# Patient Record
Sex: Female | Born: 1955 | Race: Black or African American | Hispanic: No | Smoking: Never smoker
Health system: Southern US, Community
[De-identification: ages and names within clinical notes are randomized; demographics above are authoritative.]

## PROBLEM LIST (undated history)

## (undated) DIAGNOSIS — M1990 Unspecified osteoarthritis, unspecified site: Secondary | ICD-10-CM

## (undated) DIAGNOSIS — D352 Benign neoplasm of pituitary gland: Secondary | ICD-10-CM

## (undated) DIAGNOSIS — G8929 Other chronic pain: Secondary | ICD-10-CM

## (undated) DIAGNOSIS — D351 Benign neoplasm of parathyroid gland: Secondary | ICD-10-CM

## (undated) DIAGNOSIS — Z803 Family history of malignant neoplasm of breast: Secondary | ICD-10-CM

## (undated) DIAGNOSIS — C73 Malignant neoplasm of thyroid gland: Secondary | ICD-10-CM

## (undated) DIAGNOSIS — E039 Hypothyroidism, unspecified: Secondary | ICD-10-CM

## (undated) DIAGNOSIS — B20 Human immunodeficiency virus [HIV] disease: Secondary | ICD-10-CM

## (undated) DIAGNOSIS — Z860100 Personal history of colon polyps, unspecified: Secondary | ICD-10-CM

## (undated) DIAGNOSIS — K648 Other hemorrhoids: Secondary | ICD-10-CM

## (undated) DIAGNOSIS — E0590 Thyrotoxicosis, unspecified without thyrotoxic crisis or storm: Secondary | ICD-10-CM

## (undated) DIAGNOSIS — Z87442 Personal history of urinary calculi: Secondary | ICD-10-CM

## (undated) DIAGNOSIS — M2550 Pain in unspecified joint: Secondary | ICD-10-CM

## (undated) DIAGNOSIS — F419 Anxiety disorder, unspecified: Secondary | ICD-10-CM

## (undated) DIAGNOSIS — M542 Cervicalgia: Secondary | ICD-10-CM

## (undated) DIAGNOSIS — Z8709 Personal history of other diseases of the respiratory system: Secondary | ICD-10-CM

## (undated) DIAGNOSIS — M549 Dorsalgia, unspecified: Secondary | ICD-10-CM

## (undated) DIAGNOSIS — G43909 Migraine, unspecified, not intractable, without status migrainosus: Secondary | ICD-10-CM

## (undated) DIAGNOSIS — Z21 Asymptomatic human immunodeficiency virus [HIV] infection status: Secondary | ICD-10-CM

## (undated) DIAGNOSIS — D649 Anemia, unspecified: Secondary | ICD-10-CM

## (undated) DIAGNOSIS — IMO0001 Reserved for inherently not codable concepts without codable children: Secondary | ICD-10-CM

## (undated) DIAGNOSIS — I10 Essential (primary) hypertension: Secondary | ICD-10-CM

## (undated) DIAGNOSIS — E785 Hyperlipidemia, unspecified: Secondary | ICD-10-CM

## (undated) DIAGNOSIS — Z531 Procedure and treatment not carried out because of patient's decision for reasons of belief and group pressure: Secondary | ICD-10-CM

## (undated) HISTORY — PX: COLONOSCOPY: SHX174

## (undated) HISTORY — DX: Family history of malignant neoplasm of breast: Z80.3

## (undated) HISTORY — DX: Benign neoplasm of parathyroid gland: D35.1

## (undated) HISTORY — PX: SALPINGOOPHORECTOMY: SHX82

## (undated) HISTORY — PX: BUNIONECTOMY: SHX129

## (undated) HISTORY — PX: TOE SURGERY: SHX1073

## (undated) HISTORY — DX: Human immunodeficiency virus (HIV) disease: B20

## (undated) HISTORY — DX: Malignant neoplasm of thyroid gland: C73

## (undated) HISTORY — DX: Essential (primary) hypertension: I10

## (undated) HISTORY — PX: CYSTOSCOPY: SUR368

## (undated) HISTORY — DX: Hyperlipidemia, unspecified: E78.5

## (undated) HISTORY — DX: Benign neoplasm of pituitary gland: D35.2

## (undated) HISTORY — DX: Asymptomatic human immunodeficiency virus (hiv) infection status: Z21

## (undated) HISTORY — PX: ABDOMINAL HYSTERECTOMY: SHX81

## (undated) HISTORY — PX: TONSILLECTOMY: SUR1361

## (undated) HISTORY — PX: HAMMER TOE SURGERY: SHX385

## (undated) HISTORY — PX: BREAST EXCISIONAL BIOPSY: SUR124

## (undated) HISTORY — PX: TUBAL LIGATION: SHX77

---

## 1997-10-20 ENCOUNTER — Emergency Department (HOSPITAL_COMMUNITY): Admission: EM | Admit: 1997-10-20 | Discharge: 1997-10-20 | Payer: Self-pay | Admitting: Emergency Medicine

## 1997-11-12 ENCOUNTER — Emergency Department (HOSPITAL_COMMUNITY): Admission: EM | Admit: 1997-11-12 | Discharge: 1997-11-12 | Payer: Self-pay | Admitting: Emergency Medicine

## 1998-08-08 ENCOUNTER — Other Ambulatory Visit: Admission: RE | Admit: 1998-08-08 | Discharge: 1998-08-08 | Payer: Self-pay | Admitting: Family Medicine

## 1998-08-29 ENCOUNTER — Ambulatory Visit (HOSPITAL_COMMUNITY): Admission: RE | Admit: 1998-08-29 | Discharge: 1998-08-29 | Payer: Self-pay | Admitting: Family Medicine

## 1999-04-02 ENCOUNTER — Other Ambulatory Visit: Admission: RE | Admit: 1999-04-02 | Discharge: 1999-04-02 | Payer: Self-pay | Admitting: Family Medicine

## 1999-10-07 ENCOUNTER — Encounter: Payer: Self-pay | Admitting: Family Medicine

## 1999-10-07 ENCOUNTER — Ambulatory Visit (HOSPITAL_COMMUNITY): Admission: RE | Admit: 1999-10-07 | Discharge: 1999-10-07 | Payer: Self-pay | Admitting: Family Medicine

## 1999-10-08 ENCOUNTER — Ambulatory Visit (HOSPITAL_COMMUNITY): Admission: RE | Admit: 1999-10-08 | Discharge: 1999-10-08 | Payer: Self-pay | Admitting: Family Medicine

## 1999-10-08 ENCOUNTER — Encounter: Payer: Self-pay | Admitting: Family Medicine

## 1999-11-14 ENCOUNTER — Ambulatory Visit: Admission: RE | Admit: 1999-11-14 | Discharge: 1999-11-14 | Payer: Self-pay | Admitting: Family Medicine

## 1999-11-14 ENCOUNTER — Encounter: Payer: Self-pay | Admitting: Family Medicine

## 1999-11-29 ENCOUNTER — Emergency Department (HOSPITAL_COMMUNITY): Admission: EM | Admit: 1999-11-29 | Discharge: 1999-11-29 | Payer: Self-pay | Admitting: Emergency Medicine

## 1999-12-04 ENCOUNTER — Encounter: Payer: Self-pay | Admitting: Obstetrics and Gynecology

## 1999-12-11 ENCOUNTER — Inpatient Hospital Stay (HOSPITAL_COMMUNITY): Admission: RE | Admit: 1999-12-11 | Discharge: 1999-12-13 | Payer: Self-pay | Admitting: Obstetrics and Gynecology

## 1999-12-11 ENCOUNTER — Encounter (INDEPENDENT_AMBULATORY_CARE_PROVIDER_SITE_OTHER): Payer: Self-pay | Admitting: Specialist

## 2000-04-27 ENCOUNTER — Encounter: Payer: Self-pay | Admitting: Family Medicine

## 2000-04-27 ENCOUNTER — Ambulatory Visit (HOSPITAL_COMMUNITY): Admission: RE | Admit: 2000-04-27 | Discharge: 2000-04-27 | Payer: Self-pay | Admitting: Family Medicine

## 2001-01-11 ENCOUNTER — Ambulatory Visit (HOSPITAL_BASED_OUTPATIENT_CLINIC_OR_DEPARTMENT_OTHER): Admission: RE | Admit: 2001-01-11 | Discharge: 2001-01-11 | Payer: Self-pay

## 2001-08-03 ENCOUNTER — Ambulatory Visit (HOSPITAL_COMMUNITY): Admission: RE | Admit: 2001-08-03 | Discharge: 2001-08-03 | Payer: Self-pay | Admitting: Family Medicine

## 2003-01-31 ENCOUNTER — Ambulatory Visit (HOSPITAL_COMMUNITY): Admission: RE | Admit: 2003-01-31 | Discharge: 2003-01-31 | Payer: Self-pay | Admitting: Internal Medicine

## 2003-09-01 ENCOUNTER — Ambulatory Visit (HOSPITAL_COMMUNITY): Admission: RE | Admit: 2003-09-01 | Discharge: 2003-09-01 | Payer: Self-pay | Admitting: Internal Medicine

## 2003-10-10 ENCOUNTER — Ambulatory Visit: Payer: Self-pay | Admitting: *Deleted

## 2003-10-26 ENCOUNTER — Ambulatory Visit: Payer: Self-pay | Admitting: Family Medicine

## 2003-10-30 ENCOUNTER — Ambulatory Visit (HOSPITAL_COMMUNITY): Admission: RE | Admit: 2003-10-30 | Discharge: 2003-10-30 | Payer: Self-pay | Admitting: Internal Medicine

## 2003-11-21 ENCOUNTER — Ambulatory Visit (HOSPITAL_COMMUNITY): Admission: RE | Admit: 2003-11-21 | Discharge: 2003-11-21 | Payer: Self-pay | Admitting: Internal Medicine

## 2003-12-05 ENCOUNTER — Ambulatory Visit: Payer: Self-pay | Admitting: Family Medicine

## 2003-12-08 ENCOUNTER — Ambulatory Visit: Payer: Self-pay | Admitting: Family Medicine

## 2003-12-19 ENCOUNTER — Ambulatory Visit: Payer: Self-pay | Admitting: Family Medicine

## 2004-02-08 ENCOUNTER — Ambulatory Visit: Payer: Self-pay | Admitting: Family Medicine

## 2004-03-28 ENCOUNTER — Ambulatory Visit: Payer: Self-pay | Admitting: Family Medicine

## 2004-07-02 ENCOUNTER — Ambulatory Visit: Payer: Self-pay | Admitting: Family Medicine

## 2004-08-06 ENCOUNTER — Ambulatory Visit: Payer: Self-pay | Admitting: Family Medicine

## 2004-08-13 ENCOUNTER — Ambulatory Visit: Payer: Self-pay | Admitting: Internal Medicine

## 2004-08-20 ENCOUNTER — Ambulatory Visit: Payer: Self-pay | Admitting: Family Medicine

## 2004-10-28 ENCOUNTER — Ambulatory Visit: Payer: Self-pay | Admitting: Family Medicine

## 2005-02-13 ENCOUNTER — Ambulatory Visit: Payer: Self-pay | Admitting: Family Medicine

## 2005-03-06 ENCOUNTER — Ambulatory Visit: Payer: Self-pay | Admitting: Internal Medicine

## 2005-03-14 ENCOUNTER — Emergency Department (HOSPITAL_COMMUNITY): Admission: EM | Admit: 2005-03-14 | Discharge: 2005-03-14 | Payer: Self-pay | Admitting: Emergency Medicine

## 2005-04-10 ENCOUNTER — Ambulatory Visit: Payer: Self-pay | Admitting: Internal Medicine

## 2005-05-14 ENCOUNTER — Ambulatory Visit: Payer: Self-pay | Admitting: Internal Medicine

## 2005-06-19 ENCOUNTER — Ambulatory Visit: Payer: Self-pay | Admitting: Internal Medicine

## 2005-07-31 ENCOUNTER — Ambulatory Visit: Payer: Self-pay | Admitting: Internal Medicine

## 2005-09-25 ENCOUNTER — Ambulatory Visit: Payer: Self-pay | Admitting: Internal Medicine

## 2005-09-29 ENCOUNTER — Ambulatory Visit (HOSPITAL_COMMUNITY): Admission: RE | Admit: 2005-09-29 | Discharge: 2005-09-29 | Payer: Self-pay | Admitting: Family Medicine

## 2005-10-07 ENCOUNTER — Encounter: Admission: RE | Admit: 2005-10-07 | Discharge: 2005-10-07 | Payer: Self-pay | Admitting: Family Medicine

## 2005-11-24 ENCOUNTER — Ambulatory Visit: Payer: Self-pay | Admitting: Internal Medicine

## 2006-03-09 ENCOUNTER — Ambulatory Visit: Payer: Self-pay | Admitting: Internal Medicine

## 2006-10-19 ENCOUNTER — Ambulatory Visit: Payer: Self-pay | Admitting: Internal Medicine

## 2006-10-19 LAB — CONVERTED CEMR LAB
AST: 15 units/L (ref 0–37)
Absolute CD4: 190 #/uL — ABNORMAL LOW (ref 381–1469)
Albumin: 4.2 g/dL (ref 3.5–5.2)
BUN: 7 mg/dL (ref 6–23)
CO2: 24 meq/L (ref 19–32)
Calcium: 11.4 mg/dL — ABNORMAL HIGH (ref 8.4–10.5)
Chloride: 106 meq/L (ref 96–112)
Cholesterol: 272 mg/dL — ABNORMAL HIGH (ref 0–200)
Creatinine, Ser: 0.72 mg/dL (ref 0.40–1.20)
HDL: 54 mg/dL (ref >39)
HIV 1 RNA Quant: 2880 copies/mL — ABNORMAL HIGH (ref <50)
Hemoglobin: 12.1 g/dL (ref 12.0–15.0)
Lymphocytes Relative: 32 % (ref 12–46)
Lymphs Abs: 0.9 10*3/uL (ref 0.7–3.3)
MCHC: 31.3 g/dL (ref 30.0–36.0)
Monocytes Absolute: 0.3 10*3/uL (ref 0.2–0.7)
Monocytes Relative: 10 % (ref 3–11)
Neutro Abs: 1.5 10*3/uL — ABNORMAL LOW (ref 1.7–7.7)
Neutrophils Relative %: 56 % (ref 43–77)
Potassium: 4.3 meq/L (ref 3.5–5.3)
RBC: 4.23 M/uL (ref 3.87–5.11)
TSH: 0.841 microintl units/mL (ref 0.350–5.50)
Total CHOL/HDL Ratio: 5
Total Lymphocytes %: 32 % (ref 12–46)
Total lymphocyte count: 864 cells/mcL (ref 700–3300)
WBC, lymph enumeration: 2.7 10*3/uL — ABNORMAL LOW (ref 4.0–10.5)
WBC: 2.7 10*3/uL — ABNORMAL LOW (ref 4.0–10.5)

## 2006-11-05 ENCOUNTER — Ambulatory Visit: Payer: Self-pay | Admitting: Internal Medicine

## 2006-11-30 ENCOUNTER — Ambulatory Visit: Payer: Self-pay | Admitting: Internal Medicine

## 2007-01-14 ENCOUNTER — Ambulatory Visit: Payer: Self-pay | Admitting: Family Medicine

## 2007-02-02 ENCOUNTER — Ambulatory Visit: Payer: Self-pay | Admitting: Internal Medicine

## 2007-02-02 LAB — CONVERTED CEMR LAB
ALT: 13 units/L (ref 0–35)
AST: 15 units/L (ref 0–37)
Absolute CD4: 331 #/uL — ABNORMAL LOW (ref 381–1469)
Albumin: 4.4 g/dL (ref 3.5–5.2)
Alkaline Phosphatase: 69 units/L (ref 39–117)
Basophils Absolute: 0 10*3/uL (ref 0.0–0.1)
Eosinophils Absolute: 0.1 10*3/uL (ref 0.0–0.7)
Eosinophils Relative: 2 % (ref 0–5)
HCT: 36.2 % (ref 36.0–46.0)
HIV 1 RNA Quant: 2000 copies/mL — ABNORMAL HIGH (ref <50)
HIV-1 RNA Quant, Log: 3.3 — ABNORMAL HIGH (ref <1.70)
LDL Cholesterol: 170 mg/dL — ABNORMAL HIGH (ref 0–99)
MCV: 94.5 fL (ref 78.0–100.0)
Neutrophils Relative %: 44 % (ref 43–77)
Platelets: 226 10*3/uL (ref 150–400)
Potassium: 5.2 meq/L (ref 3.5–5.3)
RDW: 14.3 % (ref 11.5–15.5)
Sodium: 143 meq/L (ref 135–145)
Total Bilirubin: 0.7 mg/dL (ref 0.3–1.2)
Total Protein: 8.2 g/dL (ref 6.0–8.3)
VLDL: 19 mg/dL (ref 0–40)
WBC, lymph enumeration: 3.5 10*3/uL — ABNORMAL LOW (ref 4.0–10.5)
WBC: 3.5 10*3/uL — ABNORMAL LOW (ref 4.0–10.5)

## 2007-02-11 ENCOUNTER — Ambulatory Visit: Payer: Self-pay | Admitting: Internal Medicine

## 2007-03-16 ENCOUNTER — Emergency Department (HOSPITAL_COMMUNITY): Admission: EM | Admit: 2007-03-16 | Discharge: 2007-03-16 | Payer: Self-pay | Admitting: Family Medicine

## 2007-03-29 ENCOUNTER — Ambulatory Visit: Payer: Self-pay | Admitting: Internal Medicine

## 2007-04-01 ENCOUNTER — Ambulatory Visit (HOSPITAL_COMMUNITY): Admission: RE | Admit: 2007-04-01 | Discharge: 2007-04-01 | Payer: Self-pay | Admitting: Internal Medicine

## 2007-08-12 ENCOUNTER — Ambulatory Visit: Payer: Self-pay | Admitting: Internal Medicine

## 2007-08-12 ENCOUNTER — Encounter: Payer: Self-pay | Admitting: Internal Medicine

## 2007-08-12 LAB — CONVERTED CEMR LAB
Albumin: 4.2 g/dL (ref 3.5–5.2)
Alkaline Phosphatase: 85 units/L (ref 39–117)
BUN: 11 mg/dL (ref 6–23)
Calcium: 10.9 mg/dL — ABNORMAL HIGH (ref 8.4–10.5)
Chloride: 104 meq/L (ref 96–112)
Eosinophils Absolute: 0.1 10*3/uL (ref 0.0–0.7)
Glucose, Bld: 88 mg/dL (ref 70–99)
HCT: 37.9 % (ref 36.0–46.0)
HIV-1 RNA Quant, Log: 3.72 — ABNORMAL HIGH (ref <1.70)
Hemoglobin: 11.9 g/dL — ABNORMAL LOW (ref 12.0–15.0)
Lymphs Abs: 1.2 10*3/uL (ref 0.7–4.0)
MCV: 89.4 fL (ref 78.0–100.0)
Monocytes Absolute: 0.2 10*3/uL (ref 0.1–1.0)
Monocytes Relative: 7 % (ref 3–12)
Neutrophils Relative %: 53 % (ref 43–77)
Potassium: 4.3 meq/L (ref 3.5–5.3)
RBC: 4.24 M/uL (ref 3.87–5.11)
WBC, lymph enumeration: 3.2 10*3/uL — ABNORMAL LOW (ref 4.0–10.5)
WBC: 3.2 10*3/uL — ABNORMAL LOW (ref 4.0–10.5)

## 2007-09-23 ENCOUNTER — Ambulatory Visit: Payer: Self-pay | Admitting: Gynecology

## 2007-09-23 ENCOUNTER — Encounter: Payer: Self-pay | Admitting: Gynecology

## 2007-09-23 ENCOUNTER — Other Ambulatory Visit: Admission: RE | Admit: 2007-09-23 | Discharge: 2007-09-23 | Payer: Self-pay | Admitting: Gynecology

## 2007-09-27 ENCOUNTER — Ambulatory Visit: Payer: Self-pay | Admitting: Gynecology

## 2007-10-01 ENCOUNTER — Ambulatory Visit: Payer: Self-pay | Admitting: Gynecology

## 2007-10-12 ENCOUNTER — Ambulatory Visit: Payer: Self-pay | Admitting: Gynecology

## 2007-10-15 ENCOUNTER — Ambulatory Visit (HOSPITAL_BASED_OUTPATIENT_CLINIC_OR_DEPARTMENT_OTHER): Admission: RE | Admit: 2007-10-15 | Discharge: 2007-10-15 | Payer: Self-pay | Admitting: Gynecology

## 2007-10-15 ENCOUNTER — Encounter: Payer: Self-pay | Admitting: Gynecology

## 2007-10-15 ENCOUNTER — Ambulatory Visit: Payer: Self-pay | Admitting: Gynecology

## 2007-11-01 ENCOUNTER — Ambulatory Visit: Payer: Self-pay | Admitting: Gynecology

## 2007-11-25 ENCOUNTER — Ambulatory Visit: Payer: Self-pay | Admitting: Internal Medicine

## 2008-01-03 ENCOUNTER — Ambulatory Visit: Payer: Self-pay | Admitting: Gynecology

## 2008-01-06 ENCOUNTER — Ambulatory Visit: Payer: Self-pay | Admitting: Internal Medicine

## 2008-01-06 LAB — CONVERTED CEMR LAB
ALT: 18 units/L (ref 0–35)
AST: 20 units/L (ref 0–37)
Absolute CD4: 237 #/uL — ABNORMAL LOW (ref 381–1469)
Basophils Absolute: 0 10*3/uL (ref 0.0–0.1)
Basophils Relative: 1 % (ref 0–1)
Creatinine, Ser: 0.7 mg/dL (ref 0.40–1.20)
Eosinophils Relative: 3 % (ref 0–5)
HCT: 38.4 % (ref 36.0–46.0)
HIV 1 RNA Quant: 5290 copies/mL — ABNORMAL HIGH (ref <48)
Hemoglobin: 12.1 g/dL (ref 12.0–15.0)
MCHC: 31.5 g/dL (ref 30.0–36.0)
Monocytes Absolute: 0.3 10*3/uL (ref 0.1–1.0)
RDW: 13.4 % (ref 11.5–15.5)
Total Bilirubin: 0.4 mg/dL (ref 0.3–1.2)
Total Lymphocytes %: 43 % (ref 12–46)
Total lymphocyte count: 1032 cells/mcL (ref 700–3300)

## 2008-02-10 ENCOUNTER — Telehealth: Payer: Self-pay | Admitting: Internal Medicine

## 2008-04-03 ENCOUNTER — Ambulatory Visit: Payer: Self-pay | Admitting: Internal Medicine

## 2008-04-03 ENCOUNTER — Encounter: Payer: Self-pay | Admitting: Internal Medicine

## 2008-04-03 DIAGNOSIS — D353 Benign neoplasm of craniopharyngeal duct: Secondary | ICD-10-CM

## 2008-04-03 DIAGNOSIS — F329 Major depressive disorder, single episode, unspecified: Secondary | ICD-10-CM

## 2008-04-03 DIAGNOSIS — E785 Hyperlipidemia, unspecified: Secondary | ICD-10-CM | POA: Insufficient documentation

## 2008-04-03 DIAGNOSIS — F3289 Other specified depressive episodes: Secondary | ICD-10-CM | POA: Insufficient documentation

## 2008-04-03 DIAGNOSIS — B20 Human immunodeficiency virus [HIV] disease: Secondary | ICD-10-CM | POA: Insufficient documentation

## 2008-04-03 DIAGNOSIS — B009 Herpesviral infection, unspecified: Secondary | ICD-10-CM | POA: Insufficient documentation

## 2008-04-03 DIAGNOSIS — D352 Benign neoplasm of pituitary gland: Secondary | ICD-10-CM | POA: Insufficient documentation

## 2008-04-03 DIAGNOSIS — R030 Elevated blood-pressure reading, without diagnosis of hypertension: Secondary | ICD-10-CM | POA: Insufficient documentation

## 2008-04-06 LAB — CONVERTED CEMR LAB
ALT: 18 units/L (ref 0–35)
AST: 18 units/L (ref 0–37)
Absolute CD4: 306 #/uL — ABNORMAL LOW (ref 381–1469)
Alkaline Phosphatase: 70 units/L (ref 39–117)
Basophils Absolute: 0 10*3/uL (ref 0.0–0.1)
Basophils Relative: 0 % (ref 0–1)
Chloride: 106 meq/L (ref 96–112)
Creatinine, Ser: 0.91 mg/dL (ref 0.40–1.20)
Eosinophils Absolute: 0 10*3/uL (ref 0.0–0.7)
HIV 1 RNA Quant: 3380 copies/mL — ABNORMAL HIGH (ref <48)
LDL Cholesterol: 181 mg/dL — ABNORMAL HIGH (ref 0–99)
Lymphs Abs: 1.3 10*3/uL (ref 0.7–4.0)
MCV: 88 fL (ref 78.0–100.0)
Neutrophils Relative %: 55 % (ref 43–77)
Platelets: 211 10*3/uL (ref 150–400)
RDW: 13.7 % (ref 11.5–15.5)
Total Bilirubin: 0.3 mg/dL (ref 0.3–1.2)
Total CHOL/HDL Ratio: 4.8
VLDL: 23 mg/dL (ref 0–40)
WBC: 3.6 10*3/uL — ABNORMAL LOW (ref 4.0–10.5)

## 2008-04-19 ENCOUNTER — Telehealth: Payer: Self-pay | Admitting: Internal Medicine

## 2008-07-03 ENCOUNTER — Encounter: Payer: Self-pay | Admitting: Internal Medicine

## 2008-07-03 ENCOUNTER — Ambulatory Visit: Payer: Self-pay | Admitting: Internal Medicine

## 2008-07-03 DIAGNOSIS — N63 Unspecified lump in unspecified breast: Secondary | ICD-10-CM | POA: Insufficient documentation

## 2008-07-05 ENCOUNTER — Encounter: Admission: RE | Admit: 2008-07-05 | Discharge: 2008-07-05 | Payer: Self-pay | Admitting: Internal Medicine

## 2008-07-05 ENCOUNTER — Telehealth: Payer: Self-pay | Admitting: Internal Medicine

## 2008-07-05 LAB — CONVERTED CEMR LAB
ALT: 16 units/L (ref 0–35)
AST: 18 units/L (ref 0–37)
Albumin: 4 g/dL (ref 3.5–5.2)
BUN: 13 mg/dL (ref 6–23)
CD4 T Helper %: 24 % — ABNORMAL LOW (ref 32–62)
CO2: 25 meq/L (ref 19–32)
Calcium: 11.4 mg/dL — ABNORMAL HIGH (ref 8.4–10.5)
Chloride: 107 meq/L (ref 96–112)
Creatinine, Ser: 0.83 mg/dL (ref 0.40–1.20)
Eosinophils Absolute: 0.1 10*3/uL (ref 0.0–0.7)
Eosinophils Relative: 2 % (ref 0–5)
HCT: 36.2 % (ref 36.0–46.0)
HIV 1 RNA Quant: 4300 copies/mL — ABNORMAL HIGH (ref <48)
Lymphocytes Relative: 36 % (ref 12–46)
Lymphs Abs: 1.5 10*3/uL (ref 0.7–4.0)
MCV: 88.7 fL (ref 78.0–100.0)
Monocytes Relative: 9 % (ref 3–12)
Potassium: 4.1 meq/L (ref 3.5–5.3)
RBC: 4.08 M/uL (ref 3.87–5.11)
WBC: 4.1 10*3/uL (ref 4.0–10.5)

## 2008-07-06 ENCOUNTER — Ambulatory Visit (HOSPITAL_COMMUNITY): Admission: RE | Admit: 2008-07-06 | Discharge: 2008-07-06 | Payer: Self-pay | Admitting: Internal Medicine

## 2008-07-19 ENCOUNTER — Encounter: Payer: Self-pay | Admitting: Internal Medicine

## 2008-07-27 ENCOUNTER — Encounter: Payer: Self-pay | Admitting: Internal Medicine

## 2008-07-27 ENCOUNTER — Ambulatory Visit: Payer: Self-pay | Admitting: Internal Medicine

## 2008-07-27 DIAGNOSIS — I10 Essential (primary) hypertension: Secondary | ICD-10-CM | POA: Insufficient documentation

## 2008-07-28 ENCOUNTER — Encounter: Payer: Self-pay | Admitting: Internal Medicine

## 2008-08-07 ENCOUNTER — Telehealth (INDEPENDENT_AMBULATORY_CARE_PROVIDER_SITE_OTHER): Payer: Self-pay | Admitting: *Deleted

## 2008-08-07 ENCOUNTER — Ambulatory Visit: Payer: Self-pay | Admitting: Internal Medicine

## 2008-09-25 ENCOUNTER — Encounter: Payer: Self-pay | Admitting: Internal Medicine

## 2008-09-25 ENCOUNTER — Ambulatory Visit: Payer: Self-pay | Admitting: Internal Medicine

## 2008-09-25 LAB — CONVERTED CEMR LAB
ALT: 15 units/L (ref 0–35)
Absolute CD4: 273 #/uL — ABNORMAL LOW (ref 381–1469)
BUN: 11 mg/dL (ref 6–23)
Basophils Relative: 1 % (ref 0–1)
CO2: 26 meq/L (ref 19–32)
Calcium: 10.7 mg/dL — ABNORMAL HIGH (ref 8.4–10.5)
Chloride: 108 meq/L (ref 96–112)
Cholesterol: 233 mg/dL — ABNORMAL HIGH (ref 0–200)
Creatinine, Ser: 0.83 mg/dL (ref 0.40–1.20)
Eosinophils Absolute: 0.1 10*3/uL (ref 0.0–0.7)
Eosinophils Relative: 2 % (ref 0–5)
Glucose, Bld: 84 mg/dL (ref 70–99)
HCT: 36.7 % (ref 36.0–46.0)
HIV-1 RNA Quant, Log: 3.67 — ABNORMAL HIGH (ref <1.68)
Lymphs Abs: 1.2 10*3/uL (ref 0.7–4.0)
MCHC: 31.1 g/dL (ref 30.0–36.0)
MCV: 90 fL (ref 78.0–100.0)
Monocytes Absolute: 0.3 10*3/uL (ref 0.1–1.0)
Monocytes Relative: 10 % (ref 3–12)
Neutrophils Relative %: 48 % (ref 43–77)
RBC: 4.08 M/uL (ref 3.87–5.11)
Total Bilirubin: 0.3 mg/dL (ref 0.3–1.2)
Total CHOL/HDL Ratio: 5.4
Triglycerides: 85 mg/dL (ref <150)
VLDL: 17 mg/dL (ref 0–40)
WBC: 3.1 10*3/uL — ABNORMAL LOW (ref 4.0–10.5)

## 2008-09-27 ENCOUNTER — Encounter: Payer: Self-pay | Admitting: Internal Medicine

## 2008-09-28 ENCOUNTER — Telehealth: Payer: Self-pay | Admitting: Internal Medicine

## 2008-12-25 ENCOUNTER — Ambulatory Visit: Payer: Self-pay | Admitting: Internal Medicine

## 2008-12-25 ENCOUNTER — Encounter: Payer: Self-pay | Admitting: Internal Medicine

## 2008-12-27 LAB — CONVERTED CEMR LAB
ALT: 14 units/L (ref 0–35)
AST: 16 units/L (ref 0–37)
Absolute CD4: 276 #/uL — ABNORMAL LOW (ref 381–1469)
Albumin: 3.9 g/dL (ref 3.5–5.2)
CD4 T Helper %: 20 % — ABNORMAL LOW (ref 32–62)
CO2: 27 meq/L (ref 19–32)
Calcium: 11.5 mg/dL — ABNORMAL HIGH (ref 8.4–10.5)
Chloride: 107 meq/L (ref 96–112)
Eosinophils Relative: 2 % (ref 0–5)
HCT: 38.1 % (ref 36.0–46.0)
HIV 1 RNA Quant: 6050 copies/mL — ABNORMAL HIGH (ref <48)
HIV-1 RNA Quant, Log: 3.78 — ABNORMAL HIGH (ref <1.68)
Lymphocytes Relative: 46 % (ref 12–46)
Lymphs Abs: 1.4 10*3/uL (ref 0.7–4.0)
Monocytes Relative: 11 % (ref 3–12)
Neutrophils Relative %: 40 % — ABNORMAL LOW (ref 43–77)
Platelets: 205 10*3/uL (ref 150–400)
Potassium: 5.4 meq/L — ABNORMAL HIGH (ref 3.5–5.3)
RBC: 4.22 M/uL (ref 3.87–5.11)
Total lymphocyte count: 1380 cells/mcL (ref 700–3300)
WBC: 3 10*3/uL — ABNORMAL LOW (ref 4.0–10.5)

## 2008-12-28 ENCOUNTER — Telehealth: Payer: Self-pay | Admitting: Internal Medicine

## 2009-01-08 ENCOUNTER — Encounter: Payer: Self-pay | Admitting: Internal Medicine

## 2009-01-08 ENCOUNTER — Ambulatory Visit: Payer: Self-pay | Admitting: Internal Medicine

## 2009-01-08 DIAGNOSIS — J069 Acute upper respiratory infection, unspecified: Secondary | ICD-10-CM | POA: Insufficient documentation

## 2009-01-11 ENCOUNTER — Encounter: Admission: RE | Admit: 2009-01-11 | Discharge: 2009-01-11 | Payer: Self-pay | Admitting: Internal Medicine

## 2009-05-18 ENCOUNTER — Ambulatory Visit: Payer: Self-pay | Admitting: Internal Medicine

## 2009-05-18 LAB — CONVERTED CEMR LAB
AST: 23 units/L (ref 0–37)
Albumin: 4.1 g/dL (ref 3.5–5.2)
Alkaline Phosphatase: 101 units/L (ref 39–117)
BUN: 11 mg/dL (ref 6–23)
Basophils Absolute: 0 10*3/uL (ref 0.0–0.1)
Eosinophils Relative: 2 % (ref 0–5)
HDL: 45 mg/dL (ref >39)
LDL Cholesterol: 173 mg/dL — ABNORMAL HIGH (ref 0–99)
Lymphocytes Relative: 36 % (ref 12–46)
Neutro Abs: 2.5 10*3/uL (ref 1.7–7.7)
Neutrophils Relative %: 55 % (ref 43–77)
Platelets: 249 10*3/uL (ref 150–400)
Potassium: 4.3 meq/L (ref 3.5–5.3)
RDW: 13.4 % (ref 11.5–15.5)
Total CHOL/HDL Ratio: 5.3

## 2009-05-30 ENCOUNTER — Encounter (INDEPENDENT_AMBULATORY_CARE_PROVIDER_SITE_OTHER): Payer: Self-pay | Admitting: *Deleted

## 2009-05-30 ENCOUNTER — Ambulatory Visit: Payer: Self-pay | Admitting: Internal Medicine

## 2009-07-30 ENCOUNTER — Encounter: Admission: RE | Admit: 2009-07-30 | Discharge: 2009-07-30 | Payer: Self-pay | Admitting: Internal Medicine

## 2009-08-01 ENCOUNTER — Encounter: Payer: Self-pay | Admitting: Internal Medicine

## 2009-08-07 ENCOUNTER — Encounter: Payer: Self-pay | Admitting: Internal Medicine

## 2009-08-22 ENCOUNTER — Ambulatory Visit: Payer: Self-pay | Admitting: Internal Medicine

## 2009-08-22 LAB — CONVERTED CEMR LAB
AST: 22 units/L (ref 0–37)
Albumin: 4.2 g/dL (ref 3.5–5.2)
Alkaline Phosphatase: 69 units/L (ref 39–117)
Basophils Absolute: 0 10*3/uL (ref 0.0–0.1)
Basophils Relative: 1 % (ref 0–1)
Eosinophils Absolute: 0.2 10*3/uL (ref 0.0–0.7)
MCHC: 32 g/dL (ref 30.0–36.0)
MCV: 89 fL (ref 78.0–100.0)
Neutrophils Relative %: 40 % — ABNORMAL LOW (ref 43–77)
Platelets: 199 10*3/uL (ref 150–400)
Potassium: 4.4 meq/L (ref 3.5–5.3)
Sodium: 138 meq/L (ref 135–145)
Total Protein: 7.7 g/dL (ref 6.0–8.3)
WBC: 2.8 10*3/uL — ABNORMAL LOW (ref 4.0–10.5)

## 2009-09-05 ENCOUNTER — Telehealth: Payer: Self-pay | Admitting: Internal Medicine

## 2009-09-07 ENCOUNTER — Telehealth (INDEPENDENT_AMBULATORY_CARE_PROVIDER_SITE_OTHER): Payer: Self-pay | Admitting: *Deleted

## 2009-09-09 ENCOUNTER — Emergency Department (HOSPITAL_COMMUNITY)
Admission: EM | Admit: 2009-09-09 | Discharge: 2009-09-09 | Payer: Self-pay | Source: Home / Self Care | Admitting: Emergency Medicine

## 2009-09-24 ENCOUNTER — Ambulatory Visit: Payer: Self-pay | Admitting: Internal Medicine

## 2009-09-24 ENCOUNTER — Ambulatory Visit (HOSPITAL_COMMUNITY): Admission: RE | Admit: 2009-09-24 | Discharge: 2009-09-24 | Payer: Self-pay | Admitting: Internal Medicine

## 2009-09-24 DIAGNOSIS — M79609 Pain in unspecified limb: Secondary | ICD-10-CM | POA: Insufficient documentation

## 2009-09-27 ENCOUNTER — Encounter: Payer: Self-pay | Admitting: Internal Medicine

## 2009-09-27 DIAGNOSIS — M899 Disorder of bone, unspecified: Secondary | ICD-10-CM | POA: Insufficient documentation

## 2009-09-27 DIAGNOSIS — M949 Disorder of cartilage, unspecified: Secondary | ICD-10-CM

## 2009-10-03 ENCOUNTER — Encounter: Admission: RE | Admit: 2009-10-03 | Discharge: 2009-10-03 | Payer: Self-pay | Admitting: Internal Medicine

## 2009-10-03 ENCOUNTER — Telehealth: Payer: Self-pay | Admitting: Internal Medicine

## 2009-10-03 ENCOUNTER — Encounter: Payer: Self-pay | Admitting: Internal Medicine

## 2009-11-08 ENCOUNTER — Ambulatory Visit: Payer: Self-pay | Admitting: Internal Medicine

## 2009-11-08 ENCOUNTER — Telehealth (INDEPENDENT_AMBULATORY_CARE_PROVIDER_SITE_OTHER): Payer: Self-pay | Admitting: *Deleted

## 2009-11-08 LAB — CONVERTED CEMR LAB
Alkaline Phosphatase: 87 units/L (ref 39–117)
Creatinine, Ser: 0.9 mg/dL (ref 0.40–1.20)
Eosinophils Absolute: 0.1 10*3/uL (ref 0.0–0.7)
Eosinophils Relative: 3 % (ref 0–5)
Glucose, Bld: 99 mg/dL (ref 70–99)
HCT: 37.4 % (ref 36.0–46.0)
HDL: 47 mg/dL (ref >39)
LDL Cholesterol: 182 mg/dL — ABNORMAL HIGH (ref 0–99)
Lymphs Abs: 1.6 10*3/uL (ref 0.7–4.0)
MCV: 88.8 fL (ref 78.0–100.0)
Monocytes Absolute: 0.3 10*3/uL (ref 0.1–1.0)
Platelets: 272 10*3/uL (ref 150–400)
RDW: 14 % (ref 11.5–15.5)
Sodium: 142 meq/L (ref 135–145)
Total Bilirubin: 0.3 mg/dL (ref 0.3–1.2)
Total CHOL/HDL Ratio: 5.3
Total Protein: 7.9 g/dL (ref 6.0–8.3)
Triglycerides: 103 mg/dL (ref <150)
VLDL: 21 mg/dL (ref 0–40)

## 2009-11-09 ENCOUNTER — Telehealth (INDEPENDENT_AMBULATORY_CARE_PROVIDER_SITE_OTHER): Payer: Self-pay | Admitting: *Deleted

## 2009-11-09 ENCOUNTER — Encounter: Payer: Self-pay | Admitting: Internal Medicine

## 2009-11-26 ENCOUNTER — Ambulatory Visit: Payer: Self-pay | Admitting: Internal Medicine

## 2010-01-10 ENCOUNTER — Encounter: Payer: Self-pay | Admitting: Internal Medicine

## 2010-01-10 ENCOUNTER — Ambulatory Visit
Admission: RE | Admit: 2010-01-10 | Discharge: 2010-01-10 | Payer: Self-pay | Source: Home / Self Care | Attending: Internal Medicine | Admitting: Internal Medicine

## 2010-01-10 LAB — CONVERTED CEMR LAB
Alkaline Phosphatase: 114 units/L (ref 39–117)
BUN: 13 mg/dL (ref 6–23)
Basophils Relative: 1 % (ref 0–1)
CO2: 28 meq/L (ref 19–32)
Creatinine, Ser: 0.84 mg/dL (ref 0.40–1.20)
Eosinophils Absolute: 0.1 10*3/uL (ref 0.0–0.7)
Glucose, Bld: 118 mg/dL — ABNORMAL HIGH (ref 70–99)
HCT: 38.5 % (ref 36.0–46.0)
HIV 1 RNA Quant: <20 copies/mL (ref <20)
Hemoglobin: 12.6 g/dL (ref 12.0–15.0)
LDL Cholesterol: 172 mg/dL — ABNORMAL HIGH (ref 0–99)
Lymphs Abs: 1.3 10*3/uL (ref 0.7–4.0)
MCHC: 32.7 g/dL (ref 30.0–36.0)
MCV: 90.6 fL (ref 78.0–100.0)
Monocytes Absolute: 0.2 10*3/uL (ref 0.1–1.0)
Monocytes Relative: 7 % (ref 3–12)
RBC: 4.25 M/uL (ref 3.87–5.11)
Sodium: 139 meq/L (ref 135–145)
Total Bilirubin: 0.3 mg/dL (ref 0.3–1.2)
Triglycerides: 107 mg/dL (ref <150)
VLDL: 21 mg/dL (ref 0–40)
WBC: 3.2 10*3/uL — ABNORMAL LOW (ref 4.0–10.5)

## 2010-01-11 LAB — T-HELPER CELL (CD4) - (RCID CLINIC ONLY)
CD4 % Helper T Cell: 25 % — ABNORMAL LOW (ref 33–55)
CD4 T Cell Abs: 390 uL — ABNORMAL LOW (ref 400–2700)

## 2010-01-24 ENCOUNTER — Ambulatory Visit
Admission: RE | Admit: 2010-01-24 | Discharge: 2010-01-24 | Payer: Self-pay | Source: Home / Self Care | Attending: Internal Medicine | Admitting: Internal Medicine

## 2010-01-26 ENCOUNTER — Encounter: Payer: Self-pay | Admitting: Internal Medicine

## 2010-01-26 ENCOUNTER — Encounter: Payer: Self-pay | Admitting: Family Medicine

## 2010-02-05 NOTE — Miscellaneous (Signed)
Summary: Office Visit ([facility])    Vital Signs:  Patient profile:   55 year old female Weight:      144.9 pounds Temp:     98.0 degrees F oral Pulse rate:   65 / minute Pulse rhythm:   regular Resp:     20 per minute BP sitting:   163 / 88  (left arm)  Vitals Entered By: Sharen Heck RN (January 08, 2009 3:54 PM)  History of Present Illness: Pt c/o head congestion and ear congestion and feeling dizzy.  She has tried OTC meds without much help. No fever or chills. No cough or sore throat.  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 1 Tablet Every Day 2)  Naproxen 500 Mg Tabs (Naproxen) .... Take 1 Tablet By Mouth Two Times A Day As Needed 3)  Tetracycline Hcl 500 Mg Caps (Tetracycline Hcl) .... Take 1 Capsule By Mouth Two Times A Day 4)  Augmentin 875-125 Mg Tabs (Amoxicillin-Pot Clavulanate) .... Take 1 Tablet By Mouth Two Times A Day  Allergies: 1)  ! Sulfa   Review of Systems  The patient denies anorexia, fever, weight loss, dyspnea on exertion, prolonged cough, and hemoptysis.     Physical Exam  General:  alert, well-developed, well-nourished, and well-hydrated.   Head:  normocephalic and atraumatic.   Ears:  bilateral TMs dull Mouth:  pharynx pink and moist.     Impression & Recommendations:  Problem # 1:  ACUTE SINUSITIS, UNSPECIFIED (ICD-461.9)  Will treat with augmentin. Her updated medication list for this problem includes:        Augmentin 875-125 Mg Tabs (Amoxicillin-pot clavulanate) .Marland Kitchen... Take 1 tablet by mouth two times a day  Orders: Est. Patient Level III  ([address])  Problem # 2:  HIV DISEASE (ICD-042) Pt currently does not want to be on antiretroviral medication. Diagnostics Reviewed:  WBC: 3.0 (12/25/2008)   Hgb: 12.5 (12/25/2008)   HCT: 38.1 (12/25/2008)   Platelets: 205 (12/25/2008) HIV-1 RNA: 6050 (12/25/2008)     Problem # 3:  HYPERLIPIDEMIA (ICD-272.4) pt refuses treatment.  Medications Added to Medication List This Visit: 1)  Augmentin 875-125 Mg Tabs (Amoxicillin-pot clavulanate) .... Take 1 tablet by mouth two times a day  Prescriptions: AUGMENTIN 875-125 MG TABS (AMOXICILLIN-POT CLAVULANATE) Take 1 tablet by mouth two times a day  #20 x 0   Entered and Authorized by:   Yisroel Ramming MD   Signed by:   Yisroel Ramming MD on 01/08/2009   Method used:   Print then Give to Patient   RxID:   [id number]

## 2010-02-05 NOTE — Assessment & Plan Note (Signed)
Summary: sinus infections & lab [mkj]   CC:  c/o sinus and head congestion, fever three days ago, and B/P has been elevated.  History of Present Illness: Pt c/o sinus congestion and ear pain for several days. She felt feverish and is having some nausea due to the drainage. She is taking sudafed without relief. She stopped taking her BP medication because it was making her feel tired.  Her Mom takes HCTZ so she would like to try that. I warned her that because she is sulfa allergic that she may react to the HCTZ.  Preventive Screening-Counseling & Management  Alcohol-Tobacco     Alcohol drinks/day: 0     Smoking Status: never  Caffeine-Diet-Exercise     Caffeine use/day: 1     Does Patient Exercise: yes     Type of exercise: cardio     Exercise (avg: min/session): <30     Times/week: 4  Safety-Violence-Falls     Seat Belt Use: yes      Sexual History:  n/a.    Comments: pt declined condoms   Updated Prior Medication List: NAPROXEN 500 MG TABS (NAPROXEN) Take 1 tablet by mouth two times a day as needed AMOXICILLIN 500 MG CAPS (AMOXICILLIN) Take 1 capsule by mouth three times a day HYDROCHLOROTHIAZIDE 25 MG TABS (HYDROCHLOROTHIAZIDE) Take 1 tablet by mouth once a day PROMETHAZINE HCL 25 MG TABS (PROMETHAZINE HCL) Take 1 tablet by mouth every 8 hours as needed  Current Allergies (reviewed today): ! SULFA Past History:  Past Medical History: Last updated: 04/03/2008 Depression HIV disease Hyperlipidemia  Social History: Sexual History:  n/a  Review of Systems  The patient denies anorexia, weight loss, dyspnea on exertion, and hemoptysis.    Vital Signs:  Patient profile:   55 year old female Height:      65 inches (165.10 cm) Weight:      142.12 pounds (64.60 kg) BMI:     23.74 Temp:     98.1 degrees F (36.72 degrees C) oral Pulse rate:   72 / minute BP sitting:   165 / 96  (right arm)  Vitals Entered By: Wendall Mola CMA Duncan Dull) (May 18, 2009 10:27  AM) CC: c/o sinus and head congestion, fever three days ago, B/P has been elevated Is Patient Diabetic? No Pain Assessment Patient in pain? yes     Location: head Intensity: 6 Type: pressure Onset of pain  Constant Nutritional Status BMI of 19 -24 = normal Nutritional Status Detail appetite "good"  Have you ever been in a relationship where you felt threatened, hurt or afraid?No   Does patient need assistance? Functional Status Self care Ambulation Normal   Physical Exam  General:  alert, well-developed, well-nourished, and well-hydrated.   Head:  normocephalic and atraumatic.   Ears:  R ear normal and L ear normal.   Mouth:  pharynx pink and moist, no erythema, and no exudates.   Lungs:  normal breath sounds.      Impression & Recommendations:  Problem # 1:  HIV DISEASE (ICD-042) Pt currently not on therapy per her wish. We will repeat labs today and call her with results. She will return in 3 months. Diagnostics Reviewed:  WBC: 3.0 (12/25/2008)   Hgb: 12.5 (12/25/2008)   HCT: 38.1 (12/25/2008)   Platelets: 205 (12/25/2008) HIV-1 RNA: 6050 (12/25/2008)     Problem # 2:  ACUTE SINUSITIS, UNSPECIFIED (ICD-461.9) Will treat with amoxacillin. Pt to avoid decongestants due to her HTN.  Problem # 3:  ESSENTIAL HYPERTENSION, BENIGN (ICD-401.1) Will try HCTZ but pt warned about the potential for allergic reaction. The following medications were removed from the medication list:    Atenolol 100 Mg Tabs (Atenolol) .Marland Kitchen... 1 tablet every day Her updated medication list for this problem includes:    Hydrochlorothiazide 25 Mg Tabs (Hydrochlorothiazide) .Marland Kitchen... Take 1 tablet by mouth once a day  Medications Added to Medication List This Visit: 1)  Amoxicillin 500 Mg Caps (Amoxicillin) .... Take 1 capsule by mouth three times a day 2)  Hydrochlorothiazide 25 Mg Tabs (Hydrochlorothiazide) .... Take 1 tablet by mouth once a day 3)  Promethazine Hcl 25 Mg Tabs (Promethazine hcl) ....  Take 1 tablet by mouth every 8 hours as needed  Other Orders: T-CD4SP ([facility]) (CD4SP) T-HIV Viral Load [phone]) T-Comprehensive Metabolic Panel [id number]) T-CBC w/Diff ([phone]) T-Lipid Profile [phone]) T-RPR (Syphilis) (02585-27782) Est. Patient Level III (423[address])  Patient Instructions: 1)  Please schedule a follow-up appointment in 3 months.  Prescriptions: PROMETHAZINE HCL 25 MG TABS (PROMETHAZINE HCL) Take 1 tablet by mouth every 8 hours as needed  #30 x 0   Entered and Authorized by:   Yisroel Ramming MD   Signed by:   Yisroel Ramming MD on 05/18/2009   Method used:   Print then Give to Patient   RxID:   [id number] AMOXICILLIN 500 MG CAPS (AMOXICILLIN) Take 1 capsule by mouth three times a day  #30 x 0   Entered and Authorized by:   Yisroel Ramming MD   Signed by:   Yisroel Ramming MD on 05/18/2009   Method used:   Print then Give to Patient   RxID:   [id number] HYDROCHLOROTHIAZIDE 25 MG TABS (HYDROCHLOROTHIAZIDE) Take 1 tablet by mouth once a day  #30 x 5   Entered and Authorized by:   Yisroel Ramming MD   Signed by:   Yisroel Ramming MD on 05/18/2009   Method used:   Print then Give to Patient   RxID:   [id number]

## 2010-02-05 NOTE — Miscellaneous (Signed)
Summary: RW Update  Clinical Lists Changes  Observations: Added new observation of DATE1STVISIT: 05/30/2009 (08/01/2009 11:56)

## 2010-02-05 NOTE — Miscellaneous (Signed)
Summary: HIPAA Restrictions  HIPAA Restrictions   Imported By: Florinda Marker 05/18/2009 15:21:53  _____________________________________________________________________  External Attachment:    Type:   Image     Comment:   External Document

## 2010-02-05 NOTE — Assessment & Plan Note (Signed)
Summary: Laurie Gray FROM 11/17 F/U [MKJ]   CC:  follow-up visit, lab results, c/o productive cough not improved in 3 months, insomnia, HA, and muscle pain.  History of Present Illness: patient here for follow-up on her lab results.  Patient has been taking her eye center is entry [facility] but complains of headaches and bodyaches.  She also complains of insomnia.  She continues to have some chest congestion and cough mostly during the morning and a cough that is sometimes productive.  She would like to try a different HIV regimen because of the headaches that she has been experiencing with this current regimen.  She would like try that atripla since it's just one pill once a day.  Preventive Screening-Counseling & Management  Alcohol-Tobacco     Alcohol drinks/day: 0     Smoking Status: never  Caffeine-Diet-Exercise     Caffeine use/day: 1     Does Patient Exercise: yes     Type of exercise: cardio     Exercise (avg: min/session): <30     Times/week: 4  Safety-Violence-Falls     Seat Belt Use: yes      Sexual History:  n/a.     Updated Prior Medication List: NAPROXEN 500 MG TABS (NAPROXEN) Take 1 tablet by mouth two times a day as needed HYDROCHLOROTHIAZIDE 25 MG TABS (HYDROCHLOROTHIAZIDE) Take 1 tablet by mouth once a day PROMETHAZINE HCL 25 MG TABS (PROMETHAZINE HCL) Take 1 tablet by mouth every 8 hours as needed PRAVASTATIN SODIUM 20 MG TABS (PRAVASTATIN SODIUM) Take 1 tablet by mouth once a day VICODIN 5-500 MG TABS (HYDROCODONE-ACETAMINOPHEN) Take 1 tablet by mouth every 8 hours as needed ATRIPLA 600-200-300 MG TABS (EFAVIRENZ-EMTRICITAB-TENOFOVIR) Take 1 tablet by mouth at bedtime  Current Allergies (reviewed today): ! SULFA Past History:  Past Medical History: Last updated: 04/03/2008 Depression HIV disease Hyperlipidemia  Review of Systems       The patient complains of headaches.  The patient denies anorexia, fever, and weight loss.    Vital Signs:  Patient profile:   55  year old female Height:      65 inches (165.10 cm) Weight:      144.0 pounds (65.45 kg) BMI:     24.05 Temp:     98.1 degrees F (36.72 degrees C) oral Pulse rate:   58 / minute BP sitting:   160 / 88  (right arm)  Vitals Entered By: Wendall Mola CMA Duncan Dull) (November 26, 2009 3:37 PM) CC: follow-up visit, lab results, c/o productive cough not improved in 3 months, insomnia, HA, muscle pain Is Patient Diabetic? No Pain Assessment Patient in pain? yes     Location: head Intensity: 5 Type: aching Onset of pain  Constant Nutritional Status BMI of 19 -24 = normal Nutritional Status Detail appetite "good"  Have you ever been in a relationship where you felt threatened, hurt or afraid?No   Does patient need assistance? Functional Status Self care Ambulation Normal Comments one missed dose of meds per pt.   Physical Exam  General:  alert, well-developed, well-nourished, and well-hydrated.   Head:  normocephalic and atraumatic.   Mouth:  pharynx pink and moist.   Lungs:  normal breath sounds.      Impression & Recommendations:  Problem # 1:  HIV DISEASE (ICD-042) Goodresponse to treatment but pt having side effects.unfortunately I think patient will have side effects to almost any regimen that I put her on.  I will try her on atripla while.  Potential side  effects were discussed.  She will follow-up in 6 weeks for repeat labs.  She will call me if she has any significant side effects. Diagnostics Reviewed:  CD4: 300 (11/09/2009)   WBC: 4.1 (11/08/2009)   Hgb: 12.3 (11/08/2009)   HCT: 37.4 (11/08/2009)   Platelets: 272 (11/08/2009) HIV-1 RNA: <20 copies/mL (11/08/2009)     Problem # 2:  HYPERLIPIDEMIA (ICD-272.4) patient states that she will get back on her pravastatin for her hyperlipidemia. Her updated medication list for this problem includes:    Pravastatin Sodium 20 Mg Tabs (Pravastatin sodium) .Marland Kitchen... Take 1 tablet by mouth once a day  Medications Added to  Medication List This Visit: 1)  Atripla 600-200-300 Mg Tabs (Efavirenz-emtricitab-tenofovir) .... Take 1 tablet by mouth at bedtime  Other Orders: Est. Patient Level III ([address]) Future Orders: T-CD4SP ([facility]) (CD4SP) ... 01/07/2010 T-HIV Viral Load [phone]) ... 01/07/2010 T-Comprehensive Metabolic Panel [phone]) ... 01/07/2010 T-CBC w/Diff ([id number]) ... 01/07/2010  Patient Instructions: 1)  Please schedule a follow-up appointment in 8 weeks, 2 weeks after labs.  Prescriptions: ATRIPLA 600-200-300 MG TABS (EFAVIRENZ-EMTRICITAB-TENOFOVIR) Take 1 tablet by mouth at bedtime  #30 x 5   Entered and Authorized by:   Yisroel Ramming MD   Signed by:   Yisroel Ramming MD on 11/26/2009   Method used:   Print then Give to Patient   RxID:   [id number]

## 2010-02-05 NOTE — Letter (Signed)
[facility]   Imported By: Florinda Marker 09/24/2009 16:29:15  _____________________________________________________________________  External Attachment:    Type:   Image     Comment:   External Document

## 2010-02-05 NOTE — Progress Notes (Signed)
Summary: letter to [employer]   Call placed by: Annice Pih Summary of Call: Letter faxed to [employer], [place], stating  that exercise is beneficial to pts. health Initial call taken by: Wendall Mola CMA Duncan Dull),  November 09, 2009 4:45 PM

## 2010-02-05 NOTE — Miscellaneous (Signed)
Summary: Orders Update  Clinical Lists Changes  Problems: Added new problem of OSTEOPENIA (ICD-733.90) Orders: Added new Test order of Dexa scan (Dexa scan) - Signed

## 2010-02-05 NOTE — Assessment & Plan Note (Signed)
Summary: [facility] FROM 8/31 F/U [MKJ]   CC:  follow-up visit, lab results, would like new RXs to change pharmacies, seen in urgent care 09/09/09, and still having cough and congestion.  History of Present Illness: Pt went to ED with cough and was diagnosed with bronchitis. She was given some doxycycline. She still has some congestion but feels a little better.She also c/o some right hand pain which makes it diffictult to type. She thinks that it is time to get on HIV meds because she continues to get recurrent infections but is wary of the side effects.  She took meds a long time ago and had horrible side efects. She is also here to get lab results.  Preventive Screening-Counseling & Management  Alcohol-Tobacco     Alcohol drinks/day: 0     Smoking Status: never  Caffeine-Diet-Exercise     Caffeine use/day: 1     Does Patient Exercise: yes     Type of exercise: cardio     Exercise (avg: min/session): <30     Times/week: 4  Safety-Violence-Falls     Seat Belt Use: yes      Sexual History:  n/a.    Comments: pt. declined condoms   Updated Prior Medication List: NAPROXEN 500 MG TABS (NAPROXEN) Take 1 tablet by mouth two times a day as needed HYDROCHLOROTHIAZIDE 25 MG TABS (HYDROCHLOROTHIAZIDE) Take 1 tablet by mouth once a day PROMETHAZINE HCL 25 MG TABS (PROMETHAZINE HCL) Take 1 tablet by mouth every 8 hours as needed PRAVASTATIN SODIUM 20 MG TABS (PRAVASTATIN SODIUM) Take 1 tablet by mouth once a day TRUVADA 200-300 MG TABS (EMTRICITABINE-TENOFOVIR) Take 1 tablet by mouth once a day ISENTRESS 400 MG TABS (RALTEGRAVIR POTASSIUM) Take 1 tablet by mouth two times a day  Current Allergies (reviewed today): ! SULFA Past History:  Past Medical History: Last updated: 04/03/2008 Depression HIV disease Hyperlipidemia  Review of Systems  The patient denies anorexia, fever, weight loss, dyspnea on exertion, and hemoptysis.    Vital Signs:  Patient profile:   55 year old  female Height:      65 inches (165.10 cm) Weight:      139.4 pounds (63.36 kg) BMI:     23.28 Temp:     98.4 degrees F (36.89 degrees C) oral Pulse rate:   63 / minute BP sitting:   149 / 86  (right arm)  Vitals Entered By: Wendall Mola CMA Duncan Dull) (September 24, 2009 9:24 AM) CC: follow-up visit, lab results, would like new RXs to change pharmacies, seen in urgent care 09/09/09, still having cough and congestion Is Patient Diabetic? No Pain Assessment Patient in pain? no      Nutritional Status BMI of 19 -24 = normal Nutritional Status Detail appetite "OK"  Have you ever been in a relationship where you felt threatened, hurt or afraid?No   Does patient need assistance? Functional Status Self care Ambulation Normal Comments pt. has missed a few doses of B/P meds over the past month, has not started provastatin   Physical Exam  General:  alert, well-developed, well-nourished, and well-hydrated.   Head:  normocephalic and atraumatic.   Mouth:  pharynx pink and moist.   Lungs:  normal breath sounds.   Msk:  some joint swelling in right 5 th finger  no erythema or warmth    Impression & Recommendations:  Problem # 1:  HIV DISEASE (ICD-042) Discussed treatment options. Pt agreed to start Truvada and Isentress. Potential side effects discussed. Pt will return  in 6 weeks for labs. Influenza vaccine given. Orders: Est. Patient Level IV ([address])  Diagnostics Reviewed:  CD4: 220 (08/23/2009)   WBC: 2.8 (08/22/2009)   Hgb: 11.1 (08/22/2009)   HCT: 34.7 (08/22/2009)   Platelets: 199 (08/22/2009) HIV-1 RNA: 11800 (08/22/2009)     Problem # 2:  HAND PAIN, RIGHT (ICD-729.5) check x-ray most likely due to arthrtitis Orders: Diagnostic X-Ray/Fluoroscopy (Diagnostic X-Ray/Flu)  Problem # 3:  HYPERLIPIDEMIA (ICD-272.4) will check next visit pt has been reluctant to take her pravastatin because she does not like taking meds. Her updated medication list for this problem  includes:    Pravastatin Sodium 20 Mg Tabs (Pravastatin sodium) .Marland Kitchen... Take 1 tablet by mouth once a day  Problem # 4:  ESSENTIAL HYPERTENSION, BENIGN (ICD-401.1) stable Her updated medication list for this problem includes:    Hydrochlorothiazide 25 Mg Tabs (Hydrochlorothiazide) .Marland Kitchen... Take 1 tablet by mouth once a day  Problem # 5:  Hx of PITUITARY ADENOMA, BENIGN (ICD-227.3) stable  Medications Added to Medication List This Visit: 1)  Truvada 200-300 Mg Tabs (Emtricitabine-tenofovir) .... Take 1 tablet by mouth once a day 2)  Isentress 400 Mg Tabs (Raltegravir potassium) .... Take 1 tablet by mouth two times a day  Other Orders: Influenza Vaccine MCR (60454) Future Orders: T-CD4SP ([facility]) (CD4SP) ... 11/05/2009 T-HIV Viral Load [id number]) ... 11/05/2009 T-Comprehensive Metabolic Panel [id number]) ... 11/05/2009 T-CBC w/Diff ([id number]) ... 11/05/2009 T-Lipid Profile [id number]) ... 11/05/2009  Patient Instructions: 1)  Please schedule a follow-up appointment in 8 weeks.  Prescriptions: PRAVASTATIN SODIUM 20 MG TABS (PRAVASTATIN SODIUM) Take 1 tablet by mouth once a day  #30 x 5   Entered by:   Wendall Mola CMA ( AAMA)   Authorized by:   Yisroel Ramming MD   Signed by:   Wendall Mola CMA ( AAMA) on 09/24/2009   Method used:   Print then Give to Patient   RxID:   [id number] ISENTRESS 400 MG TABS (RALTEGRAVIR POTASSIUM) Take 1 tablet by mouth two times a day  #60 x 5   Entered by:   Wendall Mola CMA ( AAMA)   Authorized by:   Yisroel Ramming MD   Signed by:   Wendall Mola CMA ( AAMA) on 09/24/2009   Method used:   Print then Give to Patient   RxID:   [id number] TRUVADA 200-300 MG TABS (EMTRICITABINE-TENOFOVIR) Take 1 tablet by mouth once a day  #30 x 5   Entered by:   Wendall Mola CMA ( AAMA)   Authorized by:   Yisroel Ramming MD   Signed by:   Wendall Mola CMA ( AAMA) on 09/24/2009   Method used:   Print then  Give to Patient   RxID:   [id number] PRAVASTATIN SODIUM 20 MG TABS (PRAVASTATIN SODIUM) Take 1 tablet by mouth once a day  #30 x 5   Entered and Authorized by:   Yisroel Ramming MD   Signed by:   Yisroel Ramming MD on 09/24/2009   Method used:   Print then Give to Patient   RxID:   [id number] HYDROCHLOROTHIAZIDE 25 MG TABS (HYDROCHLOROTHIAZIDE) Take 1 tablet by mouth once a day  #30 x 5   Entered and Authorized by:   Yisroel Ramming MD   Signed by:   Yisroel Ramming MD on 09/24/2009   Method used:   Print then Give to Patient   RxID:   [id number] NAPROXEN 500 MG TABS (NAPROXEN) Take 1 tablet by mouth two times a day as needed  #  60 x 5   Entered and Authorized by:   Yisroel Ramming MD   Signed by:   Yisroel Ramming MD on 09/24/2009   Method used:   Print then Give to Patient   RxID:   [id number]  Patient has a [employer] Medicare-D card whom I called in her presence.  She has a set copay of $6.30 for her specialty drugs.  CMA printed her prescriptions to give to pt. Paulo Fruit  BS,CPht II,MPH  September 24, 2009 10:20 AM   Immunizations Administered:  Influenza Vaccine # 1:    Vaccine Type: Fluvax MCR    Site: right deltoid    Mfr: Novartis    Dose: 0.5 ml    Route: IM    Given by: Wendall Mola CMA ( AAMA)    Exp. Date: 04/07/2010    Lot #: [id number]  Flu Vaccine Consent Questions:    Do you have a history of severe allergic reactions to this vaccine? no    Any prior history of allergic reactions to egg and/or gelatin? no    Do you have a sensitivity to the preservative Thimersol? no    Do you have a past history of Guillan-Barre Syndrome? no    Do you currently have an acute febrile illness? no    Have you ever had a severe reaction to latex? no    Vaccine information given and explained to patient? yes    Are you currently pregnant? no

## 2010-02-05 NOTE — Progress Notes (Signed)
Summary: gym memberhip letter  Phone Note Call from Patient   Caller: Patient Summary of Call: Pt. called about letter saying she needed a gym membership faxed to [employer].   Tried to return pts. call number not in service [phone] Initial call taken by: Wendall Mola CMA Duncan Dull),  November 08, 2009 4:39 PM

## 2010-02-05 NOTE — Progress Notes (Signed)
Summary: lab results  Phone Note Call from Patient   Caller: Patient Reason for Call: Lab or Test Results Summary of Call: Pt. rescheduled her appt. for today until 09/24/09 would like her lab results Initial call taken by: Wendall Mola CMA Duncan Dull),  September 05, 2009 2:53 PM  Follow-up for Phone Call        ok Follow-up by: Yisroel Ramming MD,  September 05, 2009 4:05 PM  Additional Follow-up for Phone Call Additional follow up Details #1::        Pt. notified Additional Follow-up by: Wendall Mola CMA Duncan Dull),  September 05, 2009 4:24 PM

## 2010-02-05 NOTE — Letter (Signed)
Summary: [facility]  [address]   [place], [place] [address]   Phone: [phone]  Fax: [phone]          11/09/2009   RE: Laurie Gray     To Whom It May Concern:  Laurie Gray is a patient of mine. I believe exercise would greatly benefit her health.  Sincerely,   Yisroel Ramming MD

## 2010-02-05 NOTE — Miscellaneous (Signed)
Summary: Orders Update  Clinical Lists Changes  Orders: Added new Test order of T-CBC w/Diff [phone]) - Signed Added new Test order of T-Comprehensive Metabolic Panel [phone]) - Signed Added new Test order of T-HIV Viral Load [phone]) - Signed Added new Test order of T-CD4SP [facility]) (CD4SP) - Signed     Process Orders Check Orders Results:     [facility]: Check successful Tests Sent for requisitioning (August 22, 2009 11:28 AM):     08/22/2009: [facility] -- T-CBC w/Diff [[id number]] (signed)     08/22/2009: [facility] -- T-Comprehensive Metabolic Panel [[id number]] (signed)     08/22/2009: [facility] -- T-HIV Viral Load [phone] (signed)

## 2010-02-05 NOTE — Progress Notes (Signed)
Summary: Cough, ? pneumonia, advised eval. by [facility], message left  Phone Note Call from Patient Call back at [facility] Phone [phone]   Caller: Patient Reason for Call: Acute Illness Summary of Call: Message left.  Pt. with cough and thinks she may have something like pneumonia.  Pt. did not state whether she was having fever or chills.  RN returned call.  Message left for pt. to go to the [facility] for evaluation and treatment before Tuesday when the Center reopens after the Labor Day weekend.  RN requested that the pt. call back after the weekend to let the Center know how she is feeling and what she did to have her symptoms evaluated. Jennet Maduro RN  September 07, 2009 4:21 PM

## 2010-02-05 NOTE — Miscellaneous (Signed)
Summary: clinical update/ryan white  Clinical Lists Changes  Observations: Added new observation of PAYOR: Medicare (05/30/2009 10:59) Added new observation of AIDSDAP: No (05/30/2009 10:59) Added new observation of PCTFPL: 116.34  (05/30/2009 10:59) Added new observation of INCOMESOURCE: SSI  (05/30/2009 10:59) Added new observation of HOUSEINCOME: 27035  (05/30/2009 10:59) Added new observation of #CHILD<18 IN: No  (05/30/2009 10:59) Added new observation of FAMILYSIZE: 1  (05/30/2009 10:59) Added new observation of HOUSING: Stable/permanent  (05/30/2009 10:59) Added new observation of FINASSESSDT: 05/30/2009  (05/30/2009 10:59) Added new observation of GENDER: Female  (05/30/2009 10:59) Added new observation of MARITAL STAT: Single  (05/30/2009 10:59) Added new observation of LATINO/HISP: No  (05/30/2009 10:59) Added new observation of RACE: African American  (05/30/2009 10:59) Added new observation of REC_MESSAGE: Yes  (05/30/2009 10:59) Added new observation of RECPHONECALL: Yes  (05/30/2009 10:59) Added new observation of REC_MAIL: Yes  (05/30/2009 10:59) Added new observation of RW VITAL STA: Active  (05/30/2009 10:59) Added new observation of PATNTCOUNTY: [place]  (05/30/2009 10:59) Added new observation of RWPARTICIP: Yes  (05/30/2009 10:59)

## 2010-02-05 NOTE — Progress Notes (Signed)
Summary: hip pain  Phone Note Call from Patient   Caller: Patient Reason for Call: Acute Illness Summary of Call: pt. called c/o left hip pain, thinks she pulled a muscle and she is requesting something for pain.  Called to [facility] on [address] Initial call taken by: Wendall Mola CMA Duncan Dull),  October 03, 2009 1:58 PM  Follow-up for Phone Call        what has she tried does she want an anti-inflammatory or narcotic? Follow-up by: Yisroel Ramming MD,  October 03, 2009 2:09 PM  Additional Follow-up for Phone Call Additional follow up Details #1::        pt. has tried Hyrdrocodone and had some relief with that Additional Follow-up by: Wendall Mola CMA Duncan Dull),  October 03, 2009 2:34 PM    Additional Follow-up for Phone Call Additional follow up Details #2::    vicodin 5/500mg  q8 as needed #30 Follow-up by: Yisroel Ramming MD,  October 03, 2009 3:03 PM  New/Updated Medications: VICODIN 5-500 MG TABS (HYDROCODONE-ACETAMINOPHEN) Take 1 tablet by mouth every 8 hours as needed Prescriptions: VICODIN 5-500 MG TABS (HYDROCODONE-ACETAMINOPHEN) Take 1 tablet by mouth every 8 hours as needed  #30 x 0   Entered by:   Wendall Mola CMA ( AAMA)   Authorized by:   Yisroel Ramming MD   Signed by:   Wendall Mola CMA ( AAMA) on 10/03/2009   Method used:   Telephoned to ...       [facility]  [address] [id number] ([facility]       [address]       [place], [place]  [address]       Ph: [phone] or [phone]       Fax: [phone]   RxID:   [id number]

## 2010-02-05 NOTE — Assessment & Plan Note (Signed)
Summary: f/u labs   CC:  follow-up visit, lab results, and c/o headache.  History of Present Illness: Pt here to ger BP checked and lab results.  She states that her sinus congestion has improved. No side effects to the HCTZ.  Pt would like some information about the new HIV meds. She is considering taking meds.  She had horrible side efects to the zerit she took years ago.  Preventive Screening-Counseling & Management  Alcohol-Tobacco     Alcohol drinks/day: 0     Smoking Status: never  Caffeine-Diet-Exercise     Caffeine use/day: 1     Does Patient Exercise: yes     Type of exercise: cardio     Exercise (avg: min/session): <30     Times/week: 4  Safety-Violence-Falls     Seat Belt Use: yes      Sexual History:  n/a.    Comments: pt. declined condoms   Updated Prior Medication List: NAPROXEN 500 MG TABS (NAPROXEN) Take 1 tablet by mouth two times a day as needed HYDROCHLOROTHIAZIDE 25 MG TABS (HYDROCHLOROTHIAZIDE) Take 1 tablet by mouth once a day PROMETHAZINE HCL 25 MG TABS (PROMETHAZINE HCL) Take 1 tablet by mouth every 8 hours as needed PRAVASTATIN SODIUM 20 MG TABS (PRAVASTATIN SODIUM) Take 1 tablet by mouth once a day  Current Allergies (reviewed today): ! SULFA Past History:  Past Medical History: Last updated: 04/03/2008 Depression HIV disease Hyperlipidemia  Review of Systems  The patient denies anorexia, fever, and weight loss.    Vital Signs:  Patient profile:   55 year old female Height:      65 inches (165.10 cm) Weight:      139.2 pounds (63.27 kg) BMI:     23.25 Temp:     99.2 degrees F (37.33 degrees C) oral Pulse rate:   78 / minute BP sitting:   133 / 81  (left arm)  Vitals Entered By: Wendall Mola CMA Duncan Dull) (May 30, 2009 10:14 AM) CC: follow-up visit, lab results, c/o headache Is Patient Diabetic? No Pain Assessment Patient in pain? yes     Location: head Intensity: 8 Type: aching Onset of pain  Constant Nutritional  Status BMI of 19 -24 = normal Nutritional Status Detail appetite "fine"  Does patient need assistance? Functional Status Self care Ambulation Normal Comments no missed doses of meds per patient   Physical Exam  General:  alert, well-developed, well-nourished, and well-hydrated.   Head:  normocephalic and atraumatic.   Mouth:  pharynx pink and moist.     Impression & Recommendations:  Problem # 1:  ESSENTIAL HYPERTENSION, BENIGN (ICD-401.1) Assessment Improved  Her updated medication list for this problem includes:    Hydrochlorothiazide 25 Mg Tabs (Hydrochlorothiazide) .Marland Kitchen... Take 1 tablet by mouth once a day  Problem # 2:  HYPERLIPIDEMIA (ICD-272.4) will try pravastatin Her updated medication list for this problem includes:    Pravastatin Sodium 20 Mg Tabs (Pravastatin sodium) .Marland Kitchen... Take 1 tablet by mouth once a day  Orders: Est. Patient Level III ([address])  Labs Reviewed: SGOT: 23 (05/18/2009)   SGPT: 20 (05/18/2009)   HDL:45 (05/18/2009), 51 (12/25/2008)  LDL:173 (05/18/2009), 152 (12/25/2008)  Chol:240 (05/18/2009), 229 (12/25/2008)  Trig:109 (05/18/2009), 129 (12/25/2008)  Problem # 3:  HIV DISEASE (ICD-042) currently not on medication  pt given some web sites that she can go to for info Orders: Est. Patient Level III (60454)  Diagnostics Reviewed:  CD4: 270 (05/18/2009)   WBC: 4.5 (05/18/2009)   Hgb: 12.3 (  05/18/2009)   HCT: 39.6 (05/18/2009)   Platelets: 249 (05/18/2009) HIV-1 RNA: 17900 (05/18/2009)     Medications Added to Medication List This Visit: 1)  Pravastatin Sodium 20 Mg Tabs (Pravastatin sodium) .... Take 1 tablet by mouth once a day  Patient Instructions: 1)  Please schedule a follow-up appointment in 3 months. Prescriptions: PRAVASTATIN SODIUM 20 MG TABS (PRAVASTATIN SODIUM) Take 1 tablet by mouth once a day  #30 x 5   Entered and Authorized by:   Yisroel Ramming MD   Signed by:   Yisroel Ramming MD on 05/30/2009   Method used:   Print then Give to  Patient   RxID:   [id number]

## 2010-02-07 NOTE — Miscellaneous (Signed)
Summary: lab  Clinical Lists Changes  Orders: Added new Test order of T-Lipid Profile [phone]) - Signed     Process Orders Check Orders Results:     [facility]: Check successful Order queued for requisitioning for [facility]: January 10, 2010 4:10 PM  Tests Sent for requisitioning (January 10, 2010 4:10 PM):     01/10/2010: [facility] -- T-Lipid Profile [phone] (signed)

## 2010-02-13 NOTE — Assessment & Plan Note (Signed)
Summary: 31month f/u/vs   CC:  Follow-up visit, lab results, pt. has missed three doses of Atripla, has concerns with meds, and dizziness and hand numbness.  History of Present Illness: patient complains of dizziness from her atrial flutter.  She also complains of some numbness and tingling in her right arm which gets better as the day goes on.  She has been taking her HIV medication every day.  Preventive Screening-Counseling & Management  Alcohol-Tobacco     Alcohol drinks/day: 0     Smoking Status: never  Caffeine-Diet-Exercise     Caffeine use/day: 1     Does Patient Exercise: yes     Type of exercise: cardio     Exercise (avg: min/session): <30     Times/week: 4  Safety-Violence-Falls     Seat Belt Use: yes      Sexual History:  n/a.    Comments: pt. declined condoms   Updated Prior Medication List: NAPROXEN 500 MG TABS (NAPROXEN) Take 1 tablet by mouth two times a day as needed HYDROCHLOROTHIAZIDE 25 MG TABS (HYDROCHLOROTHIAZIDE) Take 1 tablet by mouth once a day PROMETHAZINE HCL 25 MG TABS (PROMETHAZINE HCL) Take 1 tablet by mouth every 8 hours as needed PRAVASTATIN SODIUM 20 MG TABS (PRAVASTATIN SODIUM) Take 1 tablet by mouth once a day VICODIN 5-500 MG TABS (HYDROCODONE-ACETAMINOPHEN) Take 1 tablet by mouth every 8 hours as needed ATRIPLA 600-200-300 MG TABS (EFAVIRENZ-EMTRICITAB-TENOFOVIR) Take 1 tablet by mouth at bedtime  Current Allergies (reviewed today): ! SULFA Past History:  Past Medical History: Last updated: 04/03/2008 Depression HIV disease Hyperlipidemia  Review of Systems  The patient denies anorexia, fever, and weight loss.    Vital Signs:  Patient profile:   55 year old female Height:      65 inches (165.10 cm) Weight:      140.0 pounds (63.64 kg) BMI:     23.38 Temp:     98.5 degrees F (36.94 degrees C) oral Pulse rate:   88 / minute BP sitting:   137 / 86  (right arm)  Vitals Entered By: Wendall Mola CMA Duncan Dull) (January 24, 2010 3:32 PM) CC: Follow-up visit, lab results, pt. has missed three doses of Atripla, has concerns with meds, dizziness and hand numbness Is Patient Diabetic? No Pain Assessment Patient in pain? no      Nutritional Status BMI of 19 -24 = normal Nutritional Status Detail appetite "good"  Have you ever been in a relationship where you felt threatened, hurt or afraid?No   Does patient need assistance? Functional Status Self care Ambulation Normal   Physical Exam  General:  alert, well-developed, well-nourished, and well-hydrated.   Head:  normocephalic and atraumatic.   Mouth:  pharynx pink and moist.   Lungs:  normal breath sounds.     Impression & Recommendations:  Problem # 1:  HIV DISEASE (ICD-042) Pt.s most recent CD4ct was 390 and VL <20 .  Pt instructed to continue the current antiretroviral regimen.  Pt encouraged to take medication regularly and not miss doses.  Pt will f/u in 3 months for repeat blood work and will see me 2 weeks later. Pt encouraged to keep taking her Atripla. The dizziness should resolve.  Diagnostics Reviewed:  CD4: 390 (01/11/2010)   WBC: 3.2 (01/10/2010)   Hgb: 12.6 (01/10/2010)   HCT: 38.5 (01/10/2010)   Platelets: 239 (01/10/2010) HIV-1 RNA: <20 copies/mL (01/10/2010)     Problem # 2:  HYPERLIPIDEMIA (ICD-272.4) discussed treatment. Her updated medication list for  this problem includes:    Pravastatin Sodium 20 Mg Tabs (Pravastatin sodium) .Marland Kitchen... Take 1 tablet by mouth once a day  Other Orders: Est. Patient Level III (62130) Pneumococcal Vaccine (86578) Admin 1st Vaccine (46962) TB Skin Test (95284) Admin of Any Addtl Vaccine (13244) Future Orders: T-CD4SP ([facility]) (CD4SP) ... 04/24/2010 T-HIV Viral Load [phone]) ... 04/24/2010 T-Comprehensive Metabolic Panel [phone]) ... 04/24/2010 T-CBC w/Diff ([id number]) ... 04/24/2010 T-Lipid Profile [id number]) ... 04/24/2010  Patient Instructions: 1)  Please schedule a  follow-up appointment in 3 months, 2 weeks after labs.       Immunizations Administered:  Pneumonia Vaccine:    Vaccine Type: Pneumovax    Site: right deltoid    Mfr: Merck    Dose: 0.5 ml    Route: IM    Given by: Wendall Mola CMA ( AAMA)    Exp. Date: 05/16/2011    Lot #: [id number]    VIS given: 12/11/08 version given January 24, 2010.  PPD Skin Test:    Vaccine Type: PPD    Site: left forearm    Mfr: Sanofi Pasteur    Dose: 0.1 ml    Route: ID    Given by: Wendall Mola CMA ( AAMA)    Exp. Date: 07/13/2011    Lot #: [id number]

## 2010-03-19 LAB — T-HELPER CELL (CD4) - (RCID CLINIC ONLY)
CD4 % Helper T Cell: 23 % — ABNORMAL LOW (ref 33–55)
CD4 T Cell Abs: 300 uL — ABNORMAL LOW (ref 400–2700)

## 2010-03-26 LAB — T-HELPER CELL (CD4) - (RCID CLINIC ONLY)
CD4 % Helper T Cell: 18 % — ABNORMAL LOW (ref 33–55)
CD4 T Cell Abs: 270 uL — ABNORMAL LOW (ref 400–2700)

## 2010-04-15 ENCOUNTER — Other Ambulatory Visit: Payer: Self-pay | Admitting: *Deleted

## 2010-04-16 ENCOUNTER — Other Ambulatory Visit: Payer: Self-pay | Admitting: Licensed Clinical Social Worker

## 2010-04-16 DIAGNOSIS — M79641 Pain in right hand: Secondary | ICD-10-CM

## 2010-04-16 MED ORDER — HYDROCODONE-ACETAMINOPHEN 5-500 MG PO TABS: 1.0000 | ORAL_TABLET | Freq: Three times a day (TID) | ORAL | Status: DC | PRN

## 2010-05-01 ENCOUNTER — Other Ambulatory Visit: Payer: Self-pay

## 2010-05-03 ENCOUNTER — Other Ambulatory Visit: Payer: Self-pay

## 2010-05-15 ENCOUNTER — Ambulatory Visit: Payer: Self-pay | Admitting: Infectious Diseases

## 2010-05-21 NOTE — Op Note (Signed)
NAME:  Laurie Gray, Laurie Gray               ACCOUNT NO.:  [id number]   MEDICAL RECORD NO.:  [id number]          PATIENT TYPE:  AMB   LOCATION:  [facility]                         FACILITY:  [facility]   PHYSICIAN:  Juan H. Lily Peer, M.D.DATE OF BIRTH:  Sep 28, 1955   DATE OF PROCEDURE:  10/15/2007  DATE OF DISCHARGE:                               OPERATIVE REPORT   SURGEON:  Juan H. Lily Peer, M.D.   FIRST ASSISTANT:  Colin Broach, M.D.   INDICATIONS FOR OPERATION:  The patient is a 55 year old gravida 2, para  2 with history of HIV complaining of chronic pelvic pain, right greater  than left.  The patient with prior hysterectomy several years ago as a  result of leiomyomatous uteri, dysmenorrhea, and menorrhagia.   PREOPERATIVE DIAGNOSES:  Chronic pelvic pain, right greater than left,  rule out pelvic adhesions, and rule out endometriosis.   POSTOPERATIVE DIAGNOSIS:  Chronic pelvic pain, right greater than left,  rule out pelvic adhesions, and rule out endometriosis.   ANESTHESIA:  General endotracheal anesthesia.   PROCEDURE PERFORMED:  Pelvic adhesiolysis and laparoscopic bilateral  salpingo-oophorectomy.   FINDINGS:  The patient with evidence of obliterated cul-de-sac with  filmy adhesions, both ovaries adhered to the pelvic sidewalls on the  left greater than on the right.   DESCRIPTION:  Normal appearing liver surface and gallbladder.  Appendix  was not able to visualized.  It was apparently retrocecal and there was  absence of the uterus.   DESCRIPTION OF OPERATION:  After the patient adequately counseled, she  was taken to the operating room where she underwent successful general  endotracheal anesthesia.  She had received 2 gram cefoxitin for  prophylaxis.  PSA stockings had been placed for DVT prophylaxis.  After  the abdomen was prepped and draped in usual sterile fashion, Foley  catheter been in place, a small subumbilical incision was made with the  use of the 10/11 mm  trocar/OptiVu, entrance into the peritoneal cavity  was accomplished.  A pneumoperitoneum was established after 3 liters of  carbon dioxide were insufflated into the peritoneal cavity.  The patient  was placed in Trendelenburg position.  The laparoscopic sleeve was left  in place and a laparoscope was inserted.  Two additional ports were made  whereby two 5 mm ports with the utilization of trocar, we utilized  approximately the 5 fingerbreadths in the midline and both lower  quadrants under laparoscopic guidance.  After systematic inspection of  the pelvis with a description provided above, the harmonic scalpel was  brought into the pelvis and with counter traction, the cul-de-sac  adhesions that were obliterating the cul-de-sac were lysed.  Attention  was then placed to the right ovary which appeared somewhat atrophic.  The right ureter was identified.  The ovary was placed under tension and  with the use of harmonic scalpel of the right infundibulopelvic ligament  was coapted and transected with the removal of the entire right tube and  ovary.  Similar procedure was carried on the contralateral side,  although more meticulous dissection was required.  The left ovary was  adhered to the  omentum overlying the sigmoid colon and perhaps a small  fragment very small ovarian remnant may have been left behind, but it  was safer than to cause any bowel injury.  After both ovaries were  removed from the pelvic cavity with the Endopouch, whereby 5-mm  hysteroscope was used in one of the 5 mm ports and to the 10 mm trocar  site, the Endopouch was inserted and both specimens were removed and  passed off the operative field for histological evaluation.  The 10/11  mm laparoscope was inserted back into the 10 mm port and pelvic washings  were performed and after ascertaining adequate hemostasis,  pneumoperitoneum was removed and the instruments were removed.  The  subumbilical fascial incision was  reapproximated with a running stitch  of 3-0 Vicryl suture and all 3 port sites were reapproximated with  Dermabond glue.  For postoperative analgesia 0.25% Marcaine was  infiltrated all 3 port site with total of 10 mL.  The Foley catheter was  removed and the patient was extubated, transferred to recovery with  stable vital signs.  Blood loss from procedure was minimal.  Fluid  resuscitation consisted of 900 mL of lactated Ringer's.  Urine output  100 cc.  She received 30 mg of Toradol and brought to the recovery room  and 10 mL of 0.25% Marcaine was infiltrated to all 3 incision port for  postoperative analgesia.      Juan H. Lily Peer, M.D.  Electronically Signed     JHF/MEDQ  D:  10/15/2007  T:  10/15/2007  Job:  [id number]

## 2010-05-21 NOTE — H&P (Signed)
NAME:  Laurie Gray, Laurie Gray               ACCOUNT NO.:  [id number]   MEDICAL RECORD NO.:  [id number]         PATIENT TYPE:  HAMB   LOCATION:                               FACILITY:  [facility]   PHYSICIAN:  Juan H. Lily Peer, M.D.DATE OF BIRTH:  12/24/1955   DATE OF ADMISSION:  10/15/2007  DATE OF DISCHARGE:                              HISTORY & PHYSICAL   CHIEF COMPLAINT:  Chronic right lower quadrant pain.   HISTORY OF PRESENT ILLNESS:  The patient is a 55 year old gravida 2,  para 2 with history of positive HIV over the past 5 years.  He has been  followed by her internist at [facility] for periodic CD4 counts  currently on no medication.  She was seen as a new patient in the office  on September 17, complaining of right-sided pain.  The patient with  history also of prior abdominal hysterectomy for fibroids.  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  She also has vitamin D deficiency that was diagnosis  recently and was started on vitamin D 50,000 units weekly for 12 weeks  and then will be followed with 1 tablet monthly, and also recently  diagnosed of hypercholesterolemia, was started on Crestor 10 mg daily.  The patient had an ultrasound in the office as a result of her chronic  pelvic pain, and the ultrasound demonstrated absence of the uterus of  the left ovary with thin-walled follicles measuring 25 mm x 16 mm x 21  mm, 21 mm x 18 mm x 17 mm with a thin septum measuring 5 mm, but  negative color flow Doppler, and a paraovarian cyst of 7 mm x 7 mm.  The  chronicity and the frequency of the patient's right lower quadrant pain  is affecting her quality of life.  The patient would like to have her  remaining ovaries removed as she is 55 years of age, although we had  done.  Blood work here in the office recently was evident that her [facility]  was in the normal range.  She also had a recent TSH and prolactin, which  were normal as was her  Pap smear, so she is scheduled to undergo  laparoscopic bilateral salpingo-oophorectomy.   PAST MEDICAL HISTORY:  The patient was diagnosed with HIV 5 years ago.  She has hypercholesterolemia, on Crestor 10 mg daily started on  October 01, 2007.  She has vitamin D deficiency, recently diagnosed,  on 50,000 units monthly.  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  Prior surgery is consisted of total abdominal hysterectomy for  fibroids.   FAMILY HISTORY:  Mother with breast cancer as well as ovarian cancer.   PHYSICAL EXAMINATION:  GENERAL:  The patient weighs 136 pounds.  She is  5 feet 4-1/2 inch tall.  VITAL SIGNS:  Blood pressure 134/88.  HEENT:  Unremarkable.  NECK:  Supple.  Trachea midline.  No carotid bruits.  No thyromegaly.  LUNGS:  Clear to auscultation.  No rhonchi or wheezes.  HEART:  Regular rate and rhythm.  No  murmurs or gallop.  BREAST:  Unremarkable.  ABDOMEN: Midline scar.  PELVIC:  Bartholin, urethra, and Skene's are in normal limits.  VAGINA:  Vaginal cuff intact.  Tenderness on the right adnexa.  No  discernible large mass is evident.   ASSESSMENT:  A 55 year old gravida 2, para 2 with history of HIV for  past 5 years with prior history of abdominal hysterectomy, complaining  of chronic right lower quadrant pain,  perimenopausal, scheduled to  undergo laparoscopic bilateral salpingo-oophorectomy.  The patient is  fully aware that once both ovaries are removed, she will need to be  placed on hormone replacement therapy in an effort to combat any  potential vasomotor symptoms that she may experience.  The risks,  benefits, and pros and cons of the operation were discussed in detail to  include infection, although she will receive prophylaxis antibiotic and  PSA stockings for DVT prophylaxis.  In the event of any technical  difficulty or injury or trauma to internal organs, the laparoscope will  need to be removed.  An open laparotomy technique  may need to be  performed in an effort to remove the remaining tubes and ovary, which  would then require to stay in the hospital for additional few days.  In  the event of any hemorrhages, she would need blood or blood products.  She is fully aware of potential risk of anaphylactic reactions,  hepatitis, and AIDS.  All these issues were discussed with the patient  in detail.  All questions were answered, and we will follow accordingly.   PLAN:  The patient is scheduled to undergo laparoscopic bilateral  salpingo-oophorectomy on Friday, October 9th at 07:30 a.m. at [facility].  Please have history and physical available.      Juan H. Lily Peer, M.D.  Electronically Signed     JHF/MEDQ  D:  10/14/2007  T:  10/15/2007  Job:  [id number]

## 2010-05-23 ENCOUNTER — Other Ambulatory Visit: Payer: Self-pay | Admitting: Licensed Clinical Social Worker

## 2010-05-23 DIAGNOSIS — I10 Essential (primary) hypertension: Secondary | ICD-10-CM

## 2010-05-23 MED ORDER — HYDROCHLOROTHIAZIDE 25 MG PO TABS: 25.0000 mg | ORAL_TABLET | Freq: Every day | ORAL | Status: DC

## 2010-05-24 NOTE — Op Note (Signed)
[facility]  Patient:    Laurie Gray, Laurie Gray             MRN: [id number] Proc. Date: 12/11/99 Adm. Date:  [id number] Disc. Date: [id number] Attending:  Benny Lennert CC:         [facility]   Operative Report  PREOPERATIVE DIAGNOSES:  Large uterine fibroid, status post conservative therapy.  POSTOPERATIVE DIAGNOSES:  Large uterine fibroid, status post conservative therapy.  OPERATION PERFORMED:  Total abdominal hysterectomy, exploratory laparotomy.  DESCRIPTION OF PROCEDURE:  The patient was placed in lithotomy position, prepped and draped in the usual fashion. A midline incision was made in the abdomen and extended in layers to the peritoneal cavity. Exploration of the peritoneal cavity revealed no abnormalities. Both ovaries were normal. The uterus was about 14 week size and irregular with several submucosal myomas. There was one large central fundal myoma as well. The utero-ovarian anastomoses were transected and cut along with the round ligament and ligated and cut. The peritoneum was dissected off the lower segment, vessels were ligated with #0 chromic suture.  The cardinals, uterosacral and paracervical tissue were ligated and sutured with #0 chromic. The angles of the vagina were entered. The specimen was removed from the operative field, the vagina was closed in a horizontal plane with figure-of-eight #0 Vicryl. The uterosacral ligaments were then carefully plicated in the midline with interrupted sutures of #0 Vicryl. This afforded excellent vault support. There was an ooze in the base of the ovary on the left which was suture ligated with one 3-0 Vicryl. At the termination of the procedure, the hemostasis was secure. The pelvis was washed with copious amounts of saline, the abdominal viscera inspected. The packs were removed. Sponge and needle count was correct. The parietal peritoneum was closed with 2-0 PDS. The fascia was closed  with figure-of-eight sutures of #0 Novofil and the subcutaneum was closed with 2-0 Vicryl.  The skin was closed with clips and three nylon sutures. The incision was infiltrated with 0.5% Marcaine with epinephrine. The patient was awakened and carried to the recovery room in good condition. DD:  12/11/99 TD:  12/11/99 Job: [id number]

## 2010-05-24 NOTE — Op Note (Signed)
[place]  Patient:    Laurie Gray, Laurie Gray Visit Number: [id number] MRN: [id number]          Service Type: DSU Location: [facility] Attending Physician:  Alvan Dame Dictated by:   Alvan Dame, D.P.M. Proc. Date: 01/11/01 Admit Date:  01/11/2001 Discharge Date: 01/11/2001                             Operative Report  SURGEON:  Alvan Dame, D.P.M.  PREOPERATIVE DIAGNOSIS:  Hammertoe deformity, fifth digits, bilateral.  POSTOPERATIVE DIAGNOSIS:  Hammertoe deformity, fifth digits, bilateral.  OPERATIVE PROCEDURE:  Derotational hammertoe arthroplasty with phalangeal head resection, fifth digits, bilateral.  INDICATIONS FOR SURGERY:  The patient has a several-year history of increasing pain, tenderness, and difficulty with keratoses, associated with adducto varus rotation and deformity of the fifth digits, bilateral.  The patient has been self trimming them, debriding, padding, changing of shoe gear, without any prolonged success and has difficulty at this.  She requests surgical intervention in hopes in relieving the problem on a permanent basis.  PAST HISTORY:  Remarkable for good health.  There are no contraindications to the surgery at this time.  The patient has been cleared by her primary and at the [facility] through Mogadore Drinkard, and at this time presents for surgical intervention.  ANESTHESIA:  Monitored anesthesia care (MAC), local anesthesia administered, total of approximately 9 cc of a 50:50 mixture of 2% Xylocaine plain and 0.5% Marcaine plain in a digital block fashion.  FINDINGS AND PROCEDURES:  The patient is brought into the OR, placed in the supine position.  IV sedation was established.  The foot was prepped and draped in the usual aseptic manner.  Ankle tourniquet placed above the malleoli and padded well to prevent contusion on both feet.  At this time, attention was directed to the left foot which was  exsanguinated with an Esmarch wrap, and ankle tourniquet inflated to 250 mmHg.  The following procedure was then carried out.  DEROTATIONAL HAMMERTOE ARTHROPLASTY, FIFTH DIGIT, LEFT FOOT:  Attention was directed to the fifth digit, left foot, where two semi-elliptical converging incisions were made in oblique fashion from distal-medial to proximal-laterally.  Encompassed ellipse of skin and keratotic lesion were excised in toto.  Dissection was deepened to the level of the proximal IP joint where a transverse capsular incision was made.  Medial and collateral ligaments were freed, and the head of the proximal phalanx delivered into the operative site.  At this time, utilizing bone cutting forceps, the proximal phalangeal head was resected perpendicular to its shaft.  All rough edges were smoothed.  Attention was then directed to the base of the middle phalanx where there was hypertrophy laterally.  The lateral one third of the middle phalanx was also resected in a longitudinal fashion.  At this time, the site was lavaged with copious amounts of sterile antibiotic solution and cleared of all osseous and soft tissue debris.  Closure was then accomplished utilizing 5-0 nylon in a continuous interlocking fashion.  At this time, a dry sterile compressive dressing was then applied, and the ankle tourniquet was deflated on the left foot.  Attention was then directed to the right foot which was exsanguinated with an Esmarch wrap, and ankle tourniquet inflated, and the following procedure was then carried out.  DEROTATIONAL ARTHROPLASTY, FIFTH DIGIT, RIGHT FOOT.  The exact same procedure which was performed on the left foot was repeated in identical  fashion on the right foot with identical bone resection and closure.  Upon completion of both procedures, ankle tourniquets having been deflated, immediate return of profusion was noted.  The patient was returned from the OR to recovery in  satisfactory condition with vital signs stable, and was discharged with oral and written postoperative instructions, prescriptions for pain medication, and an appointment for a follow-up office visit having been made. Dictated by:   Alvan Dame, D.P.M. Attending Physician:  Alvan Dame DD:  01/20/01 TD:  01/20/01 Job: [id number]

## 2010-05-24 NOTE — H&P (Signed)
[facility]  Patient:    Laurie Gray, Laurie Gray             MRN: [id number] Adm. Date:  [id number] Disc. Date: [id number] Attending:  Benny Lennert                         History and Physical  CHIEF COMPLAINT:  Large uterine fibroid.  HISTORY OF PRESENT ILLNESS:  Ms. Toelle is a 55 year old, gravida 2, para 2, female who presents for a hysterectomy for a large uterine fibroid.  She has been attempted to manipulate this fibroid with Lupron and other treatments, but the uterus has grown back to about 14 to 16 weeks size.  Because of its size, a hysterectomy has been recommended.  PAST MEDICAL HISTORY: 1. She just recently discovered she was HIV positive, and is currently on    triple therapy.  Her T-cell count is up to 450 at this time, and viral load    is less than 50.  She has been followed at [facility]. 2. History of tubal ligation in 1990. 3. Wisdom teeth removed in 1999. 4. Kidney stone in 1996.  ALLERGIES:  SULFA.  REVIEW OF SYSTEMS:  HEENT:  She has no decrease in visual or auditory acuity.  She has a speech impediment which she says is related to her being tense at times.  No headaches, no dizziness.  HEART:  No chest pain, no shortness of breath, no history of rheumatic fever, no heart murmur.  LUNGS:  No chronic cough, no weight loss.  GASTROURINARY:  No stress incontinence or frequent urinary tract infections.  GASTROINTESTINAL:  Negative.  No bowel habit change, no weight loss or gain, good appetite.  MUSCLES, BONES, AND JOINTS:  No arthritis or fractures.  SOCIAL HISTORY:  She does not drink or smoke.  FAMILY HISTORY:  Her mother is living and has hypertension.  Had cancer of the breasts.  She is 74.  Fathers status is unknown.  She has one sister who is living and well.  Her mother has hypertension as well.  No diabetes.  PHYSICAL EXAMINATION:  VITAL SIGNS:  Weight of 147, blood pressure of 140/80.  HEENT:   Unremarkable.  Pupils are equal, round and reactive to light and accommodation.  Oropharynx is not injected.  NECK:  Supple.  Carotid pulses are equal.  No bruits heard.  Trachea is in the midline.  No adenopathy appreciated.  LUNGS:  Clear.  HEART:  Normal sinus rhythm.  No murmurs.  BREASTS:  No masses or tenderness.  ABDOMEN:  Soft, liver, spleen, and kidneys are not palpated.  Bowel sounds are normal.  Femoral pulses are equal, no tenderness.  No bruits heard.  PELVIC:  A well supported cervix which is without lesions.  Pap smear was normal.  Uterus is anterior about 4 times normal size, irregular, and in the midline.  Ultrasound has confirmed this large uterine fibroid.  EXTREMITIES:  Show good range of motion and equal pulses and reflexes.  IMPRESSION:  Large uterine fibroid, status post Lupron therapy with increasing size.  PLAN:  Total abdominal hysterectomy with ovarian preservation.  She has right lower quadrant pain and painful ovulation from the right ovary, and it is considered a possibility to remove the right ovary.  Detailed informed consent has been given.  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 DD:  12/11/99 TD:  12/11/99 Job: [id number]

## 2010-05-24 NOTE — Discharge Summary (Signed)
[facility]  Patient:    Laurie Gray, Laurie Gray             MRN: [id number] Adm. Date:  [id number] Disc. Date: [id number] Attending:  Lendon Colonel CC:         [facility]   Discharge Summary  ADMISSION DIAGNOSIS:  Large uterine fibroid.  DISCHARGE DIAGNOSES: 1. Large uterine fibroid. 2. Asymptomatic human immunodeficiency virus-positive.  HISTORY OF PRESENT ILLNESS:  [name] is a 55 year old female, recently diagnosed as HIV-positive, on triple therapy, with very good prognostic changes.  Her T-cell count was 450, and her viral load is less than 50.  She was found to have a large 18-week-size uterine fibroid, and hysterectomy was recommended.  LABORATORY DATA:  Hemoglobin on admission was 12, white count was 3700. Coagulation profile and metabolic profile were normal other than an elevated calcium of 11.2.  HOSPITAL COURSE:  The patient was admitted to the hospital and underwent an uneventful hysterectomy with ovarian preservation.  Her postoperative course was uncomplicated.  She remained afebrile and without complaints.  Was discharged on December 13, 1999, to home and office care.  She was asked to continue her Percocet for pain and Restoril for sleep.  She will return to the office in approximately four days.  She is to call for fever, bleeding, or any other difficulty she may have.  CONDITION ON DISCHARGE:  Improved.  PATHOLOGY:  Pathologic confirmation of a large fibroid was noted.  This was with benign features.DD:  12/24/99 TD:  12/24/99 Job: [id number]

## 2010-05-24 NOTE — H&P (Signed)
. [facility]  Patient:    Laurie Gray, Laurie Gray Visit Number: [id number] MRN: [id number]          Service Type: DSU Location: [facility] Attending Physician:  Alvan Dame Dictated by:   Alvan Dame, D.P.M. Admit Date:  01/11/2001 Discharge Date: 01/11/2001                           History and Physical  HISTORY OF PRESENT ILLNESS:  Ms. Difatta is a 55 year old African-American female who presents to the outpatient surgical center for surgical intervention for hammer toe repair, fifth digits bilateral.  The patient has had a long-standing history of hammer toe deformity with associated keratoses, has in the past self-debrided, padded, changing of shoes and pads to prevent pain, has difficulty with enclosed shoes and activities and at this time requests surgical intervention in hopes of repairing the hammer toe deformity of both fifth digits.  PAST MEDICAL HISTORY:  Remarkable for relatively good health.  The patient does have a history of kidney stones in the past, a history of elevated cholesterol, and also is noted to be HIV positive, although under good care and under good control at this time.  Has received medical clearance from Fannie Knee Drinkard, a P.A. at the [facility] clinic.  MEDICATIONS:  Presently taking Xanax, Zerit, Viramune, Epivir, and an unknown cholesterol medication.  ALLERGIES:  SULFA.  PAST SURGICAL HISTORY:  Hysterectomy, tubal ligation.  SOCIAL HISTORY:  Denies tobacco or alcohol use.  Review of systems and past history deferred to Fannie Knee Drinkard on her primary H&P.  PHYSICAL EXAMINATION:  Lower extremity objective findings as follows: Vascular status is intact with pedal pulses palpable, DP and PT at +2/4, capillary refill x3-4 seconds.  Skin temperature is warm, turgor normal, no edema, rubor, pallor, or varicosity noted.  Neurologically, epicritic and proprioceptive sensation is intact and symmetric.  There is normal  plantar response.  DTRs are normal.  Dermatologically, skin color, pigment, and hair growth are normal.  There is a keratotic lesion in the dorsal lateral aspect of the fifth digits bilateral secondary to adductor varus rotation and hammer toe deformity.  The patient has in the past self-debrided these.  There is no sign of infection, discharge, drainage, and no cellulitis.  Lymphangitis or lymphadenopathy are not noted.  Neurologically, epicritic and proprioceptive sensations are intact and symmetric bilateral with normal plantar response and DTRs are noted.  Dermatologically, skin color and pigment are normal.  As noted, there is keratotic lesion of dorsal lateral aspect of fifth digits bilaterally, and hair growth is intact.  Orthopedic biomechanical exam reveals adductor varus rotative fifth digit/hammer toe deformity with prominence of the fifth digit proximal phalangeal head clinically and confirmed radiographically.  IMAGING STUDIES:  Radiographically there is an adductor varus rotation with prominence of the fifth digit proximal phalanx head, also some prominence of the middle phalanx on oblique views.  There are no signs of fracture, interosseous abnormality, and the foot is otherwise rectus with minimal or no deviation of the sesamoids bilaterally.  The hallus is rectus.  There are some adductor varus rotations of the fourth digits, although these are asymptomatic.  ASSESSMENT:  Findings consistent with hammer toe deformity, fifth digits bilaterally, and adductor varus rotation.  PLAN:  Plan is at this time per patient and request and my recommendation for surgical intervention in the form of deroatational hammer toe arthroplasty with phalangeal head resection, possible hemiphalangectomy of the middle phalanx bilateral.  Risks, complications, and alternatives have been reviewed with the patient, all questions asked by the patient are answered.  She has received clearance from  Fannie Knee Drinkard, her primary P.A., and the patient will be followed appropriately postoperatively once surgery has been completed. Surgical procedure scheduled at this time. Dictated by:   Alvan Dame, D.P.M. Attending Physician:  Alvan Dame DD:  01/20/01 TD:  01/20/01 Job: [id number]

## 2010-06-19 ENCOUNTER — Other Ambulatory Visit (INDEPENDENT_AMBULATORY_CARE_PROVIDER_SITE_OTHER): Payer: Medicare Other

## 2010-06-19 DIAGNOSIS — Z79899 Other long term (current) drug therapy: Secondary | ICD-10-CM

## 2010-06-19 DIAGNOSIS — B20 Human immunodeficiency virus [HIV] disease: Secondary | ICD-10-CM

## 2010-06-19 LAB — LIPID PANEL
LDL Cholesterol: 169 mg/dL — ABNORMAL HIGH (ref 0–99)
Triglycerides: 70 mg/dL (ref <150)
VLDL: 14 mg/dL (ref 0–40)

## 2010-06-19 LAB — CBC WITH DIFFERENTIAL/PLATELET
Basophils Absolute: 0 10*3/uL (ref 0.0–0.1)
Basophils Relative: 1 % (ref 0–1)
Eosinophils Absolute: 0.1 10*3/uL (ref 0.0–0.7)
Eosinophils Relative: 3 % (ref 0–5)
HCT: 39.1 % (ref 36.0–46.0)
Hemoglobin: 12.8 g/dL (ref 12.0–15.0)
MCH: 30.2 pg (ref 26.0–34.0)
MCHC: 32.7 g/dL (ref 30.0–36.0)
MCV: 92.2 fL (ref 78.0–100.0)
Monocytes Absolute: 0.3 10*3/uL (ref 0.1–1.0)
Monocytes Relative: 8 % (ref 3–12)
RDW: 13.4 % (ref 11.5–15.5)

## 2010-06-20 LAB — COMPLETE METABOLIC PANEL WITH GFR
ALT: 41 U/L — ABNORMAL HIGH (ref 0–35)
Albumin: 4.3 g/dL (ref 3.5–5.2)
Alkaline Phosphatase: 111 U/L (ref 39–117)
CO2: 30 mEq/L (ref 19–32)
Glucose, Bld: 85 mg/dL (ref 70–99)
Potassium: 4 mEq/L (ref 3.5–5.3)
Sodium: 143 mEq/L (ref 135–145)
Total Bilirubin: 0.3 mg/dL (ref 0.3–1.2)
Total Protein: 7.3 g/dL (ref 6.0–8.3)

## 2010-06-20 LAB — HIV-1 RNA QUANT-NO REFLEX-BLD: HIV-1 RNA Quant, Log: 1.3 {Log} (ref <1.30)

## 2010-07-03 ENCOUNTER — Ambulatory Visit (INDEPENDENT_AMBULATORY_CARE_PROVIDER_SITE_OTHER): Payer: Medicare Other | Admitting: Infectious Diseases

## 2010-07-03 ENCOUNTER — Encounter: Payer: Self-pay | Admitting: Infectious Diseases

## 2010-07-03 DIAGNOSIS — B20 Human immunodeficiency virus [HIV] disease: Secondary | ICD-10-CM

## 2010-07-03 DIAGNOSIS — M24549 Contracture, unspecified hand: Secondary | ICD-10-CM

## 2010-07-03 DIAGNOSIS — M79641 Pain in right hand: Secondary | ICD-10-CM

## 2010-07-03 DIAGNOSIS — L0291 Cutaneous abscess, unspecified: Secondary | ICD-10-CM

## 2010-07-03 DIAGNOSIS — M79609 Pain in unspecified limb: Secondary | ICD-10-CM

## 2010-07-03 DIAGNOSIS — L0390 Cellulitis, unspecified: Secondary | ICD-10-CM

## 2010-07-03 DIAGNOSIS — Z113 Encounter for screening for infections with a predominantly sexual mode of transmission: Secondary | ICD-10-CM

## 2010-07-03 MED ORDER — HYDROCODONE-ACETAMINOPHEN 5-500 MG PO TABS: 1.0000 | ORAL_TABLET | Freq: Three times a day (TID) | ORAL | Status: DC | PRN

## 2010-07-03 MED ORDER — EFAVIRENZ-EMTRICITAB-TENOFOVIR 600-200-300 MG PO TABS: 1.0000 | ORAL_TABLET | Freq: Every day | ORAL | Status: DC

## 2010-07-03 MED ORDER — DOXYCYCLINE HYCLATE 100 MG PO TABS: 100.0000 mg | ORAL_TABLET | Freq: Two times a day (BID) | ORAL | Status: AC

## 2010-07-03 NOTE — Assessment & Plan Note (Signed)
There is mil-mod erythema over her L shin and mild-mod swelling of her ankle. willl start her on doxy

## 2010-07-03 NOTE — Assessment & Plan Note (Addendum)
She appears to be doing well. Adherence is re-iterated to her, that she should not miss rx and she should call if she is close to running out. She is given samples for 30 days incase she needs gap coverage. She needs to have her RPR checked at next lab. rtc in 4 months.

## 2010-07-03 NOTE — Assessment & Plan Note (Signed)
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

## 2010-07-03 NOTE — Progress Notes (Signed)
  Subjective:    Patient ID: Laurie Gray, female    DOB: 04/12/1955, 55 y.o.   MRN: [id number]  HPI 55 yo F with hx of dx 2000 HIV+, depression, hyperlipidemia, prev lap hysterecomy-BSO (2009). Has been taking atripla, has been not been sexually active since dx. Family (kids, grandchildren don't know her status). Has been exercising, taking vitamins, supplements.  Just got a jack ruseele terrier. Has noted erythema around her scatches and swelling and on her L ankle. Took some keflex she had, only 2 doses. Also taking OTC antibacterial cream.  She also has had pain from this. Had temp to 100 at home.     Review of Systems  Constitutional: Negative for unexpected weight change.  Gastrointestinal: Negative for diarrhea and constipation.  Genitourinary: Negative for difficulty urinating.   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      Objective:   Physical Exam  Constitutional: She appears well-developed and well-nourished.  Eyes: EOM are normal. Pupils are equal, round, and reactive to light.  Neck: Neck supple.  Cardiovascular: Normal rate, regular rhythm and normal heart sounds.   Pulmonary/Chest: Effort normal and breath sounds normal. No respiratory distress.  Abdominal: Soft. Bowel sounds are normal. She exhibits no distension. There is no tenderness.  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

## 2010-07-07 ENCOUNTER — Emergency Department (HOSPITAL_COMMUNITY)
Admission: EM | Admit: 2010-07-07 | Discharge: 2010-07-07 | Disposition: A | Discharge status: Home / Self Care | Payer: Medicare Other | Attending: Emergency Medicine | Admitting: Emergency Medicine

## 2010-07-07 ENCOUNTER — Emergency Department (HOSPITAL_COMMUNITY): Payer: Medicare Other

## 2010-07-07 DIAGNOSIS — M129 Arthropathy, unspecified: Secondary | ICD-10-CM | POA: Insufficient documentation

## 2010-07-07 DIAGNOSIS — R1031 Right lower quadrant pain: Secondary | ICD-10-CM | POA: Insufficient documentation

## 2010-07-07 DIAGNOSIS — E7800 Pure hypercholesterolemia, unspecified: Secondary | ICD-10-CM | POA: Insufficient documentation

## 2010-07-07 DIAGNOSIS — I10 Essential (primary) hypertension: Secondary | ICD-10-CM | POA: Insufficient documentation

## 2010-07-07 DIAGNOSIS — R109 Unspecified abdominal pain: Secondary | ICD-10-CM | POA: Insufficient documentation

## 2010-07-07 DIAGNOSIS — Z87442 Personal history of urinary calculi: Secondary | ICD-10-CM | POA: Insufficient documentation

## 2010-07-07 LAB — COMPREHENSIVE METABOLIC PANEL
AST: 25 U/L (ref 0–37)
Albumin: 3.8 g/dL (ref 3.5–5.2)
BUN: 17 mg/dL (ref 6–23)
CO2: 24 mEq/L (ref 19–32)
Calcium: 10.6 mg/dL — ABNORMAL HIGH (ref 8.4–10.5)
Chloride: 107 mEq/L (ref 96–112)
Creatinine, Ser: 0.77 mg/dL (ref 0.50–1.10)
GFR calc non Af Amer: >60 mL/min (ref >60)
Total Bilirubin: 0.2 mg/dL — ABNORMAL LOW (ref 0.3–1.2)

## 2010-07-07 LAB — DIFFERENTIAL
Eosinophils Absolute: 0.1 10*3/uL (ref 0.0–0.7)
Lymphocytes Relative: 42 % (ref 12–46)
Lymphs Abs: 1.4 10*3/uL (ref 0.7–4.0)
Neutrophils Relative %: 50 % (ref 43–77)

## 2010-07-07 LAB — LIPASE, BLOOD: Lipase: 27 U/L (ref 11–59)

## 2010-07-07 LAB — URINALYSIS, ROUTINE W REFLEX MICROSCOPIC
Glucose, UA: NEGATIVE mg/dL
Leukocytes, UA: NEGATIVE
Protein, ur: NEGATIVE mg/dL
pH: 6.5 (ref 5.0–8.0)

## 2010-07-07 LAB — CBC
MCV: 89.4 fL (ref 78.0–100.0)
Platelets: 210 10*3/uL (ref 150–400)
RBC: 4.05 MIL/uL (ref 3.87–5.11)
WBC: 3.3 10*3/uL — ABNORMAL LOW (ref 4.0–10.5)

## 2010-07-07 MED ORDER — IOHEXOL 300 MG/ML  SOLN
100.0000 mL | Freq: Once | INTRAMUSCULAR | Status: AC | PRN
  Administered 2010-07-07: 100 mL via INTRAVENOUS

## 2010-09-19 ENCOUNTER — Other Ambulatory Visit: Payer: Self-pay | Admitting: Infectious Diseases

## 2010-09-19 DIAGNOSIS — Z1231 Encounter for screening mammogram for malignant neoplasm of breast: Secondary | ICD-10-CM

## 2010-09-25 ENCOUNTER — Ambulatory Visit
Admission: RE | Admit: 2010-09-25 | Discharge: 2010-09-25 | Disposition: A | Discharge status: Home / Self Care | Payer: Medicare Other | Source: Ambulatory Visit | Attending: Infectious Diseases | Admitting: Infectious Diseases

## 2010-09-25 DIAGNOSIS — Z1231 Encounter for screening mammogram for malignant neoplasm of breast: Secondary | ICD-10-CM

## 2010-09-30 LAB — POCT URINALYSIS DIP (DEVICE)
Glucose, UA: NEGATIVE
Nitrite: NEGATIVE
Operator id: 255721
Protein, ur: NEGATIVE
Urobilinogen, UA: 0.2

## 2010-09-30 LAB — URINE CULTURE

## 2010-10-31 ENCOUNTER — Other Ambulatory Visit (INDEPENDENT_AMBULATORY_CARE_PROVIDER_SITE_OTHER): Payer: Medicare Other

## 2010-10-31 DIAGNOSIS — B20 Human immunodeficiency virus [HIV] disease: Secondary | ICD-10-CM

## 2010-11-01 LAB — COMPLETE METABOLIC PANEL WITH GFR
ALT: 21 U/L (ref 0–35)
CO2: 25 mEq/L (ref 19–32)
Calcium: 10.6 mg/dL — ABNORMAL HIGH (ref 8.4–10.5)
Chloride: 103 mEq/L (ref 96–112)
Creat: 0.97 mg/dL (ref 0.50–1.10)
GFR, Est African American: 76 mL/min — ABNORMAL LOW (ref >90)
GFR, Est Non African American: 66 mL/min — ABNORMAL LOW (ref >90)
Glucose, Bld: 75 mg/dL (ref 70–99)

## 2010-11-01 LAB — CBC WITH DIFFERENTIAL/PLATELET
Basophils Relative: 1 % (ref 0–1)
Eosinophils Absolute: 0.1 10*3/uL (ref 0.0–0.7)
Eosinophils Relative: 2 % (ref 0–5)
HCT: 36.6 % (ref 36.0–46.0)
Hemoglobin: 11.9 g/dL — ABNORMAL LOW (ref 12.0–15.0)
MCH: 30.3 pg (ref 26.0–34.0)
MCHC: 32.5 g/dL (ref 30.0–36.0)
MCV: 93.1 fL (ref 78.0–100.0)
Monocytes Absolute: 0.2 10*3/uL (ref 0.1–1.0)
Monocytes Relative: 6 % (ref 3–12)
Neutro Abs: 2 10*3/uL (ref 1.7–7.7)
RDW: 13.3 % (ref 11.5–15.5)

## 2010-11-01 LAB — T-HELPER CELL (CD4) - (RCID CLINIC ONLY): CD4 % Helper T Cell: 29 % — ABNORMAL LOW (ref 33–55)

## 2010-11-04 LAB — HIV-1 RNA QUANT-NO REFLEX-BLD

## 2010-11-05 ENCOUNTER — Other Ambulatory Visit: Payer: Self-pay | Admitting: Adult Health

## 2010-11-05 DIAGNOSIS — I10 Essential (primary) hypertension: Secondary | ICD-10-CM

## 2010-12-09 ENCOUNTER — Telehealth: Payer: Self-pay | Admitting: *Deleted

## 2010-12-09 ENCOUNTER — Ambulatory Visit (INDEPENDENT_AMBULATORY_CARE_PROVIDER_SITE_OTHER): Payer: Medicare Other | Admitting: Infectious Diseases

## 2010-12-09 ENCOUNTER — Other Ambulatory Visit: Payer: Self-pay | Admitting: *Deleted

## 2010-12-09 ENCOUNTER — Encounter: Payer: Self-pay | Admitting: Infectious Diseases

## 2010-12-09 DIAGNOSIS — M24549 Contracture, unspecified hand: Secondary | ICD-10-CM

## 2010-12-09 DIAGNOSIS — E785 Hyperlipidemia, unspecified: Secondary | ICD-10-CM

## 2010-12-09 DIAGNOSIS — B20 Human immunodeficiency virus [HIV] disease: Secondary | ICD-10-CM

## 2010-12-09 DIAGNOSIS — M79641 Pain in right hand: Secondary | ICD-10-CM

## 2010-12-09 DIAGNOSIS — Z113 Encounter for screening for infections with a predominantly sexual mode of transmission: Secondary | ICD-10-CM

## 2010-12-09 DIAGNOSIS — Z79899 Other long term (current) drug therapy: Secondary | ICD-10-CM

## 2010-12-09 MED ORDER — HYDROCODONE-ACETAMINOPHEN 5-500 MG PO TABS: 1.0000 | ORAL_TABLET | Freq: Three times a day (TID) | ORAL | Status: DC | PRN

## 2010-12-09 MED ORDER — PRAVASTATIN SODIUM 20 MG PO TABS: 20.0000 mg | ORAL_TABLET | Freq: Every day | ORAL | Status: DC

## 2010-12-09 NOTE — Assessment & Plan Note (Signed)
Will refill her pravachol.

## 2010-12-09 NOTE — Assessment & Plan Note (Signed)
She appears to be doing well. Offered condoms, not sexually. vax up to date. Will see her back in 5 months. Check her RPR today and all labs prior to next visit.

## 2010-12-09 NOTE — Assessment & Plan Note (Signed)
Will re-refer her to hand surgery.

## 2010-12-09 NOTE — Telephone Encounter (Signed)
Patient notified she has appointment with [facility] orthopedic and hand specialist with Dr. Betha Loa for 12/11/10 at 8:30 am.  She will pick up her xrays from 09/2009 and bring to appointment. Wendall Mola CMA

## 2010-12-09 NOTE — Progress Notes (Signed)
  Subjective:    Patient ID: Laurie Gray, female    DOB: 04/02/1955, 55 y.o.   MRN: [id number]  HPI 55 yo F with hx of dx 2000 HIV+, depression, hyperlipidemia, prev lap hysterecomy-BSO (2009). Has been taking atripla, has been not been sexually active since dx. Family (kids, grandchildren don't know her status). Last CD4 480 and VL <20 (10-31-10). Had normal mammo 09-25-10.  Is without complaint. Needs a refill for her pravachol. Recently missed her appt to see hand surgeon for contractures.  Having throbbing headaches. Has been having bronchitis recently. Having temps to 100 at night. Feels congestion deep in her chest. Would like hydrocodone. Recently had gerbil bite on her R 2nd digit.      Review of Systems  Constitutional: Negative for fever and chills.  Gastrointestinal: Negative for diarrhea and constipation.  Genitourinary: Negative for dysuria.  Musculoskeletal: Positive for myalgias.       Objective:   Physical Exam  Constitutional: She appears well-developed and well-nourished.  Eyes: EOM are normal. Pupils are equal, round, and reactive to light.  Neck: Neck supple.  Cardiovascular: Normal rate, regular rhythm and normal heart sounds.   Pulmonary/Chest: Effort normal and breath sounds normal.  Abdominal: Soft. Bowel sounds are normal. There is no tenderness.  Lymphadenopathy:    She has no cervical adenopathy.  Skin:             Assessment & Plan:

## 2011-02-03 ENCOUNTER — Telehealth: Payer: Self-pay | Admitting: *Deleted

## 2011-02-03 NOTE — Telephone Encounter (Signed)
Received request from [facility] for Vicodin, Dr. Ninetta Lights filled this, pending patient's appointment with hand surgeon.  Patient did go to this appointment and RX denied. Wendall Mola CMA

## 2011-04-01 ENCOUNTER — Other Ambulatory Visit: Payer: Self-pay | Admitting: *Deleted

## 2011-04-01 DIAGNOSIS — Z113 Encounter for screening for infections with a predominantly sexual mode of transmission: Secondary | ICD-10-CM

## 2011-04-02 ENCOUNTER — Other Ambulatory Visit: Payer: Self-pay | Admitting: *Deleted

## 2011-04-02 DIAGNOSIS — Z113 Encounter for screening for infections with a predominantly sexual mode of transmission: Secondary | ICD-10-CM

## 2011-04-28 ENCOUNTER — Other Ambulatory Visit: Payer: Medicare Other

## 2011-04-30 ENCOUNTER — Other Ambulatory Visit: Payer: Medicare Other

## 2011-05-14 ENCOUNTER — Ambulatory Visit: Payer: Medicare Other | Admitting: Infectious Diseases

## 2011-05-26 ENCOUNTER — Other Ambulatory Visit: Payer: Medicare Other

## 2011-06-04 ENCOUNTER — Other Ambulatory Visit: Payer: Medicare Other

## 2011-06-04 DIAGNOSIS — B20 Human immunodeficiency virus [HIV] disease: Secondary | ICD-10-CM

## 2011-06-04 DIAGNOSIS — Z79899 Other long term (current) drug therapy: Secondary | ICD-10-CM

## 2011-06-05 LAB — LIPID PANEL
HDL: 70 mg/dL (ref >39)
LDL Cholesterol: 168 mg/dL — ABNORMAL HIGH (ref 0–99)
Total CHOL/HDL Ratio: 3.7 Ratio

## 2011-06-05 LAB — COMPLETE METABOLIC PANEL WITH GFR
ALT: 20 U/L (ref 0–35)
Alkaline Phosphatase: 78 U/L (ref 39–117)
CO2: 33 mEq/L — ABNORMAL HIGH (ref 19–32)
Sodium: 142 mEq/L (ref 135–145)
Total Bilirubin: 0.2 mg/dL — ABNORMAL LOW (ref 0.3–1.2)
Total Protein: 6.7 g/dL (ref 6.0–8.3)

## 2011-06-05 LAB — CBC
HCT: 35.4 % — ABNORMAL LOW (ref 36.0–46.0)
Hemoglobin: 11.9 g/dL — ABNORMAL LOW (ref 12.0–15.0)
MCH: 29.8 pg (ref 26.0–34.0)
MCHC: 33.6 g/dL (ref 30.0–36.0)

## 2011-06-09 ENCOUNTER — Ambulatory Visit: Payer: Medicare Other | Admitting: Infectious Diseases

## 2011-06-11 ENCOUNTER — Ambulatory Visit: Payer: Medicare Other | Admitting: Infectious Diseases

## 2011-06-23 ENCOUNTER — Telehealth: Payer: Self-pay | Admitting: *Deleted

## 2011-06-23 NOTE — Telephone Encounter (Signed)
rec'd a fax from [facility] stating non-adherence to statin. I called & lm for her to call me. Is she taking it?

## 2011-06-24 ENCOUNTER — Telehealth: Payer: Self-pay | Admitting: Licensed Clinical Social Worker

## 2011-06-24 NOTE — Telephone Encounter (Signed)
Patient called stating that she received a call yesterday from [facility] about how she is taking her Pravachol. Patient states that she "got off track but is now taking it correctly"

## 2011-07-07 ENCOUNTER — Other Ambulatory Visit: Payer: Self-pay | Admitting: *Deleted

## 2011-07-07 DIAGNOSIS — B20 Human immunodeficiency virus [HIV] disease: Secondary | ICD-10-CM

## 2011-07-07 MED ORDER — EFAVIRENZ-EMTRICITAB-TENOFOVIR 600-200-300 MG PO TABS: 1.0000 | ORAL_TABLET | Freq: Every day | ORAL | Status: DC

## 2011-07-21 ENCOUNTER — Other Ambulatory Visit: Payer: Self-pay | Admitting: Infectious Diseases

## 2011-07-21 ENCOUNTER — Ambulatory Visit (INDEPENDENT_AMBULATORY_CARE_PROVIDER_SITE_OTHER): Payer: Medicare Other | Admitting: Infectious Diseases

## 2011-07-21 VITALS — BP 133/79 | HR 63 | Temp 98.4°F | Ht 65.0 in | Wt 124.0 lb

## 2011-07-21 DIAGNOSIS — I10 Essential (primary) hypertension: Secondary | ICD-10-CM

## 2011-07-21 DIAGNOSIS — M79641 Pain in right hand: Secondary | ICD-10-CM

## 2011-07-21 DIAGNOSIS — M24549 Contracture, unspecified hand: Secondary | ICD-10-CM

## 2011-07-21 DIAGNOSIS — E785 Hyperlipidemia, unspecified: Secondary | ICD-10-CM

## 2011-07-21 DIAGNOSIS — R51 Headache: Secondary | ICD-10-CM

## 2011-07-21 DIAGNOSIS — J029 Acute pharyngitis, unspecified: Secondary | ICD-10-CM

## 2011-07-21 DIAGNOSIS — B20 Human immunodeficiency virus [HIV] disease: Secondary | ICD-10-CM

## 2011-07-21 MED ORDER — OMEGA-3 FATTY ACIDS 1000 MG PO CAPS: 1.0000 g | ORAL_CAPSULE | Freq: Every day | ORAL | Status: DC

## 2011-07-21 MED ORDER — EMTRICITAB-RILPIVIR-TENOFOV DF 200-25-300 MG PO TABS: 1.0000 | ORAL_TABLET | Freq: Every day | ORAL | Status: DC

## 2011-07-21 MED ORDER — HYDROCODONE-ACETAMINOPHEN 5-500 MG PO TABS: 1.0000 | ORAL_TABLET | Freq: Three times a day (TID) | ORAL | Status: DC | PRN

## 2011-07-21 MED ORDER — AZITHROMYCIN 250 MG PO TABS: ORAL_TABLET | ORAL | Status: AC

## 2011-07-21 MED ORDER — PRAVASTATIN SODIUM 20 MG PO TABS: 40.0000 mg | ORAL_TABLET | Freq: Every day | ORAL | Status: DC

## 2011-07-21 NOTE — Assessment & Plan Note (Signed)
She is doing very well, would like to try different rx due to am fatigue and headaches. Will switch her to complera, reinforced she needs to take with food. Will see her back in 3-4 months. Not sexually active. vax uptodate.

## 2011-07-21 NOTE — Assessment & Plan Note (Signed)
Will increase pravastatin to 40mg  qday. rtc 3-4 months

## 2011-07-21 NOTE — Assessment & Plan Note (Signed)
Currently well controlled.  

## 2011-07-21 NOTE — Assessment & Plan Note (Signed)
She will f/u with her hand surgeon for a new brace

## 2011-07-21 NOTE — Assessment & Plan Note (Signed)
Pt wants rx to take if this does not improve. Will give her z-pack

## 2011-07-21 NOTE — Addendum Note (Signed)
Addended by: Valrie Jia C on: 07/21/2011 10:32 AM   Modules accepted: Orders

## 2011-07-21 NOTE — Progress Notes (Signed)
  Subjective:    Patient ID: Laurie Gray, female    DOB: 05/31/1955, 56 y.o.   MRN: [id number]  HPI 56 yo F with hx of dx 2000 HIV+, depression, hyperlipidemia, prev lap hysterecomy-BSO (2009). Has been taking atripla, previously took D4T other cocktail went to undetectable. Took herself off for 5 years. CD4 went under 200 and she was restarted. Misses occas doses of ART, forgets, falls asleep.  Has a cold and sore throat. Afraid that it will turn into bronchitis.  Mother recently had shingles and has PHN. Has ? About getting shingles vaccine.   HIV 1 RNA Quant (copies/mL)  Date Value  06/04/2011 <20   10/31/2010 Comment: HIV 1 RNA not detected.   06/19/2010 20      CD4 T Cell Abs (cmm)  Date Value  06/04/2011 520   10/31/2010 480   06/19/2010 340*       Review of Systems  Constitutional: Negative for fever, chills, appetite change and unexpected weight change.  HENT: Positive for sore throat, rhinorrhea and postnasal drip.   Respiratory: Negative for cough.   Gastrointestinal: Negative for diarrhea and constipation.  Genitourinary: Negative for dysuria.       Objective:   Physical Exam  Constitutional: She appears well-developed and well-nourished.  HENT:  Mouth/Throat: No oropharyngeal exudate.  Eyes: EOM are normal. Pupils are equal, round, and reactive to light.  Neck: Neck supple.  Cardiovascular: Normal rate, regular rhythm and normal heart sounds.   Pulmonary/Chest: Effort normal and breath sounds normal.  Abdominal: Soft. Bowel sounds are normal. There is no tenderness.  Musculoskeletal:       R 5th trigger finger  Lymphadenopathy:    She has no cervical adenopathy.  Psychiatric: Judgment and thought content normal. Her mood appears anxious. Her speech is rapid and/or pressured.          Assessment & Plan:

## 2011-08-11 ENCOUNTER — Encounter (INDEPENDENT_AMBULATORY_CARE_PROVIDER_SITE_OTHER): Payer: Medicare Other | Admitting: Ophthalmology

## 2011-08-11 ENCOUNTER — Other Ambulatory Visit: Payer: Self-pay | Admitting: Licensed Clinical Social Worker

## 2011-08-11 DIAGNOSIS — H35039 Hypertensive retinopathy, unspecified eye: Secondary | ICD-10-CM

## 2011-08-11 DIAGNOSIS — I10 Essential (primary) hypertension: Secondary | ICD-10-CM

## 2011-08-11 DIAGNOSIS — H348392 Tributary (branch) retinal vein occlusion, unspecified eye, stable: Secondary | ICD-10-CM

## 2011-08-11 DIAGNOSIS — H43819 Vitreous degeneration, unspecified eye: Secondary | ICD-10-CM

## 2011-08-11 DIAGNOSIS — H2510 Age-related nuclear cataract, unspecified eye: Secondary | ICD-10-CM

## 2011-08-12 ENCOUNTER — Other Ambulatory Visit: Payer: Self-pay | Admitting: *Deleted

## 2011-08-12 DIAGNOSIS — I10 Essential (primary) hypertension: Secondary | ICD-10-CM

## 2011-08-12 MED ORDER — HYDROCHLOROTHIAZIDE 25 MG PO TABS: 25.0000 mg | ORAL_TABLET | Freq: Every day | ORAL | Status: DC

## 2011-08-25 ENCOUNTER — Telehealth: Payer: Self-pay | Admitting: Licensed Clinical Social Worker

## 2011-08-25 NOTE — Telephone Encounter (Signed)
Patient walked in today with severe dental pain, and she also have had elevated blood pressure that concerns her. Her blood pressure was 146/100 today. She has been taking 1 1/2 hctz and hydrocodone for pain. I advised her not to take the hctz like that and take it only as directed. I made her an appointment to see Dr. Drue Second tomorrow at 4:00pm. She was ok with that and stated she would be here.

## 2011-08-29 ENCOUNTER — Encounter (HOSPITAL_COMMUNITY): Payer: Self-pay | Admitting: Emergency Medicine

## 2011-08-29 ENCOUNTER — Emergency Department (HOSPITAL_COMMUNITY)
Admission: EM | Admit: 2011-08-29 | Discharge: 2011-08-29 | Disposition: A | Discharge status: Home / Self Care | Payer: Medicare Other | Attending: Emergency Medicine | Admitting: Emergency Medicine

## 2011-08-29 DIAGNOSIS — E785 Hyperlipidemia, unspecified: Secondary | ICD-10-CM | POA: Insufficient documentation

## 2011-08-29 DIAGNOSIS — Z21 Asymptomatic human immunodeficiency virus [HIV] infection status: Secondary | ICD-10-CM | POA: Insufficient documentation

## 2011-08-29 DIAGNOSIS — K0889 Other specified disorders of teeth and supporting structures: Secondary | ICD-10-CM

## 2011-08-29 DIAGNOSIS — K029 Dental caries, unspecified: Secondary | ICD-10-CM | POA: Insufficient documentation

## 2011-08-29 DIAGNOSIS — I10 Essential (primary) hypertension: Secondary | ICD-10-CM | POA: Insufficient documentation

## 2011-08-29 MED ORDER — HYDROCODONE-ACETAMINOPHEN 10-325 MG PO TABS
1.0000 | ORAL_TABLET | Freq: Once | ORAL | Status: DC
  Filled 2011-08-29: qty 1

## 2011-08-29 MED ORDER — PENICILLIN V POTASSIUM 500 MG PO TABS: 500.0000 mg | ORAL_TABLET | Freq: Three times a day (TID) | ORAL | Status: AC

## 2011-08-29 MED ORDER — HYDROCODONE-ACETAMINOPHEN 5-500 MG PO TABS: 2.0000 | ORAL_TABLET | Freq: Four times a day (QID) | ORAL | Status: DC | PRN

## 2011-08-29 MED ORDER — PENICILLIN V POTASSIUM 500 MG PO TABS
500.0000 mg | ORAL_TABLET | Freq: Four times a day (QID) | ORAL | Status: DC
  Administered 2011-08-29: 500 mg via ORAL
  Filled 2011-08-29: qty 1

## 2011-08-29 NOTE — ED Notes (Signed)
Pt states two weeks ago she lost a filling a couple weeks ago  Pt states now she is having pain and it is causing her to have headaches   Pt states she took one of her mothers pain pills and a neurontin to try to help with the pain but continues to hurt

## 2011-08-29 NOTE — ED Provider Notes (Signed)
History     CSN: [id number]  Arrival date & time 08/29/11  0246   First MD Initiated Contact with Patient 08/29/11 0354      Chief Complaint  Patient presents with  . Dental Pain    (Consider location/radiation/quality/duration/timing/severity/associated sxs/prior treatment) HPI  Patient presents to the emergency department with a dental complaint. Symptoms began 1 week ago. The patient has tried to alleviate pain with hydrocodone.  Pain rated at a 10/10, characterized as throbbing in nature and located left upper molar. Patient denies fever, night sweats, chills, difficulty swallowing or opening mouth, SOB, nuchal rigidity or decreased ROM of neck.  Patient does not have a dentist and requests a resource guide at discharge.   Past Medical History  Diagnosis Date  . HIV infection   . Hyperlipidemia   . Hypertension     Past Surgical History  Procedure Date  . Abdominal hysterectomy     Family History  Problem Relation Age of Onset  . Breast cancer Mother   . Hypertension Mother   . Cancer Mother   . Hyperlipidemia Mother     History  Substance Use Topics  . Smoking status: Never Smoker   . Smokeless tobacco: Never Used  . Alcohol Use: No     rarely    OB History    Grav Para Term Preterm Abortions TAB SAB Ect Mult Living                  Review of Systems  All other systems reviewed and are negative.    Allergies  Sulfonamide derivatives  Home Medications   Current Outpatient Rx  Name Route Sig Dispense Refill  . EMTRICITAB-RILPIVIR-TENOFOVIR 200-25-300 MG PO TABS Oral Take 1 tablet by mouth daily. 30 tablet 5  . OMEGA-3 FATTY ACIDS 1000 MG PO CAPS Oral Take 1 capsule (1 g total) by mouth daily. 90 capsule 3  . HYDROCHLOROTHIAZIDE 25 MG PO TABS Oral Take 1 tablet (25 mg total) by mouth daily. 30 tablet 6  . HYDROCODONE-ACETAMINOPHEN 5-500 MG PO TABS Oral Take 1 tablet by mouth every 8 (eight) hours as needed. pain    . MULTI-VITAMIN/MINERALS PO  TABS Oral Take 1 tablet by mouth daily.      Marland Kitchen PRAVASTATIN SODIUM 20 MG PO TABS Oral Take 2 tablets (40 mg total) by mouth daily. 90 tablet 3  . PROMETHAZINE HCL 25 MG PO TABS Oral Take 25 mg by mouth every 8 (eight) hours as needed.        BP 142/84  Pulse 62  Temp 98.3 F (36.8 C) (Oral)  Resp 15  Ht 5\' 5"  (1.651 m)  Wt 120 lb (54.432 kg)  BMI 19.97 kg/m2  SpO2 66%  Physical Exam  Constitutional: She appears well-developed and well-nourished. No distress.  HENT:  Head: Normocephalic and atraumatic.  Mouth/Throat: Uvula is midline, oropharynx is clear and moist and mucous membranes are normal. Normal dentition. Dental caries (Pts tooth shows no obvious abscess but moderate to severe tenderness to palpation of marked tooth) present. No uvula swelling.    Eyes: Pupils are equal, round, and reactive to light.  Neck: Trachea normal, normal range of motion and full passive range of motion without pain. Neck supple.  Cardiovascular: Normal rate, regular rhythm, normal heart sounds and normal pulses.   Pulmonary/Chest: Effort normal and breath sounds normal. No respiratory distress. Chest wall is not dull to percussion. She exhibits no tenderness, no crepitus, no edema, no deformity and no retraction.  Abdominal: Normal appearance.  Musculoskeletal: Normal range of motion.  Neurological: She is alert. She has normal strength.  Skin: Skin is warm, dry and intact. She is not diaphoretic.  Psychiatric: She has a normal mood and affect. Her speech is normal. Cognition and memory are normal.    ED Course  Procedures (including critical care time)  Labs Reviewed - No data to display No results found.   1. Toothache       MDM  Pt given Rx for Percocets 5-325 (10 tabs) and Penicillin. Patient informed that they need to find a dentist and have the tooth pulled or the symptoms may be reoccurring. A Resource guide has been given with dental providers. Patient has been given return to ED  precautions.         Dorthula Matas, PA 08/29/11 [phone]

## 2011-08-30 NOTE — ED Provider Notes (Signed)
Medical screening examination/treatment/procedure(s) were performed by non-physician practitioner and as supervising physician I was immediately available for consultation/collaboration.   Gloristine Turrubiates, MD 08/30/11 2303 

## 2011-09-05 ENCOUNTER — Ambulatory Visit: Payer: Medicare Other | Admitting: Internal Medicine

## 2011-10-27 ENCOUNTER — Other Ambulatory Visit: Payer: Medicare Other

## 2011-11-10 ENCOUNTER — Other Ambulatory Visit (INDEPENDENT_AMBULATORY_CARE_PROVIDER_SITE_OTHER): Payer: Medicare Other

## 2011-11-10 ENCOUNTER — Ambulatory Visit: Payer: Medicare Other | Admitting: Infectious Diseases

## 2011-11-10 ENCOUNTER — Ambulatory Visit (INDEPENDENT_AMBULATORY_CARE_PROVIDER_SITE_OTHER): Payer: Medicare Other

## 2011-11-10 DIAGNOSIS — E785 Hyperlipidemia, unspecified: Secondary | ICD-10-CM

## 2011-11-10 DIAGNOSIS — Z23 Encounter for immunization: Secondary | ICD-10-CM

## 2011-11-10 DIAGNOSIS — B20 Human immunodeficiency virus [HIV] disease: Secondary | ICD-10-CM

## 2011-11-10 LAB — CBC
HCT: 38.3 % (ref 36.0–46.0)
Hemoglobin: 12.6 g/dL (ref 12.0–15.0)
MCH: 30.4 pg (ref 26.0–34.0)
MCHC: 32.9 g/dL (ref 30.0–36.0)

## 2011-11-11 LAB — COMPLETE METABOLIC PANEL WITH GFR
ALT: 21 U/L (ref 0–35)
Albumin: 4.3 g/dL (ref 3.5–5.2)
CO2: 33 mEq/L — ABNORMAL HIGH (ref 19–32)
GFR, Est African American: 75 mL/min
Potassium: 3.9 mEq/L (ref 3.5–5.3)
Sodium: 144 mEq/L (ref 135–145)
Total Bilirubin: 0.2 mg/dL — ABNORMAL LOW (ref 0.3–1.2)
Total Protein: 6.6 g/dL (ref 6.0–8.3)

## 2011-11-11 LAB — HIV-1 RNA QUANT-NO REFLEX-BLD: HIV-1 RNA Quant, Log: <1.3 {Log} (ref <1.30)

## 2011-11-11 LAB — T-HELPER CELL (CD4) - (RCID CLINIC ONLY): CD4 T Cell Abs: 340 uL — ABNORMAL LOW (ref 400–2700)

## 2011-11-11 LAB — LIPID PANEL
Cholesterol: 269 mg/dL — ABNORMAL HIGH (ref 0–200)
LDL Cholesterol: 173 mg/dL — ABNORMAL HIGH (ref 0–99)
VLDL: 27 mg/dL (ref 0–40)

## 2011-11-24 ENCOUNTER — Ambulatory Visit: Payer: Medicare Other | Admitting: Infectious Diseases

## 2011-12-01 ENCOUNTER — Ambulatory Visit (INDEPENDENT_AMBULATORY_CARE_PROVIDER_SITE_OTHER): Payer: Medicare Other | Admitting: Infectious Diseases

## 2011-12-01 ENCOUNTER — Encounter: Payer: Self-pay | Admitting: Infectious Diseases

## 2011-12-01 VITALS — BP 188/80 | HR 66 | Temp 98.0°F | Ht 65.0 in | Wt 119.0 lb

## 2011-12-01 DIAGNOSIS — E785 Hyperlipidemia, unspecified: Secondary | ICD-10-CM

## 2011-12-01 DIAGNOSIS — E010 Iodine-deficiency related diffuse (endemic) goiter: Secondary | ICD-10-CM

## 2011-12-01 DIAGNOSIS — E049 Nontoxic goiter, unspecified: Secondary | ICD-10-CM

## 2011-12-01 DIAGNOSIS — B20 Human immunodeficiency virus [HIV] disease: Secondary | ICD-10-CM

## 2011-12-01 DIAGNOSIS — R51 Headache: Secondary | ICD-10-CM

## 2011-12-01 DIAGNOSIS — I10 Essential (primary) hypertension: Secondary | ICD-10-CM

## 2011-12-01 DIAGNOSIS — R519 Headache, unspecified: Secondary | ICD-10-CM | POA: Insufficient documentation

## 2011-12-01 MED ORDER — LISINOPRIL-HYDROCHLOROTHIAZIDE 10-12.5 MG PO TABS: 1.0000 | ORAL_TABLET | Freq: Every day | ORAL | Status: DC

## 2011-12-01 MED ORDER — HYDROCODONE-ACETAMINOPHEN 5-500 MG PO TABS: 1.0000 | ORAL_TABLET | Freq: Three times a day (TID) | ORAL | Status: DC | PRN

## 2011-12-01 NOTE — Assessment & Plan Note (Signed)
She is doing very well. Will get her into pap clinic. She is offered/refuses condoms. Will see her back in 6 months.

## 2011-12-01 NOTE — Progress Notes (Signed)
  Subjective:    Patient ID: Laurie Gray, female    DOB: December 24, 1955, 56 y.o.   MRN: [id number]  HPI 56 yo F with hx of dx 2000 HIV+, depression, hyperlipidemia, prev lap hysterecomy-BSO (2009). Has been taking atripla, previously took D4T cocktail, went to undetectable. Took herself off for 5 years. CD4 went under 200 and she was restarted. Feels jittery, has been unable to gain weight. Has been eating a lot. Having occasional migraines. Has previously used hydrocodone. Takes sporadically.   HIV 1 RNA Quant (copies/mL)  Date Value  11/10/2011 <20   06/04/2011 <20   10/31/2010 Comment: HIV 1 RNA not detected.      CD4 T Cell Abs (cmm)  Date Value  11/10/2011 340*  06/04/2011 520   10/31/2010 480       Review of Systems  Constitutional: Negative for appetite change and unexpected weight change.  Respiratory: Negative for chest tightness and shortness of breath.   Cardiovascular: Positive for palpitations. Negative for chest pain.  Gastrointestinal: Negative for diarrhea and constipation.  Genitourinary: Negative for difficulty urinating.  Neurological: Positive for headaches.  Psychiatric/Behavioral: Negative for sleep disturbance.       Objective:   Physical Exam  Constitutional: She appears well-developed and well-nourished.  Eyes: EOM are normal. Pupils are equal, round, and reactive to light.  Neck: Neck supple. Thyromegaly present.  Cardiovascular: Normal rate, regular rhythm and normal heart sounds.   Pulmonary/Chest: Effort normal and breath sounds normal.  Abdominal: Soft. Bowel sounds are normal. There is no tenderness.  Musculoskeletal: She exhibits no edema.  Psychiatric: Her mood appears anxious.          Assessment & Plan:

## 2011-12-01 NOTE — Assessment & Plan Note (Signed)
Encouraged her to restart her statin.

## 2011-12-01 NOTE — Assessment & Plan Note (Signed)
Will change her to ACE-HCTZ. rtc in 2 weeks for repeat lab. 2-3 months for BP recheck.

## 2011-12-01 NOTE — Assessment & Plan Note (Signed)
Wonder if BP, anxiety are due to thyroid. Will check her TSH today.

## 2011-12-01 NOTE — Assessment & Plan Note (Signed)
Will refill her vicodin

## 2011-12-15 ENCOUNTER — Other Ambulatory Visit: Payer: Medicare Other

## 2011-12-15 DIAGNOSIS — I10 Essential (primary) hypertension: Secondary | ICD-10-CM

## 2011-12-15 DIAGNOSIS — Z113 Encounter for screening for infections with a predominantly sexual mode of transmission: Secondary | ICD-10-CM

## 2011-12-16 LAB — BASIC METABOLIC PANEL
BUN: 16 mg/dL (ref 6–23)
Calcium: 11.2 mg/dL — ABNORMAL HIGH (ref 8.4–10.5)
Glucose, Bld: 116 mg/dL — ABNORMAL HIGH (ref 70–99)

## 2011-12-22 ENCOUNTER — Ambulatory Visit: Payer: Medicare Other

## 2012-01-12 ENCOUNTER — Ambulatory Visit (INDEPENDENT_AMBULATORY_CARE_PROVIDER_SITE_OTHER): Payer: Medicare Other | Admitting: *Deleted

## 2012-01-12 ENCOUNTER — Ambulatory Visit (INDEPENDENT_AMBULATORY_CARE_PROVIDER_SITE_OTHER): Payer: Medicare Other | Admitting: Internal Medicine

## 2012-01-12 ENCOUNTER — Other Ambulatory Visit (HOSPITAL_COMMUNITY)
Admission: RE | Admit: 2012-01-12 | Discharge: 2012-01-12 | Disposition: A | Discharge status: Home / Self Care | Payer: Medicare Other | Source: Ambulatory Visit | Attending: Infectious Disease | Admitting: Infectious Disease

## 2012-01-12 ENCOUNTER — Other Ambulatory Visit (INDEPENDENT_AMBULATORY_CARE_PROVIDER_SITE_OTHER): Payer: Medicare Other

## 2012-01-12 ENCOUNTER — Encounter: Payer: Self-pay | Admitting: *Deleted

## 2012-01-12 ENCOUNTER — Encounter: Payer: Self-pay | Admitting: Internal Medicine

## 2012-01-12 VITALS — BP 101/63 | HR 77 | Temp 98.9°F | Ht 65.5 in | Wt 119.8 lb

## 2012-01-12 DIAGNOSIS — R0981 Nasal congestion: Secondary | ICD-10-CM

## 2012-01-12 DIAGNOSIS — Z124 Encounter for screening for malignant neoplasm of cervix: Secondary | ICD-10-CM | POA: Insufficient documentation

## 2012-01-12 DIAGNOSIS — Z113 Encounter for screening for infections with a predominantly sexual mode of transmission: Secondary | ICD-10-CM | POA: Insufficient documentation

## 2012-01-12 DIAGNOSIS — Z1231 Encounter for screening mammogram for malignant neoplasm of breast: Secondary | ICD-10-CM

## 2012-01-12 DIAGNOSIS — E785 Hyperlipidemia, unspecified: Secondary | ICD-10-CM

## 2012-01-12 DIAGNOSIS — J029 Acute pharyngitis, unspecified: Secondary | ICD-10-CM

## 2012-01-12 DIAGNOSIS — J019 Acute sinusitis, unspecified: Secondary | ICD-10-CM

## 2012-01-12 DIAGNOSIS — Z0000 Encounter for general adult medical examination without abnormal findings: Secondary | ICD-10-CM

## 2012-01-12 LAB — RPR

## 2012-01-12 MED ORDER — OXYMETAZOLINE HCL 0.05 % NA SOLN: 2.0000 | Freq: Two times a day (BID) | NASAL | Status: AC

## 2012-01-12 MED ORDER — PRAVASTATIN SODIUM 20 MG PO TABS: 40.0000 mg | ORAL_TABLET | Freq: Every day | ORAL | Status: DC

## 2012-01-12 NOTE — Progress Notes (Signed)
Patient ID: Laurie Gray, female   DOB: October 27, 1955, 57 y.o.   MRN: [id number] Pt has tried saline gargle for sore throat without relief.  C/O nasal drainage and congestion x 4 days.  Requesting appt.  Appointment scheduled for today @ 2:30 pm.

## 2012-01-12 NOTE — Patient Instructions (Signed)
  Your results will be ready in about a week.  They will be available on My Chart.  Thank you for coming to the Center for your care.  Angelique Blonder, RN

## 2012-01-12 NOTE — Progress Notes (Signed)
Patient ID: Laurie Gray, female   DOB: 03-Jun-1955, 57 y.o.   MRN: [id number]     [facility]  Patient Active Problem List  Diagnosis  . HIV DISEASE  . HSV  . PITUITARY ADENOMA, BENIGN  . HYPERLIPIDEMIA  . DEPRESSION  . ESSENTIAL HYPERTENSION, BENIGN  . Acute URI  . BREAST MASS  . HAND PAIN, RIGHT  . OSTEOPENIA  . Cellulitis  . Contracture of finger joint  . Pharyngitis  . Thyromegaly  . Headache    Patient's Medications  New Prescriptions   OXYMETAZOLINE (AFRIN NASAL SPRAY) 0.05 % NASAL SPRAY    Place 2 sprays into the nose 2 (two) times daily.  Previous Medications   EMTRICITAB-RILPIVIR-TENOFOVIR 200-25-300 MG TABS    Take 1 tablet by mouth daily.   FISH OIL-OMEGA-3 FATTY ACIDS 1000 MG CAPSULE    Take 1 capsule (1 g total) by mouth daily.   HYDROCODONE-ACETAMINOPHEN (VICODIN) 5-500 MG PER TABLET    Take 2 tablets by mouth every 6 (six) hours as needed for pain.   HYDROCODONE-ACETAMINOPHEN (VICODIN) 5-500 MG PER TABLET    Take 1 tablet by mouth every 8 (eight) hours as needed (headache). pain   LISINOPRIL-HYDROCHLOROTHIAZIDE (PRINZIDE,ZESTORETIC) 10-12.5 MG PER TABLET    Take 1 tablet by mouth daily.   MULTIPLE VITAMINS-MINERALS (MULTIVITAMIN WITH MINERALS) TABLET    Take 1 tablet by mouth daily.     PROMETHAZINE (PHENERGAN) 25 MG TABLET    Take 25 mg by mouth every 8 (eight) hours as needed.    Modified Medications   Modified Medication Previous Medication   PRAVASTATIN (PRAVACHOL) 20 MG TABLET pravastatin (PRAVACHOL) 20 MG tablet      Take 2 tablets (40 mg total) by mouth daily.    Take 2 tablets (40 mg total) by mouth daily.  Discontinued Medications   HYDROCHLOROTHIAZIDE (HYDRODIURIL) 25 MG TABLET    Take 1 tablet (25 mg total) by mouth daily.    Subjective Laurie Gray is seen on a work in basis. She returned from a trip to [place] last week and developed a sore throat and sinus congestion with postnasal drip 3 days ago. She has not had any  fever, chills or sweats. She has not had any cough or shortness of breath. She did receive her influenza vaccine earlier in the fall.  She does not know the name of her HIV medication but does recognize Complera on the chart. She states that none of her family is aware of her HIV infection so she keeps her Complera in a vitamin C bottle. She believes she is missed only 2 doses since her visit with Dr. Ninetta Lights in November.  She has been a little lightheaded and dizzy upon standing recently. This began before she developed her cold symptoms.  She is a little concerned about her weight loss. She's lost about 25 pounds unintentionally over the past 2 years but is happy at her current weight. She is not having any vomiting or diarrhea. She has not had any intentional change in her diet or exercise.  She is currently not on her Pravachol. She thinks she is out of refills.  A long time female friend is interested in developing a relationship with her. She states that she has not been sexually active in about 15 years. She has many questions about the potential dangers of becoming sexually active. He is aware of her HIV infection and says that he has been tested and is negative.2  Objective: Temp: 98.9  F (37.2 C) (01/06 1434) Temp src: Oral (01/06 1434) BP: 101/63 mmHg (01/06 1434) Pulse Rate: 77  (01/06 1434)  General: She is in no distress. She has some chronic vocal dysphonia. Skin: No rash Oral: Moderate pharyngeal redness without exudates Lungs: Clear Cor: Regular S1 and S2 no murmurs  Lab Results HIV 1 RNA Quant (copies/mL)  Date Value  11/10/2011 <20   06/04/2011 <20   10/31/2010 Comment: HIV 1 RNA not detected.      CD4 T Cell Abs (cmm)  Date Value  11/10/2011 340*  06/04/2011 520   10/31/2010 480      Assessment: She probably has a viral upper respiratory infection. Her symptoms do not suggest influenza. I recommended treatment with Afrin nasal spray and ibuprofen as needed for  the next 4-5 days.  Her HIV infection is under good control and her CD4 count has slowly reconstituted over the last 2 years. I will have her continue Complera.  She is relatively hypotensive. I will have her stop hydrochlorothiazide and continue her combination tablet including lisinopril and hydrochlorothiazide.  I will have her restart Pravachol.  I think it is safe to monitor her weight and see if she continues to lose.  I talked to her about her concerns over coming sexually active again. I suggested her partner be tested again so she can be certain he is HIV negative. I talked to her about her treatment as prevention and also about the need to have him use condoms correctly and consistently if she does become sexually active.  Plan: 1. Continue Complera 2. Afrin nasal spray and ibuprofen as needed for the next 4-5 days 3. Restart Pravachol 4. Discontinue hydrochlorothiazide 5. Monitor weight 6. She is given written information on HIV treatment as prevention 7. She's given a referral for primary care 8. Followup Dr. Ninetta Lights on January 29   Cliffton Asters, MD [facility] [phone] pager   [phone] cell 01/12/2012, 3:09 PM

## 2012-01-12 NOTE — Progress Notes (Signed)
  Subjective:     Laurie Gray is a 57 y.o. woman who comes in today for a  pap smear only.  Previous abnormal Pap smears: no. Contraception: condoms  Pelvic Exam:  Pap smear obtained.   Assessment:    Screening pap smear.   Plan:    Follow up in one year, or as indicated by Pap results.   Pt given condoms and lubricant.  Pt given educational materials re:  Partner safety, PAP smears, heart disease, and ACA.  BSE handout given.  Ordered mammogram.

## 2012-01-12 NOTE — Patient Instructions (Signed)
Pt to return for "work-in visit" this afternoon.

## 2012-01-19 ENCOUNTER — Encounter: Payer: Self-pay | Admitting: *Deleted

## 2012-02-04 ENCOUNTER — Ambulatory Visit: Payer: Medicare Other | Admitting: Infectious Diseases

## 2012-02-04 ENCOUNTER — Telehealth: Payer: Self-pay | Admitting: *Deleted

## 2012-02-04 NOTE — Telephone Encounter (Signed)
Called patient and left voice mail to call the clinic to reschedule her appt, she no showed today.

## 2012-02-17 ENCOUNTER — Encounter: Payer: Self-pay | Admitting: Infectious Diseases

## 2012-02-17 ENCOUNTER — Ambulatory Visit (INDEPENDENT_AMBULATORY_CARE_PROVIDER_SITE_OTHER): Payer: Medicare Other | Admitting: Infectious Diseases

## 2012-02-17 VITALS — BP 165/97 | HR 72 | Temp 98.2°F | Wt 120.0 lb

## 2012-02-17 DIAGNOSIS — I10 Essential (primary) hypertension: Secondary | ICD-10-CM

## 2012-02-17 DIAGNOSIS — N898 Other specified noninflammatory disorders of vagina: Secondary | ICD-10-CM

## 2012-02-17 DIAGNOSIS — N939 Abnormal uterine and vaginal bleeding, unspecified: Secondary | ICD-10-CM

## 2012-02-17 DIAGNOSIS — B20 Human immunodeficiency virus [HIV] disease: Secondary | ICD-10-CM

## 2012-02-17 DIAGNOSIS — Z113 Encounter for screening for infections with a predominantly sexual mode of transmission: Secondary | ICD-10-CM

## 2012-02-17 DIAGNOSIS — E785 Hyperlipidemia, unspecified: Secondary | ICD-10-CM

## 2012-02-17 LAB — CBC
MCHC: 34.5 g/dL (ref 30.0–36.0)
RDW: 14 % (ref 11.5–15.5)

## 2012-02-17 LAB — COMPREHENSIVE METABOLIC PANEL
AST: 25 U/L (ref 0–37)
Albumin: 4.3 g/dL (ref 3.5–5.2)
Alkaline Phosphatase: 101 U/L (ref 39–117)
Potassium: 3.5 mEq/L (ref 3.5–5.3)
Sodium: 143 mEq/L (ref 135–145)
Total Protein: 6.9 g/dL (ref 6.0–8.3)

## 2012-02-17 LAB — LIPID PANEL
HDL: 69 mg/dL (ref >39)
LDL Cholesterol: 175 mg/dL — ABNORMAL HIGH (ref 0–99)

## 2012-02-17 LAB — RPR

## 2012-02-17 MED ORDER — EMTRICITAB-RILPIVIR-TENOFOV DF 200-25-300 MG PO TABS: 1.0000 | ORAL_TABLET | Freq: Every day | ORAL | Status: DC

## 2012-02-17 MED ORDER — ESTROGENS, CONJUGATED 0.625 MG/GM VA CREA: TOPICAL_CREAM | Freq: Every day | VAGINAL | Status: DC

## 2012-02-17 MED ORDER — OLMESARTAN MEDOXOMIL-HCTZ 20-12.5 MG PO TABS: 1.0000 | ORAL_TABLET | Freq: Every day | ORAL | Status: DC

## 2012-02-17 NOTE — Assessment & Plan Note (Signed)
Will give her trial of estrace. If not improved, GYN eval.

## 2012-02-17 NOTE — Addendum Note (Signed)
Addended by: Mariea Clonts D on: 02/17/2012 11:06 AM   Modules accepted: Orders

## 2012-02-17 NOTE — Progress Notes (Signed)
  Subjective:    Patient ID: Laurie Gray, female    DOB: 1955/12/12, 57 y.o.   MRN: [id number]  HPI 57 yo F with hx of dx 2000 HIV+, depression, hyperlipidemia, prev lap hysterecomy-BSO (2009). Has been taking atripla, previously took D4T cocktail, went to undetectable. Took herself off for 5 years. CD4 went under 200 and she was restarted on atripla. In July 2013 was changed to complera due to headaches.  She was seen January 2014 and was restated on her statin, given rx for non-infectious sinusitis. She was continued on her ACE-I/HCTZ.  Today she states she can't take lisinopril- made her feel sick (head felt foggy, AM headaches, can't think). She quit taking it and now feels better. Has friends who take Benicar and she wants to try this.  Has been taking complera without difficulty, no sfx. Has lost some wt, feels like it was due to fish oil  She has questions about re-starting vaginal sex with partner, getting married, after 12 years. She has had some vaginal bleeding after recent intercourse.   Review of Systems  Constitutional: Negative for fever and chills.  Cardiovascular: Negative for chest pain.  Gastrointestinal: Negative for diarrhea and constipation.  Genitourinary: Positive for vaginal bleeding. Negative for dysuria.  Neurological: Negative for headaches.       Objective:   Physical Exam  Constitutional: She appears well-developed and well-nourished.  HENT:  Mouth/Throat: No oropharyngeal exudate.  Eyes: EOM are normal. Pupils are equal, round, and reactive to light.  Neck: Neck supple.  Cardiovascular: Normal rate, regular rhythm and normal heart sounds.   Pulmonary/Chest: Effort normal and breath sounds normal.  Abdominal: Soft. Bowel sounds are normal. There is no tenderness.  Lymphadenopathy:    She has no cervical adenopathy.  Psychiatric: Her mood appears anxious.          Assessment & Plan:

## 2012-02-17 NOTE — Assessment & Plan Note (Signed)
She is doing well. Will refill her complera. She is given condoms. Will recheck her numbers today (a bit early). Her vax are uptodate.  Will see her back in 6 months.

## 2012-02-17 NOTE — Assessment & Plan Note (Addendum)
Will start on her benicar hct to see if she tolerates this any better than the lisinopril. Will see her back in 1 month for BP check.  Will get her established with PCP

## 2012-02-17 NOTE — Assessment & Plan Note (Signed)
Will continue on her statin, fish oil.

## 2012-02-18 LAB — T-HELPER CELL (CD4) - (RCID CLINIC ONLY): CD4 T Cell Abs: 440 uL (ref 400–2700)

## 2012-02-20 LAB — HIV-1 RNA QUANT-NO REFLEX-BLD: HIV-1 RNA Quant, Log: <1.3 {Log} (ref <1.30)

## 2012-03-07 ENCOUNTER — Other Ambulatory Visit: Payer: Self-pay | Admitting: Infectious Diseases

## 2012-04-07 ENCOUNTER — Encounter: Payer: Self-pay | Admitting: Internal Medicine

## 2012-04-07 ENCOUNTER — Ambulatory Visit (INDEPENDENT_AMBULATORY_CARE_PROVIDER_SITE_OTHER): Payer: Medicare Other | Admitting: Internal Medicine

## 2012-04-07 ENCOUNTER — Telehealth: Payer: Self-pay | Admitting: *Deleted

## 2012-04-07 VITALS — BP 147/84 | HR 79 | Temp 98.3°F | Wt 121.0 lb

## 2012-04-07 DIAGNOSIS — K0889 Other specified disorders of teeth and supporting structures: Secondary | ICD-10-CM

## 2012-04-07 DIAGNOSIS — K089 Disorder of teeth and supporting structures, unspecified: Secondary | ICD-10-CM

## 2012-04-07 MED ORDER — AMOXICILLIN 500 MG PO CAPS: 500.0000 mg | ORAL_CAPSULE | Freq: Three times a day (TID) | ORAL | Status: DC

## 2012-04-07 NOTE — Progress Notes (Signed)
She comes in with dental pain. She had pain that started close to a year ago and was seen in August of 2013 for this and did get started on penicillin at that time. She completed the course and was set up with dentistry however did miss the appointment. She comes in now 3 months later with continued pain. She has noted more inflammation.  HEENT: gum erythema of left lower region  A/P - I have prescribed her amoxicillin and have advised her to use ibuprofen. She will need to get into the dentist ASAP.

## 2012-04-07 NOTE — Telephone Encounter (Signed)
Was unable to come for Dental Clinic appt due to "school" conflict.  Believes her tooth is infected again, large cavity.  Requesting work-in appt.  Needs to find out how to get back in to the Dental Clinic schedule.

## 2012-04-07 NOTE — Assessment & Plan Note (Signed)
She will get amoxicillin and referred back to dentistry.

## 2012-06-30 ENCOUNTER — Other Ambulatory Visit: Payer: Self-pay | Admitting: Infectious Diseases

## 2012-07-13 ENCOUNTER — Other Ambulatory Visit: Payer: Self-pay | Admitting: Infectious Diseases

## 2012-07-26 ENCOUNTER — Telehealth: Payer: Self-pay | Admitting: *Deleted

## 2012-07-26 ENCOUNTER — Other Ambulatory Visit: Payer: Medicare Other

## 2012-07-26 DIAGNOSIS — B20 Human immunodeficiency virus [HIV] disease: Secondary | ICD-10-CM

## 2012-07-26 DIAGNOSIS — R309 Painful micturition, unspecified: Secondary | ICD-10-CM

## 2012-07-26 LAB — CBC WITH DIFFERENTIAL/PLATELET
Basophils Relative: 1 % (ref 0–1)
Eosinophils Absolute: 0.1 10*3/uL (ref 0.0–0.7)
Eosinophils Relative: 2 % (ref 0–5)
Hemoglobin: 11.6 g/dL — ABNORMAL LOW (ref 12.0–15.0)
MCH: 31 pg (ref 26.0–34.0)
MCHC: 33.8 g/dL (ref 30.0–36.0)
MCV: 91.7 fL (ref 78.0–100.0)
Monocytes Absolute: 0.2 10*3/uL (ref 0.1–1.0)
Monocytes Relative: 7 % (ref 3–12)
Neutrophils Relative %: 51 % (ref 43–77)

## 2012-07-26 LAB — COMPREHENSIVE METABOLIC PANEL
Alkaline Phosphatase: 101 U/L (ref 39–117)
BUN: 13 mg/dL (ref 6–23)
Creat: 0.89 mg/dL (ref 0.50–1.10)
Glucose, Bld: 105 mg/dL — ABNORMAL HIGH (ref 70–99)
Sodium: 143 mEq/L (ref 135–145)
Total Bilirubin: 0.3 mg/dL (ref 0.3–1.2)

## 2012-07-26 NOTE — Telephone Encounter (Signed)
Patient is in the clinic today for labs. She reports that she having burning and pain upon urination for about 4 days now with no relief. Asked Dr Drue Second about patient and told to add a UA to the patient lab work for the day. Advised the patient that when the UA is resulted someone will give her a call if she needs treatment. Also advised her if she is not better and has not heard from [place] by Wednesday 07/28/12 to give the clinic a call.

## 2012-07-27 LAB — HIV-1 RNA QUANT-NO REFLEX-BLD
HIV 1 RNA Quant: <20 copies/mL (ref <20)
HIV-1 RNA Quant, Log: <1.3 {Log} (ref <1.30)

## 2012-07-27 LAB — T-HELPER CELL (CD4) - (RCID CLINIC ONLY): CD4 T Cell Abs: 350 uL — ABNORMAL LOW (ref 400–2700)

## 2012-07-29 ENCOUNTER — Other Ambulatory Visit: Payer: Self-pay | Admitting: *Deleted

## 2012-07-29 DIAGNOSIS — N390 Urinary tract infection, site not specified: Secondary | ICD-10-CM

## 2012-07-29 LAB — URINE CULTURE: Colony Count: 25000

## 2012-07-29 MED ORDER — LEVOFLOXACIN 500 MG PO TABS: 500.0000 mg | ORAL_TABLET | Freq: Every day | ORAL | Status: DC

## 2012-08-04 ENCOUNTER — Other Ambulatory Visit: Payer: Self-pay | Admitting: Infectious Diseases

## 2012-08-09 ENCOUNTER — Ambulatory Visit: Payer: Medicare Other | Admitting: Infectious Diseases

## 2012-08-23 ENCOUNTER — Encounter: Payer: Self-pay | Admitting: Infectious Diseases

## 2012-08-23 ENCOUNTER — Ambulatory Visit (INDEPENDENT_AMBULATORY_CARE_PROVIDER_SITE_OTHER): Payer: Medicare Other | Admitting: Infectious Diseases

## 2012-08-23 VITALS — BP 113/74 | HR 60 | Temp 98.5°F | Ht 65.0 in | Wt 117.0 lb

## 2012-08-23 DIAGNOSIS — M25551 Pain in right hip: Secondary | ICD-10-CM | POA: Insufficient documentation

## 2012-08-23 DIAGNOSIS — B20 Human immunodeficiency virus [HIV] disease: Secondary | ICD-10-CM

## 2012-08-23 DIAGNOSIS — N390 Urinary tract infection, site not specified: Secondary | ICD-10-CM

## 2012-08-23 DIAGNOSIS — M25559 Pain in unspecified hip: Secondary | ICD-10-CM

## 2012-08-23 MED ORDER — CYCLOBENZAPRINE HCL 5 MG PO TABS: 5.0000 mg | ORAL_TABLET | Freq: Three times a day (TID) | ORAL | Status: DC | PRN

## 2012-08-23 NOTE — Addendum Note (Signed)
Addended by: Nelda Luckey C on: 08/23/2012 09:34 AM   Modules accepted: Orders

## 2012-08-23 NOTE — Assessment & Plan Note (Signed)
Will check MRI of her hip. Will give her rx for flexeril. See her back after MRI done.

## 2012-08-23 NOTE — Progress Notes (Signed)
  Subjective:    Patient ID: Laurie Gray, female    DOB: 1955-02-20, 57 y.o.   MRN: [id number]  HPI 57 yo F with hx of dx 2000 HIV+, depression, hyperlipidemia, prev lap hysterecomy-BSO (2009). Previously took D4T cocktail, went to undetectable. Took herself off for 5 years. CD4 went under 200 and she was restarted on atripla.   Has been taking HCTZ, benicar for her BP.  Today complains of R hip pain (constant, anterior and posterior). Was treated for proteus UTI end of July. Has ~ 3 miles every week day on treadmill.  Wakes up with slight headache, better after she has coffee.  C/o anxiety- has previously taken rx for this- xanax.   HIV 1 RNA Quant (copies/mL)  Date Value  07/26/2012 <20   02/17/2012 <20   11/10/2011 <20      CD4 T Cell Abs (cmm)  Date Value  07/26/2012 350*  02/17/2012 440   11/10/2011 340*      Review of Systems     Objective:   Physical Exam  Constitutional: She appears well-developed and well-nourished.  HENT:  Mouth/Throat: No oropharyngeal exudate.  Eyes: EOM are normal. Pupils are equal, round, and reactive to light.  Neck: Neck supple.  Cardiovascular: Normal rate, regular rhythm and normal heart sounds.   Pulmonary/Chest: Effort normal and breath sounds normal.  Abdominal: Soft. Bowel sounds are normal. There is no tenderness.  Musculoskeletal:  Has no pain with internal/external rotation of her hip.   Lymphadenopathy:    She has no cervical adenopathy.          Assessment & Plan:

## 2012-08-23 NOTE — Assessment & Plan Note (Signed)
She is doing well. Her partner is negative, he is getting tested routinely, they are using condoms. She is undetectable. Continue atripla.

## 2012-08-24 LAB — URINALYSIS, ROUTINE W REFLEX MICROSCOPIC
Leukocytes, UA: NEGATIVE
Protein, ur: NEGATIVE mg/dL
Urobilinogen, UA: 0.2 mg/dL (ref 0.0–1.0)

## 2012-08-25 ENCOUNTER — Telehealth: Payer: Self-pay | Admitting: *Deleted

## 2012-08-25 NOTE — Telephone Encounter (Signed)
Called and notified patient that per Dr. Drue Second as long as she is not having any symptoms her urine culture appears normal. She just recently finished treatment for a UTI. Laurie Gray

## 2012-08-26 LAB — URINE CULTURE

## 2012-08-27 ENCOUNTER — Ambulatory Visit (HOSPITAL_COMMUNITY)
Admission: RE | Admit: 2012-08-27 | Discharge: 2012-08-27 | Disposition: A | Discharge status: Home / Self Care | Payer: Medicare Other | Source: Ambulatory Visit | Attending: Infectious Diseases | Admitting: Infectious Diseases

## 2012-08-27 DIAGNOSIS — M25559 Pain in unspecified hip: Secondary | ICD-10-CM | POA: Insufficient documentation

## 2012-08-27 DIAGNOSIS — M25551 Pain in right hip: Secondary | ICD-10-CM

## 2012-08-30 ENCOUNTER — Other Ambulatory Visit: Payer: Self-pay | Admitting: Infectious Diseases

## 2012-08-30 ENCOUNTER — Telehealth: Payer: Self-pay | Admitting: *Deleted

## 2012-08-30 DIAGNOSIS — N390 Urinary tract infection, site not specified: Secondary | ICD-10-CM

## 2012-08-30 MED ORDER — LEVOFLOXACIN 500 MG PO TABS: 500.0000 mg | ORAL_TABLET | Freq: Every day | ORAL | Status: DC

## 2012-08-30 NOTE — Progress Notes (Signed)
Patient notified, she is going to get the Levaquin. She was concerned because she just finished the same antibiotic in July x 5 days. But she is going to try this and I also advised her to increase her water intake. She has a f/u appt 09/01/12 and will see Dr. Ninetta Lights at that time. Wendall Mola CMA

## 2012-08-30 NOTE — Progress Notes (Signed)
Called pt and left message that she had bacteruria. levaquin sent to her pharmacy.

## 2012-08-30 NOTE — Telephone Encounter (Signed)
Per Dr. Ninetta Lights patient's MRI results are normal. She is still having pain but she thinks it may be from some walking she did with a friend that was more strenuous than she is used to. She received Dr. Moshe Cipro voicemail and will pick up the prescribed antibiotic. Wendall Mola

## 2012-09-01 ENCOUNTER — Ambulatory Visit: Payer: Medicare Other | Admitting: Infectious Diseases

## 2012-09-01 ENCOUNTER — Telehealth: Payer: Self-pay | Admitting: *Deleted

## 2012-09-01 NOTE — Telephone Encounter (Signed)
Patient notified via voice mail this is her 3rd no show starting 09/04/12 and she now needs to be on our walk in list. Left message stating she can speak to anyone in clinic for specifics. Wendall Mola

## 2012-10-06 ENCOUNTER — Emergency Department (HOSPITAL_COMMUNITY)
Admission: EM | Admit: 2012-10-06 | Discharge: 2012-10-06 | Disposition: A | Discharge status: Home / Self Care | Payer: Medicare Other | Attending: Emergency Medicine | Admitting: Emergency Medicine

## 2012-10-06 ENCOUNTER — Encounter (HOSPITAL_COMMUNITY): Payer: Self-pay | Admitting: Emergency Medicine

## 2012-10-06 ENCOUNTER — Emergency Department (HOSPITAL_COMMUNITY): Payer: Medicare Other

## 2012-10-06 DIAGNOSIS — IMO0002 Reserved for concepts with insufficient information to code with codable children: Secondary | ICD-10-CM | POA: Insufficient documentation

## 2012-10-06 DIAGNOSIS — S92919A Unspecified fracture of unspecified toe(s), initial encounter for closed fracture: Secondary | ICD-10-CM | POA: Insufficient documentation

## 2012-10-06 DIAGNOSIS — Y9389 Activity, other specified: Secondary | ICD-10-CM | POA: Insufficient documentation

## 2012-10-06 DIAGNOSIS — S92912A Unspecified fracture of left toe(s), initial encounter for closed fracture: Secondary | ICD-10-CM

## 2012-10-06 DIAGNOSIS — Y929 Unspecified place or not applicable: Secondary | ICD-10-CM | POA: Insufficient documentation

## 2012-10-06 DIAGNOSIS — I10 Essential (primary) hypertension: Secondary | ICD-10-CM | POA: Insufficient documentation

## 2012-10-06 DIAGNOSIS — Z79899 Other long term (current) drug therapy: Secondary | ICD-10-CM | POA: Insufficient documentation

## 2012-10-06 DIAGNOSIS — E785 Hyperlipidemia, unspecified: Secondary | ICD-10-CM | POA: Insufficient documentation

## 2012-10-06 DIAGNOSIS — Z21 Asymptomatic human immunodeficiency virus [HIV] infection status: Secondary | ICD-10-CM | POA: Insufficient documentation

## 2012-10-06 MED ORDER — OXYCODONE-ACETAMINOPHEN 5-325 MG PO TABS: 2.0000 | ORAL_TABLET | Freq: Four times a day (QID) | ORAL | Status: DC | PRN

## 2012-10-06 NOTE — ED Provider Notes (Signed)
Medical screening examination/treatment/procedure(s) were performed by non-physician practitioner and as supervising physician I was immediately available for consultation/collaboration.  Juliet Rude. Rubin Payor, MD 10/06/12 [phone]

## 2012-10-06 NOTE — ED Notes (Signed)
Pt states that she stubbed her toe this morning and thinks she broke it.  C/o lt great toe pain.

## 2012-10-06 NOTE — ED Provider Notes (Signed)
CSN: [id number]     Arrival date & time 10/06/12  1302 History  This chart was scribed for Roxy Horseman, PA, working with Harrold Donath R. Rubin Payor, MD, by Ardelia Mems ED Scribe. This patient was seen in room WTR6/WTR6 and the patient's care was started at 2:29 PM.   Chief Complaint  Patient presents with  . Toe Pain    The history is provided by the patient. No language interpreter was used.    HPI Comments: Laurie Gray is a 57 y.o. Female with a history of HIV who presents to the Emergency Department complaining of constant, moderate left great toe pain that radiates up her left leg over the past 3 days. She states that she stumped her toe 3 days ago, and again she stumped the toe today, worsening her pain. She states that she is "clumsy", causing her to repeatedly stump her toe. She states she has taken [facility] powder without relief. She has buddy taped the toe. She states that the toe is pointing to the side, and she states that she believes the toe may be broken. She denies any other pain or symptoms.   Past Medical History  Diagnosis Date  . HIV infection   . Hyperlipidemia   . Hypertension    Past Surgical History  Procedure Laterality Date  . Abdominal hysterectomy     Family History  Problem Relation Age of Onset  . Breast cancer Mother   . Hypertension Mother   . Cancer Mother   . Hyperlipidemia Mother    History  Substance Use Topics  . Smoking status: Never Smoker   . Smokeless tobacco: Never Used  . Alcohol Use: No     Comment: rarely   OB History   Grav Para Term Preterm Abortions TAB SAB Ect Mult Living                 Review of Systems  All other systems reviewed and are negative.  A complete 10 system review of systems was obtained and all systems are negative except as noted in the HPI and PMH.    Allergies  Sulfonamide derivatives  Home Medications   Current Outpatient Rx  Name  Route  Sig  Dispense  Refill  . cyclobenzaprine (FLEXERIL) 5 MG  tablet   Oral   Take 1 tablet (5 mg total) by mouth 3 (three) times daily as needed for muscle spasms.   30 tablet   0   . efavirenz-emtricitabine-tenofovir (ATRIPLA) 600-200-300 MG per tablet   Oral   Take 1 tablet by mouth at bedtime.         . fluticasone (FLONASE) 50 MCG/ACT nasal spray   Each Nare   Place 1 spray into both nostrils as needed for rhinitis or allergies.         . Multiple Vitamins-Minerals (MULTIVITAMIN WITH MINERALS) tablet   Oral   Take 1 tablet by mouth daily.           Marland Kitchen olmesartan-hydrochlorothiazide (BENICAR HCT) 20-12.5 MG per tablet   Oral   Take 1 tablet by mouth daily.         Marland Kitchen omega-3 acid ethyl esters (LOVAZA) 1 G capsule   Oral   Take 1 g by mouth every evening.         . pravastatin (PRAVACHOL) 20 MG tablet   Oral   Take 2 tablets (40 mg total) by mouth daily.   180 tablet   3   . promethazine (PHENERGAN)  25 MG tablet   Oral   Take 25 mg by mouth every 8 (eight) hours as needed for nausea.           Triage Vitals: BP 111/64  Pulse 84  Temp(Src) 98.2 F (36.8 C) (Oral)  Resp 16  SpO2 100%  Physical Exam  Nursing note and vitals reviewed. Constitutional: She is oriented to person, place, and time. She appears well-developed and well-nourished. No distress.  HENT:  Head: Normocephalic and atraumatic.  Eyes: EOM are normal.  Neck: Neck supple. No tracheal deviation present.  Cardiovascular: Normal rate and intact distal pulses.   Brisk capillary refill.  Pulmonary/Chest: Effort normal. No respiratory distress.  Musculoskeletal: Normal range of motion.  Moderate swelling and tenderness to the 3rd left toe. No obvious bony abnormality or deformity.  Neurological: She is alert and oriented to person, place, and time.  Skin: Skin is warm and dry.  Psychiatric: She has a normal mood and affect. Her behavior is normal.    ED Course  Procedures (including critical care time)  DIAGNOSTIC STUDIES: Oxygen Saturation is  100% on RA, normal by my interpretation.    COORDINATION OF CARE: 2:33 PM- Discussed plan to obtain an X-ray. Pt advised of plan for treatment and pt agrees.  Labs Review Labs Reviewed - No data to display Imaging Review Dg Toe 3rd Left  10/06/2012   CLINICAL DATA:  Stubbed 3rd toe, pain throughout toe  EXAM: LEFT THIRD TOE  COMPARISON:  None  FINDINGS: Osseous mineralization normal.  Joint spaces preserved.  Oblique minimally displaced fracture at the base of the proximal phalanx left 3rd toe.  No articular extension.  No additional fracture dislocation identified.  IMPRESSION: Minimally displaced oblique fracture of the proximal phalanx left 3rd toe.   Electronically Signed   By: Ulyses Southward M.D.   On: 10/06/2012 15:29    MDM   1. Toe fracture, left, closed, initial encounter     Patient with toe fracture.  Buddy tape, post op shoe, and pain meds.  Ortho follow-up.  I personally performed the services described in this documentation, which was scribed in my presence. The recorded information has been reviewed and is accurate.     Roxy Horseman, PA-C 10/06/12 1546

## 2012-10-11 ENCOUNTER — Ambulatory Visit (INDEPENDENT_AMBULATORY_CARE_PROVIDER_SITE_OTHER): Payer: Medicare Other | Admitting: Infectious Diseases

## 2012-10-11 ENCOUNTER — Encounter: Payer: Self-pay | Admitting: Infectious Diseases

## 2012-10-11 VITALS — BP 128/79 | HR 77 | Temp 98.1°F | Ht 65.0 in | Wt 126.0 lb

## 2012-10-11 DIAGNOSIS — M24549 Contracture, unspecified hand: Secondary | ICD-10-CM

## 2012-10-11 DIAGNOSIS — B20 Human immunodeficiency virus [HIV] disease: Secondary | ICD-10-CM

## 2012-10-11 DIAGNOSIS — Z79899 Other long term (current) drug therapy: Secondary | ICD-10-CM

## 2012-10-11 DIAGNOSIS — I10 Essential (primary) hypertension: Secondary | ICD-10-CM

## 2012-10-11 DIAGNOSIS — Z113 Encounter for screening for infections with a predominantly sexual mode of transmission: Secondary | ICD-10-CM

## 2012-10-11 DIAGNOSIS — E785 Hyperlipidemia, unspecified: Secondary | ICD-10-CM

## 2012-10-11 DIAGNOSIS — M24542 Contracture, left hand: Secondary | ICD-10-CM

## 2012-10-11 DIAGNOSIS — Z23 Encounter for immunization: Secondary | ICD-10-CM

## 2012-10-11 NOTE — Assessment & Plan Note (Signed)
doing much better now. Continue benicar.

## 2012-10-11 NOTE — Assessment & Plan Note (Signed)
Taking statin, fish oil without problem

## 2012-10-11 NOTE — Assessment & Plan Note (Signed)
She is doing well, will see her back in 3-4 months with labs prior. She is given flu shot, will set up for mammo.

## 2012-10-11 NOTE — Assessment & Plan Note (Signed)
Will get her back in with hand surgeon.

## 2012-10-11 NOTE — Progress Notes (Signed)
  Subjective:    Patient ID: Laurie Gray, female    DOB: [birth year], 57 y.o.   MRN: [id number]  HPI 57 yo F with hx of dx 2000 HIV+, depression, hyperlipidemia, prev lap hysterecomy-BSO (2009). Previously took D4T cocktail, went to undetectable. Took herself off for 5 years. CD4 went under 200 and she was restarted on atripla.  Has been taking HCTZ, benicar for her BP.  Broke toes on L foot while walking around in the dark. Was wrapped and has been in shoe ever since.  No problems with ART.  Gets flu shot today.  Has had odor to her urine, less clear. Worried she may have infection. No frequency, no dysuria. No fever or chills (except when she broke her toes). BP has been "great since benicar".   Due for mammogram Had colonoscopy last year- 1 benign polyp.   HIV 1 RNA Quant (copies/mL)  Date Value  07/26/2012 <20   02/17/2012 <20   11/10/2011 <20      CD4 T Cell Abs (cmm)  Date Value  07/26/2012 350*  02/17/2012 440   11/10/2011 340*    Review of Systems  Constitutional: Negative for fever and chills.  Gastrointestinal: Negative for diarrhea and constipation.  Endocrine: Negative for polyuria.  Genitourinary: Negative for dysuria and frequency.       Objective:   Physical Exam  Constitutional: She appears well-developed and well-nourished.  HENT:  Mouth/Throat: No oropharyngeal exudate.  Eyes: EOM are normal. Pupils are equal, round, and reactive to light.  Neck: Neck supple.  Cardiovascular: Normal rate, regular rhythm and normal heart sounds.   Pulmonary/Chest: Effort normal and breath sounds normal.  Abdominal: Soft. Bowel sounds are normal. She exhibits no distension. There is no tenderness.  Lymphadenopathy:    She has no cervical adenopathy.          Assessment & Plan:

## 2012-10-15 ENCOUNTER — Ambulatory Visit (HOSPITAL_COMMUNITY)
Admission: RE | Admit: 2012-10-15 | Discharge: 2012-10-15 | Disposition: A | Discharge status: Home / Self Care | Payer: Medicare Other | Source: Ambulatory Visit | Attending: Infectious Diseases | Admitting: Infectious Diseases

## 2012-10-15 DIAGNOSIS — B20 Human immunodeficiency virus [HIV] disease: Secondary | ICD-10-CM

## 2012-10-15 DIAGNOSIS — Z1231 Encounter for screening mammogram for malignant neoplasm of breast: Secondary | ICD-10-CM | POA: Insufficient documentation

## 2012-11-01 ENCOUNTER — Other Ambulatory Visit: Payer: Self-pay | Admitting: Internal Medicine

## 2012-11-21 ENCOUNTER — Encounter (HOSPITAL_COMMUNITY): Payer: Self-pay | Admitting: Emergency Medicine

## 2012-11-21 ENCOUNTER — Emergency Department (HOSPITAL_COMMUNITY)
Admission: EM | Admit: 2012-11-21 | Discharge: 2012-11-21 | Disposition: A | Discharge status: Home / Self Care | Payer: Medicare Other | Attending: Emergency Medicine | Admitting: Emergency Medicine

## 2012-11-21 DIAGNOSIS — I10 Essential (primary) hypertension: Secondary | ICD-10-CM | POA: Insufficient documentation

## 2012-11-21 DIAGNOSIS — E785 Hyperlipidemia, unspecified: Secondary | ICD-10-CM | POA: Insufficient documentation

## 2012-11-21 DIAGNOSIS — Z21 Asymptomatic human immunodeficiency virus [HIV] infection status: Secondary | ICD-10-CM | POA: Insufficient documentation

## 2012-11-21 DIAGNOSIS — Y9389 Activity, other specified: Secondary | ICD-10-CM | POA: Insufficient documentation

## 2012-11-21 DIAGNOSIS — IMO0002 Reserved for concepts with insufficient information to code with codable children: Secondary | ICD-10-CM | POA: Insufficient documentation

## 2012-11-21 DIAGNOSIS — X58XXXA Exposure to other specified factors, initial encounter: Secondary | ICD-10-CM | POA: Insufficient documentation

## 2012-11-21 DIAGNOSIS — Z87828 Personal history of other (healed) physical injury and trauma: Secondary | ICD-10-CM | POA: Insufficient documentation

## 2012-11-21 DIAGNOSIS — Y929 Unspecified place or not applicable: Secondary | ICD-10-CM | POA: Insufficient documentation

## 2012-11-21 DIAGNOSIS — Z79899 Other long term (current) drug therapy: Secondary | ICD-10-CM | POA: Insufficient documentation

## 2012-11-21 DIAGNOSIS — S058X9A Other injuries of unspecified eye and orbit, initial encounter: Secondary | ICD-10-CM | POA: Insufficient documentation

## 2012-11-21 DIAGNOSIS — S0501XA Injury of conjunctiva and corneal abrasion without foreign body, right eye, initial encounter: Secondary | ICD-10-CM

## 2012-11-21 MED ORDER — FLUORESCEIN SODIUM 1 MG OP STRP
1.0000 | ORAL_STRIP | Freq: Once | OPHTHALMIC | Status: AC
  Administered 2012-11-21: 1 via OPHTHALMIC
  Filled 2012-11-21: qty 1

## 2012-11-21 MED ORDER — ERYTHROMYCIN 5 MG/GM OP OINT
1.0000 "application " | TOPICAL_OINTMENT | Freq: Once | OPHTHALMIC | Status: AC
  Administered 2012-11-21: 1 via OPHTHALMIC
  Filled 2012-11-21: qty 3.5

## 2012-11-21 MED ORDER — PROPARACAINE HCL 0.5 % OP SOLN
1.0000 [drp] | Freq: Once | OPHTHALMIC | Status: AC
  Administered 2012-11-21: 1 [drp] via OPHTHALMIC
  Filled 2012-11-21: qty 15

## 2012-11-21 MED ORDER — ERYTHROMYCIN 5 MG/GM OP OINT: TOPICAL_OINTMENT | OPHTHALMIC | Status: DC

## 2012-11-21 MED ORDER — TRAMADOL HCL 50 MG PO TABS: 50.0000 mg | ORAL_TABLET | Freq: Four times a day (QID) | ORAL | Status: DC | PRN

## 2012-11-21 NOTE — ED Provider Notes (Signed)
CSN: [id number]     Arrival date & time 11/21/12  2014 History   First MD Initiated Contact with Patient 11/21/12 2030     Chief Complaint  Patient presents with  . Eye Injury   (Consider location/radiation/quality/duration/timing/severity/associated sxs/prior Treatment) HPI  Laurie Gray is a 57 y.o. female complaining of complaining of right eye injury. She injured herself with fingernail while putting makeup this morning.  She is complaining of dull  periorbital pain, blurriness, sensitivity to light and mild HA. She took hydrocodone for pain that she has for injured toe week ago and seems liked helped to relieve pain.  She had applied soft gel eye drop 3 to 4 times.  Patient has reduced vision in the left eye and she is concerned that she will lose vision in her right eye. She did not recalled her ophthalmologist name. she denies any fever or chills.  Past Medical History  Diagnosis Date  . HIV infection   . Hyperlipidemia   . Hypertension    Past Surgical History  Procedure Laterality Date  . Abdominal hysterectomy     Family History  Problem Relation Age of Onset  . Breast cancer Mother   . Hypertension Mother   . Cancer Mother   . Hyperlipidemia Mother    History  Substance Use Topics  . Smoking status: Never Smoker   . Smokeless tobacco: Never Used  . Alcohol Use: No     Comment: rarely   OB History   Grav Para Term Preterm Abortions TAB SAB Ect Mult Living                 Review of Systems 10 systems reviewed and found to be negative, except as noted in the HPI  Allergies  Sulfonamide derivatives  Home Medications   Current Outpatient Rx  Name  Route  Sig  Dispense  Refill  . Aspirin-Salicylamide-Caffeine (BC HEADACHE POWDER PO)   Oral   Take 1 Package by mouth 2 (two) times daily as needed (for pain).         Marland Kitchen efavirenz-emtricitabine-tenofovir (ATRIPLA) 600-200-300 MG per tablet   Oral   Take 1 tablet by mouth at bedtime.         .  fluticasone (FLONASE) 50 MCG/ACT nasal spray   Each Nare   Place 1 spray into both nostrils as needed for rhinitis or allergies.         . Multiple Vitamins-Minerals (MULTIVITAMIN WITH MINERALS) tablet   Oral   Take 1 tablet by mouth every morning.          . olmesartan-hydrochlorothiazide (BENICAR HCT) 20-12.5 MG per tablet   Oral   Take 1 tablet by mouth every morning.         Marland Kitchen omega-3 acid ethyl esters (LOVAZA) 1 G capsule   Oral   Take 1 g by mouth every evening.         Marland Kitchen oxyCODONE-acetaminophen (PERCOCET/ROXICET) 5-325 MG per tablet   Oral   Take 2 tablets by mouth every 6 (six) hours as needed for severe pain.         . pravastatin (PRAVACHOL) 20 MG tablet   Oral   Take 2 tablets (40 mg total) by mouth daily.   180 tablet   3   . erythromycin ophthalmic ointment      apply a 1 cm ribbon to the lower eye 6 times a day for 7-10 days.   3.5 g   0   .  traMADol (ULTRAM) 50 MG tablet   Oral   Take 1 tablet (50 mg total) by mouth every 6 (six) hours as needed.   15 tablet   0    BP 102/70  Pulse 55  Temp(Src) 98.5 F (36.9 C) (Oral)  Resp 18  SpO2 99% Physical Exam  Nursing note and vitals reviewed. Constitutional: She is oriented to person, place, and time. She appears well-developed and well-nourished. No distress.  HENT:  Head: Normocephalic.  Eyes: EOM are normal. Pupils are equal, round, and reactive to light. Right eye exhibits no chemosis. Right conjunctiva is not injected. Right conjunctiva has a hemorrhage. No scleral icterus. Right eye exhibits normal extraocular motion and no nystagmus.  Slit lamp exam:      The right eye shows corneal abrasion. The right eye shows no corneal flare and no corneal ulcer.    Right eye shows a medial subconjunctival hemorrhage. Extraocular movement is intact. There are no cells and flare on slit lamp exam. There is a small corneal abrasion at the center of the hemorrhage.  Cardiovascular: Normal rate.     Pulmonary/Chest: Effort normal and breath sounds normal. No stridor. No respiratory distress. She has no wheezes. She has no rales. She exhibits no tenderness.  Abdominal: Soft. Bowel sounds are normal.  Musculoskeletal: Normal range of motion.  Neurological: She is alert and oriented to person, place, and time.  Psychiatric: She has a normal mood and affect.    ED Course  Procedures (including critical care time) Labs Review Labs Reviewed - No data to display Imaging Review No results found.  EKG Interpretation   None       MDM   1. Corneal abrasion, right, initial encounter      Filed Vitals:   11/21/12 2118 11/21/12 2237  BP: 109/76 102/70  Pulse: 51 55  Temp: 98 F (36.7 C) 98.5 F (36.9 C)  TempSrc: Oral Oral  Resp: 18 18  SpO2: 99% 99%     Laurie Gray is a 57 y.o. female presenting for evaluation after trauma to right eye with fingernail this morning. Patient reports a pain, clear discharge blurred vision and mild light sensitivity. Patient's has a subconjunctival hemorrhage and a small corneal abrasion. She will be given erythromycin ointment. I have advised her on how to apply the ointment and have also instructed her to follow with her ophthalmologist in the next several days. Return precautions discussed.  Medications  proparacaine (ALCAINE) 0.5 % ophthalmic solution 1 drop (1 drop Right Eye Given 11/21/12 2052)  fluorescein ophthalmic strip 1 strip (1 strip Both Eyes Given 11/21/12 2051)  erythromycin ophthalmic ointment 1 application (1 application Right Eye Given 11/21/12 2216)    Pt is hemodynamically stable, appropriate for, and amenable to discharge at this time. Pt verbalized understanding and agrees with care plan. All questions answered. Outpatient follow-up and specific return precautions discussed.    Discharge Medication List as of 11/21/2012 10:14 PM    START taking these medications   Details  erythromycin ophthalmic ointment apply a 1  cm ribbon to the lower eye 6 times a day for 7-10 days., Print    traMADol (ULTRAM) 50 MG tablet Take 1 tablet (50 mg total) by mouth every 6 (six) hours as needed., Starting 11/21/2012, Until Discontinued, Print        Note: Portions of this report may have been transcribed using voice recognition software. Every effort was made to ensure accuracy; however, inadvertent computerized transcription errors may be  present      Wynetta Emery, PA-C 11/22/12 0101

## 2012-11-21 NOTE — ED Notes (Signed)
Pt jabbed herself accidentally in rt eye this am.  Eye is red and swollen.  Very painful.  Vision is blurred.

## 2012-11-21 NOTE — ED Notes (Signed)
MD at bedside. 

## 2012-11-25 NOTE — ED Provider Notes (Signed)
Medical screening examination/treatment/procedure(s) were performed by non-physician practitioner and as supervising physician I was immediately available for consultation/collaboration.    Advaith Lamarque L Virginia Francisco, MD 11/25/12 0718 

## 2012-12-09 ENCOUNTER — Other Ambulatory Visit: Payer: Self-pay | Admitting: Internal Medicine

## 2013-01-11 ENCOUNTER — Other Ambulatory Visit: Payer: Commercial Managed Care - HMO

## 2013-01-11 DIAGNOSIS — Z79899 Other long term (current) drug therapy: Secondary | ICD-10-CM

## 2013-01-11 DIAGNOSIS — B20 Human immunodeficiency virus [HIV] disease: Secondary | ICD-10-CM

## 2013-01-11 DIAGNOSIS — Z113 Encounter for screening for infections with a predominantly sexual mode of transmission: Secondary | ICD-10-CM

## 2013-01-11 LAB — CBC WITH DIFFERENTIAL/PLATELET
BASOS ABS: 0 10*3/uL (ref 0.0–0.1)
BASOS PCT: 1 % (ref 0–1)
Eosinophils Absolute: 0 10*3/uL (ref 0.0–0.7)
Eosinophils Relative: 1 % (ref 0–5)
HEMATOCRIT: 34.5 % — AB (ref 36.0–46.0)
HEMOGLOBIN: 11.6 g/dL — AB (ref 12.0–15.0)
Lymphocytes Relative: 34 % (ref 12–46)
Lymphs Abs: 1.1 10*3/uL (ref 0.7–4.0)
MCH: 30.5 pg (ref 26.0–34.0)
MCHC: 33.6 g/dL (ref 30.0–36.0)
MCV: 90.8 fL (ref 78.0–100.0)
MONO ABS: 0.3 10*3/uL (ref 0.1–1.0)
Monocytes Relative: 10 % (ref 3–12)
NEUTROS ABS: 1.8 10*3/uL (ref 1.7–7.7)
Neutrophils Relative %: 54 % (ref 43–77)
Platelets: 225 10*3/uL (ref 150–400)
RBC: 3.8 MIL/uL — ABNORMAL LOW (ref 3.87–5.11)
RDW: 14.2 % (ref 11.5–15.5)
WBC: 3.2 10*3/uL — ABNORMAL LOW (ref 4.0–10.5)

## 2013-01-11 LAB — COMPLETE METABOLIC PANEL WITH GFR
ALBUMIN: 4.1 g/dL (ref 3.5–5.2)
ALK PHOS: 118 U/L — AB (ref 39–117)
ALT: 22 U/L (ref 0–35)
AST: 26 U/L (ref 0–37)
BUN: 12 mg/dL (ref 6–23)
CALCIUM: 11 mg/dL — AB (ref 8.4–10.5)
CHLORIDE: 107 meq/L (ref 96–112)
CO2: 31 mEq/L (ref 19–32)
Creat: 0.94 mg/dL (ref 0.50–1.10)
GFR, EST NON AFRICAN AMERICAN: 68 mL/min
GFR, Est African American: 78 mL/min
GLUCOSE: 77 mg/dL (ref 70–99)
POTASSIUM: 4.1 meq/L (ref 3.5–5.3)
Sodium: 142 mEq/L (ref 135–145)
Total Bilirubin: 0.3 mg/dL (ref 0.3–1.2)
Total Protein: 6.4 g/dL (ref 6.0–8.3)

## 2013-01-11 LAB — LIPID PANEL
Cholesterol: 226 mg/dL — ABNORMAL HIGH (ref 0–200)
HDL: 72 mg/dL (ref >39)
LDL Cholesterol: 138 mg/dL — ABNORMAL HIGH (ref 0–99)
TRIGLYCERIDES: 81 mg/dL (ref <150)
Total CHOL/HDL Ratio: 3.1 Ratio
VLDL: 16 mg/dL (ref 0–40)

## 2013-01-12 LAB — RPR

## 2013-01-13 LAB — HIV-1 RNA QUANT-NO REFLEX-BLD
HIV 1 RNA Quant: <20 copies/mL (ref <20)
HIV-1 RNA Quant, Log: <1.3 {Log} (ref <1.30)

## 2013-01-13 LAB — T-HELPER CELL (CD4) - (RCID CLINIC ONLY)
CD4 % Helper T Cell: 36 % (ref 33–55)
CD4 T CELL ABS: 400 /uL (ref 400–2700)

## 2013-02-14 ENCOUNTER — Ambulatory Visit (INDEPENDENT_AMBULATORY_CARE_PROVIDER_SITE_OTHER): Payer: Commercial Managed Care - HMO | Admitting: Infectious Diseases

## 2013-02-14 ENCOUNTER — Other Ambulatory Visit (HOSPITAL_COMMUNITY)
Admission: RE | Admit: 2013-02-14 | Discharge: 2013-02-14 | Disposition: A | Discharge status: Home / Self Care | Payer: Medicare Other | Source: Ambulatory Visit | Attending: Infectious Diseases | Admitting: Infectious Diseases

## 2013-02-14 ENCOUNTER — Other Ambulatory Visit: Payer: Self-pay | Admitting: Infectious Diseases

## 2013-02-14 ENCOUNTER — Encounter: Payer: Self-pay | Admitting: Infectious Diseases

## 2013-02-14 VITALS — BP 128/80 | HR 63 | Temp 98.9°F | Wt 117.0 lb

## 2013-02-14 DIAGNOSIS — G43909 Migraine, unspecified, not intractable, without status migrainosus: Secondary | ICD-10-CM

## 2013-02-14 DIAGNOSIS — N939 Abnormal uterine and vaginal bleeding, unspecified: Secondary | ICD-10-CM

## 2013-02-14 DIAGNOSIS — K0889 Other specified disorders of teeth and supporting structures: Secondary | ICD-10-CM

## 2013-02-14 DIAGNOSIS — Z113 Encounter for screening for infections with a predominantly sexual mode of transmission: Secondary | ICD-10-CM | POA: Insufficient documentation

## 2013-02-14 DIAGNOSIS — R300 Dysuria: Secondary | ICD-10-CM

## 2013-02-14 DIAGNOSIS — M79609 Pain in unspecified limb: Secondary | ICD-10-CM

## 2013-02-14 DIAGNOSIS — K089 Disorder of teeth and supporting structures, unspecified: Secondary | ICD-10-CM

## 2013-02-14 DIAGNOSIS — B20 Human immunodeficiency virus [HIV] disease: Secondary | ICD-10-CM

## 2013-02-14 DIAGNOSIS — N898 Other specified noninflammatory disorders of vagina: Secondary | ICD-10-CM

## 2013-02-14 MED ORDER — TRAMADOL HCL 50 MG PO TABS: 50.0000 mg | ORAL_TABLET | Freq: Two times a day (BID) | ORAL | Status: DC | PRN

## 2013-02-14 NOTE — Assessment & Plan Note (Signed)
Will have her seen by dental.  

## 2013-02-14 NOTE — Assessment & Plan Note (Signed)
Will get her back into hand surgery for her contractures.

## 2013-02-14 NOTE — Addendum Note (Signed)
Addended by: Javed Cotto C on: 02/14/2013 12:06 PM   Modules accepted: Orders

## 2013-02-14 NOTE — Assessment & Plan Note (Addendum)
She is doing very well with respect to her HIV. She has mutliple other issues as well. She is given condoms. Will check her hepatitis panel at f/u. Will see her back in 6 months.

## 2013-02-14 NOTE — Assessment & Plan Note (Signed)
Will have her eval by GYN

## 2013-02-14 NOTE — Progress Notes (Signed)
   Subjective:    Patient ID: Laurie Gray, female    DOB: November 07, 1955, 58 y.o.   MRN: [id number]  HPI 58 yo F with hx of dx 2000 HIV+, depression, hyperlipidemia, prev lap hysterecomy-BSO (2009). Previously took D4T cocktail, went to undetectable. Took herself off for 5 years. CD4 went under 200 and she was restarted on atripla.  Has been taking HCTZ, benicar for her BP.  Has been exercising.  Still having joint pain and worried she is getting contracture of her L 5th finger. Has taken tramadol with minimal relief.  Worried she is loosing enamel on her teeth.  Has been having intercourse, using condoms. Having lower abd pain and vaginal bleeding.   HIV 1 RNA Quant (copies/mL)  Date Value  01/11/2013 <20   07/26/2012 <20   02/17/2012 <20      CD4 T Cell Abs (/uL)  Date Value  01/11/2013 400   07/26/2012 350*  02/17/2012 440     Review of Systems  Constitutional: Negative for fever, chills, appetite change and unexpected weight change.  Eyes: Positive for visual disturbance.  Gastrointestinal: Positive for abdominal pain. Negative for diarrhea and constipation.  Genitourinary: Positive for dyspareunia.  Skin: Negative for rash.  Psychiatric/Behavioral: The patient is nervous/anxious.        Objective:   Physical Exam  Constitutional: She appears well-developed and well-nourished.  HENT:  Mouth/Throat: No oropharyngeal exudate.  Eyes: EOM are normal. Pupils are equal, round, and reactive to light.  Neck: Neck supple.  Cardiovascular: Normal rate, regular rhythm and normal heart sounds.   Pulmonary/Chest: Effort normal and breath sounds normal.  Abdominal: Soft. Bowel sounds are normal. She exhibits no distension. There is no tenderness.  Lymphadenopathy:    She has no cervical adenopathy.  Psychiatric: Her mood appears anxious.          Assessment & Plan:

## 2013-02-15 LAB — URINALYSIS, ROUTINE W REFLEX MICROSCOPIC
Bilirubin Urine: NEGATIVE
Glucose, UA: NEGATIVE mg/dL
Hgb urine dipstick: NEGATIVE
Ketones, ur: NEGATIVE mg/dL
LEUKOCYTES UA: NEGATIVE
Nitrite: POSITIVE — AB
PROTEIN: NEGATIVE mg/dL
Specific Gravity, Urine: 1.018 (ref 1.005–1.030)
UROBILINOGEN UA: 0.2 mg/dL (ref 0.0–1.0)
pH: 6 (ref 5.0–8.0)

## 2013-02-15 LAB — HEPATITIS A ANTIBODY, TOTAL: Hep A Total Ab: REACTIVE — AB

## 2013-02-15 LAB — HEPATITIS B SURFACE ANTIGEN: HEP B S AG: NEGATIVE

## 2013-02-15 LAB — URINALYSIS, MICROSCOPIC ONLY: Casts: NONE SEEN

## 2013-02-15 LAB — HEPATITIS C ANTIBODY: HCV Ab: NEGATIVE

## 2013-02-15 LAB — HEPATITIS B SURFACE ANTIBODY,QUALITATIVE: HEP B S AB: NEGATIVE

## 2013-02-15 LAB — HEPATITIS B CORE ANTIBODY, TOTAL: HEP B C TOTAL AB: NONREACTIVE

## 2013-02-15 LAB — RPR

## 2013-02-22 ENCOUNTER — Telehealth: Payer: Self-pay | Admitting: Infectious Diseases

## 2013-02-22 DIAGNOSIS — N390 Urinary tract infection, site not specified: Secondary | ICD-10-CM

## 2013-02-22 LAB — URINE CULTURE: Colony Count: 10000

## 2013-02-22 MED ORDER — CIPROFLOXACIN HCL 500 MG PO TABS: 500.0000 mg | ORAL_TABLET | Freq: Two times a day (BID) | ORAL | Status: DC

## 2013-02-22 NOTE — Telephone Encounter (Signed)
Has UCx+ for E coli. Will send in rx for cipro for 5 days.

## 2013-02-24 ENCOUNTER — Encounter: Payer: Self-pay | Admitting: *Deleted

## 2013-02-24 NOTE — Addendum Note (Signed)
Addended by: Landis Gandy on: 02/24/2013 05:33 PM   Modules accepted: Orders

## 2013-03-01 NOTE — Telephone Encounter (Signed)
Error

## 2013-03-02 ENCOUNTER — Telehealth: Payer: Self-pay | Admitting: *Deleted

## 2013-03-02 NOTE — Addendum Note (Signed)
Addended by: Landis Gandy on: 03/02/2013 04:44 PM   Modules accepted: Orders

## 2013-03-02 NOTE — Telephone Encounter (Signed)
Neuro and gynecology sent via epic 2/19 [facility] scheduled for 3/9 [facility] declined gynecology, sent via epic to [facility] 2/25 Will fax referral to [facility] 2/25 [phone]) Given information to dental clinic 2/25. Landis Gandy, RN

## 2013-03-03 ENCOUNTER — Ambulatory Visit: Payer: Medicare Other | Admitting: Neurology

## 2013-03-14 ENCOUNTER — Ambulatory Visit: Payer: Medicare Other | Admitting: Neurology

## 2013-03-16 ENCOUNTER — Telehealth: Payer: Self-pay | Admitting: *Deleted

## 2013-03-16 ENCOUNTER — Encounter: Payer: Self-pay | Admitting: Neurology

## 2013-03-16 NOTE — Telephone Encounter (Addendum)
Left message notifying patient that she missed her Neurology referral appointment.  Asked her to please let me know if she still needs this. Spoke with [facility] - they have left her 2 messages to schedule an appointment, but she has not returned the call. [facility] has contacted the patient, but she needs to speak with someone in billing before she can be scheduled.  Patient has not returned calls. Landis Gandy, RN

## 2013-03-28 ENCOUNTER — Ambulatory Visit: Payer: Medicare Other | Admitting: Neurology

## 2013-03-30 ENCOUNTER — Ambulatory Visit: Payer: Medicare Other | Admitting: Neurology

## 2013-04-04 ENCOUNTER — Telehealth: Payer: Self-pay | Admitting: *Deleted

## 2013-04-04 NOTE — Telephone Encounter (Signed)
Unable to refer patient to [facility] - she has no-showed a 2nd appointment. See note below: _________________ Pt has no showed a 2nd appt with our office. We will not be able to r/s this pt with our office again. Sorry! Thanks! Sherri

## 2013-04-06 ENCOUNTER — Other Ambulatory Visit: Payer: Self-pay | Admitting: Infectious Diseases

## 2013-04-06 ENCOUNTER — Other Ambulatory Visit (HOSPITAL_COMMUNITY)
Admission: RE | Admit: 2013-04-06 | Discharge: 2013-04-06 | Disposition: A | Discharge status: Home / Self Care | Payer: Medicare HMO | Source: Ambulatory Visit | Attending: Gynecology | Admitting: Gynecology

## 2013-04-06 ENCOUNTER — Encounter: Payer: Self-pay | Admitting: Gynecology

## 2013-04-06 ENCOUNTER — Ambulatory Visit (INDEPENDENT_AMBULATORY_CARE_PROVIDER_SITE_OTHER): Payer: Commercial Managed Care - HMO | Admitting: Gynecology

## 2013-04-06 VITALS — BP 134/80 | Ht 64.75 in | Wt 116.0 lb

## 2013-04-06 DIAGNOSIS — M899 Disorder of bone, unspecified: Secondary | ICD-10-CM

## 2013-04-06 DIAGNOSIS — N951 Menopausal and female climacteric states: Secondary | ICD-10-CM

## 2013-04-06 DIAGNOSIS — Z780 Asymptomatic menopausal state: Secondary | ICD-10-CM

## 2013-04-06 DIAGNOSIS — Z124 Encounter for screening for malignant neoplasm of cervix: Secondary | ICD-10-CM | POA: Insufficient documentation

## 2013-04-06 DIAGNOSIS — Z01419 Encounter for gynecological examination (general) (routine) without abnormal findings: Secondary | ICD-10-CM

## 2013-04-06 DIAGNOSIS — M8580 Other specified disorders of bone density and structure, unspecified site: Secondary | ICD-10-CM

## 2013-04-06 DIAGNOSIS — N952 Postmenopausal atrophic vaginitis: Secondary | ICD-10-CM

## 2013-04-06 DIAGNOSIS — M949 Disorder of cartilage, unspecified: Secondary | ICD-10-CM

## 2013-04-06 DIAGNOSIS — R87810 Cervical high risk human papillomavirus (HPV) DNA test positive: Secondary | ICD-10-CM | POA: Insufficient documentation

## 2013-04-06 DIAGNOSIS — R634 Abnormal weight loss: Secondary | ICD-10-CM

## 2013-04-06 DIAGNOSIS — Z1151 Encounter for screening for human papillomavirus (HPV): Secondary | ICD-10-CM | POA: Insufficient documentation

## 2013-04-06 MED ORDER — OSPEMIFENE 60 MG PO TABS: 60.0000 mg | ORAL_TABLET | Freq: Every day | ORAL | Status: DC

## 2013-04-06 NOTE — Progress Notes (Signed)
Laurie Gray 1955-02-06 [id number]   History:    58 y.o.  for annual gyn exam is been seen in the office since 2009. Patient was diagnosed with HIV in 2000 and has been followed by Dr. Johnnye Sima infectious disease. Patient recently had normal CD4 counts. Please see medication list in records. She is also being treated for depression and hyperlipidemia. Patient and the year 2000 had a total abdominal hysterectomy for leiomyomata uteri. In 2009 she had laparoscopic bilateral salpingo-oophorectomy. She was on HRT for short time less than a year. She denies any vasomotor symptoms although she has begun to be sexually active over the past year has complained of dyspareunia. Patient's mother with history of breast cancer. Patient has spastic dystonia (genetic). Patient has complained of decreased weight although she exercises and he is healthy. All her lab work recently were normal but there was no TSH so we will check her TSH level today. She had a normal colonoscopy 6 years ago as well as a bone density study in 2011.   Past medical history,surgical history, family history and social history were all reviewed and documented in the EPIC chart.  Gynecologic History No LMP recorded. Patient has had a hysterectomy. Contraception: status post hysterectomy Last Pap:  2014. Results were: normal Last mammogram:  2014. Results were: normal  Obstetric History OB History  No data available     ROS: A ROS was performed and pertinent positives and negatives are included in the history.  GENERAL: No fevers or chills. HEENT: No change in vision, no earache, sore throat or sinus congestion. NECK: No pain or stiffness. CARDIOVASCULAR: No chest pain or pressure. No palpitations. PULMONARY: No shortness of breath, cough or wheeze. GASTROINTESTINAL: No abdominal pain, nausea, vomiting or diarrhea, melena or bright red blood per rectum. GENITOURINARY: No urinary frequency, urgency, hesitancy or dysuria.  MUSCULOSKELETAL: No joint or muscle pain, no back pain, no recent trauma. DERMATOLOGIC: No rash, no itching, no lesions. ENDOCRINE: No polyuria, polydipsia, no heat or cold intolerance. No recent change in weight. HEMATOLOGICAL: No anemia or easy bruising or bleeding. NEUROLOGIC: No headache, seizures, numbness, tingling or weakness. PSYCHIATRIC: No depression, no loss of interest in normal activity or change in sleep pattern.     Exam: chaperone present  BP 134/80  Ht 5' 4.75" (1.645 m)  Wt 116 lb (52.617 kg)  BMI 19.44 kg/m2  Body mass index is 19.44 kg/(m^2).  General appearance : Well developed well nourished female. No acute distress HEENT: Neck supple, trachea midline, no carotid bruits, no thyroidmegaly Lungs: Clear to auscultation, no rhonchi or wheezes, or rib retractions  Heart: Regular rate and rhythm, no murmurs or gallops Breast:Examined in sitting and supine position were symmetrical in appearance, no palpable masses or tenderness,  no skin retraction, no nipple inversion, no nipple discharge, no skin discoloration, no axillary or supraclavicular lymphadenopathy Abdomen: no palpable masses or tenderness, no rebound or guarding Extremities: no edema or skin discoloration or tenderness  Pelvic:  Bartholin, Urethra, Skene Glands: Within normal limits             Vagina: No gross lesions or discharge  Cervix:  Absent  Uterus  absent   Adnexa  Without masses or tenderness  Anus and perineum  normal   Rectovaginal  normal sphincter tone without palpated masses or tenderness             Hemoccult  PCP provides     Assessment/Plan:  58 y.o. female for annual exam  with history of HIV diagnosed in the year 2000. Recent CD4 counts were normal. All labs normal with the exception TSH not drawn which will be checked today. Patient's PCP is treating her for hyperlipidemia as well. Patient suffering from vaginal dryness contributing to dyspareunia. She is not interested in any vaginal  creams I have offered and prescribed Osphena 60 mg which she can take daily. The risks benefits and pros and cons of the medication were discussed and literature and information was provided. Pap smear was done today. We discussed the importance of calcium and vitamin D for osteoporosis prevention. When she had her mammogram this year upper abdomen and that she have also in the same facility her bone density study.  Note: This dictation was prepared with  Dragon/digital dictation along withSmart phrase technology. Any transcriptional errors that result from this process are unintentional.   Terrance Mass MD, 9:56 AM 04/06/2013

## 2013-04-06 NOTE — Patient Instructions (Signed)
Ospemifene oral tablets What is this medicine? OSPEMIFENE (os PEM i feen) is used to treat painful sexual intercourse in females, a symptom of changes in and around the vagina during menopause. This medicine may be used for other purposes; ask your health care provider or pharmacist if you have questions. COMMON BRAND NAME(S): Osphena What should I tell my health care provider before I take this medicine? They need to know if you have any of these conditions: -cancer, such as breast, uterine, or other cancer -heart disease -history of blood clots -history of stroke -history of vaginal bleeding -liver disease -premenopausal -smoke tobacco -an unusual or allergic reaction to ospemifene, other medicines, foods, dyes, or preservatives -pregnant or trying to get pregnant -breast-feeding How should I use this medicine? Take this medicine by mouth with a glass of water. Take this medicine with food. Follow the directions on the prescription label. Do not take your medicine more often than directed. Talk to your pediatrician regarding the use of this medicine in children. Special care may be needed. Overdosage: If you think you've taken too much of this medicine contact a poison control center or emergency room at once. Overdosage: If you think you have taken too much of this medicine contact a poison control center or emergency room at once. NOTE: This medicine is only for you. Do not share this medicine with others. What if I miss a dose? If you miss a dose, take it as soon as you can. If it is almost time for your next dose, take only that dose. Do not take double or extra doses. What may interact with this medicine? -doxycycline -estrogens -fluconazole -furosemide -glyburide -ketoconazole -phenytoin -rifampin -warfarin This list may not describe all possible interactions. Give your health care provider a list of all the medicines, herbs, non-prescription drugs, or dietary supplements  you use. Also tell them if you smoke, drink alcohol, or use illegal drugs. Some items may interact with your medicine. What should I watch for while using this medicine? Visit your health care professional for regular checks on your progress. You will need a regular breast and pelvic exam and Pap smear while on this medicine. You should also discuss the need for regular mammograms with your health care professional, and follow his or her guidelines for these tests. Also, periodically discuss the need to continue taking this medicine. Taking this medicine for long periods of time may increase your risk for serious side effects. This medicine can increase the risk of developing a condition (endometrial hyperplasia) that may lead to cancer of the lining of the uterus. Taking progestins, another hormone drug, with this medicine lowers the risk of developing this condition. Therefore, if your uterus has not been removed (by a hysterectomy), your doctor may prescribe a progestin for you to take together with your estrogen. You should know, however, that taking estrogens with progestins may have additional health risks. You should discuss the use of estrogens and progestins with your health care professional to determine the benefits and risks for you. This medicine can rarely cause blood clots. You should avoid long periods of bed rest while taking this medicine. If you are going to have surgery, tell your doctor or health care professional that you are taking this medicine. This medicine should be stopped at least 4-6 weeks before surgery. After surgery, it should be restarted only after you are walking again. It should not be restarted while you still need long periods of bed rest. You should not smoke  while taking this medicine. Smoking may also increase your risk of blood clots. Smoking can also decrease the effects of this medicine. This medicine does not prevent hot flashes. It may cause hot flashes in some  patients. If you have any reason to think you are pregnant; stop taking this medicine at once and contact your doctor or health care professional. What side effects may I notice from receiving this medicine? Side effects that you should report to your doctor or health care professional as soon as possible: -breathing problems -changes in vision -confusion, trouble speaking or understanding -new breast lumps -pain, swelling, warmth in the leg -pelvic pain or pressure -severe headaches -sudden chest pain -sudden numbness or weakness of the face, arm or leg -trouble walking, dizziness, loss of balance or coordination -unusual vaginal bleeding patterns -vaginal discharge that is bloody or brown  Side effects that usually do not require medical attention (Report these to your doctor or health care professional if they continue or are bothersome.): -hot flushes or flashes -increased sweating -muscle cramps -vaginal discharge (white or clear) This list may not describe all possible side effects. Call your doctor for medical advice about side effects. You may report side effects to FDA at 1-800-FDA-1088. Where should I keep my medicine? Keep out of the reach of children. Store at room temperature between 20 and 25 degrees C (68 and 77 degrees F). Protect from light. Keep container tightly closed. Throw away any unused medicine after the expiration date. NOTE: This sheet is a summary. It may not cover all possible information. If you have questions about this medicine, talk to your doctor, pharmacist, or health care provider.  2014, [employer]. (2011-03-06 10:29:05)

## 2013-04-07 ENCOUNTER — Encounter: Payer: Self-pay | Admitting: Gynecology

## 2013-04-07 ENCOUNTER — Other Ambulatory Visit: Payer: Self-pay | Admitting: Infectious Diseases

## 2013-04-07 LAB — TSH: TSH: 0.958 u[IU]/mL (ref 0.350–4.500)

## 2013-04-11 ENCOUNTER — Other Ambulatory Visit: Payer: Self-pay | Admitting: Infectious Diseases

## 2013-04-14 ENCOUNTER — Other Ambulatory Visit: Payer: Self-pay | Admitting: Infectious Diseases

## 2013-04-14 DIAGNOSIS — M1990 Unspecified osteoarthritis, unspecified site: Secondary | ICD-10-CM

## 2013-04-25 ENCOUNTER — Encounter: Payer: Self-pay | Admitting: Gynecology

## 2013-04-28 ENCOUNTER — Encounter: Payer: Self-pay | Admitting: Gynecology

## 2013-04-28 ENCOUNTER — Ambulatory Visit (INDEPENDENT_AMBULATORY_CARE_PROVIDER_SITE_OTHER): Payer: Commercial Managed Care - HMO | Admitting: Gynecology

## 2013-04-28 VITALS — BP 138/90

## 2013-04-28 DIAGNOSIS — Z87410 Personal history of cervical dysplasia: Secondary | ICD-10-CM | POA: Insufficient documentation

## 2013-04-28 DIAGNOSIS — N870 Mild cervical dysplasia: Secondary | ICD-10-CM

## 2013-04-28 DIAGNOSIS — N952 Postmenopausal atrophic vaginitis: Secondary | ICD-10-CM

## 2013-04-28 DIAGNOSIS — N893 Dysplasia of vagina, unspecified: Secondary | ICD-10-CM

## 2013-04-28 MED ORDER — NONFORMULARY OR COMPOUNDED ITEM: Status: DC

## 2013-04-28 NOTE — Patient Instructions (Signed)
Hormone Therapy At menopause, your body begins making less estrogen and progesterone hormones. This causes the body to stop having menstrual periods. This is because estrogen and progesterone hormones control your periods and menstrual cycle. A lack of estrogen may cause symptoms such as:  Hot flushes (or hot flashes).  Vaginal dryness.  Dry skin.  Loss of sex drive.  Risk of bone loss (osteoporosis). When this happens, you may choose to take hormone therapy to get back the estrogen lost during menopause. When the hormone estrogen is given alone, it is usually referred to as ET (Estrogen Therapy). When the hormone progestin is combined with estrogen, it is generally called HT (Hormone Therapy). This was formerly known as hormone replacement therapy (HRT). Your caregiver can help you make a decision on what will be best for you. The decision to use HT seems to change often as new studies are done. Many studies do not agree on the benefits of hormone replacement therapy. LIKELY BENEFITS OF HT INCLUDE PROTECTION FROM:  Hot Flushes (also called hot flashes) - A hot flush is a sudden feeling of heat that spreads over the face and body. The skin may redden like a blush. It is connected with sweats and sleep disturbance. Women going through menopause may have hot flushes a few times a month or several times per day depending on the woman.  Osteoporosis (bone loss)- Estrogen helps guard against bone loss. After menopause, a woman's bones slowly lose calcium and become weak and brittle. As a result, bones are more likely to break. The hip, wrist, and spine are affected most often. Hormone therapy can help slow bone loss after menopause. Weight bearing exercise and taking calcium with vitamin D also can help prevent bone loss. There are also medications that your caregiver can prescribe that can help prevent osteoporosis.  Vaginal Dryness - Loss of estrogen causes changes in the vagina. Its lining may  become thin and dry. These changes can cause pain and bleeding during sexual intercourse. Dryness can also lead to infections. This can cause burning and itching. (Vaginal estrogen treatment can help relieve pain, itching, and dryness.)  Urinary Tract Infections are more common after menopause because of lack of estrogen. Some women also develop urinary incontinence because of low estrogen levels in the vagina and bladder.  Possible other benefits of estrogen include a positive effect on mood and short-term memory in women. RISKS AND COMPLICATIONS  Using estrogen alone without progesterone causes the lining of the uterus to grow. This increases the risk of lining of the uterus (endometrial) cancer. Your caregiver should give another hormone called progestin if you have a uterus.  Women who take combined (estrogen and progestin) HT appear to have an increased risk of breast cancer. The risk appears to be small, but increases throughout the time that HT is taken.  Combined therapy also makes the breast tissue slightly denser which makes it harder to read mammograms (breast X-rays).  Combined, estrogen and progesterone therapy can be taken together every day, in which case there may be spotting of blood. HT therapy can be taken cyclically in which case you will have menstrual periods. Cyclically means HT is taken for a set amount of days, then not taken, then this process is repeated.  HT may increase the risk of stroke, heart attack, breast cancer and forming blood clots in your leg.  Transdermal estrogen (estrogen that is absorbed through the skin with a patch or a cream) may have more positive results with:    Cholesterol.  Blood pressure.  Blood clots. Having the following conditions may indicate you should not have HT:  Endometrial cancer.  Liver disease.  Breast cancer.  Heart disease.  History of blood clots.  Stroke. TREATMENT   If you choose to take HT and have a uterus,  usually estrogen and progestin are prescribed.  Your caregiver will help you decide the best way to take the medications.  Possible ways to take estrogen include:  Pills.  Patches.  Gels.  Sprays.  Vaginal estrogen cream, rings and tablets.  It is best to take the lowest dose possible that will help your symptoms and take them for the shortest period of time that you can.  Hormone therapy can help relieve some of the problems (symptoms) that affect women at menopause. Before making a decision about HT, talk to your caregiver about what is best for you. Be well informed and comfortable with your decisions. HOME CARE INSTRUCTIONS   Follow your caregivers advice when taking the medications.  A Pap test is done to screen for cervical cancer.  The first Pap test should be done at age 66.  Between ages 20 and 82, Pap tests are repeated every 2 years.  Beginning at age 20, you are advised to have a Pap test every 3 years as long as your past 3 Pap tests have been normal.  Some women have medical problems that increase the chance of getting cervical cancer. Talk to your caregiver about these problems. It is especially important to talk to your caregiver if a new problem develops soon after your last Pap test. In these cases, your caregiver may recommend more frequent screening and Pap tests.  The above recommendations are the same for women who have or have not gotten the vaccine for HPV (Human Papillomavirus).  If you had a hysterectomy for a problem that was not a cancer or a condition that could lead to cancer, then you no longer need Pap tests. However, even if you no longer need a Pap test, a regular exam is a good idea to make sure no other problems are starting.   If you are between ages 77 and 5, and you have had normal Pap tests going back 10 years, you no longer need Pap tests. However, even if you no longer need a Pap test, a regular exam is a good idea to make sure no  other problems are starting.   If you have had past treatment for cervical cancer or a condition that could lead to cancer, you need Pap tests and screening for cancer for at least 20 years after your treatment.  If Pap tests have been discontinued, risk factors (such as a new sexual partner) need to be re-assessed to determine if screening should be resumed.  Some women may need screenings more often if they are at high risk for cervical cancer.  Get mammograms done as per the advice of your caregiver. SEEK IMMEDIATE MEDICAL CARE IF:  You develop abnormal vaginal bleeding.  You have pain or swelling in your legs, shortness of breath, or chest pain.  You develop dizziness or headaches.  You have lumps or changes in your breasts or armpits.  You have slurred speech.  You develop weakness or numbness of your arms or legs.  You have pain, burning, or bleeding when urinating.  You develop abdominal pain. Document Released: 09/21/2002 Document Revised: 03/17/2011 Document Reviewed: 01/09/2010 [facility] Patient Information 2014 [place], [place]. Hepatitis A Hepatitis A is a  viral infection of the liver. There is no evidence that this form of hepatitis progresses to a longstanding (chronic) form. Hepatitis A does not progress to cancer of the liver as it may with chronic forms of hepatitis B and hepatitis C. CAUSES Hepatitis A is caused by the hepatitis A virus (HAV). The virus enters the body through the mouth by unclean (contaminated) food or water. Once in the body, viral infection begins in the bowel and spreads to the liver, causing illness.  SYMPTOMS Most cases of hepatitis A are mild and cause few problems. Many people may not even realize they are sick. Others may feel tired, have nausea and vomiting, lose their appetite, have a low-grade fever, or experience pain under the ribs on the right side of the belly.Later on, symptoms may include dark-colored urine, light-colored bowel  movements, yellowing of the eyes and skin (jaundice), or itchy skin. Hepatitis A can sometimes become severe and some symptoms may last for weeks.  DIAGNOSIS Your caregiver can do a blood test to see if you have the disease. TREATMENT Hepatitis A usually resolves on its own over several weeks. There is no specific treatment for the disease once the virus has caused infection. It is important for the infected person to get plenty of rest and not return to work or school until fever is gone, appetite returns, and skin and eyes are no longer yellow.It is also important to avoid drinking alcohol and taking any medicine not prescribed or approved by your caregiver. PREVENTION Giving human immunoglobulin following exposure will help prevent or lessen the severity of the illness. This is only effective if given within 2 weeks following exposure.  Hepatitis A vaccine is available and highly effective in preventing hepatitis A when given both before and after exposure to this infection.In the [place] this vaccine is currently recommended as a substitute for human immunoglobulin within 2 weeks following exposure to the virus as well as for the following people before exposure:  All children beginning at age 39 year.  Children ages 80 to 70 in states and communities with existing vaccination programs.  International travelers to all areas where hepatitis A remains a problem.  Men who have sex with men.  Injection drug users.  People with chronic liver disease due to any cause.  People receiving clotting factor concentrates.  People who work with the hepatitis A virus in research lab settings. HOME CARE INSTRUCTIONS After becoming infected with the HAV, there will be a 2 to 6 week incubation period before getting sick. There will be shedding of the virus in the stool within 10 to 14 days of the infection. Because of the passing of viral particles in the stool, good hygiene will help prevent the spread of  this disease.  Frequent hand washing following use of the bathroom and before handling food or water is encouraged.  This infection is contagious. Follow your caregiver's instructions in order to avoid spread of the infection.  People who live with you should receive the hepatitis A vaccine.  Do not take any medicines, including common nonprescription medicines, until you ask your caregiver if it is okay. SEEK IMMEDIATE MEDICAL CARE IF:   You are unable to eat or drink.  Nausea or vomiting starts or continues.  You feel confused.  Jaundice becomes more severe.  You become very sleepy or have trouble waking up. MAKE SURE YOU:   Understand these instructions.  Will watch your condition.  Will get help right away if you  are not doing well or get worse. Document Released: 12/21/1999 Document Revised: 03/17/2011 Document Reviewed: 04/23/2010 [facility] Patient Information 2014 [place], [place].

## 2013-04-28 NOTE — Progress Notes (Signed)
   Patient is a 58 year old with history of HIV diagnosed in the year 2000 who has been followed by Dr. Johnnye Sima and infectious disease department. 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 HPV subtype 16, 18 and 45 were not detected  Patient with prior history of total abdominal hysterectomy for leiomyomata uteri in 2009 in a few years later had laparoscopic bilateral salpingo-oophorectomy. Patient did complain also vaginal dryness and dyspareunia and we had initially prescribed Osphena but it was too costly.  Patient underwent a detail colposcopic evaluation today. External genitalia, perineum, and perirectal region were inspected and no lesions were noted. A speculum was introduced into the vagina. The entire vaginal tube was inspected before and after applying acetic acid and no lesions were noted.  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 Patient will be prescribed vaginal estrogen to use twice a week for vaginal atrophy and dyspareunia. Resident assistant pros and cons were discussed and literature and information was provided.

## 2013-05-09 ENCOUNTER — Encounter: Payer: Self-pay | Admitting: Gynecology

## 2013-05-09 ENCOUNTER — Encounter: Payer: Self-pay | Admitting: Infectious Diseases

## 2013-05-09 ENCOUNTER — Other Ambulatory Visit: Payer: Self-pay | Admitting: Infectious Diseases

## 2013-05-10 ENCOUNTER — Other Ambulatory Visit: Payer: Self-pay | Admitting: *Deleted

## 2013-05-10 DIAGNOSIS — M1990 Unspecified osteoarthritis, unspecified site: Secondary | ICD-10-CM

## 2013-05-10 MED ORDER — TRAMADOL HCL 50 MG PO TABS: ORAL_TABLET | ORAL | Status: DC

## 2013-06-03 ENCOUNTER — Other Ambulatory Visit: Payer: Self-pay | Admitting: Internal Medicine

## 2013-06-03 ENCOUNTER — Other Ambulatory Visit: Payer: Self-pay | Admitting: Infectious Diseases

## 2013-06-06 ENCOUNTER — Other Ambulatory Visit: Payer: Self-pay | Admitting: Infectious Diseases

## 2013-06-06 ENCOUNTER — Telehealth: Payer: Self-pay | Admitting: *Deleted

## 2013-06-06 DIAGNOSIS — M1990 Unspecified osteoarthritis, unspecified site: Secondary | ICD-10-CM

## 2013-06-06 DIAGNOSIS — E785 Hyperlipidemia, unspecified: Secondary | ICD-10-CM

## 2013-06-06 MED ORDER — PRAVASTATIN SODIUM 40 MG PO TABS: ORAL_TABLET | ORAL | Status: DC

## 2013-06-06 MED ORDER — TRAMADOL HCL 50 MG PO TABS: ORAL_TABLET | ORAL | Status: DC

## 2013-06-06 NOTE — Telephone Encounter (Signed)
Pt requesting Tramadol refill.  MD please advise.

## 2013-06-06 NOTE — Progress Notes (Signed)
Pt called to refill tramadol.

## 2013-06-06 NOTE — Telephone Encounter (Signed)
Refilled

## 2013-06-08 ENCOUNTER — Telehealth: Payer: Self-pay | Admitting: *Deleted

## 2013-06-08 NOTE — Telephone Encounter (Signed)
Scheduled patient with clinic doctor for 6/4 d/t possible dental abscess.  See pt's email below: Landis Gandy, RN   I have a really bad toothache in front tooth, can't sleep and I have no painkillers. I recently had a dental checkup, but no cavities... maybe I have an infection that needs to be treated. Call me please... my cell phone is [phone]. Thanks much!

## 2013-06-09 ENCOUNTER — Encounter: Payer: Self-pay | Admitting: *Deleted

## 2013-06-09 ENCOUNTER — Encounter: Payer: Self-pay | Admitting: Internal Medicine

## 2013-06-09 ENCOUNTER — Ambulatory Visit (INDEPENDENT_AMBULATORY_CARE_PROVIDER_SITE_OTHER): Payer: Commercial Managed Care - HMO | Admitting: Internal Medicine

## 2013-06-09 VITALS — Ht 65.0 in | Wt 119.0 lb

## 2013-06-09 DIAGNOSIS — Z23 Encounter for immunization: Secondary | ICD-10-CM

## 2013-06-09 DIAGNOSIS — K0889 Other specified disorders of teeth and supporting structures: Secondary | ICD-10-CM

## 2013-06-09 DIAGNOSIS — B20 Human immunodeficiency virus [HIV] disease: Secondary | ICD-10-CM

## 2013-06-09 DIAGNOSIS — K089 Disorder of teeth and supporting structures, unspecified: Secondary | ICD-10-CM

## 2013-06-09 LAB — CBC WITH DIFFERENTIAL/PLATELET
BASOS ABS: 0 10*3/uL (ref 0.0–0.1)
BASOS PCT: 1 % (ref 0–1)
Eosinophils Absolute: 0.1 10*3/uL (ref 0.0–0.7)
Eosinophils Relative: 2 % (ref 0–5)
HCT: 36.7 % (ref 36.0–46.0)
HEMOGLOBIN: 11.9 g/dL — AB (ref 12.0–15.0)
Lymphocytes Relative: 46 % (ref 12–46)
Lymphs Abs: 1.4 10*3/uL (ref 0.7–4.0)
MCH: 30 pg (ref 26.0–34.0)
MCHC: 32.4 g/dL (ref 30.0–36.0)
MCV: 92.4 fL (ref 78.0–100.0)
MONO ABS: 0.3 10*3/uL (ref 0.1–1.0)
Monocytes Relative: 9 % (ref 3–12)
NEUTROS ABS: 1.3 10*3/uL — AB (ref 1.7–7.7)
Neutrophils Relative %: 42 % — ABNORMAL LOW (ref 43–77)
Platelets: 218 10*3/uL (ref 150–400)
RBC: 3.97 MIL/uL (ref 3.87–5.11)
RDW: 14.5 % (ref 11.5–15.5)
WBC: 3 10*3/uL — ABNORMAL LOW (ref 4.0–10.5)

## 2013-06-09 LAB — COMPLETE METABOLIC PANEL WITH GFR
ALBUMIN: 4.1 g/dL (ref 3.5–5.2)
ALK PHOS: 82 U/L (ref 39–117)
ALT: 18 U/L (ref 0–35)
AST: 17 U/L (ref 0–37)
BUN: 16 mg/dL (ref 6–23)
CO2: 29 mEq/L (ref 19–32)
Calcium: 10.9 mg/dL — ABNORMAL HIGH (ref 8.4–10.5)
Chloride: 105 mEq/L (ref 96–112)
Creat: 0.76 mg/dL (ref 0.50–1.10)
GFR, EST NON AFRICAN AMERICAN: 87 mL/min
GFR, Est African American: >89 mL/min
GLUCOSE: 67 mg/dL — AB (ref 70–99)
POTASSIUM: 3.7 meq/L (ref 3.5–5.3)
SODIUM: 142 meq/L (ref 135–145)
TOTAL PROTEIN: 6.5 g/dL (ref 6.0–8.3)
Total Bilirubin: 0.3 mg/dL (ref 0.2–1.2)

## 2013-06-09 NOTE — Addendum Note (Signed)
Addended by: Landis Gandy on: 06/09/2013 04:43 PM   Modules accepted: Orders

## 2013-06-09 NOTE — Assessment & Plan Note (Signed)
Will check labs today and get in for follow up in 2 weeks with Dr. Johnnye Sima since she is here.

## 2013-06-09 NOTE — Telephone Encounter (Signed)
Error

## 2013-06-09 NOTE — Progress Notes (Signed)
   Subjective:    Patient ID: Laurie Gray, female    DOB: 1955-07-30, 58 y.o.   MRN: [id number]  HPI Here for a work in visit.  Recently had dental cleaning and now pain in teeth.  Front upper teeth.  Feels like she has an "abscess".  No drainage. + hot and cold sensitivity.  No fever.     Review of Systems  Constitutional: Negative for fever.  HENT: Positive for dental problem.        Objective:   Physical Exam  HENT:  No inflammation, no redness, no drainage.           Assessment & Plan:

## 2013-06-09 NOTE — Assessment & Plan Note (Signed)
No signs of infection.  Advised to try NSAIDS.

## 2013-06-10 LAB — HIV-1 RNA QUANT-NO REFLEX-BLD: HIV 1 RNA Quant: <20 copies/mL (ref <20)

## 2013-06-10 LAB — T-HELPER CELL (CD4) - (RCID CLINIC ONLY)
CD4 T CELL HELPER: 34 % (ref 33–55)
CD4 T Cell Abs: 430 /uL (ref 400–2700)

## 2013-06-27 ENCOUNTER — Ambulatory Visit: Payer: Commercial Managed Care - HMO | Admitting: Infectious Diseases

## 2013-07-08 ENCOUNTER — Other Ambulatory Visit: Payer: Self-pay | Admitting: Internal Medicine

## 2013-07-08 ENCOUNTER — Other Ambulatory Visit: Payer: Self-pay | Admitting: Infectious Diseases

## 2013-07-11 ENCOUNTER — Ambulatory Visit (INDEPENDENT_AMBULATORY_CARE_PROVIDER_SITE_OTHER): Payer: Commercial Managed Care - HMO | Admitting: *Deleted

## 2013-07-11 DIAGNOSIS — Z23 Encounter for immunization: Secondary | ICD-10-CM

## 2013-07-11 NOTE — Progress Notes (Signed)
Hep B #2 injection 

## 2013-07-13 ENCOUNTER — Other Ambulatory Visit: Payer: Self-pay | Admitting: *Deleted

## 2013-08-15 ENCOUNTER — Other Ambulatory Visit: Payer: Medicare Other

## 2013-08-25 ENCOUNTER — Ambulatory Visit: Payer: Commercial Managed Care - HMO | Admitting: Infectious Diseases

## 2013-09-05 ENCOUNTER — Ambulatory Visit: Payer: Medicare Other | Admitting: Infectious Diseases

## 2013-09-27 ENCOUNTER — Ambulatory Visit: Payer: Commercial Managed Care - HMO | Admitting: Internal Medicine

## 2013-10-01 ENCOUNTER — Other Ambulatory Visit: Payer: Self-pay | Admitting: Infectious Diseases

## 2013-10-05 ENCOUNTER — Ambulatory Visit: Payer: Commercial Managed Care - HMO | Admitting: Infectious Diseases

## 2013-10-21 ENCOUNTER — Other Ambulatory Visit: Payer: Self-pay

## 2013-11-02 ENCOUNTER — Other Ambulatory Visit: Payer: Self-pay | Admitting: Infectious Diseases

## 2013-11-02 ENCOUNTER — Ambulatory Visit (INDEPENDENT_AMBULATORY_CARE_PROVIDER_SITE_OTHER): Payer: Commercial Managed Care - HMO | Admitting: Internal Medicine

## 2013-11-02 ENCOUNTER — Encounter: Payer: Self-pay | Admitting: Internal Medicine

## 2013-11-02 VITALS — BP 143/80 | HR 67 | Temp 98.1°F | Ht 65.0 in | Wt 114.8 lb

## 2013-11-02 DIAGNOSIS — R519 Headache, unspecified: Secondary | ICD-10-CM

## 2013-11-02 DIAGNOSIS — R51 Headache: Secondary | ICD-10-CM

## 2013-11-02 DIAGNOSIS — E785 Hyperlipidemia, unspecified: Secondary | ICD-10-CM

## 2013-11-02 DIAGNOSIS — M899 Disorder of bone, unspecified: Secondary | ICD-10-CM

## 2013-11-02 DIAGNOSIS — B20 Human immunodeficiency virus [HIV] disease: Secondary | ICD-10-CM

## 2013-11-02 DIAGNOSIS — N952 Postmenopausal atrophic vaginitis: Secondary | ICD-10-CM

## 2013-11-02 DIAGNOSIS — F32A Depression, unspecified: Secondary | ICD-10-CM | POA: Insufficient documentation

## 2013-11-02 DIAGNOSIS — M949 Disorder of cartilage, unspecified: Secondary | ICD-10-CM

## 2013-11-02 DIAGNOSIS — G8929 Other chronic pain: Secondary | ICD-10-CM

## 2013-11-02 DIAGNOSIS — E010 Iodine-deficiency related diffuse (endemic) goiter: Secondary | ICD-10-CM

## 2013-11-02 DIAGNOSIS — Z23 Encounter for immunization: Secondary | ICD-10-CM

## 2013-11-02 DIAGNOSIS — I10 Essential (primary) hypertension: Secondary | ICD-10-CM

## 2013-11-02 DIAGNOSIS — Z1239 Encounter for other screening for malignant neoplasm of breast: Secondary | ICD-10-CM

## 2013-11-02 DIAGNOSIS — F329 Major depressive disorder, single episode, unspecified: Secondary | ICD-10-CM

## 2013-11-02 DIAGNOSIS — E049 Nontoxic goiter, unspecified: Secondary | ICD-10-CM

## 2013-11-02 NOTE — Progress Notes (Signed)
Patient ID: Laurie Gray, female   DOB: 17-Dec-1955, 58 y.o.   MRN: [id number]    Chief Complaint  Patient presents with  . New Patient    New patient ( Establish Care)  . Immunizations    Discuss having immunizations   Allergies  Allergen Reactions  . Sulfonamide Derivatives Swelling   HPI 58 y/o female patient is here to establish care. She has history of HIV diagnosed in 200 on Atriple at present, depression off all medications, HTN, hyperlipidemia, thyromegaly, arthritis and migraine among others. She follows with ID and has follow up with ID in november 2015 Compliant with her bp medications but has not taken one this am Takes flonase as needed for her allergic rhinitis Has hx of dysphonia and was getting botox injection from [facility] until 5 years back and then stopped it. She goes to [employer] and to [facility] 3 times a week and exercise for atleast 30 min a day. 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 Tramadol has been helping with the pain  She is sexually active, uses condoms for protection, is s/p hysterectomy with BSO and recent HSV treatment. She was given estradiol cream for atrophic vaginitis but is not using it at present  Wt Readings from Last 3 Encounters:  11/02/13 114 lb 12.8 oz (52.073 kg)  06/09/13 119 lb (53.978 kg)  04/06/13 116 lb (52.617 kg)   Review of Systems  Constitutional: Negative for fever, chills, malaise/fatigue and diaphoresis. has lost 5 lbs since June 2015. 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   Eyes: Negative for double vision and discharge. has some blurred vision in left eye, has hx of cataract in left eye. Wants to establish care with new eye doctor Respiratory: Negative for cough, sputum production, shortness of breath and wheezing. Negative for smoking history  Cardiovascular: Negative for chest pain, palpitations, orthopnea and  leg swelling.  Gastrointestinal: Negative for heartburn, nausea, vomiting, abdominal pain, melena, rectal bleed, diarrhea and constipation.  Genitourinary: Negative for dysuria, urgency, frequency and flank pain.  Musculoskeletal: Negative for back pain, falls. Has hx of arthritis Skin: Negative for itching and rash.  Neurological: Negative for dizziness, tingling, focal weakness. Has history of migraine and gets an attack once a month and tramadol and headache powder helps with the pain Psychiatric/Behavioral: Negative for depression and memory loss. Has some anxiety. Denies insomnia.    Past Medical History  Diagnosis Date  . HIV infection   . Hyperlipidemia   . Hypertension   . Pituitary adenoma    Past Surgical History  Procedure Laterality Date  . Abdominal hysterectomy    . Tonsillectomy    . Toe surgery    . Tubal ligation     Current Outpatient Prescriptions on File Prior to Visit  Medication Sig Dispense Refill  . Aspirin-Salicylamide-Caffeine (BC HEADACHE POWDER PO) Take 1 Package by mouth 2 (two) times daily as needed (for pain).      . ATRIPLA 600-200-300 MG per tablet TAKE 1 TABLET BY MOUTH AT BEDTIME.  90 tablet  2  . BENICAR HCT 20-12.5 MG per tablet TAKE 1 TABLET BY MOUTH DAILY.  30 tablet  3  . fluticasone (FLONASE) 50 MCG/ACT nasal spray Place 1 spray into both nostrils as needed for rhinitis or allergies.      . Multiple Vitamins-Minerals (MULTIVITAMIN WITH MINERALS) tablet Take 1 tablet by mouth  every morning.       . NONFORMULARY OR COMPOUNDED ITEM Estradiol .02% 1 ML Prefilled Applicator Sig: apply vaginally twice a week #90 Day Supply with 4 refills  1 each  4  . omega-3 acid ethyl esters (LOVAZA) 1 G capsule Take 1 g by mouth every evening.      . pravastatin (PRAVACHOL) 40 MG tablet TAKE 1 TABLET (40 MG TOTAL) BY MOUTH DAILY.  90 tablet  1  . traMADol (ULTRAM) 50 MG tablet TAKE 1 TABLET EVERY 12 HOURS  60 tablet  0   No current facility-administered  medications on file prior to visit.   History   Social History  . Marital Status: Widowed    Spouse Name: N/A    Number of Children: N/A  . Years of Education: N/A   Occupational History  . Not on file.   Social History Main Topics  . Smoking status: Never Smoker   . Smokeless tobacco: Never Used  . Alcohol Use: Yes     Comment: rarely  . Drug Use: No  . Sexual Activity: Yes    Partners: Male     Comment: pt. given condoms   Other Topics Concern  . Not on file   Social History Narrative   Normal diet   Drinks Caffeine- coffee/coke/chocolate    Lives in an apartment/ third level/1 person/1 pet   Past profession- Museum/gallery exhibitions officer, cosmetologist   Exercises 3-4 x weekly (cardio and weights)       Family History  Problem Relation Age of Onset  . Breast cancer Mother   . Hypertension Mother   . Cancer Mother   . Hyperlipidemia Mother   . Stroke Mother    Physical exam BP 143/80  Pulse 67  Temp(Src) 98.1 F (36.7 C) (Oral)  Ht 5\' 5"  (1.651 m)  Wt 114 lb 12.8 oz (52.073 kg)  BMI 19.10 kg/m2  SpO2 93%  General- adult female in no acute distress, thin built Head- atraumatic, normocephalic Eyes- PERRLA, EOMI, no pallor, no icterus, no discharge Neck- no lymphadenopathy, no thyromegaly, no carotid bruit Mouth- normal mucus membrane, no oral thrush, normal oropharynx Cardiovascular- normal s1,s2, no murmurs, normal radial pulses Respiratory- bilateral clear to auscultation, no wheeze, no rhonchi, no crackles Abdomen- bowel sounds present, soft, non tender, no guarding or rigidity, no CVA tenderness Musculoskeletal- able to move all 4 extremities, no spinal and paraspinal tenderness, steady gait, no use of assistive device, no leg edema, contracture in left 5th finger. Neurological- no focal deficit Skin- warm and dry Psychiatry- alert and oriented to person, place and time, normal mood and affect  Labs-  Lab Results  Component Value Date   WBC 3.0* 06/09/2013   HGB  11.9* 06/09/2013   HCT 36.7 06/09/2013   MCV 92.4 06/09/2013   PLT 218 06/09/2013   CMP     Component Value Date/Time   NA 142 06/09/2013 1012   K 3.7 06/09/2013 1012   CL 105 06/09/2013 1012   CO2 29 06/09/2013 1012   GLUCOSE 67* 06/09/2013 1012   BUN 16 06/09/2013 1012   CREATININE 0.76 06/09/2013 1012   CREATININE 0.77 07/07/2010 1655   CALCIUM 10.9* 06/09/2013 1012   PROT 6.5 06/09/2013 1012   ALBUMIN 4.1 06/09/2013 1012   AST 17 06/09/2013 1012   ALT 18 06/09/2013 1012   ALKPHOS 82 06/09/2013 1012   BILITOT 0.3 06/09/2013 1012   GFRNONAA 87 06/09/2013 1012   GFRNONAA >60 07/07/2010 1655   GFRAA >89 06/09/2013  1012   GFRAA >60 07/07/2010 1655   Lipid Panel     Component Value Date/Time   CHOL 226* 01/11/2013 1030   TRIG 81 01/11/2013 1030   HDL 72 01/11/2013 1030   CHOLHDL 3.1 01/11/2013 1030   VLDL 16 01/11/2013 1030   LDLCALC 138* 01/11/2013 1030   Lab Results  Component Value Date   TSH 0.958 04/06/2013    Assessment/plan  1. Essential hypertension, benign Stable reading in office today, continue benicar-HCTZ 20-12.5 mg daily. Check renal function - CMP - CBC with Differential  2. Thyromegaly Check thyroid panel, off all meds at present - TSH  3. Disorder of bone and cartilage Will get dexa scan with her hx of osteopenia. Continue MVI.  - DG DXA FRACTURE ASSESSMENT; Future - MS DIGITAL SCREENING BILATERAL; Future  4. Vaginal atrophy Not using her estradiol cream at present. Mentions symptom being controlled. monitor  5. Chronic nonintractable headache, unspecified headache type Her BC headache powder and tramadol helping at present, monitor  6. Human immunodeficiency virus (HIV) disease Continue atriple, basic lab work today, f/u with ID. Will need yearly eye exam, refer to ophtho - Ambulatory referral to Ophthalmology  7. Hyperlipidemia Continue pravachol and check lipid panel. Continue omega 3 - Lipid Panel - CBC with Differential  8. Depression Off all meds, coping well, monitor  9. Breast  cancer screening - MS DIGITAL SCREENING BILATERAL; Future

## 2013-11-03 ENCOUNTER — Other Ambulatory Visit: Payer: Self-pay | Admitting: Infectious Diseases

## 2013-11-03 LAB — CBC WITH DIFFERENTIAL/PLATELET
BASOS ABS: 0 10*3/uL (ref 0.0–0.2)
Basos: 1 %
Eos: 2 %
Eosinophils Absolute: 0.1 10*3/uL (ref 0.0–0.4)
HCT: 34 % (ref 34.0–46.6)
HEMOGLOBIN: 11.2 g/dL (ref 11.1–15.9)
IMMATURE GRANS (ABS): 0 10*3/uL (ref 0.0–0.1)
IMMATURE GRANULOCYTES: 0 %
Lymphocytes Absolute: 1.3 10*3/uL (ref 0.7–3.1)
Lymphs: 37 %
MCH: 30.3 pg (ref 26.6–33.0)
MCHC: 32.9 g/dL (ref 31.5–35.7)
MCV: 92 fL (ref 79–97)
Monocytes Absolute: 0.2 10*3/uL (ref 0.1–0.9)
Monocytes: 6 %
Neutrophils Absolute: 1.8 10*3/uL (ref 1.4–7.0)
Neutrophils Relative %: 54 %
RBC: 3.7 x10E6/uL — ABNORMAL LOW (ref 3.77–5.28)
RDW: 13.7 % (ref 12.3–15.4)
WBC: 3.4 10*3/uL (ref 3.4–10.8)

## 2013-11-03 LAB — COMPREHENSIVE METABOLIC PANEL
A/G RATIO: 1.7 (ref 1.1–2.5)
ALK PHOS: 101 IU/L (ref 39–117)
ALT: 32 IU/L (ref 0–32)
AST: 30 IU/L (ref 0–40)
Albumin: 4.1 g/dL (ref 3.5–5.5)
BUN / CREAT RATIO: 14 (ref 9–23)
BUN: 14 mg/dL (ref 6–24)
CO2: 25 mmol/L (ref 18–29)
CREATININE: 1.03 mg/dL — AB (ref 0.57–1.00)
Calcium: 10.9 mg/dL — ABNORMAL HIGH (ref 8.7–10.2)
Chloride: 102 mmol/L (ref 97–108)
GFR calc Af Amer: 69 mL/min/{1.73_m2} (ref >59)
GFR, EST NON AFRICAN AMERICAN: 60 mL/min/{1.73_m2} (ref >59)
GLOBULIN, TOTAL: 2.4 g/dL (ref 1.5–4.5)
Glucose: 71 mg/dL (ref 65–99)
Potassium: 3.7 mmol/L (ref 3.5–5.2)
Sodium: 143 mmol/L (ref 134–144)
Total Bilirubin: <0.2 mg/dL (ref 0.0–1.2)
Total Protein: 6.5 g/dL (ref 6.0–8.5)

## 2013-11-03 LAB — LIPID PANEL
Chol/HDL Ratio: 2.7 ratio units (ref 0.0–4.4)
Cholesterol, Total: 232 mg/dL — ABNORMAL HIGH (ref 100–199)
HDL: 86 mg/dL (ref >39)
LDL CALC: 129 mg/dL — AB (ref 0–99)
TRIGLYCERIDES: 84 mg/dL (ref 0–149)
VLDL Cholesterol Cal: 17 mg/dL (ref 5–40)

## 2013-11-03 LAB — TSH: TSH: 1.51 u[IU]/mL (ref 0.450–4.500)

## 2013-11-04 ENCOUNTER — Other Ambulatory Visit: Payer: Self-pay | Admitting: Infectious Diseases

## 2013-11-04 ENCOUNTER — Telehealth: Payer: Self-pay | Admitting: *Deleted

## 2013-11-04 DIAGNOSIS — M899 Disorder of bone, unspecified: Secondary | ICD-10-CM

## 2013-11-04 DIAGNOSIS — M949 Disorder of cartilage, unspecified: Principal | ICD-10-CM

## 2013-11-04 NOTE — Telephone Encounter (Signed)
Message copied by Eilene Ghazi on Fri Nov 04, 2013  3:50 PM ------      Message from: Blanchie Serve      Created: Fri Nov 04, 2013  3:31 PM       Impaired kidney function likely from longstanding HTN. Impaired cholesterol level. Continue pravastatin for now. Will monitor your kidney function. Do not take NSAIDs. Encourage hydration ------

## 2013-11-04 NOTE — Telephone Encounter (Signed)
Left message for patient to call the office on Monday, for lab results.

## 2013-11-08 ENCOUNTER — Other Ambulatory Visit: Payer: Self-pay | Admitting: Infectious Diseases

## 2013-11-09 ENCOUNTER — Ambulatory Visit: Payer: Commercial Managed Care - HMO | Admitting: Infectious Diseases

## 2013-11-10 ENCOUNTER — Encounter: Payer: Self-pay | Admitting: Infectious Diseases

## 2013-11-10 ENCOUNTER — Encounter: Payer: Self-pay | Admitting: Internal Medicine

## 2013-11-17 ENCOUNTER — Ambulatory Visit: Payer: Commercial Managed Care - HMO

## 2013-11-30 ENCOUNTER — Other Ambulatory Visit: Payer: Self-pay | Admitting: Infectious Diseases

## 2013-12-05 ENCOUNTER — Ambulatory Visit (INDEPENDENT_AMBULATORY_CARE_PROVIDER_SITE_OTHER): Payer: Commercial Managed Care - HMO | Admitting: Infectious Diseases

## 2013-12-05 ENCOUNTER — Other Ambulatory Visit: Payer: Self-pay | Admitting: *Deleted

## 2013-12-05 ENCOUNTER — Encounter: Payer: Self-pay | Admitting: Infectious Diseases

## 2013-12-05 VITALS — BP 122/77 | HR 80 | Temp 98.1°F | Wt 118.0 lb

## 2013-12-05 DIAGNOSIS — B20 Human immunodeficiency virus [HIV] disease: Secondary | ICD-10-CM

## 2013-12-05 DIAGNOSIS — M899 Disorder of bone, unspecified: Secondary | ICD-10-CM

## 2013-12-05 DIAGNOSIS — N952 Postmenopausal atrophic vaginitis: Secondary | ICD-10-CM

## 2013-12-05 DIAGNOSIS — M949 Disorder of cartilage, unspecified: Principal | ICD-10-CM

## 2013-12-05 DIAGNOSIS — R519 Headache, unspecified: Secondary | ICD-10-CM

## 2013-12-05 DIAGNOSIS — R51 Headache: Secondary | ICD-10-CM

## 2013-12-05 DIAGNOSIS — Z23 Encounter for immunization: Secondary | ICD-10-CM

## 2013-12-05 LAB — CBC
HEMATOCRIT: 33.3 % — AB (ref 36.0–46.0)
Hemoglobin: 11.1 g/dL — ABNORMAL LOW (ref 12.0–15.0)
MCH: 30.7 pg (ref 26.0–34.0)
MCHC: 33.3 g/dL (ref 30.0–36.0)
MCV: 92.2 fL (ref 78.0–100.0)
MPV: 10.5 fL (ref 9.4–12.4)
PLATELETS: 230 10*3/uL (ref 150–400)
RBC: 3.61 MIL/uL — ABNORMAL LOW (ref 3.87–5.11)
RDW: 14.1 % (ref 11.5–15.5)
WBC: 5.7 10*3/uL (ref 4.0–10.5)

## 2013-12-05 LAB — COMPREHENSIVE METABOLIC PANEL
ALT: 20 U/L (ref 0–35)
AST: 21 U/L (ref 0–37)
Albumin: 4.1 g/dL (ref 3.5–5.2)
Alkaline Phosphatase: 93 U/L (ref 39–117)
BILIRUBIN TOTAL: 0.3 mg/dL (ref 0.2–1.2)
BUN: 12 mg/dL (ref 6–23)
CALCIUM: 10.8 mg/dL — AB (ref 8.4–10.5)
CHLORIDE: 106 meq/L (ref 96–112)
CO2: 28 mEq/L (ref 19–32)
CREATININE: 0.86 mg/dL (ref 0.50–1.10)
GLUCOSE: 87 mg/dL (ref 70–99)
Potassium: 3.6 mEq/L (ref 3.5–5.3)
Sodium: 143 mEq/L (ref 135–145)
Total Protein: 6.5 g/dL (ref 6.0–8.3)

## 2013-12-05 MED ORDER — TRAMADOL HCL 50 MG PO TABS: 50.0000 mg | ORAL_TABLET | Freq: Two times a day (BID) | ORAL | Status: DC

## 2013-12-05 NOTE — Addendum Note (Signed)
Addended by: Myrtis Hopping A on: 12/05/2013 04:19 PM   Modules accepted: Orders

## 2013-12-05 NOTE — Assessment & Plan Note (Signed)
She has not been using topical estrogen, she is afraid of cancer.

## 2013-12-05 NOTE — Assessment & Plan Note (Signed)
Will refill her tramadol Will check her for ANA, sarcoid.

## 2013-12-05 NOTE — Assessment & Plan Note (Signed)
Appears to be doing well. Will recheck her labs. She has gotten labs. Needs 3rd Hep B. Will see her back in 6 months. Offered/refused condoms.

## 2013-12-05 NOTE — Progress Notes (Signed)
   Subjective:    Patient ID: [name], female    DOB: [birth year], 58 y.o.   MRN: [id number]  HPI 58 yo F with hx of dx 2000 HIV+, depression, hyperlipidemia, prev lap hysterecomy-BSO (2009). Previously took D4T cocktail, went to undetectable. Took herself off for 5 years. CD4 went under 200 and was restarted on atripla.  Has been taking HCTZ, benicar for her BP.  Has had f/u with GYN this spring, SIL on colpo bx. Also started on topical estrogen.  Has been feeling poorly due to a "cold". Had fever and chills, also has had joint pains (espceically at night). Has run out of tramadol.  Has lost 10#. Has chronic dry eyes. Has been exercising.  Has PCP now.   HIV 1 RNA QUANT (copies/mL)  Date Value  06/09/2013 <20  01/11/2013 <20  07/26/2012 <20   CD4 T CELL ABS  Date Value  06/09/2013 430 /uL  01/11/2013 400 /uL  07/26/2012 350 cmm*     Review of Systems     Objective:   Physical Exam  Constitutional: She appears well-developed and well-nourished.  HENT:  Mouth/Throat: No oropharyngeal exudate.  Eyes: EOM are normal. Pupils are equal, round, and reactive to light.  Neck: Neck supple.  Cardiovascular: Normal rate, regular rhythm and normal heart sounds.   Pulmonary/Chest: Effort normal and breath sounds normal.  Abdominal: Soft. Bowel sounds are normal. She exhibits no distension. There is no tenderness.  Musculoskeletal:  Hammer contracture R 5th finger. Thickened skin L 5th finger.   Lymphadenopathy:    She has no cervical adenopathy.          Assessment & Plan:

## 2013-12-06 LAB — HIV-1 RNA QUANT-NO REFLEX-BLD
HIV 1 RNA Quant: <20 copies/mL (ref <20)
HIV-1 RNA Quant, Log: <1.3 {Log} (ref <1.30)

## 2013-12-06 LAB — ANA: Anti Nuclear Antibody(ANA): NEGATIVE

## 2013-12-06 LAB — ANGIOTENSIN CONVERTING ENZYME: ANGIOTENSIN-CONVERTING ENZYME: 12 U/L (ref 8–52)

## 2013-12-06 LAB — ANCA SCREEN W REFLEX TITER
Atypical p-ANCA Screen: NEGATIVE
c-ANCA Screen: NEGATIVE
p-ANCA Screen: NEGATIVE

## 2013-12-07 LAB — T-HELPER CELL (CD4) - (RCID CLINIC ONLY)
CD4 T CELL ABS: 390 /uL — AB (ref 400–2700)
CD4 T CELL HELPER: 37 % (ref 33–55)

## 2013-12-12 ENCOUNTER — Ambulatory Visit: Payer: Commercial Managed Care - HMO

## 2013-12-28 ENCOUNTER — Ambulatory Visit: Payer: Commercial Managed Care - HMO | Admitting: Infectious Diseases

## 2013-12-31 ENCOUNTER — Other Ambulatory Visit: Payer: Self-pay | Admitting: Infectious Diseases

## 2014-01-02 ENCOUNTER — Other Ambulatory Visit: Payer: Self-pay | Admitting: Infectious Diseases

## 2014-01-02 DIAGNOSIS — G44011 Episodic cluster headache, intractable: Secondary | ICD-10-CM

## 2014-01-02 DIAGNOSIS — E785 Hyperlipidemia, unspecified: Secondary | ICD-10-CM

## 2014-01-03 ENCOUNTER — Other Ambulatory Visit: Payer: Self-pay | Admitting: Infectious Diseases

## 2014-02-08 ENCOUNTER — Encounter: Payer: Commercial Managed Care - HMO | Admitting: Internal Medicine

## 2014-02-26 ENCOUNTER — Other Ambulatory Visit: Payer: Self-pay | Admitting: Infectious Diseases

## 2014-03-16 ENCOUNTER — Other Ambulatory Visit: Payer: Self-pay | Admitting: *Deleted

## 2014-03-16 DIAGNOSIS — Z113 Encounter for screening for infections with a predominantly sexual mode of transmission: Secondary | ICD-10-CM

## 2014-03-25 ENCOUNTER — Other Ambulatory Visit: Payer: Self-pay | Admitting: Infectious Diseases

## 2014-03-28 ENCOUNTER — Ambulatory Visit (INDEPENDENT_AMBULATORY_CARE_PROVIDER_SITE_OTHER): Payer: Commercial Managed Care - HMO | Admitting: Internal Medicine

## 2014-03-28 ENCOUNTER — Encounter: Payer: Self-pay | Admitting: Internal Medicine

## 2014-03-28 VITALS — BP 110/68 | HR 74 | Temp 98.9°F | Resp 18 | Ht 65.0 in | Wt 114.2 lb

## 2014-03-28 DIAGNOSIS — M899 Disorder of bone, unspecified: Secondary | ICD-10-CM | POA: Diagnosis not present

## 2014-03-28 DIAGNOSIS — M949 Disorder of cartilage, unspecified: Secondary | ICD-10-CM

## 2014-03-28 DIAGNOSIS — Z1239 Encounter for other screening for malignant neoplasm of breast: Secondary | ICD-10-CM

## 2014-03-28 DIAGNOSIS — Z0000 Encounter for general adult medical examination without abnormal findings: Secondary | ICD-10-CM

## 2014-03-28 DIAGNOSIS — B20 Human immunodeficiency virus [HIV] disease: Secondary | ICD-10-CM

## 2014-03-28 DIAGNOSIS — I10 Essential (primary) hypertension: Secondary | ICD-10-CM

## 2014-03-28 DIAGNOSIS — E785 Hyperlipidemia, unspecified: Secondary | ICD-10-CM | POA: Diagnosis not present

## 2014-03-28 DIAGNOSIS — Z23 Encounter for immunization: Secondary | ICD-10-CM

## 2014-03-28 NOTE — Progress Notes (Signed)
Patient ID: Laurie Gray, female   DOB: 26-Jan-1955, 59 y.o.   MRN: [id number]      Allergies  Allergen Reactions  . Sulfonamide Derivatives Swelling    Chief Complaint  Patient presents with  . Annual Exam    Yearly check up     HPI:  59 year old patient is here for her annual exam.  She has history of HIV, HTN, HLD She is followed by ID and Gyn. Her GYn appointment is next week. No POA paperwork Never had dexa scan before Due for prevnar 13 Due for Mammogram colonoscopy 01/07/11- 2 polyps removed Weight has been stable Walking for exercise Tries to eat healthy Sleeping good  Immunization History  Administered Date(s) Administered  . Hepatitis B, adult/adol-2 dose 06/09/2013, 07/11/2013, 12/05/2013  . Influenza Split 09/08/2010, 11/10/2011  . Influenza Whole 12/25/2008, 09/24/2009  . Influenza,inj,Quad PF,36+ Mos 10/11/2012, 11/02/2013  . Pneumococcal Polysaccharide-23 01/24/2010   Negative for falls   Review of Systems:  Constitutional: Negative for fever, chills, weight loss, malaise/fatigue and diaphoresis.  HENT: Negative for headache, congestion, nasal discharge, hearing loss, earache, sore throat, difficulty swallowing.   Eyes: Negative for eye pain, blurred vision, double vision and discharge. due for eye exam Respiratory: Negative for cough, shortness of breath and wheezing.   Cardiovascular: Negative for chest pain, palpitations, leg swelling.  Gastrointestinal: Negative for heartburn, nausea, vomiting, abdominal pain, melena, rectal bleed, diarrhea and constipation. appetite is normal Genitourinary: Negative for dysuria, urgency, frequency, hematuria, incontinence, nocturia and flank pain.  Musculoskeletal: Negative for back pain, falls. Has OA and myalgia and tramadol has been helpful. no assistive device used Skin: Negative for itching, sores and rash.  Neurological: Negative for weakness,dizziness, tingling, focal weakness Psychiatric/Behavioral:  Negative for depression, anxiety, insomnia and memory loss.    Past Medical History  Diagnosis Date  . HIV infection   . Hyperlipidemia   . Hypertension   . Pituitary adenoma    Past Surgical History  Procedure Laterality Date  . Abdominal hysterectomy    . Tonsillectomy    . Toe surgery    . Tubal ligation     Social History:   reports that she has never smoked. She has never used smokeless tobacco. She reports that she drinks alcohol. She reports that she does not use illicit drugs.  Family History  Problem Relation Age of Onset  . Breast cancer Mother   . Hypertension Mother   . Cancer Mother   . Hyperlipidemia Mother   . Stroke Mother     Medications: Patient's Medications  New Prescriptions   No medications on file  Previous Medications   ATRIPLA 600-200-300 MG PER TABLET    TAKE 1 TABLET BY MOUTH AT BEDTIME.   BENICAR HCT 20-12.5 MG PER TABLET    TAKE 1 TABLET BY MOUTH DAILY.   FLUTICASONE (FLONASE) 50 MCG/ACT NASAL SPRAY    Place 1 spray into both nostrils as needed for rhinitis or allergies.   MULTIPLE VITAMINS-MINERALS (MULTIVITAMIN WITH MINERALS) TABLET    Take 1 tablet by mouth every morning.    OMEGA-3 ACID ETHYL ESTERS (LOVAZA) 1 G CAPSULE    TAKE 1 CAPSULE EVERY DAY   PRAVASTATIN (PRAVACHOL) 40 MG TABLET    TAKE 1 TABLET (40 MG TOTAL) BY MOUTH DAILY.   TRAMADOL (ULTRAM) 50 MG TABLET    TAKE 1 TABLET BY MOUTH TWICE A DAY  Modified Medications   No medications on file  Discontinued Medications   NONFORMULARY OR COMPOUNDED ITEM  Estradiol .02% 1 ML Prefilled Applicator Sig: apply vaginally twice a week #90 Day Supply with 4 refills     Physical Exam: Filed Vitals:   03/28/14 0847  BP: 110/68  Pulse: 74  Temp: 98.9 F (37.2 C)  TempSrc: Oral  Resp: 18  Height: 5\' 5"  (1.651 m)  Weight: 114 lb 3.2 oz (51.801 kg)  SpO2: 97%   Wt Readings from Last 3 Encounters:  03/28/14 114 lb 3.2 oz (51.801 kg)  12/05/13 118 lb (53.524 kg)  11/02/13 114 lb  12.8 oz (52.073 kg)   General- adult female, thin built, in no acute distress Head- normocephalic, atraumatic Ears- left ear normal tympanic membrane and normal external ear canal , right ear normal tympanic membrane and normal external ear canal Nose- normal nasal mucosa, no maxillary or frontal sinus tenderness, no nasal discharge Throat- moist mucus membrane, normal oropharynx, dentition is  Eyes- PERRLA, EOMI, no pallor, no icterus, no discharge, normal conjunctiva, normal sclera Neck- no cervical lymphadenopathy, no supraclavicular lymphadenopathy, no thyromegaly, no jugular vein distension, no carotid bruit Chest- no chest wall deformities, no chest wall tenderness Breast- deferred to Gyn per pt request Cardiovascular- normal s1,s2, no murmurs/ rubs/ gallops, dorsalis pedis and radial pulses, leg edema Respiratory- bilateral clear to auscultation, no wheeze, no rhonchi, no crackles, no use of accessory muscles Abdomen- bowel sounds present, soft, non tender, no organomegaly, no abdominal bruits, no guarding or rigidity, no CVA tenderness Pelvic exam- deferred to Gyn Musculoskeletal- able to move all 4 extremities, no spinal and paraspinal tenderness, normal back curvature, steady gait, no use of assistive device, normal range of motion  Neurological- no focal deficit, normal reflexes, normal muscle strength, normal sensation to fine touch and vibration Skin- warm and dry Psychiatry- alert and oriented to person, place and time, normal mood and affect    Labs reviewed: Basic Metabolic Panel:  Recent Labs  06/09/13 1012 11/02/13 1134 12/05/13 1636  NA 142 143 143  K 3.7 3.7 3.6  CL 105 102 106  CO2 29 25 28   GLUCOSE 67* 71 87  BUN 16 14 12   CREATININE 0.76 1.03* 0.86  CALCIUM 10.9* 10.9* 10.8*   Liver Function Tests:  Recent Labs  06/09/13 1012 11/02/13 1134 12/05/13 1636  AST 17 30 21   ALT 18 32 20  ALKPHOS 82 101 93  BILITOT 0.3 <0.2 0.3  PROT 6.5 6.5 6.5    ALBUMIN 4.1  --  4.1   No results for input(s): LIPASE, AMYLASE in the last 8760 hours. No results for input(s): AMMONIA in the last 8760 hours. CBC:  Recent Labs  06/09/13 1012 11/02/13 1134 12/05/13 1636  WBC 3.0* 3.4 5.7  NEUTROABS 1.3* 1.8  --   HGB 11.9* 11.2 11.1*  HCT 36.7 34.0 33.3*  MCV 92.4 92 92.2  PLT 218  --  230   Lab Results  Component Value Date   TSH 1.510 11/02/2013   Lipid Panel     Component Value Date/Time   CHOL 232* 11/02/2013 1134   CHOL 226* 01/11/2013 1030   TRIG 84 11/02/2013 1134   HDL 86 11/02/2013 1134   HDL 72 01/11/2013 1030   CHOLHDL 2.7 11/02/2013 1134   CHOLHDL 3.1 01/11/2013 1030   VLDL 16 01/11/2013 1030   LDLCALC 129* 11/02/2013 1134   LDLCALC 138* 01/11/2013 1030    EKG: Independently reviewed. NSR, 79 bpm, QTc 455, PR 178  Assessment/Plan  1. Essential hypertension, benign Stable bp reading. Continue benicar HCTZ 20-12.5 mg  daily.  - EKG 12-Lead - CMP - CBC with Differential - TSH - Hemoglobin A1c  2. Disorder of bone and cartilage Check vitamin d level. Order dexa scan to assess for osteopenia/ osteoporosis. Has hx of OA and is on tramadol bid - Vitamin D, 1,25-dihydroxy  3. Human immunodeficiency virus (HIV) disease Continue atripla, f/u with ID - CBC with Differential - TSH - Hemoglobin A1c  4. Hyperlipidemia Reviewed lipid from 1/16. Continue pravastatin 40 mg daily and lovaza - Lipid Panel - CBC with Differential - TSH - Hemoglobin A1c  5. Routine exam the patient was counseled regarding the appropriate use of alcohol, regular self-examination of the breasts on a monthly basis, prevention of dental and periodontal disease, diet, regular sustained exercise for at least 30 minutes 5 times per week, routine screening interval for mammogram as recommended by the [place] and ACOG, the proper use of sunscreen and protective clothing, tobacco use, and recommended schedule for GI hemoccult  testing, colonoscopy, cholesterol, thyroid and diabetes screening. prevnar 13 provided today  Blanchie Serve, MD  [facility] [phone] (Monday-Friday 8 am - 5 pm) [phone] (afterhours)

## 2014-03-28 NOTE — Addendum Note (Signed)
Addended by: Eilene Ghazi on: 03/28/2014 09:50 AM   Modules accepted: Orders

## 2014-03-28 NOTE — Addendum Note (Signed)
Addended byBlanchie Serve on: 03/28/2014 09:39 AM   Modules accepted: Orders

## 2014-04-02 LAB — COMPREHENSIVE METABOLIC PANEL
A/G RATIO: 1.7 (ref 1.1–2.5)
ALT: 20 IU/L (ref 0–32)
AST: 22 IU/L (ref 0–40)
Albumin: 4.2 g/dL (ref 3.5–5.5)
Alkaline Phosphatase: 100 IU/L (ref 39–117)
BUN/Creatinine Ratio: 9 (ref 9–23)
BUN: 7 mg/dL (ref 6–24)
Bilirubin Total: <0.2 mg/dL (ref 0.0–1.2)
CALCIUM: 11 mg/dL — AB (ref 8.7–10.2)
CO2: 25 mmol/L (ref 18–29)
Chloride: 102 mmol/L (ref 97–108)
Creatinine, Ser: 0.82 mg/dL (ref 0.57–1.00)
GFR calc Af Amer: 91 mL/min/{1.73_m2} (ref >59)
GFR calc non Af Amer: 79 mL/min/{1.73_m2} (ref >59)
GLUCOSE: 97 mg/dL (ref 65–99)
Globulin, Total: 2.5 g/dL (ref 1.5–4.5)
POTASSIUM: 3.9 mmol/L (ref 3.5–5.2)
Sodium: 144 mmol/L (ref 134–144)
Total Protein: 6.7 g/dL (ref 6.0–8.5)

## 2014-04-02 LAB — CBC WITH DIFFERENTIAL/PLATELET
BASOS: 0 %
Basophils Absolute: 0 10*3/uL (ref 0.0–0.2)
EOS ABS: 0.1 10*3/uL (ref 0.0–0.4)
Eos: 1 %
HCT: 34.4 % (ref 34.0–46.6)
Hemoglobin: 11.3 g/dL (ref 11.1–15.9)
Immature Grans (Abs): 0 10*3/uL (ref 0.0–0.1)
Immature Granulocytes: 0 %
LYMPHS: 11 %
Lymphocytes Absolute: 0.8 10*3/uL (ref 0.7–3.1)
MCH: 30.9 pg (ref 26.6–33.0)
MCHC: 32.8 g/dL (ref 31.5–35.7)
MCV: 94 fL (ref 79–97)
Monocytes Absolute: 0.4 10*3/uL (ref 0.1–0.9)
Monocytes: 6 %
Neutrophils Absolute: 5.7 10*3/uL (ref 1.4–7.0)
Neutrophils Relative %: 82 %
PLATELETS: 307 10*3/uL (ref 150–379)
RBC: 3.66 x10E6/uL — ABNORMAL LOW (ref 3.77–5.28)
RDW: 13.3 % (ref 12.3–15.4)
WBC: 7 10*3/uL (ref 3.4–10.8)

## 2014-04-02 LAB — VITAMIN D 1,25 DIHYDROXY
VITAMIN D 1, 25 (OH) TOTAL: 76 pg/mL
Vitamin D2 1, 25 (OH)2: <10 pg/mL
Vitamin D3 1, 25 (OH)2: 76 pg/mL

## 2014-04-02 LAB — HEMOGLOBIN A1C
Est. average glucose Bld gHb Est-mCnc: 117 mg/dL
Hgb A1c MFr Bld: 5.7 % — ABNORMAL HIGH (ref 4.8–5.6)

## 2014-04-02 LAB — TSH: TSH: 1.25 u[IU]/mL (ref 0.450–4.500)

## 2014-04-05 ENCOUNTER — Other Ambulatory Visit: Payer: Self-pay | Admitting: Internal Medicine

## 2014-04-05 ENCOUNTER — Ambulatory Visit (HOSPITAL_COMMUNITY)
Admission: RE | Admit: 2014-04-05 | Discharge: 2014-04-05 | Disposition: A | Discharge status: Home / Self Care | Payer: Commercial Managed Care - HMO | Source: Ambulatory Visit | Attending: Internal Medicine | Admitting: Internal Medicine

## 2014-04-05 DIAGNOSIS — M949 Disorder of cartilage, unspecified: Principal | ICD-10-CM

## 2014-04-05 DIAGNOSIS — Z780 Asymptomatic menopausal state: Secondary | ICD-10-CM | POA: Insufficient documentation

## 2014-04-05 DIAGNOSIS — Z13820 Encounter for screening for osteoporosis: Secondary | ICD-10-CM | POA: Insufficient documentation

## 2014-04-05 DIAGNOSIS — Z1239 Encounter for other screening for malignant neoplasm of breast: Secondary | ICD-10-CM

## 2014-04-05 DIAGNOSIS — M8589 Other specified disorders of bone density and structure, multiple sites: Secondary | ICD-10-CM | POA: Diagnosis not present

## 2014-04-05 DIAGNOSIS — Z1231 Encounter for screening mammogram for malignant neoplasm of breast: Secondary | ICD-10-CM | POA: Diagnosis not present

## 2014-04-05 DIAGNOSIS — M899 Disorder of bone, unspecified: Secondary | ICD-10-CM

## 2014-04-13 ENCOUNTER — Other Ambulatory Visit: Payer: Self-pay

## 2014-04-13 MED ORDER — ALENDRONATE SODIUM 70 MG PO TABS: 70.0000 mg | ORAL_TABLET | ORAL | Status: DC

## 2014-04-19 ENCOUNTER — Other Ambulatory Visit (HOSPITAL_COMMUNITY)
Admission: RE | Admit: 2014-04-19 | Discharge: 2014-04-19 | Disposition: A | Discharge status: Home / Self Care | Payer: Commercial Managed Care - HMO | Source: Ambulatory Visit | Attending: Gynecology | Admitting: Gynecology

## 2014-04-19 ENCOUNTER — Ambulatory Visit (INDEPENDENT_AMBULATORY_CARE_PROVIDER_SITE_OTHER): Payer: Commercial Managed Care - HMO | Admitting: Gynecology

## 2014-04-19 ENCOUNTER — Encounter: Payer: Self-pay | Admitting: Gynecology

## 2014-04-19 VITALS — BP 138/90 | Ht 64.5 in | Wt 109.0 lb

## 2014-04-19 DIAGNOSIS — Z01419 Encounter for gynecological examination (general) (routine) without abnormal findings: Secondary | ICD-10-CM | POA: Diagnosis not present

## 2014-04-19 DIAGNOSIS — Z8619 Personal history of other infectious and parasitic diseases: Secondary | ICD-10-CM | POA: Diagnosis not present

## 2014-04-19 DIAGNOSIS — M8580 Other specified disorders of bone density and structure, unspecified site: Secondary | ICD-10-CM | POA: Diagnosis not present

## 2014-04-19 DIAGNOSIS — Z01411 Encounter for gynecological examination (general) (routine) with abnormal findings: Secondary | ICD-10-CM | POA: Diagnosis not present

## 2014-04-19 DIAGNOSIS — Z113 Encounter for screening for infections with a predominantly sexual mode of transmission: Secondary | ICD-10-CM | POA: Diagnosis not present

## 2014-04-19 DIAGNOSIS — B20 Human immunodeficiency virus [HIV] disease: Secondary | ICD-10-CM

## 2014-04-19 MED ORDER — CYCLOBENZAPRINE HCL 5 MG PO TABS: ORAL_TABLET | ORAL | Status: DC

## 2014-04-19 NOTE — Progress Notes (Signed)
Laurie Gray August 15, 1955 [id number]   History:    59 y.o.  for annual gyn exam with complaints of on and off at times of right lower quadrant discomfort during intercourse. Patient also would like to have a GC and Chlamydia culture done today.Patient was diagnosed with HIV in 2000 and has been followed by Dr. Johnnye Sima infectious disease. Patient recently had normal CD4 counts. Please see medication list in records. She is also being treated for depression and hyperlipidemia. Patient and the year 2000 had a total abdominal hysterectomy for leiomyomata uteri. In 2009 she had laparoscopic bilateral salpingo-oophorectomy. She was on HRT for short time less than a year. She denies any vasomotor symptoms.Patient has spastic dystonia (genetic). Patient has complained of decreased weight although she exercises regularly.  Bone density study this year demonstrated her lowest T score was -2.3 at the [facility] spine. She reports having had a colonoscopy back in 2009 and benign polyps were detected and she states that she is on a 10 year recall. Her PCP has been doing her blood work.   Past medical history,surgical history, family history and social history were all reviewed and documented in the EPIC chart.  Gynecologic History No LMP recorded. Patient has had a hysterectomy. Contraception: status post hysterectomy Last Pap: 2015. Results were: normal Last mammogram: 2016. Results were: Normal but dense  Obstetric History OB History  Gravida Para Term Preterm AB SAB TAB Ectopic Multiple Living  2 2        2     # Outcome Date GA Lbr Len/2nd Weight Sex Delivery Anes PTL Lv  2 Para           1 Para                ROS: A ROS was performed and pertinent positives and negatives are included in the history.  GENERAL: No fevers or chills. HEENT: No change in vision, no earache, sore throat or sinus congestion. NECK: No pain or stiffness. CARDIOVASCULAR: No chest pain or pressure. No palpitations. PULMONARY:  No shortness of breath, cough or wheeze. GASTROINTESTINAL: No abdominal pain, nausea, vomiting or diarrhea, melena or bright red blood per rectum. GENITOURINARY: No urinary frequency, urgency, hesitancy or dysuria. MUSCULOSKELETAL: No joint or muscle pain, no back pain, no recent trauma. DERMATOLOGIC: No rash, no itching, no lesions. ENDOCRINE: No polyuria, polydipsia, no heat or cold intolerance. No recent change in weight. HEMATOLOGICAL: No anemia or easy bruising or bleeding. NEUROLOGIC: No headache, seizures, numbness, tingling or weakness. PSYCHIATRIC: No depression, no loss of interest in normal activity or change in sleep pattern.     Exam: chaperone present  BP 138/90 mmHg  Ht 5' 4.5" (1.638 m)  Wt 109 lb (49.442 kg)  BMI 18.43 kg/m2  Body mass index is 18.43 kg/(m^2).  General appearance : Well developed well nourished female. No acute distress HEENT: Eyes: no retinal hemorrhage or exudates,  Neck supple, trachea midline, no carotid bruits, no thyroidmegaly Lungs: Clear to auscultation, no rhonchi or wheezes, or rib retractions  Heart: Regular rate and rhythm, no murmurs or gallops Breast:Examined in sitting and supine position were symmetrical in appearance, no palpable masses or tenderness,  no skin retraction, no nipple inversion, no nipple discharge, no skin discoloration, no axillary or supraclavicular lymphadenopathy Abdomen: no palpable masses or tenderness, no rebound or guarding Extremities: no edema or skin discoloration or tenderness  Pelvic:  Bartholin, Urethra, Skene Glands: Within normal limits  Vagina: No gross lesions or discharge  Cervix: No gross lesions or discharge  Uterus absent  Adnexa  absent nontender  Anus and perineum  normal   Rectovaginal  normal sphincter tone without palpated masses or tenderness             Hemoccult PCP provides     Assessment/Plan:  59 y.o. female for annual exam with history of HIV diagnosed in the year 2000.  Recent CD4 counts were normal. Pap smear done today. PCP doing blood work. GC and Chlamydia culture obtained today. Patient was informed of her recent blood with done by her PCP whereby her calcium level was elevated. She should contact them to address this issue. I would recommend she stop taking oral calcium in the meantime.   Terrance Mass MD, 9:06 AM 04/19/2014

## 2014-04-20 LAB — CYTOLOGY - PAP

## 2014-04-20 LAB — GC/CHLAMYDIA PROBE AMP
CT PROBE, AMP APTIMA: NEGATIVE
GC PROBE AMP APTIMA: NEGATIVE

## 2014-04-23 ENCOUNTER — Other Ambulatory Visit: Payer: Self-pay | Admitting: Infectious Diseases

## 2014-05-02 ENCOUNTER — Other Ambulatory Visit: Payer: Self-pay | Admitting: Gynecology

## 2014-05-02 MED ORDER — FLUCONAZOLE 150 MG PO TABS: 150.0000 mg | ORAL_TABLET | Freq: Once | ORAL | Status: DC

## 2014-05-17 ENCOUNTER — Other Ambulatory Visit: Payer: Commercial Managed Care - HMO

## 2014-05-24 ENCOUNTER — Other Ambulatory Visit: Payer: Medicare HMO

## 2014-05-25 ENCOUNTER — Other Ambulatory Visit: Payer: Commercial Managed Care - HMO

## 2014-05-25 DIAGNOSIS — B20 Human immunodeficiency virus [HIV] disease: Secondary | ICD-10-CM | POA: Diagnosis not present

## 2014-05-26 ENCOUNTER — Encounter: Payer: Self-pay | Admitting: Internal Medicine

## 2014-05-26 ENCOUNTER — Ambulatory Visit (INDEPENDENT_AMBULATORY_CARE_PROVIDER_SITE_OTHER): Payer: Commercial Managed Care - HMO | Admitting: Internal Medicine

## 2014-05-26 VITALS — BP 112/70 | HR 72 | Temp 98.6°F | Resp 18 | Ht 65.0 in | Wt 112.6 lb

## 2014-05-26 DIAGNOSIS — H547 Unspecified visual loss: Secondary | ICD-10-CM

## 2014-05-26 DIAGNOSIS — R7309 Other abnormal glucose: Secondary | ICD-10-CM | POA: Diagnosis not present

## 2014-05-26 DIAGNOSIS — M79674 Pain in right toe(s): Secondary | ICD-10-CM

## 2014-05-26 DIAGNOSIS — R7303 Prediabetes: Secondary | ICD-10-CM

## 2014-05-26 LAB — CBC WITH DIFFERENTIAL/PLATELET
Basophils Absolute: 0 10*3/uL (ref 0.0–0.1)
Basophils Relative: 1 % (ref 0–1)
Eosinophils Absolute: 0.1 10*3/uL (ref 0.0–0.7)
Eosinophils Relative: 3 % (ref 0–5)
HEMATOCRIT: 34.9 % — AB (ref 36.0–46.0)
HEMOGLOBIN: 11.4 g/dL — AB (ref 12.0–15.0)
LYMPHS PCT: 35 % (ref 12–46)
Lymphs Abs: 1.3 10*3/uL (ref 0.7–4.0)
MCH: 30.4 pg (ref 26.0–34.0)
MCHC: 32.7 g/dL (ref 30.0–36.0)
MCV: 93.1 fL (ref 78.0–100.0)
MONO ABS: 0.2 10*3/uL (ref 0.1–1.0)
MONOS PCT: 6 % (ref 3–12)
MPV: 11.2 fL (ref 8.6–12.4)
Neutro Abs: 2.1 10*3/uL (ref 1.7–7.7)
Neutrophils Relative %: 55 % (ref 43–77)
Platelets: 194 10*3/uL (ref 150–400)
RBC: 3.75 MIL/uL — ABNORMAL LOW (ref 3.87–5.11)
RDW: 14.1 % (ref 11.5–15.5)
WBC: 3.8 10*3/uL — ABNORMAL LOW (ref 4.0–10.5)

## 2014-05-26 LAB — T-HELPER CELL (CD4) - (RCID CLINIC ONLY)
CD4 T CELL ABS: 580 /uL (ref 400–2700)
CD4 T CELL HELPER: 41 % (ref 33–55)

## 2014-05-26 NOTE — Progress Notes (Signed)
Patient ID: Laurie Gray, female   DOB: 08/29/1955, 59 y.o.   MRN: [id number]    Location:    [facility]   Place of Service:   OFFICE    Chief Complaint  Patient presents with  . Acute Visit     Needs [employer] referral for podatrist and eye doctor    HPI:  59 yo female seen today for right foot pain. She c/o right planter 5th toe pain x unknown duration that has progressively worsened. She has pain with ambulation. Needs referral to podiatrist.  She also requests ophthamology referral to eval OS. She was told she has "broken blood vessels" when she was last examined 2 yrs ago in [place], [place]. At the time, she did not f/u as she was concerned about getting needles stuck into her eye. Noticed that vision is worsening and eye is beginning to hurt. She feels like FB in eye. Vision is cloudy.   Past Medical History  Diagnosis Date  . HIV infection   . Hyperlipidemia   . Hypertension   . Pituitary adenoma     Past Surgical History  Procedure Laterality Date  . Abdominal hysterectomy    . Tonsillectomy    . Toe surgery    . Tubal ligation      Patient Care Team: Lauree Chandler, NP as PCP - General (Nurse Practitioner) Campbell Riches, MD as PCP - Infectious Diseases (Infectious Diseases)  History   Social History  . Marital Status: Widowed    Spouse Name: N/A  . Number of Children: N/A  . Years of Education: N/A   Occupational History  . Not on file.   Social History Main Topics  . Smoking status: Never Smoker   . Smokeless tobacco: Never Used  . Alcohol Use: 0.0 oz/week    0 Standard drinks or equivalent per week     Comment: rarely  . Drug Use: No  . Sexual Activity:    Partners: Male     Comment: pt. given condoms. 1st intercourse- 16, partners- greater than 5    Other Topics Concern  . Not on file   Social History Narrative   Normal diet   Drinks Caffeine- coffee/coke/chocolate    Lives in an apartment/ third level/1 person/1 pet   Past profession-  Museum/gallery exhibitions officer, cosmetologist   Exercises 3-4 x weekly (cardio and weights)         reports that she has never smoked. She has never used smokeless tobacco. She reports that she drinks alcohol. She reports that she does not use illicit drugs.  Allergies  Allergen Reactions  . Sulfonamide Derivatives Swelling    Medications: Patient's Medications  New Prescriptions   No medications on file  Previous Medications   ALENDRONATE (FOSAMAX) 70 MG TABLET    Take 1 tablet (70 mg total) by mouth once a week. Take with a full glass of water on an empty stomach.   ATRIPLA 600-200-300 MG PER TABLET    TAKE 1 TABLET BY MOUTH AT BEDTIME.   BENICAR HCT 20-12.5 MG PER TABLET    TAKE 1 TABLET BY MOUTH DAILY.   CYCLOBENZAPRINE (FLEXERIL) 5 MG TABLET    Take one po PRN   FLUCONAZOLE (DIFLUCAN) 150 MG TABLET    Take 1 tablet (150 mg total) by mouth once.   FLUTICASONE (FLONASE) 50 MCG/ACT NASAL SPRAY    Place 1 spray into both nostrils as needed for rhinitis or allergies.   MULTIPLE VITAMINS-MINERALS (MULTIVITAMIN WITH MINERALS) TABLET  Take 1 tablet by mouth every morning.    OMEGA-3 ACID ETHYL ESTERS (LOVAZA) 1 G CAPSULE    TAKE 1 CAPSULE EVERY DAY   PRAVASTATIN (PRAVACHOL) 40 MG TABLET    TAKE 1 TABLET (40 MG TOTAL) BY MOUTH DAILY.   TRAMADOL (ULTRAM) 50 MG TABLET    TAKE 1 TABLET BY MOUTH TWICE A DAY  Modified Medications   No medications on file  Discontinued Medications   No medications on file    Review of Systems  Eyes: Positive for visual disturbance.  Musculoskeletal: Positive for joint swelling, arthralgias and gait problem.  Skin: Negative for rash.  Neurological: Negative for weakness and numbness.    Filed Vitals:   05/26/14 1158  BP: 112/70  Pulse: 72  Temp: 98.6 F (37 C)  TempSrc: Oral  Resp: 18  Height: _0  (1.651 m)  Weight: 112 lb 9.6 oz (51.075 kg)  SpO2: 98%   Body mass index is 18.74 kg/(m^2).  Physical Exam  Constitutional: She is oriented to person, place,  and time. She appears well-developed and well-nourished. No distress.  Eyes: Conjunctivae, EOM and lids are normal. Pupils are equal, round, and reactive to light. Right eye exhibits no discharge. Left eye exhibits no discharge. No scleral icterus.  No foreign body seen  Cardiovascular: Intact distal pulses and normal pulses.  Exam reveals no decreased pulses.   No LE edema. No calf TTP  Musculoskeletal: She exhibits edema and tenderness.       Right ankle: Achilles tendon normal.       Feet:  Neurological: She is alert and oriented to person, place, and time.  Skin: Skin is warm, dry and intact.  Psychiatric: She has a normal mood and affect. Her speech is normal and behavior is normal. Thought content normal.     Labs reviewed: Lab on 05/25/2014  Component Date Value Ref Range Status  . WBC 05/25/2014 3.8* 4.0 - 10.5 K/uL Final  . RBC 05/25/2014 3.75* 3.87 - 5.11 MIL/uL Final  . Hemoglobin 05/25/2014 11.4* 12.0 - 15.0 g/dL Final  . HCT 05/25/2014 34.9* 36.0 - 46.0 % Final  . MCV 05/25/2014 93.1  78.0 - 100.0 fL Final  . MCH 05/25/2014 30.4  26.0 - 34.0 pg Final  . MCHC 05/25/2014 32.7  30.0 - 36.0 g/dL Final  . RDW 05/25/2014 14.1  11.5 - 15.5 % Final  . Platelets 05/25/2014 194  150 - 400 K/uL Final  . MPV 05/25/2014 11.2  8.6 - 12.4 fL Final  . Neutrophils Relative % 05/25/2014 55  43 - 77 % Final  . Neutro Abs 05/25/2014 2.1  1.7 - 7.7 K/uL Final  . Lymphocytes Relative 05/25/2014 35  12 - 46 % Final  . Lymphs Abs 05/25/2014 1.3  0.7 - 4.0 K/uL Final  . Monocytes Relative 05/25/2014 6  3 - 12 % Final  . Monocytes Absolute 05/25/2014 0.2  0.1 - 1.0 K/uL Final  . Eosinophils Relative 05/25/2014 3  0 - 5 % Final  . Eosinophils Absolute 05/25/2014 0.1  0.0 - 0.7 K/uL Final  . Basophils Relative 05/25/2014 1  0 - 1 % Final  . Basophils Absolute 05/25/2014 0.0  0.0 - 0.1 K/uL Final  . Smear Review 05/25/2014 Criteria for review not met   Final  Office Visit on 04/19/2014    Component Date Value Ref Range Status  . CYTOLOGY - PAP 04/19/2014 PAP RESULT   Final  . CT Probe RNA 04/19/2014 NEGATIVE   Final  .  GC Probe RNA 04/19/2014 NEGATIVE   Final   Comment:                                                                                         **Normal Reference Range: Negative**         Assay performed using the Gen-Probe APTIMA COMBO2 (R) Assay.   Acceptable specimen types for this assay include APTIMA Swabs (Unisex, endocervical, urethral, or vaginal), first void urine, and ThinPrep liquid based cytology samples.   Office Visit on 03/28/2014  Component Date Value Ref Range Status  . Glucose 03/28/2014 97  65 - 99 mg/dL Final  . BUN 03/28/2014 7  6 - 24 mg/dL Final  . Creatinine, Ser 03/28/2014 0.82  0.57 - 1.00 mg/dL Final  . GFR calc non Af Amer 03/28/2014 79  >59 mL/min/1.73 Final  . GFR calc Af Amer 03/28/2014 91  >59 mL/min/1.73 Final  . BUN/Creatinine Ratio 03/28/2014 9  9 - 23 Final  . Sodium 03/28/2014 144  134 - 144 mmol/L Final  . Potassium 03/28/2014 3.9  3.5 - 5.2 mmol/L Final  . Chloride 03/28/2014 102  97 - 108 mmol/L Final  . CO2 03/28/2014 25  18 - 29 mmol/L Final  . Calcium 03/28/2014 11.0* 8.7 - 10.2 mg/dL Final  . Total Protein 03/28/2014 6.7  6.0 - 8.5 g/dL Final  . Albumin 03/28/2014 4.2  3.5 - 5.5 g/dL Final  . Globulin, Total 03/28/2014 2.5  1.5 - 4.5 g/dL Final  . Albumin/Globulin Ratio 03/28/2014 1.7  1.1 - 2.5 Final  . Bilirubin Total 03/28/2014 <0.2  0.0 - 1.2 mg/dL Final  . Alkaline Phosphatase 03/28/2014 100  39 - 117 IU/L Final  . AST 03/28/2014 22  0 - 40 IU/L Final  . ALT 03/28/2014 20  0 - 32 IU/L Final  . WBC 03/28/2014 7.0  3.4 - 10.8 x10E3/uL Final  . RBC 03/28/2014 3.66* 3.77 - 5.28 x10E6/uL Final  . Hemoglobin 03/28/2014 11.3  11.1 - 15.9 g/dL Final  . HCT 03/28/2014 34.4  34.0 - 46.6 % Final  . MCV 03/28/2014 94  79 - 97 fL Final  . MCH 03/28/2014 30.9  26.6 - 33.0 pg Final  . MCHC 03/28/2014 32.8  31.5 -  35.7 g/dL Final  . RDW 03/28/2014 13.3  12.3 - 15.4 % Final  . Platelets 03/28/2014 307  150 - 379 x10E3/uL Final  . Neutrophils Relative % 03/28/2014 82   Final  . Lymphs 03/28/2014 11   Final  . Monocytes 03/28/2014 6   Final  . Eos 03/28/2014 1   Final  . Basos 03/28/2014 0   Final  . Neutrophils Absolute 03/28/2014 5.7  1.4 - 7.0 x10E3/uL Final  . Lymphocytes Absolute 03/28/2014 0.8  0.7 - 3.1 x10E3/uL Final  . Monocytes Absolute 03/28/2014 0.4  0.1 - 0.9 x10E3/uL Final  . Eosinophils Absolute 03/28/2014 0.1  0.0 - 0.4 x10E3/uL Final  . Basophils Absolute 03/28/2014 0.0  0.0 - 0.2 x10E3/uL Final  . Immature Granulocytes 03/28/2014 0   Final  . Immature Grans (Abs) 03/28/2014 0.0  0.0 - 0.1 x10E3/uL Final  . TSH  03/28/2014 1.250  0.450 - 4.500 uIU/mL Final  . Hgb A1c MFr Bld 03/28/2014 5.7* 4.8 - 5.6 % Final   Comment:          Pre-diabetes: 5.7 - 6.4          Diabetes: >6.4          Glycemic control for adults with diabetes: <7.0   . Est. average glucose Bld gHb Est-m* 03/28/2014 117   Final  . Vitamin D 1, 25 (OH)2 Total 03/28/2014 76   Final   Comment: Reference Range: Adults: 21 - 65   . Vitamin D2 1, 25 (OH)2 03/28/2014 <10   Final  . Vitamin D3 1, 25 (OH)2 03/28/2014 76   Final    No results found.   Assessment/Plan   ICD-9-CM ICD-10-CM   1. Reduced vision 369.9 H54.7 Ambulatory referral to Ophthalmology  2. Pain of toe of right foot 729.5 M79.674 Ambulatory referral to Podiatry  3. Prediabetes 790.29 R73.09    --Continue current medications as ordered  --Keep appt with Dr Johnnye Sima as scheduled  --Follow up with Janett Billow as scheduled in July 2016 as scheduled  [place]. Perlie Gold  [facility] [address] [place],  [id number] [phone] Cell (Monday-Friday 8 AM - 5 PM) [phone] After 5 PM and follow prompts

## 2014-05-26 NOTE — Patient Instructions (Signed)
Continue current medications as ordered  Keep appt with Dr Johnnye Sima as scheduled  Follow up with Janett Billow as scheduled in July 2016 as scheduled

## 2014-05-28 LAB — HIV-1 RNA QUANT-NO REFLEX-BLD: HIV-1 RNA Quant, Log: <1.3 {Log} (ref <1.30)

## 2014-05-31 ENCOUNTER — Ambulatory Visit: Payer: Commercial Managed Care - HMO | Admitting: Infectious Diseases

## 2014-06-07 ENCOUNTER — Ambulatory Visit: Payer: Commercial Managed Care - HMO | Admitting: Infectious Diseases

## 2014-06-19 ENCOUNTER — Other Ambulatory Visit: Payer: Self-pay | Admitting: Infectious Diseases

## 2014-06-21 ENCOUNTER — Encounter: Payer: Self-pay | Admitting: *Deleted

## 2014-06-21 ENCOUNTER — Other Ambulatory Visit: Payer: Self-pay | Admitting: Infectious Diseases

## 2014-06-21 DIAGNOSIS — I10 Essential (primary) hypertension: Secondary | ICD-10-CM

## 2014-06-21 NOTE — Progress Notes (Signed)
Patient ID: Laurie Gray, female   DOB: 1955/02/22, 59 y.o.   MRN: [id number] Walk-in to Clinic, request refill for Tramadol.  Pt's refill was denied through the pharmacy.  The pt was asked to make an appointment with Dr. Johnnye Sima to discuss continued refills for this medication.  The pt was made an appt for July 1 with Dr. Johnnye Sima.  MD please advise about refill prior to that visit date.  The pt does have a PCP, Lauree Chandler, NP.

## 2014-06-22 ENCOUNTER — Other Ambulatory Visit: Payer: Self-pay | Admitting: *Deleted

## 2014-06-22 DIAGNOSIS — G44011 Episodic cluster headache, intractable: Secondary | ICD-10-CM

## 2014-06-22 MED ORDER — TRAMADOL HCL 50 MG PO TABS: 50.0000 mg | ORAL_TABLET | Freq: Two times a day (BID) | ORAL | Status: DC

## 2014-06-22 NOTE — Progress Notes (Signed)
Ok to refill 

## 2014-06-22 NOTE — Telephone Encounter (Signed)
Order from Dr. Johnnye Sima which can be found in the previous note.

## 2014-07-07 ENCOUNTER — Ambulatory Visit (INDEPENDENT_AMBULATORY_CARE_PROVIDER_SITE_OTHER): Payer: Commercial Managed Care - HMO | Admitting: Infectious Diseases

## 2014-07-07 VITALS — BP 101/67 | HR 60 | Temp 97.8°F | Wt 105.8 lb

## 2014-07-07 DIAGNOSIS — N893 Dysplasia of vagina, unspecified: Secondary | ICD-10-CM

## 2014-07-07 DIAGNOSIS — G44011 Episodic cluster headache, intractable: Secondary | ICD-10-CM | POA: Diagnosis not present

## 2014-07-07 DIAGNOSIS — M1990 Unspecified osteoarthritis, unspecified site: Secondary | ICD-10-CM | POA: Diagnosis not present

## 2014-07-07 DIAGNOSIS — B20 Human immunodeficiency virus [HIV] disease: Secondary | ICD-10-CM | POA: Diagnosis not present

## 2014-07-07 MED ORDER — EMTRICITAB-RILPIVIR-TENOFOV AF 200-25-25 MG PO TABS: 1.0000 | ORAL_TABLET | Freq: Every day | ORAL | Status: DC

## 2014-07-07 MED ORDER — TRAMADOL HCL 50 MG PO TABS: 50.0000 mg | ORAL_TABLET | Freq: Two times a day (BID) | ORAL | Status: DC

## 2014-07-07 NOTE — Assessment & Plan Note (Signed)
Will change her to edefosy for bone health. She has some worry about changing.   Will see her back in 3-4 months Offered/refused condoms.

## 2014-07-07 NOTE — Assessment & Plan Note (Signed)
Has reglar f/u with GYN, greatly appreciate their f/u.

## 2014-07-07 NOTE — Progress Notes (Signed)
   Subjective:    Patient ID: Laurie Gray, female    DOB: Nov 10, 1955, 59 y.o.   MRN: [id number]  HPI 59 yo F with hx of dx 2000 HIV+, depression, hyperlipidemia, prev lap hysterecomy-BSO (2009). Previously took D4T cocktail, went to undetectable. Took herself off for 5 years. CD4 went under 200 and was restarted on atripla.  Has been taking HCTZ, benicar for her BP.  Has had f/u with GYN this spring, SIL on colpo bx.  Has been doing well. Worried she is addicted to tramadol, feels like it works really well for her. She has seen in her PCP who told her she was depressed.   Has gotten dexa scan and has started  Fosamax.  Had normal pap this year, had mammogram normal  As well (per pt).  She is overwhelmed with caring for her grandson.   HIV 1 RNA QUANT (copies/mL)  Date Value  05/25/2014 <20  12/05/2013 <20  06/09/2013 <20   CD4 T CELL ABS (/uL)  Date Value  05/25/2014 580  12/05/2013 390*  06/09/2013 430     Review of Systems  Constitutional: Negative for appetite change and unexpected weight change.  Gastrointestinal: Negative for diarrhea and constipation.  Genitourinary: Negative for difficulty urinating.  Musculoskeletal: Positive for arthralgias.       Objective:   Physical Exam  Constitutional: She appears well-developed and well-nourished.  HENT:  Mouth/Throat: No oropharyngeal exudate.  Eyes: EOM are normal. Pupils are equal, round, and reactive to light.  Neck: Neck supple.  Cardiovascular: Normal rate, regular rhythm and normal heart sounds.   Pulmonary/Chest: Effort normal and breath sounds normal.  Abdominal: Soft. Bowel sounds are normal. There is no tenderness.  Musculoskeletal: She exhibits no edema.  Lymphadenopathy:    She has no cervical adenopathy.      Assessment & Plan:

## 2014-07-07 NOTE — Assessment & Plan Note (Signed)
Will have her seen by rheum at her request.  Will refill her tramadol. Will have her seen by subs abuse counselor.

## 2014-07-12 ENCOUNTER — Telehealth: Payer: Self-pay | Admitting: *Deleted

## 2014-07-12 NOTE — Telephone Encounter (Signed)
PA for Vernell Leep has been approved by [facility].  Pharmacy notified.

## 2014-07-12 NOTE — Telephone Encounter (Signed)
thanks

## 2014-07-13 ENCOUNTER — Other Ambulatory Visit: Payer: Self-pay | Admitting: Licensed Clinical Social Worker

## 2014-07-13 ENCOUNTER — Telehealth: Payer: Self-pay | Admitting: Licensed Clinical Social Worker

## 2014-07-13 DIAGNOSIS — M79641 Pain in right hand: Secondary | ICD-10-CM

## 2014-07-13 NOTE — Telephone Encounter (Signed)
[place] requesting referral faxed to [phone], attention [employer].  Done.

## 2014-07-13 NOTE — Telephone Encounter (Signed)
Faxed notes to [facility] for Rheumatology referral, and I also completed an online referral with them as well. Awaiting appointment time and date.

## 2014-07-17 DIAGNOSIS — H5210 Myopia, unspecified eye: Secondary | ICD-10-CM | POA: Diagnosis not present

## 2014-07-17 DIAGNOSIS — H524 Presbyopia: Secondary | ICD-10-CM | POA: Diagnosis not present

## 2014-07-19 ENCOUNTER — Ambulatory Visit: Payer: Commercial Managed Care - HMO | Admitting: *Deleted

## 2014-07-19 ENCOUNTER — Ambulatory Visit: Payer: Commercial Managed Care - HMO | Admitting: Infectious Diseases

## 2014-07-19 DIAGNOSIS — F411 Generalized anxiety disorder: Secondary | ICD-10-CM

## 2014-07-19 DIAGNOSIS — F329 Major depressive disorder, single episode, unspecified: Secondary | ICD-10-CM

## 2014-07-19 DIAGNOSIS — F32A Depression, unspecified: Secondary | ICD-10-CM

## 2014-07-20 ENCOUNTER — Telehealth: Payer: Self-pay | Admitting: Licensed Clinical Social Worker

## 2014-07-20 ENCOUNTER — Encounter: Payer: Self-pay | Admitting: Licensed Clinical Social Worker

## 2014-07-20 DIAGNOSIS — H34832 Tributary (branch) retinal vein occlusion, left eye: Secondary | ICD-10-CM | POA: Diagnosis not present

## 2014-07-20 DIAGNOSIS — H35373 Puckering of macula, bilateral: Secondary | ICD-10-CM | POA: Diagnosis not present

## 2014-07-20 DIAGNOSIS — H35352 Cystoid macular degeneration, left eye: Secondary | ICD-10-CM | POA: Diagnosis not present

## 2014-07-20 DIAGNOSIS — H2513 Age-related nuclear cataract, bilateral: Secondary | ICD-10-CM | POA: Diagnosis not present

## 2014-07-20 DIAGNOSIS — Z21 Asymptomatic human immunodeficiency virus [HIV] infection status: Secondary | ICD-10-CM | POA: Diagnosis not present
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
## 2014-07-20 NOTE — Telephone Encounter (Signed)
Left message on patient's voicemail about referral at [facility] for Rheumatology with Dr. Kandice Robinsons. July 28, at 1:15. Patient is to arrive 15 minutes early and bring ID and insurance card.

## 2014-07-24 ENCOUNTER — Ambulatory Visit: Payer: Commercial Managed Care - HMO | Admitting: Podiatry

## 2014-07-26 ENCOUNTER — Ambulatory Visit: Payer: Commercial Managed Care - HMO | Admitting: *Deleted

## 2014-07-27 NOTE — Telephone Encounter (Signed)
Received call from [facility] Rheumatology.  They will need the referral resent with Dr. Tonette Bihari name on it.  Per Apolonio Schneiders, they are unable to process this without his name, written by [place], on the referral.  Apolonio Schneiders is unable to tell me what information needs to be resent, they will just fax back the whole referral to [phone].  This process can take up to 24 hours per Apolonio Schneiders. Once the original referral is received, someone from [facility] must write Dr. Tonette Bihari name on it and fax it back to [phone]. Landis Gandy, RN

## 2014-08-03 ENCOUNTER — Ambulatory Visit: Payer: Commercial Managed Care - HMO | Admitting: Nurse Practitioner

## 2014-08-03 ENCOUNTER — Encounter: Payer: Self-pay | Admitting: Nurse Practitioner

## 2014-08-03 DIAGNOSIS — Z0289 Encounter for other administrative examinations: Secondary | ICD-10-CM

## 2014-08-10 ENCOUNTER — Ambulatory Visit (INDEPENDENT_AMBULATORY_CARE_PROVIDER_SITE_OTHER): Payer: Commercial Managed Care - HMO | Admitting: Nurse Practitioner

## 2014-08-10 ENCOUNTER — Encounter: Payer: Self-pay | Admitting: Nurse Practitioner

## 2014-08-10 VITALS — BP 148/86 | HR 59 | Temp 98.6°F | Wt 113.2 lb

## 2014-08-10 DIAGNOSIS — R739 Hyperglycemia, unspecified: Secondary | ICD-10-CM

## 2014-08-10 DIAGNOSIS — I10 Essential (primary) hypertension: Secondary | ICD-10-CM | POA: Diagnosis not present

## 2014-08-10 DIAGNOSIS — M8580 Other specified disorders of bone density and structure, unspecified site: Secondary | ICD-10-CM

## 2014-08-10 DIAGNOSIS — F411 Generalized anxiety disorder: Secondary | ICD-10-CM

## 2014-08-10 DIAGNOSIS — B20 Human immunodeficiency virus [HIV] disease: Secondary | ICD-10-CM

## 2014-08-10 MED ORDER — SERTRALINE HCL 50 MG PO TABS: 50.0000 mg | ORAL_TABLET | Freq: Every day | ORAL | Status: DC

## 2014-08-10 NOTE — Progress Notes (Signed)
Patient ID: Laurie Gray, female   DOB: 1955-11-25, 59 y.o.   MRN: [id number]    PCP: Lauree Chandler, NP  Allergies  Allergen Reactions  . Sulfonamide Derivatives Swelling    Chief Complaint  Patient presents with  . Medical Management of Chronic Issues    4 month follow-up     HPI: Patient is a 59 y.o. female seen in the office today for medical management of chronic conditions. Pt with pmh of HIV, arthritis, OP, hyperlipidemia.  Reports anxiety at time, panic attacks when she has to speak in crowd. Does not feel like it is overwhelming but would like something, has tried zoloft in the past that works well. Speaks with psychiatric counselor at [place] office  Pain is well controlled on ultram  eating a lot of sweets Exercises 3 times a week, 30 mins.  Does weights daily.  Review of Systems:  Review of Systems  Constitutional: Negative for activity change, appetite change, fatigue and unexpected weight change.  HENT: Negative for congestion and hearing loss.   Eyes: Negative.   Respiratory: Negative for cough and shortness of breath.   Cardiovascular: Negative for chest pain, palpitations and leg swelling.  Gastrointestinal: Negative for abdominal pain, diarrhea and constipation.  Genitourinary: Negative for dysuria and difficulty urinating.  Musculoskeletal: Positive for arthralgias. Negative for myalgias.  Skin: Negative for color change, rash and wound.  Neurological: Negative for dizziness, weakness and numbness.  Psychiatric/Behavioral: Negative for behavioral problems, confusion and agitation. The patient is nervous/anxious.     Past Medical History  Diagnosis Date  . HIV infection   . Hyperlipidemia   . Hypertension   . Pituitary adenoma    Past Surgical History  Procedure Laterality Date  . Abdominal hysterectomy    . Tonsillectomy    . Toe surgery    . Tubal ligation     Social History:   reports that she has never smoked. She has never used smokeless  tobacco. She reports that she drinks alcohol. She reports that she does not use illicit drugs.  Family History  Problem Relation Age of Onset  . Breast cancer Mother   . Hypertension Mother   . Cancer Mother   . Hyperlipidemia Mother   . Stroke Mother     Medications: Patient's Medications  New Prescriptions   No medications on file  Previous Medications   ALENDRONATE (FOSAMAX) 70 MG TABLET    Take 1 tablet (70 mg total) by mouth once a week. Take with a full glass of water on an empty stomach.   BENICAR HCT 20-12.5 MG PER TABLET    TAKE 1 TABLET BY MOUTH DAILY.   CYCLOBENZAPRINE (FLEXERIL) 5 MG TABLET    Take one po PRN   EMTRICITABINE-RILPIVIR-TENOFOVIR AF (ODEFSEY) 200-25-25 MG TABS PER TABLET    Take 1 tablet by mouth daily with breakfast.   FLUTICASONE (FLONASE) 50 MCG/ACT NASAL SPRAY    Place 1 spray into both nostrils as needed for rhinitis or allergies.   MULTIPLE VITAMINS-MINERALS (MULTIVITAMIN WITH MINERALS) TABLET    Take 1 tablet by mouth every morning.    OMEGA-3 ACID ETHYL ESTERS (LOVAZA) 1 G CAPSULE    TAKE 1 CAPSULE EVERY DAY   PRAVASTATIN (PRAVACHOL) 40 MG TABLET    TAKE 1 TABLET (40 MG TOTAL) BY MOUTH DAILY.   TRAMADOL (ULTRAM) 50 MG TABLET    Take 1 tablet (50 mg total) by mouth 2 (two) times daily.  Modified Medications   No medications on  file  Discontinued Medications   FLUCONAZOLE (DIFLUCAN) 150 MG TABLET    Take 1 tablet (150 mg total) by mouth once.     Physical Exam:  Filed Vitals:   08/10/14 1310  BP: 148/86  Pulse: 59  Temp: 98.6 F (37 C)  TempSrc: Oral  Weight: 113 lb 3.2 oz (51.347 kg)    Physical Exam  Constitutional: She is oriented to person, place, and time. She appears well-developed and well-nourished. No distress.  HENT:  Head: Normocephalic and atraumatic.  Mouth/Throat: Oropharynx is clear and moist. No oropharyngeal exudate.  Eyes: Conjunctivae are normal. Pupils are equal, round, and reactive to light.  Neck: Normal range of  motion. Neck supple.  Cardiovascular: Normal rate, regular rhythm and normal heart sounds.   Pulmonary/Chest: Effort normal and breath sounds normal.  Abdominal: Soft. Bowel sounds are normal.  Musculoskeletal: She exhibits no edema or tenderness.  Neurological: She is alert and oriented to person, place, and time.  Skin: Skin is warm and dry. She is not diaphoretic.  Psychiatric: She has a normal mood and affect.    Labs reviewed: Basic Metabolic Panel:  Recent Labs  11/02/13 1134 12/05/13 1636 03/28/14 0929  NA 143 143 144  K 3.7 3.6 3.9  CL 102 106 102  CO2 25 28 25   GLUCOSE 71 87 97  BUN 14 12 7   CREATININE 1.03* 0.86 0.82  CALCIUM 10.9* 10.8* 11.0*  TSH 1.510  --  1.250   Liver Function Tests:  Recent Labs  11/02/13 1134 12/05/13 1636 03/28/14 0929  AST 30 21 22   ALT 32 20 20  ALKPHOS 101 93 100  BILITOT <0.2 0.3 <0.2  PROT 6.5 6.5 6.7  ALBUMIN  --  4.1  --    No results for input(s): LIPASE, AMYLASE in the last 8760 hours. No results for input(s): AMMONIA in the last 8760 hours. CBC:  Recent Labs  11/02/13 1134 12/05/13 1636 03/28/14 0929 05/25/14 1555  WBC 3.4 5.7 7.0 3.8*  NEUTROABS 1.8  --  5.7 2.1  HGB 11.2 11.1* 11.3 11.4*  HCT 34.0 33.3* 34.4 34.9*  MCV 92 92.2 94 93.1  PLT  --  230 307 194   Lipid Panel:  Recent Labs  11/02/13 1134  CHOL 232*  HDL 86  LDLCALC 129*  TRIG 84  CHOLHDL 2.7   TSH:  Recent Labs  11/02/13 1134 03/28/14 0929  TSH 1.510 1.250   A1C: Lab Results  Component Value Date   HGBA1C 5.7* 03/28/2014     Assessment/Plan  1. Essential hypertension, benign Slightly elevated today, pt reports increased stress Reviewed blood pressure and stable on review, will have pt take blood pressure out of office and bring readings at next visit.  -conts on benicar HCTX daily  - Comprehensive metabolic panel  2. Osteopenia -conts on fosamax daily   3. Human immunodeficiency virus (HIV) disease -conts on  odefsey daily, ongoing follow up with ID  4. Hyperglycemia -educated to avoid sugar and increase sweets.  -will follow up Hemoglobin A1c  5. Anxiety state -increased anxiety and stress, would like to try medication -discussed risk/side effects, if any feelings of SI or HI while taking medication to stop and call office -to call office if with any adverse effect - sertraline (ZOLOFT) 50 MG tablet; Take 1 tablet (50 mg total) by mouth daily.  Dispense: 30 tablet; Refill: 3   Follow up in 4 weeks due to starting zoloft and HTN  Jessica K. Harle Battiest  [place] [phone] 8 am - 5 pm) [phone] (after hours)

## 2014-08-10 NOTE — Patient Instructions (Signed)
Start zoloft 50 mg daily for anxiety  Follow up in 4 weeks on anxiety   Will get blood work today

## 2014-08-11 ENCOUNTER — Ambulatory Visit (INDEPENDENT_AMBULATORY_CARE_PROVIDER_SITE_OTHER): Payer: Commercial Managed Care - HMO | Admitting: Podiatry

## 2014-08-11 ENCOUNTER — Ambulatory Visit (INDEPENDENT_AMBULATORY_CARE_PROVIDER_SITE_OTHER): Payer: Commercial Managed Care - HMO

## 2014-08-11 ENCOUNTER — Encounter: Payer: Self-pay | Admitting: Podiatry

## 2014-08-11 VITALS — BP 124/75 | HR 76 | Resp 18

## 2014-08-11 DIAGNOSIS — M2042 Other hammer toe(s) (acquired), left foot: Secondary | ICD-10-CM

## 2014-08-11 DIAGNOSIS — M205X1 Other deformities of toe(s) (acquired), right foot: Secondary | ICD-10-CM

## 2014-08-11 DIAGNOSIS — R52 Pain, unspecified: Secondary | ICD-10-CM | POA: Diagnosis not present

## 2014-08-11 LAB — COMPREHENSIVE METABOLIC PANEL
ALT: 21 IU/L (ref 0–32)
AST: 20 IU/L (ref 0–40)
Albumin/Globulin Ratio: 2.1 (ref 1.1–2.5)
Albumin: 4.4 g/dL (ref 3.5–5.5)
Alkaline Phosphatase: 73 IU/L (ref 39–117)
BUN/Creatinine Ratio: 12 (ref 9–23)
BUN: 13 mg/dL (ref 6–24)
Bilirubin Total: <0.2 mg/dL (ref 0.0–1.2)
CO2: 26 mmol/L (ref 18–29)
Calcium: 11.2 mg/dL — ABNORMAL HIGH (ref 8.7–10.2)
Chloride: 99 mmol/L (ref 97–108)
Creatinine, Ser: 1.08 mg/dL — ABNORMAL HIGH (ref 0.57–1.00)
GFR calc Af Amer: 65 mL/min/{1.73_m2} (ref >59)
GFR calc non Af Amer: 57 mL/min/{1.73_m2} — ABNORMAL LOW (ref >59)
Globulin, Total: 2.1 g/dL (ref 1.5–4.5)
Glucose: 85 mg/dL (ref 65–99)
Potassium: 4.4 mmol/L (ref 3.5–5.2)
SODIUM: 140 mmol/L (ref 134–144)
Total Protein: 6.5 g/dL (ref 6.0–8.5)

## 2014-08-11 LAB — HEMOGLOBIN A1C
Est. average glucose Bld gHb Est-mCnc: 123 mg/dL
Hgb A1c MFr Bld: 5.9 % — ABNORMAL HIGH (ref 4.8–5.6)

## 2014-08-11 NOTE — Patient Instructions (Signed)
Pre-Operative Instructions  Congratulations, you have decided to take an important step to improving your quality of life.  You can be assured that the doctors of [facility] will be with you every step of the way.  1. Plan to be at the surgery center/hospital at least 1 (one) hour prior to your scheduled time unless otherwise directed by the surgical center/hospital staff.  You must have a responsible adult accompany you, remain during the surgery and drive you home.  Make sure you have directions to the surgical center/hospital and know how to get there on time. 2. For hospital based surgery you will need to obtain a history and physical form from your family physician within 1 month prior to the date of surgery- we will give you a form for you primary physician.  3. We make every effort to accommodate the date you request for surgery.  There are however, times where surgery dates or times have to be moved.  We will contact you as soon as possible if a change in schedule is required.   4. No Aspirin/Ibuprofen for one week before surgery.  If you are on aspirin, any non-steroidal anti-inflammatory medications (Mobic, Aleve, Ibuprofen) you should stop taking it 7 days prior to your surgery.  You make take Tylenol  For pain prior to surgery.  5. Medications- If you are taking daily heart and blood pressure medications, seizure, reflux, allergy, asthma, anxiety, pain or diabetes medications, make sure the surgery center/hospital is aware before the day of surgery so they may notify you which medications to take or avoid the day of surgery. 6. No food or drink after midnight the night before surgery unless directed otherwise by surgical center/hospital staff. 7. No alcoholic beverages 24 hours prior to surgery.  No smoking 24 hours prior to or 24 hours after surgery. 8. Wear loose pants or shorts- loose enough to fit over bandages, boots, and casts. 9. No slip on shoes, sneakers are best. 10. Bring  your boot with you to the surgery center/hospital.  Also bring crutches or a walker if your physician has prescribed it for you.  If you do not have this equipment, it will be provided for you after surgery. 11. If you have not been contracted by the surgery center/hospital by the day before your surgery, call to confirm the date and time of your surgery. 12. Leave-time from work may vary depending on the type of surgery you have.  Appropriate arrangements should be made prior to surgery with your employer. 13. Prescriptions will be provided immediately following surgery by your doctor.  Have these filled as soon as possible after surgery and take the medication as directed. 14. Remove nail polish on the operative foot. 15. Wash the night before surgery.  The night before surgery wash the foot and leg well with the antibacterial soap provided and water paying special attention to beneath the toenails and in between the toes.  Rinse thoroughly with water and dry well with a towel.  Perform this wash unless told not to do so by your physician.  Enclosed: 1 Ice pack (please put in freezer the night before surgery)   1 Hibiclens skin cleaner   Pre-op Instructions  If you have any questions regarding the instructions, do not hesitate to call our office.  [facility]: [address] , [place] [address] [phone]  [facility]: [address] [place], [place] [address] [phone]  [facility]: [address]  [place],  [address] [phone]  Dr. Richard   Tuchman DPM, Dr. Norman Regal DPM Dr. Richard Sikora DPM, Dr. M. Todd Hyatt DPM, Dr. Kathryn Egerton DPM, Dr. Matthew Wagoner DPM 

## 2014-08-11 NOTE — Progress Notes (Signed)
   Subjective:    Patient ID: Laurie Gray, female    DOB: December 21, 1955, 59 y.o.   MRN: [id number]  HPI  59 year old female presents to the office for a painful "bump" on the side of her right foot. She also states she has a hammertoe on the left 3rd toe. She states the areas are painful, particularly with shoes and pressure. She gets irritation problems however foot on the painful bump. She has tried shoe gear modifications and padding and offloading or any relief of symptoms. She states the pain has become a daily issue is progressive. She states that she broke her left third toe last year and since then she has had hammertoe. She is also tried shoe gear changes and padding offloading. She says the toes painful as painful as the right side. No other complaints at this time.   Review of Systems  Musculoskeletal:       Joint pain  Allergic/Immunologic: Positive for food allergies.  Neurological: Positive for headaches.  Psychiatric/Behavioral: The patient is nervous/anxious.   All other systems reviewed and are negative.      Objective:   Physical Exam AAO x3, NAD DP/PT pulses palpable bilaterally, CRT less than 3 seconds Protective sensation intact with Simms Weinstein monofilament, vibratory sensation intact, Achilles tendon reflex intact There is a mild tailors bunion deformity present on the right foot with tenderness over the lateral aspect of the fifth metatarsal head. There is slight irritation from shoe gear. There's no pain with MTPJ range of motion. There is no overlying edema, erythema, increased warmth. Left third digit hammertoe contracture which is rigid. There is mild irritation over the dorsal PIPJ. There is no specific area pinpoint bony tenderness or pain the vibratory sensation. No areas of tenderness to bilateral lower extremities. MMT 5/5, ROM WNL.  No open lesions or pre-ulcerative lesions.  No overlying edema, erythema, increase in warmth to bilateral lower  extremities.  No pain with calf compression, swelling, warmth, erythema bilaterally.      Assessment & Plan:  59 year old female with right tailor's bunion, left third digit hammertoe -X-rays were obtained and reviewed with the patient.  -Treatment options discussed including all alternatives, risks, and complications -I discussed both conservative and surgical treatment options. This time she is attended multiple conservative treatments without any relief of symptoms. At this time she is requesting surgical intervention on the right foot help decrease her pain and deformity. -The proposed surgery is right fifth metatarsal exostectomy. She agrees to this and wishes to proceed. -The incision placement as well as the postoperative course was discussed with the patient. I discussed risks of the surgery which include, but not limited to, infection, bleeding, pain, swelling, need for further surgery, delayed or nonhealing, painful or ugly scar, numbness or sensation changes, over/under correction, recurrence, transfer lesions, further deformity, hardware failure, DVT/PE, loss of toe/foot. Patient understands these risks and wishes to proceed with surgery. The surgical consent was reviewed with the patient all 3 pages were signed. No promises or guarantees were given to the outcome of the procedure. All questions were answered to the best of my ability. Before the surgery the patient was encouraged to call the office if there is any further questions. The surgery will be performed at the [facility] on an outpatient basis.  Celesta Gentile, DPM

## 2014-08-14 ENCOUNTER — Other Ambulatory Visit: Payer: Self-pay | Admitting: Infectious Diseases

## 2014-08-14 DIAGNOSIS — I10 Essential (primary) hypertension: Secondary | ICD-10-CM

## 2014-08-17 ENCOUNTER — Other Ambulatory Visit: Payer: Self-pay | Admitting: Infectious Diseases

## 2014-08-17 DIAGNOSIS — M25579 Pain in unspecified ankle and joints of unspecified foot: Secondary | ICD-10-CM

## 2014-08-17 NOTE — Telephone Encounter (Signed)
Frequency of Ultram refills.  Phone call to [facility] to receive a list of when the pt refilled Ultram.  Here are the dates that pt has refilled since January 06, 2014:  1/26, 2/22 (4 days early), 3/24, 4/20 ( 4 days early), 5/19 (1 day early), requested by pt 6/13 [facility] filled on 6/16 (3 days early), 7/13 (3 days early), pt requested today 08/17/14 (3 days early).  Will need for Dr. Johnnye Sima to review this information.  The pt also has a PCP, [name], NP.  Pt is also scheduled for foot surgery by Dr. Celesta Gentile.  RN spoke with patient about this refill and advised future refills be requested from PCP or Podiatrist.  Pt verbalized understanding.

## 2014-08-22 ENCOUNTER — Ambulatory Visit: Payer: Commercial Managed Care - HMO | Admitting: *Deleted

## 2014-08-31 ENCOUNTER — Ambulatory Visit: Payer: Commercial Managed Care - HMO | Admitting: *Deleted

## 2014-08-31 DIAGNOSIS — F411 Generalized anxiety disorder: Secondary | ICD-10-CM
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
## 2014-09-08 ENCOUNTER — Other Ambulatory Visit: Payer: Self-pay | Admitting: Infectious Diseases

## 2014-09-12 ENCOUNTER — Other Ambulatory Visit: Payer: Self-pay | Admitting: Infectious Diseases

## 2014-09-14 ENCOUNTER — Ambulatory Visit: Payer: Commercial Managed Care - HMO | Admitting: *Deleted

## 2014-09-14 ENCOUNTER — Other Ambulatory Visit: Payer: Self-pay | Admitting: Infectious Diseases

## 2014-09-25 ENCOUNTER — Telehealth: Payer: Self-pay | Admitting: *Deleted

## 2014-09-25 NOTE — Telephone Encounter (Signed)
"  I'm scheduled for surgery on 10/04/2014.  I don't know where my surgery is going to be nor what time.  Will it be done at your office?"  Your surgery will be done at [facility].  There's a pamphlet in the bag that we gave you.  "The little blue bag?"  Yes, that is it.  They normally call you a day or two prior to surgery.  They will give you the arrival time.  "Okay, thank you."

## 2014-10-02 ENCOUNTER — Telehealth: Payer: Self-pay | Admitting: *Deleted

## 2014-10-02 NOTE — Telephone Encounter (Signed)
I'm calling to check on the status of an authorization request for outpatient surgery on this patient.  "It has been authorized for one visit scheduled for 10/04/2014.  Authorization number is [id number]."    I called and left a message for Caren Griffins about the authorization number.

## 2014-10-04 ENCOUNTER — Encounter: Payer: Self-pay | Admitting: *Deleted

## 2014-10-04 DIAGNOSIS — M205X1 Other deformities of toe(s) (acquired), right foot: Secondary | ICD-10-CM | POA: Diagnosis not present

## 2014-10-04 DIAGNOSIS — M2011 Hallux valgus (acquired), right foot: Secondary | ICD-10-CM | POA: Diagnosis not present

## 2014-10-04 DIAGNOSIS — I10 Essential (primary) hypertension: Secondary | ICD-10-CM | POA: Diagnosis not present

## 2014-10-06 ENCOUNTER — Telehealth: Payer: Self-pay | Admitting: *Deleted

## 2014-10-06 NOTE — Telephone Encounter (Signed)
I attempted to call patient to see how she was doing from her surgery, no answer.  I left her a message to call if she has any questions or concerns.

## 2014-10-06 NOTE — Telephone Encounter (Signed)
Pt states someone called her and she missed the call, she thought it was the nurse checking on her.  I told her I would be happy to check on her.  I asked how she felt and she said she had a lot of pain when she got home but it was better now with the pain medication.  I told pt to stay in the surgical boot at all times, weight bearing only 5 minutes/hr, ice, elevate, keep the area dry and to call with concerns.

## 2014-10-06 NOTE — Progress Notes (Signed)
Surgery performed at [facility] for [place] right foot by Dr. Jacqualyn Posey.  Prescription was written for Percocet 5/325 mg quantity 40 and Phenergan 25 mg quantity 30.

## 2014-10-09 ENCOUNTER — Encounter: Payer: Self-pay | Admitting: Podiatry

## 2014-10-10 ENCOUNTER — Telehealth: Payer: Self-pay | Admitting: *Deleted

## 2014-10-10 NOTE — Telephone Encounter (Signed)
Pt states she missed her 1st POV with Dr. Jacqualyn Posey, pt asked if she could change her dressing.  Left message informing pt to leave dressing in place, and call to schedule an appt to see doctor this week.

## 2014-10-11 ENCOUNTER — Other Ambulatory Visit: Payer: Commercial Managed Care - HMO

## 2014-10-11 DIAGNOSIS — B20 Human immunodeficiency virus [HIV] disease: Secondary | ICD-10-CM | POA: Diagnosis not present

## 2014-10-12 LAB — T-HELPER CELL (CD4) - (RCID CLINIC ONLY)
CD4 % Helper T Cell: 39 % (ref 33–55)
CD4 T Cell Abs: 640 /uL (ref 400–2700)

## 2014-10-12 NOTE — Progress Notes (Signed)
No show

## 2014-10-13 ENCOUNTER — Ambulatory Visit (INDEPENDENT_AMBULATORY_CARE_PROVIDER_SITE_OTHER): Payer: Commercial Managed Care - HMO

## 2014-10-13 ENCOUNTER — Ambulatory Visit (INDEPENDENT_AMBULATORY_CARE_PROVIDER_SITE_OTHER): Payer: Commercial Managed Care - HMO | Admitting: Podiatry

## 2014-10-13 ENCOUNTER — Encounter: Payer: Self-pay | Admitting: Podiatry

## 2014-10-13 VITALS — BP 156/86 | HR 57 | Resp 18

## 2014-10-13 DIAGNOSIS — M205X1 Other deformities of toe(s) (acquired), right foot: Secondary | ICD-10-CM

## 2014-10-13 DIAGNOSIS — Z9889 Other specified postprocedural states: Secondary | ICD-10-CM

## 2014-10-13 DIAGNOSIS — R52 Pain, unspecified: Secondary | ICD-10-CM

## 2014-10-13 LAB — HIV-1 RNA QUANT-NO REFLEX-BLD: HIV 1 RNA Quant: <20 copies/mL (ref <20)

## 2014-10-13 MED ORDER — OXYCODONE-ACETAMINOPHEN 5-325 MG PO TABS: 1.0000 | ORAL_TABLET | Freq: Four times a day (QID) | ORAL | Status: DC | PRN

## 2014-10-13 MED ORDER — DOCUSATE SODIUM 100 MG PO CAPS: 100.0000 mg | ORAL_CAPSULE | Freq: Two times a day (BID) | ORAL | Status: DC

## 2014-10-16 NOTE — Progress Notes (Signed)
Patient ID: ARTI TRANG, female   DOB: 03-Jun-1955, 59 y.o.   MRN: [id number]  DOS: 10/04/14 s/p right tailors bunionectomy/exostectomy  Subjective: 59 year old female presents the office today postop visit #1 status post right foot surgery. She states that overall she is doing well. She gets some tenderness on the side of the surgical site. She is continue the surgical shoe. She finished her course of antibiotic as. She's taking the medication as needed. Denies any systemic complaints such as fevers, chills, nausea, vomiting. No calf pain, chest pain, shortness of breath. No other complaints at this time in no acute changes otherwise.  Objective: AAO 3, NAD Neurovascular status intact and unchanged There is incisional the dorsal lateral aspect of the right foot overlying the fifth metatarsal head. The incision is well coapted without any evidence of dehiscence and sutures are intact. There is no surrounding erythema, ascending cellulitis, fluctuance, crepitus, malodor, drainage/purulence. There is mild edema about surgical site mild tenderness palpation gently around the surgery site. No other areas of tenderness to bilateral lower extremity is. No other open lesions or pre-ulcerative lesions. There is no pain with calf compression, swelling, warmth, erythema.  Assessment: 59 year old female 1 week status post right tailors bunionectomy/exostectomy  Plan: -Treatment options discussed including all alternatives, risks, and complications -X-rays were obtained and reviewed with the patient.  -Antibiotic ointment was placed over the incision followed by dry sterile dressing. Keep dressing clean, dry, intact. -Continue surgical shoe. -Ice elevation. -Pain medication as needed. -Monitor for any clinical signs or symptoms of infection and directed to call the office immediately should any occur or go to the ER. -Follow-up in 1 week for likely suture removal or sooner if any problems arise. In the  meantime, encouraged to call the office with any questions, concerns, change in symptoms.   Celesta Gentile, DPM

## 2014-10-17 ENCOUNTER — Ambulatory Visit: Payer: Commercial Managed Care - HMO | Admitting: *Deleted

## 2014-10-17 DIAGNOSIS — F32A Depression, unspecified: Secondary | ICD-10-CM

## 2014-10-17 DIAGNOSIS — F329 Major depressive disorder, single episode, unspecified: Secondary | ICD-10-CM

## 2014-10-17 NOTE — BH Specialist Note (Signed)
Sibley was present for her scheduled appointment today.  Client was oriented times four with good affect.  Client was alert and talkative. Client was dressed very nice today and appeared to be in great spirits as evidenced by talkting a lot, joking around with staff, and making a lot of eye contact.  Client communicated that she was feeling pretty good but had just had foot surgery and was a little sore.  Client denied any substance use at this time but did say she was prescribed very strong pain medication. Counselor warned client to be careful and to always adhere to the directions her provider gives her regarding the taking of such medication and recognize the potential for addiction.  Client agreed and stated that she was actually considering stopping the pain medication and taking something a little more minor for her pain.  Counselor recommended that client keep in close contact with her provider regarding such prescriptions and make him aware of her needs.  Client shared that she was trying to get out of the house a little more and pursue some activities with friends.  Client stated that she reflected on last counseling session and realized that she was probably making more of a deal about her speech impediment than others saw it or thought about it. Client said that she has always let it keep her from being as uninhibited as she could be in order to protect herself and avoid embarrassment.  Counselor empowered client to ignore ignorant people and explained that excessive worry can lead to anxiety and negative emotional health.  Client agreed and requested another appointment in a few weeks.  Rolena Infante, LPCA, MA [facility]

## 2014-10-20 ENCOUNTER — Ambulatory Visit (INDEPENDENT_AMBULATORY_CARE_PROVIDER_SITE_OTHER): Payer: Commercial Managed Care - HMO

## 2014-10-20 DIAGNOSIS — M205X1 Other deformities of toe(s) (acquired), right foot: Secondary | ICD-10-CM | POA: Diagnosis not present

## 2014-10-20 DIAGNOSIS — Z9889 Other specified postprocedural states: Secondary | ICD-10-CM

## 2014-10-20 NOTE — Progress Notes (Signed)
DOS: 10/04/14 s/p right tailors bunionectomy/exostectomy. Pt presents for post op visit #2. Dissolvable sutures present, knots trimmed. She stated she was managing pain very well. Advised her that she could get foot wet but to avoid soaking or submerging foot into water. Surgical site was healing well,closed, aligned and approximated. No s/s of infection or drainage noted. Advised pt to remain in surgical shoe until next appt, compression wrap was reapplied. She is to call if any acute changes or symptoms occur. Follow up with Dr Jacqualyn Posey in 2 weeks.

## 2014-10-30 ENCOUNTER — Other Ambulatory Visit: Payer: Self-pay | Admitting: Infectious Diseases

## 2014-10-31 ENCOUNTER — Ambulatory Visit: Payer: Commercial Managed Care - HMO | Admitting: *Deleted

## 2014-11-05 ENCOUNTER — Other Ambulatory Visit: Payer: Self-pay | Admitting: Infectious Diseases

## 2014-11-06 ENCOUNTER — Ambulatory Visit (INDEPENDENT_AMBULATORY_CARE_PROVIDER_SITE_OTHER): Payer: Commercial Managed Care - HMO

## 2014-11-06 ENCOUNTER — Encounter: Payer: Self-pay | Admitting: Podiatry

## 2014-11-06 ENCOUNTER — Ambulatory Visit (INDEPENDENT_AMBULATORY_CARE_PROVIDER_SITE_OTHER): Payer: Commercial Managed Care - HMO | Admitting: Podiatry

## 2014-11-06 ENCOUNTER — Other Ambulatory Visit: Payer: Self-pay | Admitting: Nurse Practitioner

## 2014-11-06 VITALS — BP 138/82 | HR 53 | Resp 18

## 2014-11-06 DIAGNOSIS — M205X1 Other deformities of toe(s) (acquired), right foot: Secondary | ICD-10-CM | POA: Diagnosis not present

## 2014-11-06 DIAGNOSIS — Z9889 Other specified postprocedural states: Secondary | ICD-10-CM

## 2014-11-07 NOTE — Progress Notes (Signed)
Patient ID: Laurie Gray, female   DOB: 1955-09-13, 59 y.o.   MRN: [id number]  DOS: 10/04/14 s/p right tailors bunionectomy/exostectomy  Subjective: 59 year old female presents the office today status post right foot surgery. She states that overall she is doing well. She is unable to wear regular shoe without any discomfort. She does where a Band-Aid around the area some padding infection. No recent injury or trauma. No other complaints at this time. No acute changes his last appointment. She denies any systemic complaints such as fevers, chills, nausea, vomiting. No calf pain, chest pain, shortness of breath.  Objective: AAO 3, NAD Neurovascular status intact and unchanged There is an incisional the dorsal lateral aspect of the right foot overlying the fifth metatarsal head  Which appears to be well coapted.  Is a small scab off the distal aspect of the incision however upon removal of the underlying skin is intact. Scar is formed. There is minimal edema around the surgical site without any associated erythema or increased warmth. There is no tenderness palpation of the surgical site. No areas of tenderness bilateral lower extremities. No other open lesions or pre-ulcerative lesions. There is no pain with calf compression, swelling, warmth, erythema.  Assessment: 59 year old female  status post right tailors bunionectomy/exostectomy, doing well.   Plan: -Treatment options discussed including all alternatives, risks, and complications -X-rays were obtained and reviewed with the patient.  -Continue with regular shoe gear as tolerated. -ice and elevation as needed. Ankle compression stocking as needed. -Monitor for any clinical signs or symptoms of infection and directed to call the office immediately should any occur or go to the ER. -Follow-up in 4 week for likely suture removal or sooner if any problems arise. In the meantime, encouraged to call the office with any questions, concerns, change  in symptoms.   Celesta Gentile, DPM

## 2014-11-08 ENCOUNTER — Telehealth: Payer: Self-pay | Admitting: *Deleted

## 2014-11-08 ENCOUNTER — Encounter: Payer: Self-pay | Admitting: Infectious Diseases

## 2014-11-08 ENCOUNTER — Ambulatory Visit (INDEPENDENT_AMBULATORY_CARE_PROVIDER_SITE_OTHER): Payer: Commercial Managed Care - HMO | Admitting: Infectious Diseases

## 2014-11-08 VITALS — BP 118/72 | HR 65 | Temp 98.5°F | Ht 65.0 in | Wt 119.0 lb

## 2014-11-08 DIAGNOSIS — B20 Human immunodeficiency virus [HIV] disease: Secondary | ICD-10-CM

## 2014-11-08 DIAGNOSIS — E785 Hyperlipidemia, unspecified: Secondary | ICD-10-CM

## 2014-11-08 DIAGNOSIS — Z79899 Other long term (current) drug therapy: Secondary | ICD-10-CM | POA: Diagnosis not present

## 2014-11-08 DIAGNOSIS — Z9889 Other specified postprocedural states: Secondary | ICD-10-CM | POA: Diagnosis not present

## 2014-11-08 DIAGNOSIS — Z23 Encounter for immunization: Secondary | ICD-10-CM | POA: Diagnosis not present

## 2014-11-08 LAB — LIPID PANEL
CHOL/HDL RATIO: 2.4 ratio (ref <=5.0)
Cholesterol: 193 mg/dL (ref 125–200)
HDL: 80 mg/dL (ref >=46)
LDL CALC: 100 mg/dL (ref <130)
TRIGLYCERIDES: 64 mg/dL (ref <150)
VLDL: 13 mg/dL (ref <30)

## 2014-11-08 NOTE — Assessment & Plan Note (Signed)
Lab Results  Component Value Date   CHOL 232* 11/02/2013   HDL 86 11/02/2013   LDLCALC 129* 11/02/2013   TRIG 84 11/02/2013   CHOLHDL 2.7 11/02/2013     Will check lipids today

## 2014-11-08 NOTE — Assessment & Plan Note (Signed)
Her wound is redressed.  i encouraged her to wear socks to keep her shoes from abraiding her wound.

## 2014-11-08 NOTE — Telephone Encounter (Signed)
As patient was leaving her office visit, she stopped this RN in the hall.  She would like to ask Dr. Johnnye Sima to refill her Ultram for her foot pain.   Please advise. Patient is following up with her podiatrist and her PCP soon, but will be out before she sees them. Landis Gandy, RN

## 2014-11-08 NOTE — Assessment & Plan Note (Signed)
She is doing very well.  Encourage her to take rx with food.  Offered/refused condoms.  She gets flu shot today. Will recheck her Hep B S Ab Will see her back in 6 months

## 2014-11-08 NOTE — Progress Notes (Signed)
   Subjective:    Patient ID: Laurie Gray, female    DOB: 10-25-1955, 59 y.o.   MRN: [id number]  HPI 59 yo F with hx of dx 2000 HIV+, depression, hyperlipidemia, prev lap hysterecomy-BSO (2009). Previously took D4T cocktail, went to undetectable. Took herself off for 5 years. CD4 went under 200 and was restarted on atripla.  Has been taking HCTZ, benicar for her BP.  Has had f/u with GYN 04-2014 (nl). Has had previous abn on colpo (SIL).  Still has pain in RLQ- afraid it could be scar tissue from her surgery (has had hysterectomy, then bilateral salpingoophorectomy).  Had normal mammo 03-2014 Had colon 2013.  At her previous visit was changed to [facility] after she had been started on fosamax for low bone mineral density.  Has gained w (10#)t, has had no side effects.  Has been taking stool softener due to pain meds for bunionnectomy. Wound has healed well, she is off pain rx.   HIV 1 RNA QUANT (copies/mL)  Date Value  10/11/2014 <20  05/25/2014 <20  12/05/2013 <20   CD4 T CELL ABS (/uL)  Date Value  10/11/2014 640  05/25/2014 580  12/05/2013 390*     Review of Systems  Constitutional: Negative for appetite change and unexpected weight change.  Gastrointestinal: Positive for constipation. Negative for diarrhea.  Genitourinary: Negative for difficulty urinating and menstrual problem.  having trouble with hemmerhoids.      Objective:   Physical Exam  Constitutional: She appears well-developed and well-nourished.  HENT:  Mouth/Throat: No oropharyngeal exudate.  Eyes: EOM are normal. Pupils are equal, round, and reactive to light.  Neck: Neck supple.  Cardiovascular: Normal rate, regular rhythm and normal heart sounds.   Pulmonary/Chest: Effort normal and breath sounds normal.  Abdominal: Soft. Bowel sounds are normal. There is no tenderness. There is no rebound.  Musculoskeletal:       Feet:  Lymphadenopathy:    She has no cervical adenopathy.       Assessment &  Plan:

## 2014-11-09 LAB — HEPATITIS B SURFACE ANTIBODY,QUALITATIVE: Hep B S Ab: POSITIVE — AB

## 2014-11-09 NOTE — Telephone Encounter (Signed)
i would ask her PCP to address

## 2014-11-10 NOTE — Telephone Encounter (Signed)
Unable to call patient - when dialing the number listed in her chart, it states that it is not a working number.

## 2014-11-13 ENCOUNTER — Other Ambulatory Visit: Payer: Self-pay | Admitting: Infectious Diseases

## 2014-11-14 ENCOUNTER — Ambulatory Visit: Payer: Commercial Managed Care - HMO | Admitting: *Deleted

## 2014-11-16 ENCOUNTER — Other Ambulatory Visit: Payer: Self-pay | Admitting: *Deleted

## 2014-11-16 MED ORDER — TRAMADOL HCL 50 MG PO TABS: 50.0000 mg | ORAL_TABLET | Freq: Two times a day (BID) | ORAL | Status: DC

## 2014-11-16 NOTE — Telephone Encounter (Signed)
That is fine,t hank you.

## 2014-11-16 NOTE — Telephone Encounter (Signed)
Rx called to pharmacy and patient notified.

## 2014-11-16 NOTE — Telephone Encounter (Signed)
Patient called stating she can not get into her PCP for another month and is requesting another refill of the tramadol. Please advise

## 2014-11-20 ENCOUNTER — Telehealth: Payer: Self-pay | Admitting: Infectious Diseases

## 2014-11-20 NOTE — Telephone Encounter (Signed)
Patient left a vm on the medication assistance line stating her tramadol is not at the pharmacy, was told it would be sent to [facility] on Friday.

## 2014-11-20 NOTE — Telephone Encounter (Signed)
Called pharmacy and they did not get the voice mail. Gave a verbal order and patient notified. Laurie Gray

## 2014-11-21 ENCOUNTER — Ambulatory Visit (INDEPENDENT_AMBULATORY_CARE_PROVIDER_SITE_OTHER): Payer: Commercial Managed Care - HMO | Admitting: Nurse Practitioner

## 2014-11-21 ENCOUNTER — Encounter: Payer: Self-pay | Admitting: Nurse Practitioner

## 2014-11-21 ENCOUNTER — Encounter: Payer: Self-pay | Admitting: Physician Assistant

## 2014-11-21 VITALS — BP 118/72 | HR 55 | Temp 98.4°F | Ht 65.0 in | Wt 122.0 lb

## 2014-11-21 DIAGNOSIS — M899 Disorder of bone, unspecified: Secondary | ICD-10-CM

## 2014-11-21 DIAGNOSIS — M949 Disorder of cartilage, unspecified: Secondary | ICD-10-CM | POA: Diagnosis not present

## 2014-11-21 DIAGNOSIS — B20 Human immunodeficiency virus [HIV] disease: Secondary | ICD-10-CM | POA: Diagnosis not present

## 2014-11-21 DIAGNOSIS — I10 Essential (primary) hypertension: Secondary | ICD-10-CM

## 2014-11-21 DIAGNOSIS — E785 Hyperlipidemia, unspecified: Secondary | ICD-10-CM

## 2014-11-21 DIAGNOSIS — Z9889 Other specified postprocedural states: Secondary | ICD-10-CM | POA: Diagnosis not present

## 2014-11-21 DIAGNOSIS — G8929 Other chronic pain: Secondary | ICD-10-CM | POA: Diagnosis not present

## 2014-11-21 DIAGNOSIS — R1031 Right lower quadrant pain: Secondary | ICD-10-CM

## 2014-11-21 DIAGNOSIS — M1990 Unspecified osteoarthritis, unspecified site: Secondary | ICD-10-CM | POA: Diagnosis not present

## 2014-11-21 MED ORDER — TRAMADOL HCL 50 MG PO TABS: 25.0000 mg | ORAL_TABLET | Freq: Two times a day (BID) | ORAL | Status: DC | PRN

## 2014-11-21 NOTE — Progress Notes (Signed)
Patient ID: Laurie Gray, female   DOB: [birth year], 59 y.o.   MRN: [id number]    PCP: Lauree Chandler, NP  Allergies  Allergen Reactions  . Sulfonamide Derivatives Swelling    Chief Complaint  Patient presents with  . Medical Management of Chronic Issues    3 month follow-up   . GI Problem    Stomach pain off/on x longtime  . Medication Management    Patient would like to know if PCP will take over prescribing Tramadol     HPI: Patient is a 59 y.o. female seen in the office today for medical management of chronic conditions. Pt with pmh of HIV, HTN, arthritis, OP, hyperlipidemia. Missed 4 week follow up appt. Was placed on zoloft but no longer taking. Following with counselor for support and ID. Pt had right foot surgery in September due to right bunion, has been following with podiatry regarding this. conts to follow up with ID for HIV.  Reports she is taking tramadol for pain and was placed on over a year ago and takes twice daily, when she misses a dose she had headaches and feels very bad after misses 1-2 dose. Feels like she can not function.  Having right lower quadrant pain, this is not new pain. Had CT scan last year and was negative. Pain has been the same, no worsening of pain. Hx of total hysterectomy. Question scar tissue, had 2 surgeries- uterus and then ovaries  Worse after intercourse Last colonoscopy was when she was 49.  Review of Systems:  Review of Systems  Constitutional: Negative for activity change, appetite change, fatigue and unexpected weight change.  HENT: Negative for congestion and hearing loss.   Eyes: Negative.   Respiratory: Negative for cough and shortness of breath.   Cardiovascular: Negative for chest pain, palpitations and leg swelling.  Gastrointestinal: Positive for abdominal pain. Negative for nausea, vomiting, diarrhea and constipation.  Genitourinary: Negative for dysuria and difficulty urinating.  Musculoskeletal: Positive for  arthralgias. Negative for myalgias.  Skin: Negative for color change, rash and wound.  Neurological: Negative for dizziness, weakness and numbness.  Psychiatric/Behavioral: Negative for behavioral problems, confusion and agitation. The patient is nervous/anxious.     Past Medical History  Diagnosis Date  . HIV infection ([place])   . Hyperlipidemia   . Hypertension   . Pituitary adenoma [facility])    Past Surgical History  Procedure Laterality Date  . Abdominal hysterectomy    . Tonsillectomy    . Toe surgery    . Tubal ligation     Social History:   reports that she has never smoked. She has never used smokeless tobacco. She reports that she drinks alcohol. She reports that she does not use illicit drugs.  Family History  Problem Relation Age of Onset  . Breast cancer Mother   . Hypertension Mother   . Cancer Mother   . Hyperlipidemia Mother   . Stroke Mother     Medications: Patient's Medications  New Prescriptions   No medications on file  Previous Medications   ALENDRONATE (FOSAMAX) 70 MG TABLET    Take 1 tablet (70 mg total) by mouth once a week. Take with a full glass of water on an empty stomach.   BENICAR HCT 20-12.5 MG TABLET    TAKE 1 TABLET BY MOUTH EVERY DAY   CYCLOBENZAPRINE (FLEXERIL) 5 MG TABLET    Take one po PRN   EMTRICITABINE-RILPIVIR-TENOFOVIR AF (ODEFSEY) 200-25-25 MG TABS PER TABLET    Take  1 tablet by mouth daily with breakfast.   FLUTICASONE (FLONASE) 50 MCG/ACT NASAL SPRAY    Place 1 spray into both nostrils as needed for rhinitis or allergies.   OMEGA-3 ACID ETHYL ESTERS (LOVAZA) 1 G CAPSULE    TAKE 1 CAPSULE EVERY DAY   PRAVASTATIN (PRAVACHOL) 40 MG TABLET    TAKE 1 TABLET (40 MG TOTAL) BY MOUTH DAILY.   PROMETHAZINE (PHENERGAN) 25 MG TABLET    Take 25 mg by mouth every 8 (eight) hours as needed for nausea or vomiting.   TRAMADOL (ULTRAM) 50 MG TABLET    Take 1 tablet (50 mg total) by mouth 2 (two) times daily.  Modified Medications   No medications on  file  Discontinued Medications   DOCUSATE SODIUM (COLACE) 100 MG CAPSULE    Take 1 capsule (100 mg total) by mouth 2 (two) times daily.   MULTIPLE VITAMINS-MINERALS (MULTIVITAMIN WITH MINERALS) TABLET    Take 1 tablet by mouth every morning.      Physical Exam:  Filed Vitals:   11/21/14 0834  BP: 118/72  Pulse: 55  Temp: 98.4 F (36.9 C)  TempSrc: Oral  Height: 5\' 5"  (1.651 m)  Weight: 122 lb (55.339 kg)  SpO2: 97%    Physical Exam  Constitutional: She is oriented to person, place, and time. She appears well-developed and well-nourished. No distress.  HENT:  Head: Normocephalic and atraumatic.  Mouth/Throat: Oropharynx is clear and moist. No oropharyngeal exudate.  Eyes: Conjunctivae are normal. Pupils are equal, round, and reactive to light.  Neck: Normal range of motion. Neck supple.  Cardiovascular: Normal rate, regular rhythm and normal heart sounds.   Pulmonary/Chest: Effort normal and breath sounds normal.  Abdominal: Soft. Bowel sounds are normal.  Musculoskeletal: She exhibits no edema or tenderness.  Neurological: She is alert and oriented to person, place, and time.  Skin: Skin is warm and dry. She is not diaphoretic.  Psychiatric: She has a normal mood and affect.    Labs reviewed: Basic Metabolic Panel:  Recent Labs  12/05/13 1636 03/28/14 0929 08/10/14 1338  NA 143 144 140  K 3.6 3.9 4.4  CL 106 102 99  CO2 28 25 26   GLUCOSE 87 97 85  BUN 12 7 13   CREATININE 0.86 0.82 1.08*  CALCIUM 10.8* 11.0* 11.2*  TSH  --  1.250  --    Liver Function Tests:  Recent Labs  12/05/13 1636 03/28/14 0929 08/10/14 1338  AST 21 22 20   ALT 20 20 21   ALKPHOS 93 100 73  BILITOT 0.3 <0.2 <0.2  PROT 6.5 6.7 6.5  ALBUMIN 4.1 4.2 4.4   No results for input(s): LIPASE, AMYLASE in the last 8760 hours. No results for input(s): AMMONIA in the last 8760 hours. CBC:  Recent Labs  12/05/13 1636 03/28/14 0929 05/25/14 1555  WBC 5.7 7.0 3.8*  NEUTROABS  --  5.7  2.1  HGB 11.1* 11.3 11.4*  HCT 33.3* 34.4 34.9*  MCV 92.2 94 93.1  PLT 230 307 194   Lipid Panel:  Recent Labs  11/08/14 1647  CHOL 193  HDL 80  LDLCALC 100  TRIG 64  CHOLHDL 2.4   TSH:  Recent Labs  03/28/14 0929  TSH 1.250   A1C: Lab Results  Component Value Date   HGBA1C 5.9* 08/10/2014     Assessment/Plan  1. Essential hypertension, benign -stable on benicar  - Comprehensive metabolic panel - CBC with Differential  2. Human immunodeficiency virus (HIV) disease ([place]) conts to  follow up with ID, conts on odefsey -would like referral to GYN, will place at this time  3. Disorder of bone and cartilage Taking fosamax 70 mg weekly   4. S/P bunionectomy Doing well post op, no ongoing pain Following with podiatry  5. Hyperlipidemia -LDL at 100, will cont to monitor  -conts on Pravachol and lovaza  6. Arthritis -would like our office to take over tramadol prescription  -okay for this but just got refill from ID -will decrease to 1/2-1 tablet every 12 hours as needed for pain  7. Abdominal pain, chronic, right lower quadrant -pt with long standing abdominal pain, has been to the ED with imagine done in the past without acute abnormal findings.  - Ambulatory referral to Gastroenterology    Carlos American. Harle Battiest  [facility] [phone] 8 am - 5 pm) [phone] (after hours)

## 2014-11-21 NOTE — Patient Instructions (Addendum)
Will send you gastroenterology for further evaluation of long standing abdominal pain.    To decrease tramadol to 1/2-1 tablet twice daily as needed for pain  Will get lab work today  Follow up in 6 months

## 2014-11-22 LAB — CBC WITH DIFFERENTIAL/PLATELET
BASOS ABS: 0 10*3/uL (ref 0.0–0.2)
BASOS: 1 %
EOS (ABSOLUTE): 0 10*3/uL (ref 0.0–0.4)
EOS: 2 %
HEMATOCRIT: 37.3 % (ref 34.0–46.6)
HEMOGLOBIN: 12.1 g/dL (ref 11.1–15.9)
Immature Grans (Abs): 0 10*3/uL (ref 0.0–0.1)
Immature Granulocytes: 0 %
LYMPHS ABS: 1.1 10*3/uL (ref 0.7–3.1)
Lymphs: 43 %
MCH: 29.9 pg (ref 26.6–33.0)
MCHC: 32.4 g/dL (ref 31.5–35.7)
MCV: 92 fL (ref 79–97)
MONOCYTES: 10 %
Monocytes Absolute: 0.2 10*3/uL (ref 0.1–0.9)
NEUTROS ABS: 1.1 10*3/uL — AB (ref 1.4–7.0)
Neutrophils: 44 %
Platelets: 208 10*3/uL (ref 150–379)
RBC: 4.05 x10E6/uL (ref 3.77–5.28)
RDW: 13 % (ref 12.3–15.4)
WBC: 2.5 10*3/uL — CL (ref 3.4–10.8)

## 2014-11-22 LAB — COMPREHENSIVE METABOLIC PANEL
ALBUMIN: 4.3 g/dL (ref 3.5–5.5)
ALT: 10 IU/L (ref 0–32)
AST: 18 IU/L (ref 0–40)
Albumin/Globulin Ratio: 1.8 (ref 1.1–2.5)
Alkaline Phosphatase: 58 IU/L (ref 39–117)
BUN / CREAT RATIO: 15 (ref 9–23)
BUN: 13 mg/dL (ref 6–24)
CHLORIDE: 101 mmol/L (ref 97–106)
CO2: 25 mmol/L (ref 18–29)
Calcium: 10.9 mg/dL — ABNORMAL HIGH (ref 8.7–10.2)
Creatinine, Ser: 0.85 mg/dL (ref 0.57–1.00)
GFR calc non Af Amer: 75 mL/min/{1.73_m2} (ref >59)
GFR, EST AFRICAN AMERICAN: 87 mL/min/{1.73_m2} (ref >59)
GLUCOSE: 81 mg/dL (ref 65–99)
Globulin, Total: 2.4 g/dL (ref 1.5–4.5)
Potassium: 4.2 mmol/L (ref 3.5–5.2)
Sodium: 142 mmol/L (ref 136–144)
TOTAL PROTEIN: 6.7 g/dL (ref 6.0–8.5)

## 2014-11-27 ENCOUNTER — Encounter: Payer: Self-pay | Admitting: Physician Assistant

## 2014-11-27 ENCOUNTER — Encounter: Payer: Commercial Managed Care - HMO | Admitting: Podiatry

## 2014-11-27 ENCOUNTER — Telehealth: Payer: Self-pay

## 2014-11-27 ENCOUNTER — Ambulatory Visit (INDEPENDENT_AMBULATORY_CARE_PROVIDER_SITE_OTHER): Payer: Commercial Managed Care - HMO | Admitting: Physician Assistant

## 2014-11-27 ENCOUNTER — Other Ambulatory Visit (INDEPENDENT_AMBULATORY_CARE_PROVIDER_SITE_OTHER): Payer: Commercial Managed Care - HMO

## 2014-11-27 VITALS — BP 126/76 | HR 64 | Ht 64.0 in | Wt 123.0 lb

## 2014-11-27 DIAGNOSIS — G8929 Other chronic pain: Secondary | ICD-10-CM

## 2014-11-27 DIAGNOSIS — Z1211 Encounter for screening for malignant neoplasm of colon: Secondary | ICD-10-CM | POA: Diagnosis not present

## 2014-11-27 DIAGNOSIS — R1031 Right lower quadrant pain: Secondary | ICD-10-CM

## 2014-11-27 LAB — BASIC METABOLIC PANEL
BUN: 14 mg/dL (ref 6–23)
CHLORIDE: 108 meq/L (ref 96–112)
CO2: 29 meq/L (ref 19–32)
Calcium: 11.1 mg/dL — ABNORMAL HIGH (ref 8.4–10.5)
Creatinine, Ser: 0.89 mg/dL (ref 0.40–1.20)
GFR: 83.44 mL/min (ref >60.00)
GLUCOSE: 91 mg/dL (ref 70–99)
POTASSIUM: 4.8 meq/L (ref 3.5–5.1)
SODIUM: 142 meq/L (ref 135–145)

## 2014-11-27 MED ORDER — NA SULFATE-K SULFATE-MG SULF 17.5-3.13-1.6 GM/177ML PO SOLN
1.0000 | Freq: Once | ORAL | Status: AC
Start: 2014-11-27 — End: 2014-12-27

## 2014-11-27 NOTE — Telephone Encounter (Signed)
Claudia informed.

## 2014-11-27 NOTE — Patient Instructions (Addendum)
Please go to the basement level to have your labs drawn.  We sent a prescription for the colonoscopy prep to [facility] [address]   You have been scheduled for a colonoscopy. Please follow written instructions given to you at your visit today.  Please pick up your prep supplies at the pharmacy [facility] [address] If you use inhalers (even only as needed), please bring them with you on the day of your procedure. Your physician has requested that you go to www.startemmi.com and enter the access code given to you at your visit today. This web site gives a general overview about your procedure. However, you should still follow specific instructions given to you by our office regarding your preparation for the procedure.   You have been scheduled for a CT scan of the abdomen and pelvis at [facility] ([address]---this is in the same building as [facility]).   You are scheduled on 12-06-2014 at 1:30 PM . You should arrive at 1:15 PM  prior to your appointment time for registration. Please follow the written instructions below on the day of your exam:  WARNING: IF YOU ARE ALLERGIC TO IODINE/X-RAY DYE, PLEASE NOTIFY RADIOLOGY IMMEDIATELY AT [phone]! YOU WILL BE GIVEN A 13 HOUR PREMEDICATION PREP.  1) Do not eat or drink anything after 9:30 am  (4 hours prior to your test) 2) You have been given 2 bottles of oral contrast to drink. The solution may taste better if refrigerated, but do NOT add ice or any other liquid to this solution. Shake well before drinking.    Drink 1 bottle of contrast @ 1:30 PM  (2 hours prior to your exam)  Drink 1 bottle of contrast @ 2:30 PM (1 hour prior to your exam)  You may take any medications as prescribed with a small amount of water except for the following: Metformin, Glucophage, Glucovance, Avandamet, Riomet, Fortamet, Actoplus Met, Janumet, Glumetza or Metaglip. The above medications must be held the day of the exam AND 48 hours after the  exam.  The purpose of you drinking the oral contrast is to aid in the visualization of your intestinal tract. The contrast solution may cause some diarrhea. Before your exam is started, you will be given a small amount of fluid to drink. Depending on your individual set of symptoms, you may also receive an intravenous injection of x-ray contrast/dye. Plan on being at [facility] for 30 minutes or long, depending on the type of exam you are having performed.  This test typically takes 30-45 minutes to complete.  If you have any questions regarding your exam or if you need to reschedule, you may call the CT department at [phone] between the hours of 8:00 am and 5:00 pm, Monday-Friday.  ________________________________________________________________________  

## 2014-11-27 NOTE — Telephone Encounter (Signed)
She needs to see a gastroenterologist. Preferably if she has had one who did her colonoscopy not [place].

## 2014-11-27 NOTE — Telephone Encounter (Signed)
She needs to see a gastroenterologist. Preferably if she has had one who did her coloniscopy

## 2014-11-27 NOTE — Progress Notes (Signed)
Reviewed and agree with management plan.  Demarqus Jocson T. Audryanna Zurita, MD FACG 

## 2014-11-27 NOTE — Progress Notes (Addendum)
Patient ID: Laurie Gray, female   DOB: December 23, 1955, 59 y.o.   MRN: [id number]   Subjective:    Patient ID: Laurie Gray, female    DOB: 02-26-1955, 59 y.o.   MRN: [id number]  HPI  [name] is a pleasant 59 year old female, new to GI today referred by [name] NP for evaluation of chronic right lower quadrant pain. Patient has history of depression, HIV for which she is on a trip lot and followed by Dr. Johnnye Sima. She is status post hysterectomy and BSO in 2009 and says she has had right lower quadrant pain off and on for the past few years. She recalls having a colonoscopy about 10 years ago and did not remember who had done the procedure until we went through a list of GI physicians and believes it was Dr. Benson Norway. She may have had a couple of polyps. Patient states her right lower quadrant pain is dull and stick, and go and does not seem to be related to eating or bowel movements. She does not think that constipation necessarily exacerbates the pain either. She does occasionally have problems with gas and bloating otherwise bowel movements fairly regular no melena or hematochezia. She had recently noted some exacerbation of discomfort after intercourse. Patient also mentions a internal hemorrhoid that bothers her off and on but has not been bleeding. She says sometimes it prolapses and has to be pushed back in. She has no family history of colon cancer though does have strong family history of other cancers. No imaging over the past few years . Had CT scan from 2012 that was negative.  Review of Systems Pertinent positive and negative review of systems were noted in the above HPI section.  All other review of systems was otherwise negative.  Outpatient Encounter Prescriptions as of 11/27/2014  Medication Sig  . alendronate (FOSAMAX) 70 MG tablet Take 1 tablet (70 mg total) by mouth once a week. Take with a full glass of water on an empty stomach.  . BENICAR HCT 20-12.5 MG tablet TAKE 1 TABLET  BY MOUTH EVERY DAY  . cyclobenzaprine (FLEXERIL) 5 MG tablet Take one po PRN  . emtricitabine-rilpivir-tenofovir AF (ODEFSEY) 200-25-25 MG TABS per tablet Take 1 tablet by mouth daily with breakfast.  . fluticasone (FLONASE) 50 MCG/ACT nasal spray Place 1 spray into both nostrils as needed for rhinitis or allergies.  Marland Kitchen omega-3 acid ethyl esters (LOVAZA) 1 G capsule TAKE 1 CAPSULE EVERY DAY  . pravastatin (PRAVACHOL) 40 MG tablet TAKE 1 TABLET (40 MG TOTAL) BY MOUTH DAILY.  Marland Kitchen promethazine (PHENERGAN) 25 MG tablet Take 25 mg by mouth every 8 (eight) hours as needed for nausea or vomiting.  . Na Sulfate-K Sulfate-Mg Sulf SOLN Take 1 kit by mouth once.  . traMADol (ULTRAM) 50 MG tablet Take 0.5-1 tablets (25-50 mg total) by mouth every 12 (twelve) hours as needed.   No facility-administered encounter medications on file as of 11/27/2014.   Allergies  Allergen Reactions  . Sulfonamide Derivatives Swelling   Patient Active Problem List   Diagnosis Date Noted  . S/P bunionectomy 11/08/2014  . Arthritis 07/07/2014  . Osteopenia 04/19/2014  . Routine general medical examination at a health care facility 03/28/2014  . Depression 11/02/2013  . Hyperlipidemia 11/02/2013  . Vaginal dysplasia 04/28/2013  . Menopause 04/06/2013  . Vaginal atrophy 04/06/2013  . Right hip pain 08/23/2012  . Pain, dental 04/07/2012  . Vaginal bleeding 02/17/2012  . 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 Headache 12/01/2011  .  Pharyngitis 07/21/2011  . Cellulitis 07/03/2010  . 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 Disorder of bone and cartilage 09/27/2009  . HAND PAIN, RIGHT 09/24/2009  . Acute URI 01/08/2009  . ESSENTIAL HYPERTENSION, BENIGN 07/27/2008  . BREAST MASS 07/03/2008  . Human immunodeficiency virus (HIV) disease ([place]) 04/03/2008  . HSV 04/03/2008  . PITUITARY ADENOMA, BENIGN 04/03/2008   Social History   Social History  . Marital Status: Widowed    Spouse Name: N/A  . Number of Children: N/A  .  Years of Education: N/A   Occupational History  . Not on file.   Social History Main Topics  . Smoking status: Never Smoker   . Smokeless tobacco: Never Used  . Alcohol Use: 0.0 oz/week    0 Standard drinks or equivalent per week     Comment: rarely  . Drug Use: No  . Sexual Activity:    Partners: Male     Comment: pt. given condoms. 1st intercourse- 16, partners- greater than 5    Other Topics Concern  . Not on file   Social History Narrative   Normal diet   Drinks Caffeine- coffee/coke/chocolate    Lives in an apartment/ third level/1 person/1 pet   Past profession- Museum/gallery exhibitions officer, cosmetologist   Exercises 3-4 x weekly (cardio and weights)        Ms. Apsey family history includes Breast cancer in her mother; Cancer in her mother; Hyperlipidemia in her mother; Hypertension in her mother; Stroke in her mother.      Objective:    Filed Vitals:   11/27/14 1030  BP: 126/76  Pulse: 64    Physical Exam  well-developed thin African-American female in no acute distress, pleasant blood pressure 126/76 pulse 64 height 5 foot 4 weight 123. HEENT; nontraumatic normocephalic EOMI PERRLA sclera anicteric, Cardiovascular; regular rate and rhythm with S1-S2 no murmur or gallop, Pulmonary; clear bilaterally, Abdomen ;soft quadrant there is no guarding or rebound no palpable mass or hepatosplenomegaly bowel sounds are present, Rectal; exam not done, Extremities; no clubbing cyanosis or edema skin warm and dry, Neuropsych ;mood and affect appropriate       Assessment & Plan:   #1 59 yo female with chronic RLQ pain, intermittent- etiology not clear-suspect may be secondary to [employer] M-S component R/O occult colon lesion #2 colon neoplasia screening- last colon 10 years ago-?polyps-will obtain records from Dr Benson Norway #3 HIV- doing well on Atripla #4 HTN  Plan; Schedule for Ct abd/pelvis with contrast  CBC, BMET today  Schedule for Colonoscopy with Dr Fuller Plan- procedure discussed  in detail with pt and she is agreeable to proceed.  Addendum- records received from Dr Benson Norway- Colonoscopy 08/2008-one small polyp decending colon , small int hemorrhoids- Path ;benign lymphoid aggregate   Rochele Lueck S Jwan Hornbaker PA-C 11/27/2014   Cc: Lauree Chandler, NP

## 2014-11-27 NOTE — Telephone Encounter (Signed)
Claudia received a referral in her work queue from [employer] at [employer]. They are referring patient back to [place] because of recent positive Hepatitis B diagnosis.  Rosemarie Ax was asking me is it appropriate for her to schedule an appointment for the patient for that or what did you recommend?

## 2014-11-28 LAB — CBC WITH DIFFERENTIAL/PLATELET
BASOS PCT: 0.4 % (ref 0.0–3.0)
Basophils Absolute: 0 10*3/uL (ref 0.0–0.1)
EOS PCT: 1.1 % (ref 0.0–5.0)
Eosinophils Absolute: 0 10*3/uL (ref 0.0–0.7)
HEMATOCRIT: 38.2 % (ref 36.0–46.0)
HEMOGLOBIN: 12.4 g/dL (ref 12.0–15.0)
LYMPHS PCT: 51.3 % — AB (ref 12.0–46.0)
Lymphs Abs: 1.8 10*3/uL (ref 0.7–4.0)
MCHC: 32.4 g/dL (ref 30.0–36.0)
MCV: 93.4 fl (ref 78.0–100.0)
MONOS PCT: 6.8 % (ref 3.0–12.0)
Monocytes Absolute: 0.2 10*3/uL (ref 0.1–1.0)
NEUTROS ABS: 1.4 10*3/uL (ref 1.4–7.7)
Neutrophils Relative %: 40.4 % — ABNORMAL LOW (ref 43.0–77.0)
PLATELETS: 172 10*3/uL (ref 150.0–400.0)
RBC: 4.09 Mil/uL (ref 3.87–5.11)
RDW: 13 % (ref 11.5–15.5)
WBC: 3.5 10*3/uL — AB (ref 4.0–10.5)

## 2014-12-05 ENCOUNTER — Encounter: Payer: Commercial Managed Care - HMO | Admitting: Podiatry

## 2014-12-06 ENCOUNTER — Ambulatory Visit: Payer: Commercial Managed Care - HMO | Admitting: Women's Health

## 2014-12-06 ENCOUNTER — Ambulatory Visit (INDEPENDENT_AMBULATORY_CARE_PROVIDER_SITE_OTHER)
Admission: RE | Admit: 2014-12-06 | Discharge: 2014-12-06 | Disposition: A | Discharge status: Home / Self Care | Payer: Commercial Managed Care - HMO | Source: Ambulatory Visit | Attending: Physician Assistant | Admitting: Physician Assistant

## 2014-12-06 DIAGNOSIS — R1031 Right lower quadrant pain: Secondary | ICD-10-CM | POA: Diagnosis not present

## 2014-12-06 DIAGNOSIS — G8929 Other chronic pain: Secondary | ICD-10-CM

## 2014-12-06 MED ORDER — IOHEXOL 300 MG/ML  SOLN
100.0000 mL | Freq: Once | INTRAMUSCULAR | Status: AC | PRN
  Administered 2014-12-06: 100 mL via INTRAVENOUS

## 2014-12-15 ENCOUNTER — Ambulatory Visit (INDEPENDENT_AMBULATORY_CARE_PROVIDER_SITE_OTHER): Payer: Commercial Managed Care - HMO

## 2014-12-15 ENCOUNTER — Ambulatory Visit (INDEPENDENT_AMBULATORY_CARE_PROVIDER_SITE_OTHER): Payer: Commercial Managed Care - HMO | Admitting: Podiatry

## 2014-12-15 ENCOUNTER — Encounter: Payer: Self-pay | Admitting: Podiatry

## 2014-12-15 VITALS — BP 139/80 | HR 70 | Resp 18

## 2014-12-15 DIAGNOSIS — M205X1 Other deformities of toe(s) (acquired), right foot: Secondary | ICD-10-CM | POA: Diagnosis not present

## 2014-12-15 DIAGNOSIS — Z9889 Other specified postprocedural states: Secondary | ICD-10-CM

## 2014-12-15 MED ORDER — DICLOFENAC SODIUM 1 % TD GEL: 2.0000 g | Freq: Four times a day (QID) | TRANSDERMAL | Status: DC

## 2014-12-20 ENCOUNTER — Other Ambulatory Visit: Payer: Self-pay | Admitting: Nurse Practitioner

## 2014-12-20 ENCOUNTER — Other Ambulatory Visit: Payer: Self-pay | Admitting: Infectious Diseases

## 2014-12-20 ENCOUNTER — Telehealth: Payer: Self-pay | Admitting: *Deleted

## 2014-12-20 NOTE — Telephone Encounter (Signed)
Patient called regarding a refill on her tramadol and Zoloft, I informed her that we didn't have Zoloft on her medication list . She stated that she talked with Janett Billow about this medication and at the time she didn't want to take but now she does want it. Please Advise!

## 2014-12-21 ENCOUNTER — Other Ambulatory Visit: Payer: Self-pay | Admitting: Infectious Diseases

## 2014-12-21 DIAGNOSIS — E785 Hyperlipidemia, unspecified: Secondary | ICD-10-CM

## 2014-12-21 MED ORDER — SERTRALINE HCL 50 MG PO TABS: ORAL_TABLET | ORAL | Status: DC

## 2014-12-21 NOTE — Telephone Encounter (Signed)
LMOM for patient to return call to the office, so I can inform her that medication has been ordered.

## 2014-12-21 NOTE — Progress Notes (Signed)
Patient ID: Laurie Gray, female   DOB: 09-25-1955, 59 y.o.   MRN: [id number]  DOS: 10/04/14 s/p right tailors bunionectomy/exostectomy  Subjective: 59 year old female presents the office today status post right foot surgery. She states she is doing "good". Since last appointment she has been able to wear a regular shoe without any discomfort. No recent injury or trauma. No other complaints at this time. No acute changes his last appointment. She denies any systemic complaints such as fevers, chills, nausea, vomiting. No calf pain, chest pain, shortness of breath.  Objective: AAO 3, NAD Neurovascular status intact and unchanged There is an incision on the dorsal lateral aspect of the right foot overlying the fifth metatarsal head which appears to be well coapted and a scar is formed. There is trace edema around the surgical site without any associated erythema or increased warmth. There is no tenderness palpation of the surgical site.  Hammertoe is present and left second toe.No areas of tenderness bilateral lower extremities. No other open lesions or pre-ulcerative lesions. There is no pain with calf compression, swelling, warmth, erythema.  Assessment: 58 year old female  status post right tailors bunionectomy/exostectomy, doing well.   Plan: -Treatment options discussed including all alternatives, risks, and complications -X-rays were obtained and reviewed with the patient.  -Continue with regular shoe gear as tolerated. -Ice and elevation as needed. Ankle compression stocking as needed. -She would like to hold off on the left 2nd toe surgery or treatment for the left second toe at this time. -Monitor for any clinical signs or symptoms of infection and directed to call the office immediately should any occur or go to the ER. -Follow-up as needed or sooner if any problems arise. In the meantime, encouraged to call the office with any questions, concerns, change in symptoms.   Celesta Gentile, DPM

## 2014-12-21 NOTE — Telephone Encounter (Signed)
Rx sent to pharmacy for zoloft, okay to refill tramadol, needs follow up in 4 weeks

## 2015-01-10 ENCOUNTER — Encounter: Payer: Self-pay | Admitting: Gastroenterology

## 2015-01-10 ENCOUNTER — Ambulatory Visit (AMBULATORY_SURGERY_CENTER): Payer: Commercial Managed Care - HMO | Admitting: Gastroenterology

## 2015-01-10 VITALS — BP 123/73 | HR 48 | Temp 98.2°F | Resp 14 | Ht 64.0 in | Wt 123.0 lb

## 2015-01-10 DIAGNOSIS — R1031 Right lower quadrant pain: Secondary | ICD-10-CM | POA: Diagnosis present

## 2015-01-10 DIAGNOSIS — G8929 Other chronic pain: Secondary | ICD-10-CM | POA: Diagnosis not present

## 2015-01-10 DIAGNOSIS — I10 Essential (primary) hypertension: Secondary | ICD-10-CM | POA: Diagnosis not present

## 2015-01-10 DIAGNOSIS — F329 Major depressive disorder, single episode, unspecified: Secondary | ICD-10-CM | POA: Diagnosis not present

## 2015-01-10 DIAGNOSIS — K5900 Constipation, unspecified: Secondary | ICD-10-CM | POA: Diagnosis not present

## 2015-01-10 NOTE — Progress Notes (Signed)
Report to PACU, RN, vss, BBS= Clear.  

## 2015-01-10 NOTE — Op Note (Signed)
[facility]  [address]. [place], [id number]   COLONOSCOPY PROCEDURE REPORT  PATIENT: Laurie Gray, Laurie Gray  MR#: [id number] BIRTHDATE: 1955/05/12 , 9  yrs. old GENDER: female ENDOSCOPIST: Ladene Artist, MD, [facility] REFERRED BY: [name], NP PROCEDURE DATE:  01/10/2015 PROCEDURE:   Colonoscopy, diagnostic First Screening Colonoscopy - Avg.  risk and is 50 yrs.  old or older - No.  Prior Negative Screening - Now for repeat screening. N/A  History of Adenoma - Now for follow-up colonoscopy & has been > or = to 3 yrs.  N/A  Polyps removed today? No Recommend repeat exam, <10 yrs? No ASA CLASS:   Class III INDICATIONS:abdominal pain in the lower right quadrant. MEDICATIONS: Monitored anesthesia care and Propofol 200 mg IV DESCRIPTION OF PROCEDURE:   After the risks benefits and alternatives of the procedure were thoroughly explained, informed consent was obtained.  The digital rectal exam revealed no abnormalities of the rectum.   The     endoscope was introduced through the anus and advanced to the terminal ileum which was intubated for a short distance. No adverse events experienced. The quality of the prep was excellent.  (Suprep was used)  The instrument was then slowly withdrawn as the colon was fully examined. Estimated blood loss is zero unless otherwise noted in this procedure report.    COLON FINDINGS: A normal appearing cecum, ileocecal valve, and appendiceal orifice were identified.  The ascending, transverse, descending, sigmoid colon, and rectum appeared unremarkable.   The examined, distal terminal ileum appeared to be normal.  Retroflexed views revealed internal Grade I hemorrhoids. The time to cecum = 4.2 Withdrawal time = 8.4   The scope was withdrawn and the procedure completed. COMPLICATIONS: There were no immediate complications.  ENDOSCOPIC IMPRESSION: 1.   Normal colonoscopy 2.   The examined terminal ileum appeared to be normal 3.    Grade I internal hemorrhoids  RECOMMENDATIONS: Continue current colorectal screening recommendations for "routine risk" patients with a repeat colonoscopy in 10 years.  eSigned:  Ladene Artist, MD, [facility] 01/10/2015 3:58 PM

## 2015-01-10 NOTE — Patient Instructions (Signed)
YOU HAD AN ENDOSCOPIC PROCEDURE TODAY AT THE  ENDOSCOPY CENTER:   Refer to the procedure report that was given to you for any specific questions about what was found during the examination.  If the procedure report does not answer your questions, please call your gastroenterologist to clarify.  If you requested that your care partner not be given the details of your procedure findings, then the procedure report has been included in a sealed envelope for you to review at your convenience later.  YOU SHOULD EXPECT: Some feelings of bloating in the abdomen. Passage of more gas than usual.  Walking can help get rid of the air that was put into your GI tract during the procedure and reduce the bloating. If you had a lower endoscopy (such as a colonoscopy or flexible sigmoidoscopy) you may notice spotting of blood in your stool or on the toilet paper. If you underwent a bowel prep for your procedure, you may not have a normal bowel movement for a few days.  Please Note:  You might notice some irritation and congestion in your nose or some drainage.  This is from the oxygen used during your procedure.  There is no need for concern and it should clear up in a day or so.  SYMPTOMS TO REPORT IMMEDIATELY:   Following lower endoscopy (colonoscopy or flexible sigmoidoscopy):  Excessive amounts of blood in the stool  Significant tenderness or worsening of abdominal pains  Swelling of the abdomen that is new, acute  Fever of 100F or higher   For urgent or emergent issues, a gastroenterologist can be reached at any hour by calling [phone].   DIET: Your first meal following the procedure should be a small meal and then it is ok to progress to your normal diet. Heavy or fried foods are harder to digest and may make you feel nauseous or bloated.  Likewise, meals heavy in dairy and vegetables can increase bloating.  Drink plenty of fluids but you should avoid alcoholic beverages for 24  hours.  ACTIVITY:  You should plan to take it easy for the rest of today and you should NOT DRIVE or use heavy machinery until tomorrow (because of the sedation medicines used during the test).    FOLLOW UP: Our staff will call the number listed on your records the next business day following your procedure to check on you and address any questions or concerns that you may have regarding the information given to you following your procedure. If we do not reach you, we will leave a message.  However, if you are feeling well and you are not experiencing any problems, there is no need to return our call.  We will assume that you have returned to your regular daily activities without incident.  If any biopsies were taken you will be contacted by phone or by letter within the next 1-3 weeks.  Please call us at [phone] if you have not heard about the biopsies in 3 weeks.    SIGNATURES/CONFIDENTIALITY: You and/or your care partner have signed paperwork which will be entered into your electronic medical record.  These signatures attest to the fact that that the information above on your After Visit Summary has been reviewed and is understood.  Full responsibility of the confidentiality of this discharge information lies with you and/or your care-partner.    Handouts were given to your care partner on hemorrhoids and a high fiber diet with liberal fluid intake You may resume your   medications today. Please call if any questions or concerns.

## 2015-01-10 NOTE — Progress Notes (Signed)
Patient denies any allergies to eggs or soy. 

## 2015-01-10 NOTE — Progress Notes (Signed)
No problems noted in the recovery room. maw 

## 2015-01-11 ENCOUNTER — Telehealth: Payer: Self-pay | Admitting: *Deleted

## 2015-01-11 NOTE — Telephone Encounter (Signed)
Message left

## 2015-01-16 ENCOUNTER — Other Ambulatory Visit: Payer: Self-pay | Admitting: Nurse Practitioner

## 2015-01-18 ENCOUNTER — Encounter: Payer: Self-pay | Admitting: Nurse Practitioner

## 2015-01-18 ENCOUNTER — Ambulatory Visit (INDEPENDENT_AMBULATORY_CARE_PROVIDER_SITE_OTHER): Payer: Commercial Managed Care - HMO | Admitting: Nurse Practitioner

## 2015-01-18 VITALS — BP 100/78 | HR 66 | Temp 98.3°F | Resp 20 | Ht 64.0 in | Wt 124.8 lb

## 2015-01-18 DIAGNOSIS — M8580 Other specified disorders of bone density and structure, unspecified site: Secondary | ICD-10-CM | POA: Diagnosis not present

## 2015-01-18 DIAGNOSIS — F419 Anxiety disorder, unspecified: Secondary | ICD-10-CM

## 2015-01-18 DIAGNOSIS — F329 Major depressive disorder, single episode, unspecified: Secondary | ICD-10-CM

## 2015-01-18 DIAGNOSIS — F32A Depression, unspecified: Secondary | ICD-10-CM

## 2015-01-18 DIAGNOSIS — E785 Hyperlipidemia, unspecified: Secondary | ICD-10-CM | POA: Diagnosis not present

## 2015-01-18 DIAGNOSIS — I10 Essential (primary) hypertension: Secondary | ICD-10-CM

## 2015-01-18 DIAGNOSIS — M1990 Unspecified osteoarthritis, unspecified site: Secondary | ICD-10-CM | POA: Diagnosis not present

## 2015-01-18 DIAGNOSIS — F418 Other specified anxiety disorders: Secondary | ICD-10-CM | POA: Diagnosis not present

## 2015-01-18 MED ORDER — SERTRALINE HCL 50 MG PO TABS: 50.0000 mg | ORAL_TABLET | Freq: Every day | ORAL | Status: DC

## 2015-01-18 NOTE — Patient Instructions (Signed)
To cont current medications Follow up in 6 months

## 2015-01-18 NOTE — Progress Notes (Signed)
Patient ID: Laurie Gray, female   DOB: 1955/10/10, 60 y.o.   MRN: [id number]    PCP: Lauree Chandler, NP  Advanced Directive information Does patient have an advance directive?: No, Would patient like information on creating an advanced directive?: Yes - Educational materials given  Allergies  Allergen Reactions  . Sulfonamide Derivatives Swelling    Chief Complaint  Patient presents with  . Medical Management of Chronic Issues    3 month follow-up for Hyperlipidemia, Hypertension, Arthritis      HPI: Patient is a 60 y.o. female seen in the office today for routine follow up. Pt with hx of anxiety, HIV, headaches, pain in hands, hyperlipidemia.  Taking tramadol which helps her sleep better at night and helps pain during the day Started on zoloft, tolerating 50 mg daily- no side effects noted  Went to GI due to abdominal pain. Had CT scan and colonoscopy. conts to have pain, possible scar tissue. tramadol helps this pain as well.   Review of Systems:  Review of Systems  Constitutional: Negative for activity change, appetite change, fatigue and unexpected weight change.  HENT: Negative for congestion and hearing loss.   Eyes: Negative.   Respiratory: Negative for cough and shortness of breath.   Cardiovascular: Negative for chest pain, palpitations and leg swelling.  Gastrointestinal: Positive for abdominal pain (chronic). Negative for diarrhea and constipation.  Genitourinary: Negative for dysuria and difficulty urinating.  Musculoskeletal: Positive for arthralgias (hands). Negative for myalgias.  Skin: Negative for color change and wound.  Neurological: Negative for dizziness and weakness.  Psychiatric/Behavioral: Negative for behavioral problems, confusion and agitation. The patient is nervous/anxious (improved on zoloft).     Past Medical History  Diagnosis Date  . HIV infection ([place])   . Hyperlipidemia   . Hypertension   . Pituitary adenoma [facility])    Past  Surgical History  Procedure Laterality Date  . Abdominal hysterectomy    . Tonsillectomy    . Toe surgery    . Tubal ligation     Social History:   reports that she has never smoked. She has never used smokeless tobacco. She reports that she drinks alcohol. She reports that she does not use illicit drugs.  Family History  Problem Relation Age of Onset  . Breast cancer Mother   . Hypertension Mother   . Cancer Mother   . Hyperlipidemia Mother   . Stroke Mother   . Colon cancer Neg Hx     Medications: Patient's Medications  New Prescriptions   No medications on file  Previous Medications   ALENDRONATE (FOSAMAX) 70 MG TABLET    Take 1 tablet (70 mg total) by mouth once a week. Take with a full glass of water on an empty stomach.   EMTRICITABINE-RILPIVIR-TENOFOVIR AF (ODEFSEY) 200-25-25 MG TABS PER TABLET    Take 1 tablet by mouth daily with breakfast.   FLUTICASONE (FLONASE) 50 MCG/ACT NASAL SPRAY    Place 1 spray into both nostrils as needed for rhinitis or allergies. Reported on 01/10/2015   OLMESARTAN-HYDROCHLOROTHIAZIDE (BENICAR HCT) 20-12.5 MG TABLET    TAKE 1 TABLET BY MOUTH EVERY DAY   OMEGA-3 ACID ETHYL ESTERS (LOVAZA) 1 G CAPSULE    TAKE 1 CAPSULE EVERY DAY   PRAVASTATIN (PRAVACHOL) 40 MG TABLET    TAKE 1 TABLET (40 MG TOTAL) BY MOUTH DAILY.   PROMETHAZINE (PHENERGAN) 25 MG TABLET    Take 25 mg by mouth every 8 (eight) hours as needed for nausea or vomiting.  Reported on 01/10/2015   SERTRALINE (ZOLOFT) 50 MG TABLET    TAKE 1/2 TABLET DAILY FOR 2 WEEKS THEN INCREASE TO 1 TABLET FOR ANXIETY   TRAMADOL (ULTRAM) 50 MG TABLET    Take 0.5-1 tablets (25-50 mg total) by mouth every 12 (twelve) hours as needed.  Modified Medications   No medications on file  Discontinued Medications   No medications on file     Physical Exam:  Filed Vitals:   01/18/15 1435  BP: 100/78  Pulse: 66  Temp: 98.3 F (36.8 C)  TempSrc: Oral  Resp: 20  Height: 5\' 4"  (1.626 m)  Weight: 124 lb 12.8  oz (56.609 kg)  SpO2: 97%   Body mass index is 21.41 kg/(m^2).  Physical Exam  Constitutional: She is oriented to person, place, and time. She appears well-developed and well-nourished. No distress.  HENT:  Head: Normocephalic and atraumatic.  Mouth/Throat: Oropharynx is clear and moist. No oropharyngeal exudate.  Eyes: Conjunctivae are normal. Pupils are equal, round, and reactive to light.  Neck: Normal range of motion. Neck supple.  Cardiovascular: Normal rate, regular rhythm and normal heart sounds.   Pulmonary/Chest: Effort normal and breath sounds normal.  Abdominal: Soft. Bowel sounds are normal.  Musculoskeletal: She exhibits no edema or tenderness.  Neurological: She is alert and oriented to person, place, and time.  Skin: Skin is warm and dry. She is not diaphoretic.  Psychiatric: She has a normal mood and affect.    Labs reviewed: Basic Metabolic Panel:  Recent Labs  03/28/14 0929 08/10/14 1338 11/21/14 0907 11/27/14 1134  NA 144 140 142 142  K 3.9 4.4 4.2 4.8  CL 102 99 101 108  CO2 25 26 25 29   GLUCOSE 97 85 81 91  BUN 7 13 13 14   CREATININE 0.82 1.08* 0.85 0.89  CALCIUM 11.0* 11.2* 10.9* 11.1*  TSH 1.250  --   --   --    Liver Function Tests:  Recent Labs  03/28/14 0929 08/10/14 1338 11/21/14 0907  AST 22 20 18   ALT 20 21 10   ALKPHOS 100 73 58  BILITOT <0.2 <0.2 <0.2  PROT 6.7 6.5 6.7  ALBUMIN 4.2 4.4 4.3   No results for input(s): LIPASE, AMYLASE in the last 8760 hours. No results for input(s): AMMONIA in the last 8760 hours. CBC:  Recent Labs  03/28/14 0929 05/25/14 1555 11/21/14 0907 11/27/14 1134  WBC 7.0 3.8* 2.5* 3.5*  NEUTROABS 5.7 2.1 1.1* 1.4  HGB 11.3 11.4*  --  12.4  HCT 34.4 34.9* 37.3 38.2  MCV 94 93.1 92 93.4  PLT 307 194 208 172.0   Lipid Panel:  Recent Labs  11/08/14 1647  CHOL 193  HDL 80  LDLCALC 100  TRIG 64  CHOLHDL 2.4   TSH:  Recent Labs  03/28/14 0929  TSH 1.250   A1C: Lab Results    Component Value Date   HGBA1C 5.9* 08/10/2014     Assessment/Plan 1. Essential hypertension, benign -blood pressure stable, cont current regimen.   2. Osteopenia Remains on fosamax daily   3. Hyperlipidemia Lipids stable in November, LDL of 100, conts on pravastatin  4. Anxiety and depression -improved on zoloft, tolerating dose no side effects noted.  - sertraline (ZOLOFT) 50 MG tablet; Take 1 tablet (50 mg total) by mouth daily.  Dispense: 30 tablet; Refill: 3  5. Arthritis Remains stable, uses tramadol with good effects.   Follow up in 6 months for routine visit.  Carlos American. Harle Battiest  [place] [phone] 8 am - 5 pm) [phone] (after hours)

## 2015-01-19 ENCOUNTER — Other Ambulatory Visit: Payer: Self-pay | Admitting: Nurse Practitioner

## 2015-02-20 ENCOUNTER — Other Ambulatory Visit: Payer: Self-pay

## 2015-02-20 ENCOUNTER — Other Ambulatory Visit: Payer: Self-pay | Admitting: Internal Medicine

## 2015-02-20 MED ORDER — TRAMADOL HCL 50 MG PO TABS: ORAL_TABLET | ORAL | Status: DC

## 2015-02-26 ENCOUNTER — Telehealth: Payer: Self-pay | Admitting: *Deleted

## 2015-02-26 DIAGNOSIS — M21619 Bunion of unspecified foot: Secondary | ICD-10-CM

## 2015-02-26 NOTE — Telephone Encounter (Signed)
Referral placed.

## 2015-02-26 NOTE — Telephone Encounter (Signed)
Okay to place referral

## 2015-02-26 NOTE — Telephone Encounter (Signed)
Patient stated that she needs a referral to Dr. Baldemar Lenis for a second Left Foot Surgery. Please Advise.

## 2015-03-04 ENCOUNTER — Other Ambulatory Visit: Payer: Self-pay | Admitting: Infectious Diseases

## 2015-03-04 DIAGNOSIS — E785 Hyperlipidemia, unspecified: Secondary | ICD-10-CM

## 2015-03-15 ENCOUNTER — Ambulatory Visit
Admission: RE | Admit: 2015-03-15 | Discharge: 2015-03-15 | Disposition: A | Discharge status: Home / Self Care | Payer: Commercial Managed Care - HMO | Source: Ambulatory Visit | Attending: Nurse Practitioner | Admitting: Nurse Practitioner

## 2015-03-15 ENCOUNTER — Encounter: Payer: Self-pay | Admitting: Nurse Practitioner

## 2015-03-15 ENCOUNTER — Ambulatory Visit (INDEPENDENT_AMBULATORY_CARE_PROVIDER_SITE_OTHER): Payer: Commercial Managed Care - HMO | Admitting: Nurse Practitioner

## 2015-03-15 VITALS — BP 84/58 | HR 78 | Temp 98.4°F | Resp 20 | Ht 64.0 in | Wt 119.6 lb

## 2015-03-15 DIAGNOSIS — R05 Cough: Secondary | ICD-10-CM | POA: Diagnosis not present

## 2015-03-15 DIAGNOSIS — R042 Hemoptysis: Secondary | ICD-10-CM | POA: Diagnosis not present

## 2015-03-15 DIAGNOSIS — B20 Human immunodeficiency virus [HIV] disease: Secondary | ICD-10-CM | POA: Diagnosis not present

## 2015-03-15 DIAGNOSIS — J019 Acute sinusitis, unspecified: Secondary | ICD-10-CM

## 2015-03-15 DIAGNOSIS — I10 Essential (primary) hypertension: Secondary | ICD-10-CM | POA: Diagnosis not present

## 2015-03-15 MED ORDER — AMOXICILLIN-POT CLAVULANATE 875-125 MG PO TABS
1.0000 | ORAL_TABLET | Freq: Two times a day (BID) | ORAL | Status: DC
Start: 2015-03-15 — End: 2015-05-09

## 2015-03-15 NOTE — Patient Instructions (Signed)
Will start Augmentin twice daily for 7 days  Increase water intake Attempt to eat despite to appetite or decrease taste  To go get chest xray today  To HOLD blood pressure until you follow up  Take blood pressure daily and record  Follow up in 1 week on blood in sputum and blood pressure

## 2015-03-15 NOTE — Progress Notes (Signed)
Patient ID: Laurie Gray, female   DOB: 07-22-1955, 60 y.o.   MRN: [id number]    PCP: Lauree Chandler, NP  Advanced Directive information Does patient have an advance directive?: No, Would patient like information on creating an advanced directive?: Yes - Educational materials given  Allergies  Allergen Reactions  . Sulfonamide Derivatives Swelling    Chief Complaint  Patient presents with  . Acute Visit    patients c/o has cold with cough and headache for last 2 weeks     HPI: Patient is a 60 y.o. female seen in the office today due to body aches, pains, cough and congestion. Fever 2 days ago, none sense. Coughing up blood and mucous sputum.    Not having shortness of breath, no palpitation.  Feeling dizzy. Taking medication for blood pressure. Blood pressure lower than normal today. Dizziness when she changes positions.  soreness in the chest when she cough, weakness and fatigue. Sore throat.  Been going on for 2 weeks. Took care of grandson 2 weeks ago who was sick with fevers.  Unable to really eat. Trying to keep fluid down- not drinking a lot.  Reports chest congestion but now more in her head. Overall feeling better today then before but still feeling weak.   Review of Systems:  Review of Systems  Constitutional: Negative for activity change, appetite change, fatigue and unexpected weight change.  HENT: Positive for congestion, rhinorrhea, sinus pressure and sore throat. Negative for ear discharge, ear pain, hearing loss, mouth sores, nosebleeds and postnasal drip.   Eyes: Negative.   Respiratory: Positive for cough. Negative for shortness of breath and wheezing.   Cardiovascular: Negative for chest pain and palpitations.  Gastrointestinal: Negative for diarrhea and constipation.  Genitourinary: Negative for dysuria and difficulty urinating.  Musculoskeletal: Negative for myalgias and arthralgias.  Skin: Negative for color change and wound.  Neurological: Positive  for dizziness. Negative for weakness.  Psychiatric/Behavioral: Negative for behavioral problems, confusion and agitation.    Past Medical History  Diagnosis Date  . HIV infection ([place])   . Hyperlipidemia   . Hypertension   . Pituitary adenoma [facility])    Past Surgical History  Procedure Laterality Date  . Abdominal hysterectomy    . Tonsillectomy    . Toe surgery    . Tubal ligation     Social History:   reports that she has never smoked. She has never used smokeless tobacco. She reports that she drinks alcohol. She reports that she does not use illicit drugs.  Family History  Problem Relation Age of Onset  . Breast cancer Mother   . Hypertension Mother   . Cancer Mother   . Hyperlipidemia Mother   . Stroke Mother   . Colon cancer Neg Hx     Medications: Patient's Medications  New Prescriptions   No medications on file  Previous Medications   ALENDRONATE (FOSAMAX) 70 MG TABLET    Take 1 tablet (70 mg total) by mouth once a week. Take with a full glass of water on an empty stomach.   EMTRICITABINE-RILPIVIR-TENOFOVIR AF (ODEFSEY) 200-25-25 MG TABS PER TABLET    Take 1 tablet by mouth daily with breakfast.   FLUTICASONE (FLONASE) 50 MCG/ACT NASAL SPRAY    Place 1 spray into both nostrils as needed for rhinitis or allergies. Reported on 01/10/2015   OLMESARTAN-HYDROCHLOROTHIAZIDE (BENICAR HCT) 20-12.5 MG TABLET    TAKE 1 TABLET BY MOUTH EVERY DAY   OMEGA-3 ACID ETHYL ESTERS (LOVAZA) 1 G CAPSULE  TAKE 1 CAPSULE EVERY DAY   PRAVASTATIN (PRAVACHOL) 40 MG TABLET    TAKE 1 TABLET (40 MG TOTAL) BY MOUTH DAILY.   PROMETHAZINE (PHENERGAN) 25 MG TABLET    Take 25 mg by mouth every 8 (eight) hours as needed for nausea or vomiting. Reported on 01/10/2015   SERTRALINE (ZOLOFT) 50 MG TABLET    Take 1 tablet (50 mg total) by mouth daily.   TRAMADOL (ULTRAM) 50 MG TABLET    TAKE 1/2 TO 1 TABLET BY MOUTH EVERY 12 HOURS AS NEEDED  Modified Medications   No medications on file  Discontinued  Medications   No medications on file     Physical Exam:  Filed Vitals:   03/15/15 1319  BP: 84/58  Pulse: 78  Temp: 98.4 F (36.9 C)  TempSrc: Oral  Resp: 20  Height: 5\' 4"  (1.626 m)  Weight: 119 lb 9.6 oz (54.25 kg)  SpO2: 90%   Body mass index is 20.52 kg/(m^2).  Physical Exam  Constitutional: She is oriented to person, place, and time. She appears well-developed and well-nourished. No distress.  HENT:  Head: Normocephalic and atraumatic.  Right Ear: External ear normal.  Left Ear: External ear normal.  Nose: Nose normal.  Mouth/Throat: Oropharynx is clear and moist. No oropharyngeal exudate.  Eyes: Conjunctivae are normal. Pupils are equal, round, and reactive to light.  Neck: Normal range of motion. Neck supple.  Cardiovascular: Normal rate, regular rhythm and normal heart sounds.   Pulmonary/Chest: Effort normal and breath sounds normal. No respiratory distress. She has no wheezes. She has no rales. She exhibits no tenderness.  Abdominal: Soft. Bowel sounds are normal.  Neurological: She is alert and oriented to person, place, and time.  Skin: Skin is warm and dry. She is not diaphoretic.  Psychiatric: She has a normal mood and affect.   Labs reviewed: Basic Metabolic Panel:  Recent Labs  03/28/14 0929 08/10/14 1338 11/21/14 0907 11/27/14 1134  NA 144 140 142 142  K 3.9 4.4 4.2 4.8  CL 102 99 101 108  CO2 25 26 25 29   GLUCOSE 97 85 81 91  BUN 7 13 13 14   CREATININE 0.82 1.08* 0.85 0.89  CALCIUM 11.0* 11.2* 10.9* 11.1*  TSH 1.250  --   --   --    Liver Function Tests:  Recent Labs  03/28/14 0929 08/10/14 1338 11/21/14 0907  AST 22 20 18   ALT 20 21 10   ALKPHOS 100 73 58  BILITOT <0.2 <0.2 <0.2  PROT 6.7 6.5 6.7  ALBUMIN 4.2 4.4 4.3   No results for input(s): LIPASE, AMYLASE in the last 8760 hours. No results for input(s): AMMONIA in the last 8760 hours. CBC:  Recent Labs  03/28/14 0929 05/25/14 1555 11/21/14 0907 11/27/14 1134  WBC  7.0 3.8* 2.5* 3.5*  NEUTROABS 5.7 2.1 1.1* 1.4  HGB 11.3 11.4*  --  12.4  HCT 34.4 34.9* 37.3 38.2  MCV 94 93.1 92 93.4  PLT 307 194 208 172.0   Lipid Panel:  Recent Labs  11/08/14 1647  CHOL 193  HDL 80  LDLCALC 100  TRIG 64  CHOLHDL 2.4   TSH:  Recent Labs  03/28/14 0929  TSH 1.250   A1C: Lab Results  Component Value Date   HGBA1C 5.9* 08/10/2014     Assessment/Plan 1. Acute sinusitis, recurrence not specified, unspecified location -influenza screening neg -encouraged hydration and proper nutrition -may use tylenol as needed - DG Chest 2 View - CBC with Differential -  Basic metabolic panel - amoxicillin-clavulanate (AUGMENTIN) 875-125 MG tablet; Take 1 tablet by mouth 2 (two) times daily.  Dispense: 20 tablet; Refill: 0  2. Human immunodeficiency virus (HIV) disease ([place]) -compliance with medication -will get chest xray to r/o pneumonia   3. Hemoptysis -will treat for sinusitis and have her use nasal saline  - DG Chest 2 View r/o pneumonia  - CBC with Differential - Basic metabolic panel  4. Essential hypertension Blood pressure low, to hold blood pressure medication for now -take blood pressure daily and record -increase fluid intake  Return precautions discussed with pt.  Pt to follow up in 1 week  Marlee Trentman K. Harle Battiest  [facility] [phone] 8 am - 5 pm) [phone] (after hours)

## 2015-03-16 LAB — CBC WITH DIFFERENTIAL/PLATELET
BASOS: 0 %
Basophils Absolute: 0 10*3/uL (ref 0.0–0.2)
EOS (ABSOLUTE): 0 10*3/uL (ref 0.0–0.4)
EOS: 1 %
HEMATOCRIT: 34.6 % (ref 34.0–46.6)
HEMOGLOBIN: 11.1 g/dL (ref 11.1–15.9)
IMMATURE GRANS (ABS): 0 10*3/uL (ref 0.0–0.1)
Immature Granulocytes: 0 %
LYMPHS: 20 %
Lymphocytes Absolute: 1.2 10*3/uL (ref 0.7–3.1)
MCH: 29.4 pg (ref 26.6–33.0)
MCHC: 32.1 g/dL (ref 31.5–35.7)
MCV: 92 fL (ref 79–97)
MONOCYTES: 10 %
Monocytes Absolute: 0.6 10*3/uL (ref 0.1–0.9)
NEUTROS ABS: 4.2 10*3/uL (ref 1.4–7.0)
Neutrophils: 69 %
Platelets: 211 10*3/uL (ref 150–379)
RBC: 3.78 x10E6/uL (ref 3.77–5.28)
RDW: 13.9 % (ref 12.3–15.4)
WBC: 6.1 10*3/uL (ref 3.4–10.8)

## 2015-03-16 LAB — BASIC METABOLIC PANEL
BUN / CREAT RATIO: 14 (ref 9–23)
BUN: 17 mg/dL (ref 6–24)
CO2: 28 mmol/L (ref 18–29)
CREATININE: 1.21 mg/dL — AB (ref 0.57–1.00)
Calcium: 10.8 mg/dL — ABNORMAL HIGH (ref 8.7–10.2)
Chloride: 101 mmol/L (ref 96–106)
GFR, EST AFRICAN AMERICAN: 57 mL/min/{1.73_m2} — AB (ref >59)
GFR, EST NON AFRICAN AMERICAN: 49 mL/min/{1.73_m2} — AB (ref >59)
GLUCOSE: 93 mg/dL (ref 65–99)
Potassium: 3.8 mmol/L (ref 3.5–5.2)
Sodium: 142 mmol/L (ref 134–144)

## 2015-03-21 ENCOUNTER — Other Ambulatory Visit: Payer: Self-pay | Admitting: Nurse Practitioner

## 2015-03-30 ENCOUNTER — Encounter: Payer: Self-pay | Admitting: Podiatry

## 2015-03-30 ENCOUNTER — Ambulatory Visit (INDEPENDENT_AMBULATORY_CARE_PROVIDER_SITE_OTHER): Payer: Commercial Managed Care - HMO | Admitting: Podiatry

## 2015-03-30 VITALS — BP 139/70 | HR 64 | Resp 18

## 2015-03-30 DIAGNOSIS — M2042 Other hammer toe(s) (acquired), left foot: Secondary | ICD-10-CM | POA: Diagnosis not present

## 2015-03-30 NOTE — Patient Instructions (Signed)

## 2015-04-02 DIAGNOSIS — M2040 Other hammer toe(s) (acquired), unspecified foot: Secondary | ICD-10-CM | POA: Insufficient documentation

## 2015-04-02 NOTE — Progress Notes (Signed)
Patient ID: Laurie Gray, female   DOB: [birth year], 60 y.o.   MRN: [id number]  Subjective: 60 year old female presents the office and to discuss surgery for her left third hammertoe deformity. She states his been ongoing for several years and she has tried multiple treatment options including shoe gear modifications and offloading of the any relief of symptoms. At this time should proceed with hammertoe surgery of the left third toe to help decrease her pain and deformity. She gets pain on a continual basis particular shoe gear and pressure to rubs the top of her shoes. Denies any systemic complaints such as fevers, chills, nausea, vomiting. No acute changes since last appointment, and no other complaints at this time.   Objective: AAO x3, NAD DP/PT pulses palpable bilaterally, CRT less than 3 seconds Protective sensation intact with Simms Weinstein monofilament Incision on the right foot status with tailors bunionectomy/exostectomy is well-healed there is no tenderness along this area. There is a semirigid hammertoe contracture of the left third toe. The toe does very somewhat shortened. There is mild erythema on the dorsal aspect of the PIPJ from irritation shoe gear. No other areas of tenderness to bilateral lower extremities. MMT 5/5, ROM WNL. No edema, erythema, increase in warmth to bilateral lower extremities.  No open lesions or pre-ulcerative lesions.  No pain with calf compression, swelling, warmth, erythema  Assessment: Hammertoe left third toe.  Plan: -All treatment options discussed with the patient including all alternatives, risks, complications.  -Previous x-rays are reviewed with the patient. -Discussed both conservative and surgical treatment options. This time she would proceed with surgical intervention. -Discussed left third PIPJ arthrodesis. K wire fixation -The incision placement as well as the postoperative course was discussed with the patient. I discussed risks of  the surgery which include, but not limited to, infection, bleeding, pain, swelling, need for further surgery, delayed or nonhealing, painful or ugly scar, numbness or sensation changes, over/under correction, recurrence, transfer lesions, further deformity, hardware failure, DVT/PE, loss of toe/foot. Patient understands these risks and wishes to proceed with surgery. The surgical consent was reviewed with the patient all 3 pages were signed. No promises or guarantees were given to the outcome of the procedure. All questions were answered to the best of my ability. Before the surgery the patient was encouraged to call the office if there is any further questions. The surgery will be performed at the [facility] on an outpatient basis.  Celesta Gentile, DPM

## 2015-04-23 ENCOUNTER — Encounter: Payer: Commercial Managed Care - HMO | Admitting: Gynecology

## 2015-04-24 ENCOUNTER — Other Ambulatory Visit: Payer: Self-pay | Admitting: Internal Medicine

## 2015-04-25 DIAGNOSIS — E7800 Pure hypercholesterolemia, unspecified: Secondary | ICD-10-CM | POA: Diagnosis not present

## 2015-04-25 DIAGNOSIS — M2042 Other hammer toe(s) (acquired), left foot: Secondary | ICD-10-CM | POA: Diagnosis not present

## 2015-04-26 ENCOUNTER — Telehealth: Payer: Self-pay | Admitting: *Deleted

## 2015-04-26 NOTE — Telephone Encounter (Signed)
Post op courtesy call-Left message instructing pt not to be up more than 5 minutes/hour for the 1st week post-op, RICE instructions and to take pain medication as directed.  Encouraged pt to call with concerns.

## 2015-04-30 ENCOUNTER — Encounter: Payer: Self-pay | Admitting: Podiatry

## 2015-04-30 ENCOUNTER — Ambulatory Visit (INDEPENDENT_AMBULATORY_CARE_PROVIDER_SITE_OTHER): Payer: Commercial Managed Care - HMO

## 2015-04-30 ENCOUNTER — Ambulatory Visit (INDEPENDENT_AMBULATORY_CARE_PROVIDER_SITE_OTHER): Payer: Commercial Managed Care - HMO | Admitting: Podiatry

## 2015-04-30 VITALS — BP 121/61 | HR 74 | Resp 18

## 2015-04-30 DIAGNOSIS — Z9889 Other specified postprocedural states: Secondary | ICD-10-CM

## 2015-04-30 DIAGNOSIS — M2042 Other hammer toe(s) (acquired), left foot: Secondary | ICD-10-CM

## 2015-04-30 MED ORDER — HYDROCODONE-ACETAMINOPHEN 5-325 MG PO TABS: 1.0000 | ORAL_TABLET | Freq: Four times a day (QID) | ORAL | Status: DC | PRN

## 2015-04-30 NOTE — Progress Notes (Signed)
DOS 04/25/2015 Left 3rd Hammer toe repair with wire fixation.  Subjective: AYLINN MEHLE is a 60 y.o. is seen today in office s/p left 3rd hammertoe repair. They state their pain is improved and she is asking for a refill of vicodin. Denies any systemic complaints such as fevers, chills, nausea, vomiting. No calf pain, chest pain, shortness of breath.   Objective: General: No acute distress, AAOx3  DP/PT pulses palpable 2/4, CRT < 3 sec to all digits.  Protective sensation intact. Motor function intact.  Left foot: Incision is well coapted without any evidence of dehiscence and sutures intact. K-wire intact. There is no surrounding erythema, ascending cellulitis, fluctuance, crepitus, malodor, drainage/purulence. There is mild edema around the surgical site. There is mild pain along the surgical site.  No other areas of tenderness to bilateral lower extremities.  No other open lesions or pre-ulcerative lesions.  No pain with calf compression, swelling, warmth, erythema.   Assessment and Plan:  Status post left 3rd HT repair, doing well with no complications   -Treatment options discussed including all alternatives, risks, and complications -Ice/elevation -Pain medication as needed. -Antibiotic ointment was applied followed by dry sterile dressing. Keep the dressing clean, dry, intact. -Monitor for any clinical signs or symptoms of infection and DVT/PE and directed to call the office immediately should any occur or go to the ER. -Follow-up in 1 week for possible suture removal or sooner if any problems arise. In the meantime, encouraged to call the office with any questions, concerns, change in symptoms.   Celesta Gentile, DPM

## 2015-05-09 ENCOUNTER — Ambulatory Visit (INDEPENDENT_AMBULATORY_CARE_PROVIDER_SITE_OTHER): Payer: Commercial Managed Care - HMO | Admitting: Infectious Diseases

## 2015-05-09 VITALS — Temp 98.1°F | Ht 65.0 in | Wt 124.5 lb

## 2015-05-09 DIAGNOSIS — I10 Essential (primary) hypertension: Secondary | ICD-10-CM

## 2015-05-09 DIAGNOSIS — B20 Human immunodeficiency virus [HIV] disease: Secondary | ICD-10-CM | POA: Diagnosis not present

## 2015-05-09 DIAGNOSIS — Z79899 Other long term (current) drug therapy: Secondary | ICD-10-CM

## 2015-05-09 DIAGNOSIS — Z113 Encounter for screening for infections with a predominantly sexual mode of transmission: Secondary | ICD-10-CM

## 2015-05-09 DIAGNOSIS — N893 Dysplasia of vagina, unspecified: Secondary | ICD-10-CM

## 2015-05-09 LAB — CBC WITH DIFFERENTIAL/PLATELET
BASOS PCT: 1 %
Basophils Absolute: 34 cells/uL (ref 0–200)
EOS ABS: 68 {cells}/uL (ref 15–500)
Eosinophils Relative: 2 %
HEMATOCRIT: 37.1 % (ref 35.0–45.0)
HEMOGLOBIN: 12.1 g/dL (ref 11.7–15.5)
LYMPHS ABS: 1292 {cells}/uL (ref 850–3900)
LYMPHS PCT: 38 %
MCH: 30.2 pg (ref 27.0–33.0)
MCHC: 32.6 g/dL (ref 32.0–36.0)
MCV: 92.5 fL (ref 80.0–100.0)
MONO ABS: 170 {cells}/uL — AB (ref 200–950)
MPV: 11.6 fL (ref 7.5–12.5)
Monocytes Relative: 5 %
NEUTROS ABS: 1836 {cells}/uL (ref 1500–7800)
Neutrophils Relative %: 54 %
Platelets: 204 10*3/uL (ref 140–400)
RBC: 4.01 MIL/uL (ref 3.80–5.10)
RDW: 14.2 % (ref 11.0–15.0)
WBC: 3.4 10*3/uL — ABNORMAL LOW (ref 3.8–10.8)

## 2015-05-09 LAB — LIPID PANEL
CHOL/HDL RATIO: 2.8 ratio (ref <=5.0)
CHOLESTEROL: 215 mg/dL — AB (ref 125–200)
HDL: 76 mg/dL (ref >=46)
LDL Cholesterol: 122 mg/dL (ref <130)
Triglycerides: 86 mg/dL (ref <150)
VLDL: 17 mg/dL (ref <30)

## 2015-05-09 LAB — COMPREHENSIVE METABOLIC PANEL
ALK PHOS: 53 U/L (ref 33–130)
ALT: 23 U/L (ref 6–29)
AST: 21 U/L (ref 10–35)
Albumin: 4.4 g/dL (ref 3.6–5.1)
BILIRUBIN TOTAL: 0.4 mg/dL (ref 0.2–1.2)
BUN: 17 mg/dL (ref 7–25)
CALCIUM: 11.1 mg/dL — AB (ref 8.6–10.4)
CO2: 27 mmol/L (ref 20–31)
Chloride: 104 mmol/L (ref 98–110)
Creat: 0.92 mg/dL (ref 0.50–1.05)
Glucose, Bld: 91 mg/dL (ref 65–99)
POTASSIUM: 4.1 mmol/L (ref 3.5–5.3)
Sodium: 140 mmol/L (ref 135–146)
TOTAL PROTEIN: 7 g/dL (ref 6.1–8.1)

## 2015-05-09 NOTE — Progress Notes (Signed)
   Subjective:    Patient ID: Laurie Gray, female    DOB: 06-25-1955, 60 y.o.   MRN: [id number]  HPI 60 yo F with hx of dx 2000 HIV+, depression (she states this was temporary), hyperlipidemia, prev lap hysterecomy-BSO (2009). Previously took D4T cocktail, went to undetectable. Took herself off for 5 years. CD4 went under 200 and was restarted on atripla. Marland Kitchen  Has had f/u with GYN 04-2014 (nl). Has had previous abn on colpo (SIL).  Had normal mammo 03-2014 Had colon 2013.  At her previous visit was changed to [facility] after she had been started on fosamax for low bone mineral density.   Recently weaned off zoloft with help of her PCP.  Her last lab was notable for Cr 1.21 (from 0.89). On benicar for several years.  No problems with ART.  Had surgery on her L foot due to previous 3rd toe fracture. Had evolved to hammer toe.  Has been healing well, no d/c.   Needs labs today.     HIV 1 RNA QUANT (copies/mL)  Date Value  10/11/2014 <20  05/25/2014 <20  12/05/2013 <20   CD4 T CELL ABS (/uL)  Date Value  10/11/2014 640  05/25/2014 580  12/05/2013 390*     Review of Systems  Constitutional: Negative for fever, chills, appetite change and unexpected weight change.  Gastrointestinal: Negative for diarrhea and constipation.  Genitourinary: Negative for difficulty urinating.       Objective:   Physical Exam  Constitutional: She appears well-developed and well-nourished.  HENT:  Mouth/Throat: No oropharyngeal exudate.  Eyes: EOM are normal. Pupils are equal, round, and reactive to light.  Neck: Neck supple.  Cardiovascular: Normal rate, regular rhythm and normal heart sounds.   Pulmonary/Chest: Effort normal and breath sounds normal.  Abdominal: Soft. Bowel sounds are normal. There is no tenderness. There is no rebound.  Musculoskeletal:       Feet:  Lymphadenopathy:    She has no cervical adenopathy.      Assessment & Plan:

## 2015-05-09 NOTE — Assessment & Plan Note (Signed)
She appears to be doing well Will send her to lab today Also check her Cr Offered/refused condoms.

## 2015-05-09 NOTE — Assessment & Plan Note (Signed)
Will f/u with her PCP Also with regard to her mild increase in Cr.

## 2015-05-09 NOTE — Assessment & Plan Note (Signed)
She has f/u appt with her gyn

## 2015-05-10 LAB — T-HELPER CELL (CD4) - (RCID CLINIC ONLY)
CD4 % Helper T Cell: 41 % (ref 33–55)
CD4 T Cell Abs: 560 /uL (ref 400–2700)

## 2015-05-10 LAB — HIV-1 RNA QUANT-NO REFLEX-BLD
HIV 1 RNA Quant: <20 copies/mL (ref <20)
HIV-1 RNA Quant, Log: <1.3 Log copies/mL (ref <1.30)

## 2015-05-10 LAB — RPR

## 2015-05-11 ENCOUNTER — Ambulatory Visit (INDEPENDENT_AMBULATORY_CARE_PROVIDER_SITE_OTHER): Payer: Commercial Managed Care - HMO

## 2015-05-11 ENCOUNTER — Encounter: Payer: Self-pay | Admitting: Podiatry

## 2015-05-11 ENCOUNTER — Ambulatory Visit (INDEPENDENT_AMBULATORY_CARE_PROVIDER_SITE_OTHER): Payer: Commercial Managed Care - HMO | Admitting: Podiatry

## 2015-05-11 ENCOUNTER — Other Ambulatory Visit: Payer: Self-pay | Admitting: Podiatry

## 2015-05-11 ENCOUNTER — Ambulatory Visit: Payer: Self-pay

## 2015-05-11 DIAGNOSIS — Z9889 Other specified postprocedural states: Secondary | ICD-10-CM

## 2015-05-11 DIAGNOSIS — M2042 Other hammer toe(s) (acquired), left foot: Secondary | ICD-10-CM

## 2015-05-13 ENCOUNTER — Other Ambulatory Visit: Payer: Self-pay | Admitting: Internal Medicine

## 2015-05-15 ENCOUNTER — Other Ambulatory Visit: Payer: Self-pay | Admitting: Nurse Practitioner

## 2015-05-16 NOTE — Progress Notes (Signed)
Patient ID: Laurie Gray, female   DOB: October 28, 1955, 60 y.o.   MRN: [id number]  DOS 04/25/2015 Left 3rd Hammer toe repair with wire fixation.  Subjective: GUELDA STANKUS is a 60 y.o. is seen today in office s/p left 3rd hammertoe repair. She states that she hit her toe 2 days ago and the pain has moved in and she can feel the pen. Since hitting her toe she has had increasing pain to the toe. Before that she is doing well. Denies any systemic complaints such as fevers, chills, nausea, vomiting. No calf pain, chest pain, shortness of breath.   Objective: General: No acute distress, AAOx3  DP/PT pulses palpable 2/4, CRT < 3 sec to all digits.  Protective sensation intact. Motor function intact.  Left foot: Incision is well coapted without any evidence of dehiscence and sutures intact. K-wire intact however has moved inwards I can feel the K wire in the dorsal aspect of the toe along the proximal phalanx base.. There is no surrounding erythema, ascending cellulitis, fluctuance, crepitus, malodor, drainage/purulence. There is mild edema around the surgical site. There is mild pain along the surgical site.  No other areas of tenderness to bilateral lower extremities.  No other open lesions or pre-ulcerative lesions.  No pain with calf compression, swelling, warmth, erythema.   Assessment and Plan:  Status post left 3rd HT repair, but migrated hardware due to injury  -Treatment options discussed including all alternatives, risks, and complications -X-rays obtained and reviewed. The K wire has moved inward. However clinically the toe does sit in a rectus position. The K wire was backed out somewhat and is no longer prominent. -Ice/elevation -Pain medication as needed. -Sutures removed today. Antibiotic ointment was applied followed by dry sterile dressing. Keep the dressing clean, dry, intact. -Monitor for any clinical signs or symptoms of infection and DVT/PE and directed to call the office  immediately should any occur or go to the ER. -Follow-up as scheduled or sooner if any problems arise. In the meantime, encouraged to call the office with any questions, concerns, change in symptoms.   Celesta Gentile, DPM

## 2015-05-20 ENCOUNTER — Other Ambulatory Visit: Payer: Self-pay | Admitting: Internal Medicine

## 2015-05-28 ENCOUNTER — Ambulatory Visit (INDEPENDENT_AMBULATORY_CARE_PROVIDER_SITE_OTHER): Payer: Commercial Managed Care - HMO

## 2015-05-28 ENCOUNTER — Ambulatory Visit (INDEPENDENT_AMBULATORY_CARE_PROVIDER_SITE_OTHER): Payer: Commercial Managed Care - HMO | Admitting: Podiatry

## 2015-05-28 DIAGNOSIS — Z9889 Other specified postprocedural states: Secondary | ICD-10-CM

## 2015-05-28 DIAGNOSIS — M2042 Other hammer toe(s) (acquired), left foot: Secondary | ICD-10-CM

## 2015-05-29 DIAGNOSIS — Z9889 Other specified postprocedural states: Secondary | ICD-10-CM | POA: Insufficient documentation

## 2015-05-29 NOTE — Progress Notes (Signed)
Patient ID: Laurie Gray, female   DOB: 12-Apr-1955, 60 y.o.   MRN: [id number]  DOS 04/25/2015 Left 3rd Hammer toe repair with wire fixation.  Subjective: Laurie Gray is a 60 y.o. is seen today in office s/p left 3rd hammertoe repair. Since last appointment she has transition to wearing a regular shoe. She states that she does not want to wear the surgical shoe any further. She is continued to wear a regular shoe with a cut out in her sock for the K wire and she is been working. Some swelling to the toe however no redness or warmth. No drainage. Denies any systemic complaints such as fevers, chills, nausea, vomiting. No calf pain, chest pain, shortness of breath.   Objective: General: No acute distress, AAOx3  DP/PT pulses palpable 2/4, CRT < 3 sec to all digits.  Protective sensation intact. Motor function intact.  Left foot: Incision is well coapted without any evidence of dehiscence and a scar has formed. K-wire intact to the third toe and the K wires not palpable today. There is mild edema to the surgical site without any erythema or increase in warmth. There is no drainage or pus. No other areas of tenderness to bilateral lower extremities.  No other open lesions or pre-ulcerative lesions.  No pain with calf compression, swelling, warmth, erythema.   Assessment and Plan:  Status post left 3rd HT repair,  -Treatment options discussed including all alternatives, risks, and complications -X-rays obtained and reviewed. K wire intact. Clinically the toes in rectus position. -I strongly encouraged the patient to remain in surgical shoe not to wear a sock however she states that she does not want to do this will continue to wear a regular shoe. Discussed with her potential for infection and she understands this. -Continue ice and elevation. Pain medication as needed. -Monitor for any clinical signs or symptoms of infection and DVT/PE and directed to call the office immediately should any  occur or go to the ER. -Follow-up in 2 weeks or sooner if any problems arise. In the meantime, encouraged to call the office with any questions, concerns, change in symptoms.   Celesta Gentile, DPM

## 2015-06-01 ENCOUNTER — Telehealth: Payer: Self-pay | Admitting: *Deleted

## 2015-06-01 ENCOUNTER — Ambulatory Visit (INDEPENDENT_AMBULATORY_CARE_PROVIDER_SITE_OTHER): Payer: Commercial Managed Care - HMO | Admitting: Gynecology

## 2015-06-01 ENCOUNTER — Encounter: Payer: Self-pay | Admitting: Gynecology

## 2015-06-01 VITALS — BP 126/78 | Ht 65.0 in | Wt 128.0 lb

## 2015-06-01 DIAGNOSIS — Z01419 Encounter for gynecological examination (general) (routine) without abnormal findings: Secondary | ICD-10-CM

## 2015-06-01 DIAGNOSIS — Z87411 Personal history of vaginal dysplasia: Secondary | ICD-10-CM

## 2015-06-01 DIAGNOSIS — R87619 Unspecified abnormal cytological findings in specimens from cervix uteri: Secondary | ICD-10-CM | POA: Diagnosis not present

## 2015-06-01 NOTE — Telephone Encounter (Addendum)
Pt states the pin in her toe came out yesterday.  Pt called 2:50pm.  I told pt to put Neosporin bandaid over the tip of the toe, not to put pin back in, and to remain in the surgery shoe, and I would inform Dr. Jacqualyn Posey and call again with orders.  I called pt and told her Dr. Jacqualyn Posey stated that was okay and to follow the instructions given by V. Honor Junes, RN and call with concerns.

## 2015-06-01 NOTE — Progress Notes (Signed)
Laurie Gray 10/30/1955 [id number]   History:    60 y.o.  for annual gyn exam with no complaints today. Patient for many years of complaining of on and off right lower quadrant discomfort during times of intercourse. Review of patient's record indicated that back in 2009. she had laparoscopic bilateral salpingo-oophorectomy. She was on HRT for short time less than a year. She denies any vasomotor symptoms.Patient has spastic dystonia (genetic). Patient was diagnosed with HIV in 2000 and has been followed by Dr. Johnnye Sima infectious disease. Patient recently had normal CD4 counts. Please see medication list in records. She is also being treated for depression and hyperlipidemia. Patient had a benign colon polyp removed in 2009 and states she had a normal colonoscopy in 2016. Review of her Pap smears have demonstrated the following:  2014 normal 2015 low-grade squamous intraepithelial lesion high-risk HPV (subtype 16, 18 and 45 negative) 2016 normal Patient had a bone density study in 2016  Patient was weighing 109 pounds and she is up to 128 pounds  Past medical history,surgical history, family history and social history were all reviewed and documented in the EPIC chart.  Gynecologic History No LMP recorded. Patient has had a hysterectomy. Contraception: status post hysterectomy Last Pap: See above. Results were: See above Last mammogram: 2016. Results were: normal  Obstetric History OB History  Gravida Para Term Preterm AB SAB TAB Ectopic Multiple Living  2 2        2     # Outcome Date GA Lbr Len/2nd Weight Sex Delivery Anes PTL Lv  2 Para           1 Para                ROS: A ROS was performed and pertinent positives and negatives are included in the history.  GENERAL: No fevers or chills. HEENT: No change in vision, no earache, sore throat or sinus congestion. NECK: No pain or stiffness. CARDIOVASCULAR: No chest pain or pressure. No palpitations. PULMONARY: No shortness of  breath, cough or wheeze. GASTROINTESTINAL: No abdominal pain, nausea, vomiting or diarrhea, melena or bright red blood per rectum. GENITOURINARY: No urinary frequency, urgency, hesitancy or dysuria. MUSCULOSKELETAL: No joint or muscle pain, no back pain, no recent trauma. DERMATOLOGIC: No rash, no itching, no lesions. ENDOCRINE: No polyuria, polydipsia, no heat or cold intolerance. No recent change in weight. HEMATOLOGICAL: No anemia or easy bruising or bleeding. NEUROLOGIC: No headache, seizures, numbness, tingling or weakness. PSYCHIATRIC: No depression, no loss of interest in normal activity or change in sleep pattern.     Exam: chaperone present  BP 126/78 mmHg  Ht 5\' 5"  (1.651 m)  Wt 128 lb (58.06 kg)  BMI 21.30 kg/m2  Body mass index is 21.3 kg/(m^2).  General appearance : Well developed well nourished female. No acute distress HEENT: Eyes: no retinal hemorrhage or exudates,  Neck supple, trachea midline, no carotid bruits, no thyroidmegaly Lungs: Clear to auscultation, no rhonchi or wheezes, or rib retractions  Heart: Regular rate and rhythm, no murmurs or gallops Breast:Examined in sitting and supine position were symmetrical in appearance, no palpable masses or tenderness,  no skin retraction, no nipple inversion, no nipple discharge, no skin discoloration, no axillary or supraclavicular lymphadenopathy Abdomen: no palpable masses or tenderness, no rebound or guarding Extremities: no edema or skin discoloration or tenderness  Pelvic:  Bartholin, Urethra, Skene Glands: Within normal limits             Vagina: No  gross lesions or discharge  Cervix: Absent  Uterus  absent  Adnexa  Without masses or tenderness  Anus and perineum  normal   Rectal exam: Not done colonoscopy this year  Assessment/Plan:  60 y.o. female for annual exam with history of HIV diagnosed in the year 2000. Recent CD4 counts were normal. Pap smear done today. PCP doing blood work. Patient was reminded of the  importance of calcium vitamin D and weightbearing exercises for osteoporosis prevention. Patient was reminded also in the importance of monthly self breast examination. Patient's PCP has been doing her blood work as well as.    Terrance Mass MD, 1:02 PM 06/01/2015

## 2015-06-06 LAB — PAP, TP IMAGING W/ HPV RNA, RFLX HPV TYPE 16,18/45: HPV MRNA, HIGH RISK: NOT DETECTED

## 2015-06-11 ENCOUNTER — Ambulatory Visit (INDEPENDENT_AMBULATORY_CARE_PROVIDER_SITE_OTHER): Payer: Commercial Managed Care - HMO | Admitting: Podiatry

## 2015-06-11 ENCOUNTER — Ambulatory Visit (INDEPENDENT_AMBULATORY_CARE_PROVIDER_SITE_OTHER): Payer: Commercial Managed Care - HMO

## 2015-06-11 ENCOUNTER — Encounter: Payer: Self-pay | Admitting: Podiatry

## 2015-06-11 DIAGNOSIS — Z9889 Other specified postprocedural states: Secondary | ICD-10-CM

## 2015-06-11 DIAGNOSIS — M2042 Other hammer toe(s) (acquired), left foot: Secondary | ICD-10-CM | POA: Diagnosis not present

## 2015-06-11 DIAGNOSIS — S90852A Superficial foreign body, left foot, initial encounter: Secondary | ICD-10-CM

## 2015-06-14 NOTE — Progress Notes (Signed)
Patient ID: Laurie Gray, female   DOB: March 17, 1955, 60 y.o.   MRN: [id number]   DOS 04/25/2015 Left 3rd Hammer toe repair with wire fixation.  Subjective: Laurie Gray is a 60 y.o. is seen today in office s/p left 3rd hammertoe repair. Since last appointment a K wire did come out. She's been wearing a regular shoe when she is having no significant pain of the toe. Also this one has improved of the toe. About one week ago she did step on a piece of glass to her left foot and she points to submetatarsal 1 area. She states that she had some pain first her that has almost resolved. She denies any swelling or redness or any drainage.  Denies any systemic complaints such as fevers, chills, nausea, vomiting. No calf pain, chest pain, shortness of breath.   Objective: General: No acute distress, AAOx3  DP/PT pulses palpable 2/4, CRT < 3 sec to all digits.  Protective sensation intact. Motor function intact.  Left foot: Incision is well coapted without any evidence of dehiscence and a scar has formed. K wires not present. The toe in rectus position. There is mild edema to the toe and there is some slight tenderness to toe however this appears to be improved. Pin site hold this closed Left foot submetatarsal one appears to be an area of the puncture sites were glass with that. There is no significant hyperkeratotic tissue. Unable to remove any type of foreign body. There is no drainage or pus. No edema, erythema. No signs of infection. Slight tenderness this area.. No other areas of tenderness to bilateral lower extremities.  No other open lesions or pre-ulcerative lesions.  No pain with calf compression, swelling, warmth, erythema.   Assessment and Plan:  Status post left 3rd HT repair, possible foreign body left foot  -Treatment options discussed including all alternatives, risks, and complications -X-rays obtained and reviewed. Toe rectus position. There is no identified warm by at this  time. -Regards the foreign body unable to appreciate any form by x-ray unable to remove anything today. If symptoms continue median ultrasound. Much for infection to call the office should any occur. -Continue surgical shoe on the left foot however she does not want to wear this. I showed her how to tape the toe to help give it stability. Ice and elevation. -Follow-up as scheduled or sooner if needed.  Celesta Gentile, DPM

## 2015-06-19 ENCOUNTER — Other Ambulatory Visit: Payer: Self-pay | Admitting: Nurse Practitioner

## 2015-06-21 ENCOUNTER — Ambulatory Visit: Payer: Commercial Managed Care - HMO | Admitting: Nurse Practitioner

## 2015-06-25 ENCOUNTER — Ambulatory Visit: Payer: Commercial Managed Care - HMO | Admitting: Nurse Practitioner

## 2015-06-26 ENCOUNTER — Other Ambulatory Visit: Payer: Self-pay | Admitting: Nurse Practitioner

## 2015-07-16 ENCOUNTER — Ambulatory Visit: Payer: Commercial Managed Care - HMO | Admitting: Podiatry

## 2015-07-18 ENCOUNTER — Other Ambulatory Visit: Payer: Self-pay | Admitting: Infectious Diseases

## 2015-07-18 DIAGNOSIS — B20 Human immunodeficiency virus [HIV] disease: Secondary | ICD-10-CM

## 2015-07-19 ENCOUNTER — Ambulatory Visit: Payer: Commercial Managed Care - HMO | Admitting: Nurse Practitioner

## 2015-07-19 ENCOUNTER — Encounter: Payer: Self-pay | Admitting: Nurse Practitioner

## 2015-07-19 DIAGNOSIS — Z0289 Encounter for other administrative examinations: Secondary | ICD-10-CM

## 2015-08-03 ENCOUNTER — Other Ambulatory Visit: Payer: Self-pay | Admitting: Nurse Practitioner

## 2015-08-03 DIAGNOSIS — Z1231 Encounter for screening mammogram for malignant neoplasm of breast: Secondary | ICD-10-CM

## 2015-08-10 ENCOUNTER — Other Ambulatory Visit: Payer: Self-pay | Admitting: Nurse Practitioner

## 2015-08-10 ENCOUNTER — Ambulatory Visit
Admission: RE | Admit: 2015-08-10 | Discharge: 2015-08-10 | Disposition: A | Discharge status: Home / Self Care | Payer: Commercial Managed Care - HMO | Source: Ambulatory Visit | Attending: Nurse Practitioner | Admitting: Nurse Practitioner

## 2015-08-10 DIAGNOSIS — N644 Mastodynia: Secondary | ICD-10-CM

## 2015-08-10 DIAGNOSIS — Z1231 Encounter for screening mammogram for malignant neoplasm of breast: Secondary | ICD-10-CM

## 2015-08-10 DIAGNOSIS — N63 Unspecified lump in unspecified breast: Secondary | ICD-10-CM

## 2015-08-13 ENCOUNTER — Ambulatory Visit
Admission: RE | Admit: 2015-08-13 | Discharge: 2015-08-13 | Disposition: A | Discharge status: Home / Self Care | Payer: Commercial Managed Care - HMO | Source: Ambulatory Visit | Attending: Nurse Practitioner | Admitting: Nurse Practitioner

## 2015-08-13 ENCOUNTER — Other Ambulatory Visit: Payer: Self-pay

## 2015-08-13 ENCOUNTER — Ambulatory Visit (INDEPENDENT_AMBULATORY_CARE_PROVIDER_SITE_OTHER): Payer: Commercial Managed Care - HMO | Admitting: Nurse Practitioner

## 2015-08-13 ENCOUNTER — Encounter: Payer: Self-pay | Admitting: Nurse Practitioner

## 2015-08-13 DIAGNOSIS — M25512 Pain in left shoulder: Secondary | ICD-10-CM

## 2015-08-13 DIAGNOSIS — Z8669 Personal history of other diseases of the nervous system and sense organs: Secondary | ICD-10-CM

## 2015-08-13 DIAGNOSIS — N63 Unspecified lump in unspecified breast: Secondary | ICD-10-CM

## 2015-08-13 DIAGNOSIS — M25511 Pain in right shoulder: Secondary | ICD-10-CM | POA: Diagnosis not present

## 2015-08-13 DIAGNOSIS — F329 Major depressive disorder, single episode, unspecified: Secondary | ICD-10-CM

## 2015-08-13 DIAGNOSIS — F418 Other specified anxiety disorders: Secondary | ICD-10-CM

## 2015-08-13 DIAGNOSIS — F32A Depression, unspecified: Secondary | ICD-10-CM

## 2015-08-13 DIAGNOSIS — M8580 Other specified disorders of bone density and structure, unspecified site: Secondary | ICD-10-CM | POA: Diagnosis not present

## 2015-08-13 DIAGNOSIS — F419 Anxiety disorder, unspecified: Secondary | ICD-10-CM

## 2015-08-13 NOTE — Patient Instructions (Addendum)
To stop fosamax due to side effects To take Vit D 800 units daily Will repeat Dexa next year  Will get xray of right shoulder due to increase pain Cont to use heat and tramadol as needed  Will get diagnostic mammogram

## 2015-08-13 NOTE — Progress Notes (Signed)
PCP: Lauree Chandler, NP  Advanced Directive information Does patient have an advance directive?: No  Allergies  Allergen Reactions  . Sulfonamide Derivatives Swelling    Chief Complaint  Patient presents with  . Medical Management of Chronic Issues    6 month med management.      HPI: Patient is a 60 y.o. female seen in the office today for routine follow up.  Reports she has not been taking fosamax routinely and then noticed she started feeling bad when she would take it, her joints would hurt and feel achy when she did take medication Dexa scan done in 2016 Can not take calcium due to elevated blood calcium level Not taking Vit D supplement.   Noticed lump in her left breast 2 weeks ago.has been having pain in breast. Went to get mammogram and they states they would need a new order since she has found a lump.   Following with ID due to HIV, had PAP done at last appt, going q 6 months .   Quit taking zoloft, did not feel as if she was anxious or depressed   Bilateral shoulder pain, right worse than left, (sister had rotator cuff surgery and this helped her), wondering what she can do to help the pain Has been going on for 2-3 months. Ultram helps the pain at night No injury noted. No weakness in shoulder, arms or hands.   Review of Systems:  Review of Systems  Constitutional: Negative for activity change, appetite change, fatigue and unexpected weight change.  HENT: Negative for congestion and hearing loss.   Eyes: Negative.   Respiratory: Negative for cough and shortness of breath.   Cardiovascular: Negative for chest pain, palpitations and leg swelling.  Gastrointestinal: Negative for abdominal pain, constipation and diarrhea.  Genitourinary: Negative for difficulty urinating and dysuria.  Musculoskeletal: Positive for arthralgias (in bilateral shoulders). Negative for gait problem and myalgias.  Skin: Negative for color change and wound.       Reports tender  lump to left breast- Tenderness for sometime but noticed lump 3 weeks ago  Neurological: Negative for dizziness and weakness.  Psychiatric/Behavioral: Negative for agitation, behavioral problems and confusion. The patient is not nervous/anxious.     Past Medical History:  Diagnosis Date  . HIV infection ([place])   . Hyperlipidemia   . Hypertension   . Pituitary adenoma [facility])    Past Surgical History:  Procedure Laterality Date  . ABDOMINAL HYSTERECTOMY    . HAMMER TOE SURGERY Bilateral    bunionectomy  . TOE SURGERY    . TONSILLECTOMY    . TUBAL LIGATION     Social History:   reports that she has never smoked. She has never used smokeless tobacco. She reports that she drinks alcohol. She reports that she does not use drugs.  Family History  Problem Relation Age of Onset  . Breast cancer Mother   . Hypertension Mother   . Cancer Mother   . Hyperlipidemia Mother   . Stroke Mother   . Colon cancer Neg Hx     Medications: Patient's Medications  New Prescriptions   No medications on file  Previous Medications   ODEFSEY 200-25-25 MG TABS TABLET    TAKE 1 TABLET BY MOUTH DAILY WITH BREAKFAST. (7/5 WAITING FOR PRIOR AUTH)   OLMESARTAN-HYDROCHLOROTHIAZIDE (BENICAR HCT) 20-12.5 MG TABLET    TAKE 1 TABLET BY MOUTH EVERY DAY   OMEGA-3 ACID ETHYL ESTERS (LOVAZA) 1 G CAPSULE    TAKE  1 CAPSULE EVERY DAY   PRAVASTATIN (PRAVACHOL) 40 MG TABLET    TAKE 1 TABLET (40 MG TOTAL) BY MOUTH DAILY.   PROMETHAZINE (PHENERGAN) 12.5 MG TABLET    Take 12.5 mg by mouth every 6 (six) hours as needed for nausea or vomiting. Reported on 05/09/2015   PROMETHAZINE (PHENERGAN) 25 MG TABLET    Take 25 mg by mouth every 8 (eight) hours as needed for nausea or vomiting. Reported on 05/09/2015   TRAMADOL (ULTRAM) 50 MG TABLET    TAKE 1/2 TO 1 TABLET EVERY 12 HOURS AS NEEDED  Modified Medications   No medications on file  Discontinued Medications   ALENDRONATE (FOSAMAX) 70 MG TABLET    TAKE 1 TABLET WEEKLY WITH A  FULL GLASS OF WATER ON AN EMPTY STOMACH   CEPHALEXIN (KEFLEX) 500 MG CAPSULE    Take 500 mg by mouth 3 (three) times daily. Reported on 05/09/2015   FLUTICASONE (FLONASE) 50 MCG/ACT NASAL SPRAY    Place 1 spray into both nostrils as needed for rhinitis or allergies. Reported on 01/10/2015   HYDROCODONE-ACETAMINOPHEN (NORCO/VICODIN) 5-325 MG TABLET    Take 1 tablet by mouth every 6 (six) hours as needed.   HYDROCODONE-ACETAMINOPHEN (NORCO/VICODIN) 5-325 MG TABLET    Take 1-2 tablets by mouth every 6 (six) hours as needed for moderate pain (every 4-6 hours prn foot pain).   SERTRALINE (ZOLOFT) 50 MG TABLET    Take 1 tablet (50 mg total) by mouth daily.     Physical Exam:  Vitals:   08/13/15 1411  BP: 112/72  Pulse: 65  Resp: 17  Temp: 97.9 F (36.6 C)  TempSrc: Oral  SpO2: 97%  Weight: 130 lb 12.8 oz (59.3 kg)  Height: 5\' 5"  (1.651 m)   Body mass index is 21.77 kg/m.  Physical Exam  Constitutional: She is oriented to person, place, and time. She appears well-developed and well-nourished. No distress.  HENT:  Head: Normocephalic and atraumatic.  Mouth/Throat: Oropharynx is clear and moist. No oropharyngeal exudate.  Eyes: Conjunctivae are normal. Pupils are equal, round, and reactive to light.  Neck: Normal range of motion. Neck supple.  Cardiovascular: Normal rate, regular rhythm and normal heart sounds.   Pulmonary/Chest: Effort normal and breath sounds normal.    Abdominal: Soft. Bowel sounds are normal.  Musculoskeletal: She exhibits tenderness (bilateral shoulder). She exhibits no edema.       Right shoulder: She exhibits decreased range of motion and tenderness.       Left shoulder: She exhibits tenderness.  Neurological: She is alert and oriented to person, place, and time.  Skin: Skin is warm and dry. She is not diaphoretic.  Psychiatric: Her mood appears anxious.    Labs reviewed: Basic Metabolic Panel:  Recent Labs  11/27/14 1134 03/15/15 1414 05/09/15 1146    NA 142 142 140  K 4.8 3.8 4.1  CL 108 101 104  CO2 29 28 27   GLUCOSE 91 93 91  BUN 14 17 17   CREATININE 0.89 1.21* 0.92  CALCIUM 11.1* 10.8* 11.1*   Liver Function Tests:  Recent Labs  11/21/14 0907 05/09/15 1146  AST 18 21  ALT 10 23  ALKPHOS 58 53  BILITOT <0.2 0.4  PROT 6.7 7.0  ALBUMIN 4.3 4.4   No results for input(s): LIPASE, AMYLASE in the last 8760 hours. No results for input(s): AMMONIA in the last 8760 hours. CBC:  Recent Labs  11/27/14 1134 03/15/15 1414 05/09/15 1146  WBC 3.5* 6.1 3.4*  NEUTROABS  1.4 4.2 1,836  HGB 12.4  --  12.1  HCT 38.2 34.6 37.1  MCV 93.4 92 92.5  PLT 172.0 211 204   Lipid Panel:  Recent Labs  11/08/14 1647 05/09/15 1146  CHOL 193 215*  HDL 80 76  LDLCALC 100 122  TRIG 64 86  CHOLHDL 2.4 2.8   TSH: No results for input(s): TSH in the last 8760 hours. A1C: Lab Results  Component Value Date   HGBA1C 5.9 (H) 08/10/2014     Assessment/Plan 1. Hypercalcemia -chronic borderline high calcium - PTH, Intact and Calcium - Calcium, ionized -possible referral to endocrine for further evaluation   2. Osteopenia -to stop fosamax, to take vit d 800 units daily   3. Anxiety and depression Stable, not needing medication at this time   4. BREAST MASS -possible scar tissue from prior biopsy however pt reports this is new so will send for further evaluation  - MM Digital Diagnostic Bilat; Future  5. Pain of both shoulder joints - DG Shoulder Right -will send for PT if no significant findings based on xray  Follow up in 3 months, sooner if needed Clelia Trabucco K. Harle Battiest  [facility] [phone] 8 am - 5 pm) [phone] (after hours)

## 2015-08-14 ENCOUNTER — Other Ambulatory Visit: Payer: Self-pay

## 2015-08-14 DIAGNOSIS — M25511 Pain in right shoulder: Secondary | ICD-10-CM

## 2015-08-14 DIAGNOSIS — M25512 Pain in left shoulder: Principal | ICD-10-CM

## 2015-08-14 LAB — PTH, INTACT AND CALCIUM
Calcium: 10.7 mg/dL — ABNORMAL HIGH (ref 8.4–10.5)
PTH: 97 pg/mL — AB (ref 14–64)

## 2015-08-14 LAB — CALCIUM, IONIZED: CALCIUM ION: 6.3 mg/dL — AB (ref 4.8–5.6)

## 2015-08-15 ENCOUNTER — Other Ambulatory Visit: Payer: Self-pay | Admitting: Internal Medicine

## 2015-08-15 ENCOUNTER — Ambulatory Visit
Admission: RE | Admit: 2015-08-15 | Discharge: 2015-08-15 | Disposition: A | Discharge status: Home / Self Care | Payer: Commercial Managed Care - HMO | Source: Ambulatory Visit | Attending: Nurse Practitioner | Admitting: Nurse Practitioner

## 2015-08-15 ENCOUNTER — Telehealth: Payer: Self-pay

## 2015-08-15 DIAGNOSIS — N644 Mastodynia: Secondary | ICD-10-CM | POA: Diagnosis not present

## 2015-08-15 DIAGNOSIS — N63 Unspecified lump in unspecified breast: Secondary | ICD-10-CM

## 2015-08-15 DIAGNOSIS — E215 Disorder of parathyroid gland, unspecified: Secondary | ICD-10-CM

## 2015-08-15 NOTE — Telephone Encounter (Signed)
-----   Message from Lauree Chandler, NP sent at 08/14/2015  2:57 PM EDT ----- Pt with elevated PTH and chronically elevated Calcium Please refer to endocrine for possible hyperparathyroidism

## 2015-08-15 NOTE — Telephone Encounter (Signed)
Spoke with patient, patient verbalized understanding of results. Patient in agreement with Endocrinology referral. Patient did not have a preference, patient has never seen an Endocrinologist before.

## 2015-08-21 ENCOUNTER — Ambulatory Visit: Payer: Commercial Managed Care - HMO | Admitting: Physical Therapy

## 2015-09-26 ENCOUNTER — Other Ambulatory Visit: Payer: Self-pay | Admitting: Infectious Diseases

## 2015-09-26 ENCOUNTER — Other Ambulatory Visit: Payer: Self-pay | Admitting: *Deleted

## 2015-09-26 DIAGNOSIS — E785 Hyperlipidemia, unspecified: Secondary | ICD-10-CM

## 2015-09-26 MED ORDER — PRAVASTATIN SODIUM 40 MG PO TABS: ORAL_TABLET | ORAL | 1 refills | Status: DC

## 2015-09-26 NOTE — Telephone Encounter (Signed)
[facility] [address] 

## 2015-10-05 ENCOUNTER — Other Ambulatory Visit: Payer: Self-pay | Admitting: *Deleted

## 2015-10-05 DIAGNOSIS — I10 Essential (primary) hypertension: Secondary | ICD-10-CM | POA: Diagnosis not present

## 2015-10-05 MED ORDER — OLMESARTAN MEDOXOMIL-HCTZ 20-12.5 MG PO TABS: 1.0000 | ORAL_TABLET | Freq: Every day | ORAL | 1 refills | Status: DC

## 2015-10-05 NOTE — Telephone Encounter (Signed)
[facility] [address] requested 90 day supply

## 2015-10-09 ENCOUNTER — Other Ambulatory Visit: Payer: Self-pay | Admitting: Internal Medicine

## 2015-10-11 ENCOUNTER — Other Ambulatory Visit: Payer: Self-pay | Admitting: Internal Medicine

## 2015-10-11 DIAGNOSIS — E213 Hyperparathyroidism, unspecified: Secondary | ICD-10-CM

## 2015-10-17 ENCOUNTER — Encounter (HOSPITAL_COMMUNITY): Payer: Commercial Managed Care - HMO

## 2015-10-22 ENCOUNTER — Encounter (HOSPITAL_COMMUNITY)
Admission: RE | Admit: 2015-10-22 | Discharge: 2015-10-22 | Disposition: A | Discharge status: Home / Self Care | Payer: Commercial Managed Care - HMO | Source: Ambulatory Visit | Attending: Internal Medicine | Admitting: Internal Medicine

## 2015-10-22 ENCOUNTER — Ambulatory Visit (HOSPITAL_COMMUNITY)
Admission: RE | Admit: 2015-10-22 | Discharge: 2015-10-22 | Disposition: A | Discharge status: Home / Self Care | Payer: Commercial Managed Care - HMO | Source: Ambulatory Visit | Attending: Internal Medicine | Admitting: Internal Medicine

## 2015-10-22 DIAGNOSIS — E042 Nontoxic multinodular goiter: Secondary | ICD-10-CM | POA: Diagnosis not present

## 2015-10-22 DIAGNOSIS — R938 Abnormal findings on diagnostic imaging of other specified body structures: Secondary | ICD-10-CM | POA: Insufficient documentation

## 2015-10-22 DIAGNOSIS — E213 Hyperparathyroidism, unspecified: Secondary | ICD-10-CM

## 2015-10-22 MED ORDER — TECHNETIUM TC 99M SESTAMIBI GENERIC - CARDIOLITE
25.0000 | Freq: Once | INTRAVENOUS | Status: AC | PRN
  Administered 2015-10-22: 25 via INTRAVENOUS

## 2015-11-14 ENCOUNTER — Ambulatory Visit: Payer: Commercial Managed Care - HMO | Admitting: Infectious Diseases

## 2015-11-15 ENCOUNTER — Ambulatory Visit: Payer: Commercial Managed Care - HMO | Admitting: Nurse Practitioner

## 2015-11-19 ENCOUNTER — Ambulatory Visit (INDEPENDENT_AMBULATORY_CARE_PROVIDER_SITE_OTHER): Payer: Commercial Managed Care - HMO | Admitting: Nurse Practitioner

## 2015-11-19 ENCOUNTER — Encounter: Payer: Self-pay | Admitting: Nurse Practitioner

## 2015-11-19 VITALS — BP 122/78 | HR 83 | Temp 98.5°F | Resp 17 | Ht 65.0 in | Wt 134.2 lb

## 2015-11-19 DIAGNOSIS — I10 Essential (primary) hypertension: Secondary | ICD-10-CM

## 2015-11-19 DIAGNOSIS — E785 Hyperlipidemia, unspecified: Secondary | ICD-10-CM

## 2015-11-19 DIAGNOSIS — R739 Hyperglycemia, unspecified: Secondary | ICD-10-CM

## 2015-11-19 DIAGNOSIS — B20 Human immunodeficiency virus [HIV] disease: Secondary | ICD-10-CM | POA: Diagnosis not present

## 2015-11-19 DIAGNOSIS — E213 Hyperparathyroidism, unspecified: Secondary | ICD-10-CM

## 2015-11-19 LAB — CBC WITH DIFFERENTIAL/PLATELET
Basophils Absolute: 37 cells/uL (ref 0–200)
Basophils Relative: 1 %
Eosinophils Absolute: 74 cells/uL (ref 15–500)
Eosinophils Relative: 2 %
HEMATOCRIT: 36.3 % (ref 35.0–45.0)
Hemoglobin: 11.9 g/dL (ref 11.7–15.5)
LYMPHS PCT: 46 %
Lymphs Abs: 1702 cells/uL (ref 850–3900)
MCH: 30.3 pg (ref 27.0–33.0)
MCHC: 32.8 g/dL (ref 32.0–36.0)
MCV: 92.4 fL (ref 80.0–100.0)
MONO ABS: 296 {cells}/uL (ref 200–950)
MONOS PCT: 8 %
MPV: 11.4 fL (ref 7.5–12.5)
NEUTROS PCT: 43 %
Neutro Abs: 1591 cells/uL (ref 1500–7800)
PLATELETS: 193 10*3/uL (ref 140–400)
RBC: 3.93 MIL/uL (ref 3.80–5.10)
RDW: 13.7 % (ref 11.0–15.0)
WBC: 3.7 10*3/uL — AB (ref 3.8–10.8)

## 2015-11-19 LAB — COMPLETE METABOLIC PANEL WITH GFR
ALBUMIN: 4.2 g/dL (ref 3.6–5.1)
ALT: 14 U/L (ref 6–29)
AST: 18 U/L (ref 10–35)
Alkaline Phosphatase: 52 U/L (ref 33–130)
BILIRUBIN TOTAL: 0.3 mg/dL (ref 0.2–1.2)
BUN: 17 mg/dL (ref 7–25)
CALCIUM: 11.7 mg/dL — AB (ref 8.6–10.4)
CHLORIDE: 108 mmol/L (ref 98–110)
CO2: 31 mmol/L (ref 20–31)
CREATININE: 0.94 mg/dL (ref 0.50–0.99)
GFR, EST AFRICAN AMERICAN: 76 mL/min (ref >=60)
GFR, Est Non African American: 66 mL/min (ref >=60)
Glucose, Bld: 80 mg/dL (ref 65–99)
Potassium: 4.5 mmol/L (ref 3.5–5.3)
Sodium: 143 mmol/L (ref 135–146)
TOTAL PROTEIN: 6.5 g/dL (ref 6.1–8.1)

## 2015-11-19 NOTE — Progress Notes (Signed)
Careteam: Patient Care Team: Lauree Chandler, NP as PCP - General (Geriatric Medicine) Campbell Riches, MD as PCP - Infectious Diseases (Infectious Diseases)  Advanced Directive information Does patient have an advance directive?: No  Allergies  Allergen Reactions  . Sulfonamide Derivatives Swelling    Chief Complaint  Patient presents with  . Medical Management of Chronic Issues    3 month follow up     HPI: Patient is a 60 y.o. female seen in the office today for routine follow up. Pt with hx of osteoporosis, elevated calcium, HIV, HTN, hyperlipidemia  Pt noted with hypercalcemia with elevated PTH and ionized calcium. She was sent to endocrinologist for evaluation of hyperparathyroidism. She has appt with surgery on 11/28th.  Feels like a lot of her joint symptoms are due to hyperparathyroidism - did not do PT and will reevaluate after surgery.  Ongoing fatigue.   following with ID due to HIV.   Review of Systems:  Review of Systems  Constitutional: Negative for activity change, appetite change, fatigue and unexpected weight change.  HENT: Negative for congestion and hearing loss.   Eyes: Negative.   Respiratory: Negative for cough and shortness of breath.   Cardiovascular: Negative for chest pain, palpitations and leg swelling.  Gastrointestinal: Negative for abdominal pain, constipation and diarrhea.  Genitourinary: Negative for difficulty urinating and dysuria.  Musculoskeletal: Positive for arthralgias. Negative for gait problem and myalgias.       Bone and muscle aches, bilateral shoulders  Skin: Negative for color change and wound.  Neurological: Negative for dizziness and weakness.  Psychiatric/Behavioral: Negative for agitation, behavioral problems and confusion. The patient is not nervous/anxious.     Past Medical History:  Diagnosis Date  . HIV infection ([place])   . Hyperlipidemia   . Hypertension   . Pituitary adenoma [employer])    Past Surgical  History:  Procedure Laterality Date  . ABDOMINAL HYSTERECTOMY    . HAMMER TOE SURGERY Bilateral    bunionectomy  . TOE SURGERY    . TONSILLECTOMY    . TUBAL LIGATION     Social History:   reports that she has never smoked. She has never used smokeless tobacco. She reports that she drinks alcohol. She reports that she does not use drugs.  Family History  Problem Relation Age of Onset  . Breast cancer Mother   . Hypertension Mother   . Cancer Mother   . Hyperlipidemia Mother   . Stroke Mother   . Colon cancer Neg Hx     Medications: Patient's Medications  New Prescriptions   No medications on file  Previous Medications   ODEFSEY 200-25-25 MG TABS TABLET    TAKE 1 TABLET BY MOUTH DAILY WITH BREAKFAST. (7/5 WAITING FOR PRIOR AUTH)   OLMESARTAN-HYDROCHLOROTHIAZIDE (BENICAR HCT) 20-12.5 MG TABLET    Take 1 tablet by mouth daily.   OMEGA-3 ACID ETHYL ESTERS (LOVAZA) 1 G CAPSULE    TAKE 1 CAPSULE EVERY DAY   PRAVASTATIN (PRAVACHOL) 40 MG TABLET    Take one tablet by mouth once daily for cholesterol   PROMETHAZINE (PHENERGAN) 12.5 MG TABLET    Take 12.5 mg by mouth every 6 (six) hours as needed for nausea or vomiting. Reported on 05/09/2015   TRAMADOL (ULTRAM) 50 MG TABLET    TAKE 1/2 TO 1 TABLET EVERY 12 HOURS AS NEEDED  Modified Medications   No medications on file  Discontinued Medications   PROMETHAZINE (PHENERGAN) 25 MG TABLET    Take 25  mg by mouth every 8 (eight) hours as needed for nausea or vomiting. Reported on 05/09/2015     Physical Exam:  Vitals:   11/19/15 1418  BP: 122/78  Pulse: 83  Resp: 17  Temp: 98.5 F (36.9 C)  TempSrc: Oral  SpO2: 96%  Weight: 134 lb 3.2 oz (60.9 kg)  Height: 5\' 5"  (1.651 m)   Body mass index is 22.33 kg/m.  Physical Exam  Constitutional: She is oriented to person, place, and time. She appears well-developed and well-nourished. No distress.  HENT:  Head: Normocephalic and atraumatic.  Mouth/Throat: Oropharynx is clear and moist.  No oropharyngeal exudate.  Eyes: Conjunctivae are normal. Pupils are equal, round, and reactive to light.  Neck: Normal range of motion. Neck supple.  Cardiovascular: Normal rate, regular rhythm and normal heart sounds.   Pulmonary/Chest: Effort normal and breath sounds normal.  Abdominal: Soft. Bowel sounds are normal.  Musculoskeletal: She exhibits no edema or tenderness.  Neurological: She is alert and oriented to person, place, and time.  Skin: Skin is warm and dry. She is not diaphoretic.  Psychiatric: She has a normal mood and affect.    Labs reviewed: Basic Metabolic Panel:  Recent Labs  11/27/14 1134 03/15/15 1414 05/09/15 1146 08/13/15 1502  NA 142 142 140  --   K 4.8 3.8 4.1  --   CL 108 101 104  --   CO2 29 28 27   --   GLUCOSE 91 93 91  --   BUN 14 17 17   --   CREATININE 0.89 1.21* 0.92  --   CALCIUM 11.1* 10.8* 11.1* 10.7*   Liver Function Tests:  Recent Labs  11/21/14 0907 05/09/15 1146  AST 18 21  ALT 10 23  ALKPHOS 58 53  BILITOT <0.2 0.4  PROT 6.7 7.0  ALBUMIN 4.3 4.4   No results for input(s): LIPASE, AMYLASE in the last 8760 hours. No results for input(s): AMMONIA in the last 8760 hours. CBC:  Recent Labs  11/27/14 1134 03/15/15 1414 05/09/15 1146  WBC 3.5* 6.1 3.4*  NEUTROABS 1.4 4.2 1,836  HGB 12.4  --  12.1  HCT 38.2 34.6 37.1  MCV 93.4 92 92.5  PLT 172.0 211 204   Lipid Panel:  Recent Labs  05/09/15 1146  CHOL 215*  HDL 76  LDLCALC 122  TRIG 86  CHOLHDL 2.8   TSH: No results for input(s): TSH in the last 8760 hours. A1C: Lab Results  Component Value Date   HGBA1C 5.9 (H) 08/10/2014     Assessment/Plan 1. Essential hypertension -blood pressure stable, conts on olmesartan-hctz  - CBC with Differential/Platelets  2. Hyperlipidemia, unspecified hyperlipidemia type -to cont lifestyle modifications as well as pravachol and lovaza.  -fasting lipids followed by ID and has scheduled lab work .  - COMPLETE METABOLIC  PANEL WITH GFR  3. Hyperglycemia -encouraged lifestyle modifications, will follow up Hemoglobin A1c  4. Hyperparathyroidism ([place]) -following with endocrine due to hyperparathyroidism, meeting with surgery for possible parathyroidectomy on the 28th of this month   5. HIV conts on odefsey per ID   Yariah Selvey K. Harle Battiest  [facility] [phone] 8 am - 5 pm) [phone] (after hours)

## 2015-11-20 LAB — HEMOGLOBIN A1C
Hgb A1c MFr Bld: 5.6 % (ref <5.7)
MEAN PLASMA GLUCOSE: 114 mg/dL

## 2015-12-04 ENCOUNTER — Ambulatory Visit (INDEPENDENT_AMBULATORY_CARE_PROVIDER_SITE_OTHER): Payer: Commercial Managed Care - HMO | Admitting: Infectious Diseases

## 2015-12-04 ENCOUNTER — Encounter: Payer: Self-pay | Admitting: Infectious Diseases

## 2015-12-04 VITALS — BP 108/71 | HR 81 | Temp 98.3°F | Ht 65.0 in | Wt 131.0 lb

## 2015-12-04 DIAGNOSIS — E010 Iodine-deficiency related diffuse (endemic) goiter: Secondary | ICD-10-CM | POA: Diagnosis not present

## 2015-12-04 DIAGNOSIS — Z113 Encounter for screening for infections with a predominantly sexual mode of transmission: Secondary | ICD-10-CM

## 2015-12-04 DIAGNOSIS — E785 Hyperlipidemia, unspecified: Secondary | ICD-10-CM | POA: Diagnosis not present

## 2015-12-04 DIAGNOSIS — B20 Human immunodeficiency virus [HIV] disease: Secondary | ICD-10-CM

## 2015-12-04 DIAGNOSIS — I10 Essential (primary) hypertension: Secondary | ICD-10-CM | POA: Diagnosis not present

## 2015-12-04 DIAGNOSIS — J383 Other diseases of vocal cords: Secondary | ICD-10-CM | POA: Diagnosis not present

## 2015-12-04 DIAGNOSIS — E042 Nontoxic multinodular goiter: Secondary | ICD-10-CM | POA: Diagnosis not present

## 2015-12-04 DIAGNOSIS — E210 Primary hyperparathyroidism: Secondary | ICD-10-CM | POA: Diagnosis not present

## 2015-12-04 LAB — CBC
HEMATOCRIT: 37.9 % (ref 35.0–45.0)
HEMOGLOBIN: 12.3 g/dL (ref 11.7–15.5)
MCH: 30.4 pg (ref 27.0–33.0)
MCHC: 32.5 g/dL (ref 32.0–36.0)
MCV: 93.6 fL (ref 80.0–100.0)
MPV: 10.9 fL (ref 7.5–12.5)
Platelets: 202 10*3/uL (ref 140–400)
RBC: 4.05 MIL/uL (ref 3.80–5.10)
RDW: 13.4 % (ref 11.0–15.0)
WBC: 3.7 10*3/uL — AB (ref 3.8–10.8)

## 2015-12-04 LAB — COMPREHENSIVE METABOLIC PANEL
ALBUMIN: 4.2 g/dL (ref 3.6–5.1)
ALK PHOS: 49 U/L (ref 33–130)
ALT: 12 U/L (ref 6–29)
AST: 17 U/L (ref 10–35)
BILIRUBIN TOTAL: 0.4 mg/dL (ref 0.2–1.2)
BUN: 13 mg/dL (ref 7–25)
CALCIUM: 11.4 mg/dL — AB (ref 8.6–10.4)
CO2: 29 mmol/L (ref 20–31)
CREATININE: 1.07 mg/dL — AB (ref 0.50–0.99)
Chloride: 105 mmol/L (ref 98–110)
GLUCOSE: 118 mg/dL — AB (ref 65–99)
Potassium: 3.9 mmol/L (ref 3.5–5.3)
SODIUM: 141 mmol/L (ref 135–146)
Total Protein: 6.9 g/dL (ref 6.1–8.1)

## 2015-12-04 LAB — LIPID PANEL
Cholesterol: 214 mg/dL — ABNORMAL HIGH (ref <200)
HDL: 72 mg/dL (ref >50)
LDL CALC: 118 mg/dL — AB (ref <100)
Total CHOL/HDL Ratio: 3 Ratio (ref <5.0)
Triglycerides: 119 mg/dL (ref <150)
VLDL: 24 mg/dL (ref <30)

## 2015-12-04 NOTE — Assessment & Plan Note (Signed)
Will recheck her lipids today Prev LFTs normal.

## 2015-12-04 NOTE — Progress Notes (Signed)
   Subjective:    Patient ID: Laurie Gray, female    DOB: 07/13/1955, 60 y.o.   MRN: [id number]  HPI 60 yo F with hx of dx 2000 HIV+, depression (she states this was temporary), hyperlipidemia, prev lap hysterecomy-BSO (2009). Previously took D4T cocktail, went to undetectable. Took herself off for 5 years. CD4 went under 200 and was restarted on atripla. Marland Kitchen  Has had f/u with GYN 04-2014 (nl). Has had previous abn on colpo (SIL).  Had normal mammo 03-2014 Had colon 2013.  At her previous visit was changed to [facility] after she had been started on fosamax for low bone mineral density.  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 Has been having muscle weakness, joint pain. PTH 97 (nl < 64)  HIV 1 RNA Quant (copies/mL)  Date Value  05/09/2015 <20  10/11/2014 <20  05/25/2014 <20   CD4 T Cell Abs (/uL)  Date Value  05/09/2015 560  10/11/2014 640  05/25/2014 580   mammo normal 08-2015.   Review of Systems  Constitutional: Negative for appetite change and unexpected weight change.  Gastrointestinal: Negative for constipation and diarrhea.  Genitourinary: Positive for frequency. Negative for difficulty urinating.  Musculoskeletal: Positive for myalgias.      Objective:   Physical Exam  Constitutional: She appears well-developed and well-nourished.  Eyes: EOM are normal. Pupils are equal, round, and reactive to light.  Neck: Neck supple. Thyromegaly present.  Cardiovascular: Normal rate, regular rhythm and normal heart sounds.   Pulmonary/Chest: Effort normal and breath sounds normal.  Abdominal: Soft. Bowel sounds are normal. There is no tenderness. There is no rebound.  Musculoskeletal: She exhibits no edema.  Lymphadenopathy:    She has no cervical adenopathy.      Assessment & Plan:

## 2015-12-04 NOTE — Assessment & Plan Note (Signed)
Well controlled today.

## 2015-12-04 NOTE — Assessment & Plan Note (Addendum)
Scheduled for thyroid/parathyroid bx next week.  apprecitae surgery, endo f/u.

## 2015-12-04 NOTE — Assessment & Plan Note (Signed)
She has some side effects but wants to stay on odefsy. Will take in pm with diner.  Offered/refused condoms.  Flu shot prev Labs today rtc in 6 months.

## 2015-12-05 LAB — T-HELPER CELL (CD4) - (RCID CLINIC ONLY)
CD4 T CELL HELPER: 42 % (ref 33–55)
CD4 T Cell Abs: 670 /uL (ref 400–2700)

## 2015-12-06 ENCOUNTER — Other Ambulatory Visit: Payer: Self-pay | Admitting: General Surgery

## 2015-12-06 DIAGNOSIS — E042 Nontoxic multinodular goiter: Secondary | ICD-10-CM

## 2015-12-06 LAB — HIV-1 RNA QUANT-NO REFLEX-BLD
HIV 1 RNA Quant: <20 copies/mL (ref <20)
HIV-1 RNA Quant, Log: <1.3 Log copies/mL (ref <1.30)

## 2015-12-07 DIAGNOSIS — C73 Malignant neoplasm of thyroid gland: Secondary | ICD-10-CM

## 2015-12-07 HISTORY — DX: Malignant neoplasm of thyroid gland: C73

## 2015-12-13 ENCOUNTER — Ambulatory Visit
Admission: RE | Admit: 2015-12-13 | Discharge: 2015-12-13 | Disposition: A | Discharge status: Home / Self Care | Payer: Commercial Managed Care - HMO | Source: Ambulatory Visit | Attending: General Surgery | Admitting: General Surgery

## 2015-12-13 ENCOUNTER — Other Ambulatory Visit (HOSPITAL_COMMUNITY)
Admission: RE | Admit: 2015-12-13 | Discharge: 2015-12-13 | Disposition: A | Discharge status: Home / Self Care | Payer: Commercial Managed Care - HMO | Source: Ambulatory Visit | Attending: General Surgery | Admitting: General Surgery

## 2015-12-13 DIAGNOSIS — E041 Nontoxic single thyroid nodule: Secondary | ICD-10-CM | POA: Insufficient documentation

## 2015-12-13 DIAGNOSIS — E042 Nontoxic multinodular goiter: Secondary | ICD-10-CM

## 2015-12-14 ENCOUNTER — Other Ambulatory Visit: Payer: Self-pay | Admitting: Infectious Diseases

## 2015-12-14 DIAGNOSIS — E785 Hyperlipidemia, unspecified: Secondary | ICD-10-CM

## 2015-12-16 ENCOUNTER — Other Ambulatory Visit: Payer: Self-pay | Admitting: Internal Medicine

## 2015-12-17 NOTE — Telephone Encounter (Signed)
Patient last refilled med on 06/2015, no other refills ok to fill?

## 2015-12-21 DIAGNOSIS — D352 Benign neoplasm of pituitary gland: Secondary | ICD-10-CM | POA: Diagnosis not present

## 2015-12-21 DIAGNOSIS — D351 Benign neoplasm of parathyroid gland: Secondary | ICD-10-CM | POA: Diagnosis not present

## 2015-12-21 DIAGNOSIS — J383 Other diseases of vocal cords: Secondary | ICD-10-CM | POA: Diagnosis not present

## 2015-12-21 DIAGNOSIS — Z21 Asymptomatic human immunodeficiency virus [HIV] infection status: Secondary | ICD-10-CM | POA: Diagnosis not present

## 2015-12-21 DIAGNOSIS — C73 Malignant neoplasm of thyroid gland: Secondary | ICD-10-CM | POA: Diagnosis not present

## 2016-01-09 ENCOUNTER — Other Ambulatory Visit: Payer: Self-pay | Admitting: Nurse Practitioner

## 2016-01-15 ENCOUNTER — Ambulatory Visit: Payer: Self-pay | Admitting: Otolaryngology

## 2016-01-15 NOTE — H&P (Signed)
Laurie Gray is a 61 y.o. female who presents as a consult Patient.   Referring Provider: Jackolyn Confer, MD  Chief complaint: Thyroid cancer, parathyroid adenoma.  HPI: History of hypercalcemia for many years. Recent evaluation with sestamibi scan revealed a right inferior parathyroid adenoma. 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 History of HIV infection, stable. History of small pituitary adenoma. Symptoms and history include kidney stones, fatigue, muscle aches, bone pain. Additional history of spasmodic dysphonia. She has not had Botox injections for several years and has been doing well.  PMH/Meds/All/SocHx/FamHx/ROS:   Past Medical History:  Diagnosis Date  . Arthritis  . Cataract  . Elevated cholesterol  . HIV (human immunodeficiency virus infection) ([place])  . Hypertension   Past Surgical History:  Procedure Laterality Date  . FOOT SURGERY  . HYSTERECTOMY  . TONSILLECTOMY   No family history of bleeding disorders, wound healing problems or difficulty with anesthesia.   Social History   Social History  . Marital status: Single  Spouse name: N/A  . Number of children: N/A  . Years of education: N/A   Occupational History  . Not on file.   Social History Main Topics  . Smoking status: Never Smoker  . Smokeless tobacco: Never Used  . Alcohol use Not on file  . Drug use: Unknown  . Sexual activity: Not on file   Other Topics Concern  . Not on file   Social History Narrative  . No narrative on file   Current Outpatient Prescriptions:  . BENICAR HCT 20-12.5 mg per tablet, , Disp: , Rfl:  . ODEFSEY 200-25-25 mg per tablet, TAKE 1 TABLET BY MOUTH DAILY WITH BREAKFAST. (7/5 WAITING FOR PRIOR AUTH), Disp: , Rfl: 3 . omega-3 acid ethyl esters (LOVAZA) 1 gram capsule, TAKE 1 CAPSULE EVERY DAY, Disp: , Rfl: 3 . pravastatin (PRAVACHOL) 40 MG tablet, TAKE 1 TABLET (40 MG TOTAL) BY MOUTH DAILY., Disp: , Rfl: 1 . traMADol  (ULTRAM) 50 mg tablet, , Disp: , Rfl:   A complete ROS was performed with pertinent positives/negatives noted in the HPI. The remainder of the ROS are negative.   Physical Exam:   BP 118/75 (Site: Left arm)  Wt 53.5 kg (118 lb)  BMI 19.64 kg/m   General: Healthy and alert, in no distress, breathing easily. Normal affect. In a pleasant mood. Head: Normocephalic, atraumatic. No masses, or scars. Eyes: Pupils are equal, and reactive to light. Vision is grossly intact. No spontaneous or gaze nystagmus. Ears: Ear canals are clear. Tympanic membranes are intact, with normal landmarks and the middle ears are clear and healthy. Hearing: Grossly normal. Nose: Nasal cavities are clear with healthy mucosa, no polyps or exudate.Airways are patent. Face: No masses or scars, facial nerve function is symmetric. Oral Cavity: No mucosal abnormalities are noted. Tongue with normal mobility. Dentition appears healthy. Oropharynx: Tonsils are symmetric. There are no mucosal masses identified. Tongue base appears normal and healthy. Larynx/Hypopharynx: indirect exam reveals healthy, mobile vocal cords, without mucosal lesions in the hypopharynx or larynx. Chest: Deferred Neck: There is no palpable cervical adenopathy. There is a palpable mass in the right anterior thyroid, approximately 6 cm. Neuro: Cranial nerves II-XII will normal function. Balance: Normal gate. Other findings: none.  Independent Review of Additional Tests or Records:  none  Procedures:  none  Impression & Plans:  Right side papillary thyroid carcinoma, right side parathyroid adenoma. Recommend total thyroidectomy, parathyroid exploration and parathyroidectomy on the right. Risks and benefits were  discussed including persistent hypercalcemia, hypocalcemia, recurrent nerve injury, possible need for tracheostomy. All questions were answered. We will schedule at her convenience.

## 2016-01-16 ENCOUNTER — Other Ambulatory Visit: Payer: Self-pay | Admitting: Nurse Practitioner

## 2016-01-21 ENCOUNTER — Ambulatory Visit (INDEPENDENT_AMBULATORY_CARE_PROVIDER_SITE_OTHER): Payer: Medicare HMO | Admitting: Nurse Practitioner

## 2016-01-21 ENCOUNTER — Ambulatory Visit (INDEPENDENT_AMBULATORY_CARE_PROVIDER_SITE_OTHER): Payer: Medicare HMO

## 2016-01-21 ENCOUNTER — Ambulatory Visit
Admission: RE | Admit: 2016-01-21 | Discharge: 2016-01-21 | Disposition: A | Discharge status: Home / Self Care | Payer: Medicare HMO | Source: Ambulatory Visit | Attending: Nurse Practitioner | Admitting: Nurse Practitioner

## 2016-01-21 ENCOUNTER — Encounter: Payer: Self-pay | Admitting: Nurse Practitioner

## 2016-01-21 VITALS — BP 118/70 | HR 80 | Temp 98.2°F | Ht 65.0 in | Wt 131.4 lb

## 2016-01-21 DIAGNOSIS — C73 Malignant neoplasm of thyroid gland: Secondary | ICD-10-CM | POA: Insufficient documentation

## 2016-01-21 DIAGNOSIS — B20 Human immunodeficiency virus [HIV] disease: Secondary | ICD-10-CM | POA: Diagnosis not present

## 2016-01-21 DIAGNOSIS — M25512 Pain in left shoulder: Principal | ICD-10-CM

## 2016-01-21 DIAGNOSIS — Z0000 Encounter for general adult medical examination without abnormal findings: Secondary | ICD-10-CM | POA: Diagnosis not present

## 2016-01-21 DIAGNOSIS — G8929 Other chronic pain: Secondary | ICD-10-CM

## 2016-01-21 DIAGNOSIS — E213 Hyperparathyroidism, unspecified: Secondary | ICD-10-CM | POA: Diagnosis not present

## 2016-01-21 DIAGNOSIS — M25511 Pain in right shoulder: Principal | ICD-10-CM

## 2016-01-21 DIAGNOSIS — M47812 Spondylosis without myelopathy or radiculopathy, cervical region: Secondary | ICD-10-CM | POA: Diagnosis not present

## 2016-01-21 NOTE — Patient Instructions (Signed)

## 2016-01-21 NOTE — Progress Notes (Signed)
Quick Notes   Health Maintenance:  Pt to talk to pharmacy about OOP expense for TDAP and Shingles.     Abnormal Screen: None; MMSE-30/30 Passed Clock test    Patient Concerns:  Pt would like to discuss constant pain throughout her neck, shoulders, back and arms. States she is unable to sleep at night due to the pain. She was recently DX with Thyroid cancer, having surgery on 01/31/16. Pt would also like a referral for an opthalmologist.     Nurse Concerns:  None

## 2016-01-21 NOTE — Patient Instructions (Addendum)
Laurie Gray , Thank you for taking time to come for your Medicare Wellness Visit. I appreciate your ongoing commitment to your health goals. Please review the following plan we discussed and let me know if I can assist you in the future.   These are the goals we discussed: Goals    . Increase physical activity          Starting 01/21/16, I will attempt to increase my water intake from 2 cups to 5 cups per day.        This is a list of the screening recommended for you and due dates:  Health Maintenance  Topic Date Due  . Shingles Vaccine  01/07/2019*  . Tetanus Vaccine  01/07/2020*  . Pap Smear  04/18/2017  . Mammogram  08/14/2017  . Colon Cancer Screening  01/09/2025  . Flu Shot  Completed  .  Hepatitis C: One time screening is recommended by Center for Disease Control  (CDC) for  adults born from [birth year] through 1965.   Completed  . HIV Screening  Completed  *Topic was postponed. The date shown is not the original due date.  Preventive Care for Adults  A healthy lifestyle and preventive care can promote health and wellness. Preventive health guidelines for adults include the following key practices.  . A routine yearly physical is a good way to check with your health care provider about your health and preventive screening. It is a chance to share any concerns and updates on your health and to receive a thorough exam.  . Visit your dentist for a routine exam and preventive care every 6 months. Brush your teeth twice a day and floss once a day. Good oral hygiene prevents tooth decay and gum disease.  . The frequency of eye exams is based on your age, health, family medical history, use  of contact lenses, and other factors. Follow your health care provider's ecommendations for frequency of eye exams.  . Eat a healthy diet. Foods like vegetables, fruits, whole grains, low-fat dairy products, and lean protein foods contain the nutrients you need without too many calories. Decrease your  intake of foods high in solid fats, added sugars, and salt. Eat the right amount of calories for you. Get information about a proper diet from your health care provider, if necessary.  . Regular physical exercise is one of the most important things you can do for your health. Most adults should get at least 150 minutes of moderate-intensity exercise (any activity that increases your heart rate and causes you to sweat) each week. In addition, most adults need muscle-strengthening exercises on 2 or more days a week.  Silver Sneakers may be a benefit available to you. To determine eligibility, you may visit the website: www.silversneakers.com or contact program at 418-511-5672 Mon-Fri between 8AM-8PM.   . Maintain a healthy weight. The body mass index (BMI) is a screening tool to identify possible weight problems. It provides an estimate of body fat based on height and weight. Your health care provider can find your BMI and can help you achieve or maintain a healthy weight.   For adults 20 years and older: ? A BMI below 18.5 is considered underweight. ? A BMI of 18.5 to 24.9 is normal. ? A BMI of 25 to 29.9 is considered overweight. ? A BMI of 30 and above is considered obese.   . Maintain normal blood lipids and cholesterol levels by exercising and minimizing your intake of saturated fat. Eat a balanced  diet with plenty of fruit and vegetables. Blood tests for lipids and cholesterol should begin at age 40 and be repeated every 5 years. If your lipid or cholesterol levels are high, you are over 50, or you are at high risk for heart disease, you may need your cholesterol levels checked more frequently. Ongoing high lipid and cholesterol levels should be treated with medicines if diet and exercise are not working.  . If you smoke, find out from your health care provider how to quit. If you do not use tobacco, please do not start.  . If you choose to drink alcohol, please do not consume more than 2  drinks per day. One drink is considered to be 12 ounces (355 mL) of beer, 5 ounces (148 mL) of wine, or 1.5 ounces (44 mL) of liquor.  . If you are 40-17 years old, ask your health care provider if you should take aspirin to prevent strokes.  . Use sunscreen. Apply sunscreen liberally and repeatedly throughout the day. You should seek shade when your shadow is shorter than you. Protect yourself by wearing long sleeves, pants, a wide-brimmed hat, and sunglasses year round, whenever you are outdoors.  . Once a month, do a whole body skin exam, using a mirror to look at the skin on your back. Tell your health care provider of new moles, moles that have irregular borders, moles that are larger than a pencil eraser, or moles that have changed in shape or color.

## 2016-01-21 NOTE — Progress Notes (Signed)
Provider: Lauree Chandler, NP  Patient Care Team: Lauree Chandler, NP as PCP - General (Geriatric Medicine) Campbell Riches, MD as PCP - Infectious Diseases (Infectious Diseases) Jackolyn Confer, MD as Consulting Physician (General Surgery)  Extended Emergency Contact Information Primary Emergency Contact: [facility] Address: [address] [place], [place] [address] Johnnette Litter of [employer] Phone: [phone] Relation: Son Allergies  Allergen Reactions  . Sulfonamide Derivatives Swelling   Goals of Care: Advanced Directive information Advanced Directives 01/21/2016  Does Patient Have a Medical Advance Directive? No  Would patient like information on creating a medical advance directive? -     Chief Complaint  Patient presents with  . Medical Management of Chronic Issues    Complete physical. Passed clock drawing.     HPI: Patient is a 61 y.o. female seen in today for an annual wellness exam.   In the last year she was found that she had thyroid cancer and hyperparathyroidism. Plan to remove thyroid on 01/31/16.  apprehensive about surgery.  Having a lot of pain to bilateral shoulders- which has been chronic but decided not to do anything about this until after her surgery.  Recommended therapy but she has put this off.  Has used icyhot and NSAIDS.  Having a hard time to pull her pants, seat belt.   Depression screen [facility] 2/9 01/21/2016 01/21/2016 12/04/2015 11/19/2015 11/08/2014  Decreased Interest 0 0 0 0 0  Down, Depressed, Hopeless 0 0 0 0 0  PHQ - 2 Score 0 0 0 0 0  Some recent data might be hidden  occasionally will get sad in the evening. 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  [facility] but has not been to her for a while.    Fall Risk  01/21/2016 01/21/2016 12/04/2015 11/19/2015 08/13/2015  Falls in the past year? No No No No No   MMSE - Mini Mental State Exam 01/21/2016  Orientation to  time 5  Orientation to Place 5  Registration 3  Attention/ Calculation 5  Recall 3  Language- name 2 objects 2  Language- repeat 1  Language- follow 3 step command 3  Language- read & follow direction 1  Write a sentence 1  Copy design 1  Total score 30     Health Maintenance  Topic Date Due  . ZOSTAVAX  01/07/2019 (Originally 10/02/2015)  . TETANUS/TDAP  01/07/2020 (Originally 10/02/1974)  . PAP SMEAR  04/18/2017  . MAMMOGRAM  08/14/2017  . COLONOSCOPY  01/09/2025  . INFLUENZA VACCINE  Completed  . Hepatitis C Screening  Completed  . HIV Screening  Completed   PAP through GYN  Urinary incontinence?  none Diet? Attempts to make healthy choices, reports she thinks she eats too much sugar.   Dentition:overdue.  Pain:to bilateral shoulders and upper back Exercise- does not do much exercise.   Past Medical History:  Diagnosis Date  . HIV infection ([place])   . Hyperlipidemia   . Hypertension   . Pituitary adenoma ([place])   . Thyroid cancer ([place]) 12/2015    Past Surgical History:  Procedure Laterality Date  . ABDOMINAL HYSTERECTOMY    . HAMMER TOE SURGERY Bilateral    bunionectomy  . TOE SURGERY    . TONSILLECTOMY    . TUBAL LIGATION      Social History   Social History  . Marital status: Widowed  Spouse name: N/A  . Number of children: N/A  . Years of education: N/A   Social History Main Topics  . Smoking status: Never Smoker  . Smokeless tobacco: Never Used  . Alcohol use 0.0 oz/week     Comment: socially  . Drug use: No  . Sexual activity: Not Currently    Partners: Male     Comment: pt. given condoms. 1st intercourse- 16, partners- greater than 5    Other Topics Concern  . None   Social History Narrative   Normal diet   Drinks Caffeine- coffee/coke/chocolate    Lives in an apartment/ third level/1 person/1 pet   Past profession- Museum/gallery exhibitions officer, cosmetologist   Exercises 3-4 x weekly (cardio and weights)        Family History  Problem  Relation Age of Onset  . Breast cancer Mother   . Hypertension Mother   . Cancer Mother   . Hyperlipidemia Mother   . Stroke Mother   . Colon cancer Neg Hx     Review of Systems:  Review of Systems  Constitutional: Negative for activity change, appetite change, fatigue and unexpected weight change.  HENT: Negative for congestion and hearing loss.   Eyes: Negative.   Respiratory: Negative for cough and shortness of breath.   Cardiovascular: Negative for chest pain, palpitations and leg swelling.  Gastrointestinal: Negative for abdominal pain, constipation and diarrhea.  Genitourinary: Negative for difficulty urinating and dysuria.  Musculoskeletal: Positive for arthralgias, myalgias, neck pain and neck stiffness. Negative for gait problem.       Bone and muscle aches, bilateral shoulders  Skin: Negative for color change and wound.  Neurological: Negative for dizziness and weakness.  Psychiatric/Behavioral: Negative for agitation, behavioral problems and confusion. The patient is nervous/anxious.      Allergies as of 01/21/2016      Reactions   Sulfonamide Derivatives Swelling      Medication List       Accurate as of 01/21/16  1:37 PM. Always use your most recent med list.          GLUCOSAMINE PO Take 1 tablet by mouth daily.   ICY HOT ADVANCED RELIEF 16-11 % Crea Generic drug:  Menthol-Camphor Apply 1 application topically 4 (four) times daily as needed (for shoulder pain).   naproxen sodium 220 MG tablet Commonly known as:  ANAPROX Take 220 mg by mouth at bedtime.   ODEFSEY 200-25-25 MG Tabs tablet Generic drug:  emtricitabine-rilpivir-tenofovir AF TAKE 1 TABLET BY MOUTH DAILY WITH BREAKFAST. (7/5 WAITING FOR PRIOR AUTH)   olmesartan-hydrochlorothiazide 20-12.5 MG tablet Commonly known as:  BENICAR HCT TAKE 1 TABLET BY MOUTH DAILY.   omega-3 acid ethyl esters 1 g capsule Commonly known as:  LOVAZA TAKE 1 CAPSULE EVERY DAY   pravastatin 40 MG  tablet Commonly known as:  PRAVACHOL Take one tablet by mouth once daily for cholesterol   promethazine 12.5 MG tablet Commonly known as:  PHENERGAN Take 12.5 mg by mouth every 6 (six) hours as needed for nausea or vomiting. Reported on 05/09/2015   traMADol 50 MG tablet Commonly known as:  ULTRAM TAKE 1/2 TO 1 TABLET BY MOUTH EVERY 12 HOURS AS NEEDED         Physical Exam: Vitals:   01/21/16 1318  BP: 118/70  Pulse: 80  Temp: 98.2 F (36.8 C)  SpO2: 97%  Weight: 131 lb 6.4 oz (59.6 kg)  Height: 5\' 5"  (1.651 m)   Body mass index is 21.87 kg/m. Physical Exam  Constitutional: She is oriented to person, place, and time. She appears well-developed and well-nourished. No distress.  HENT:  Head: Normocephalic and atraumatic.  Mouth/Throat: Oropharynx is clear and moist. No oropharyngeal exudate.  Eyes: Conjunctivae are normal. Pupils are equal, round, and reactive to light.  Neck: Normal range of motion. Neck supple.  Cardiovascular: Normal rate, regular rhythm and normal heart sounds.   Pulmonary/Chest: Effort normal and breath sounds normal.  Abdominal: Soft. Bowel sounds are normal.  Musculoskeletal: She exhibits tenderness. She exhibits no edema.       Right shoulder: She exhibits decreased range of motion, tenderness, bony tenderness and decreased strength. She exhibits no swelling, no effusion and no crepitus.       Left shoulder: She exhibits decreased range of motion, tenderness, bony tenderness, pain and decreased strength.  Drop arm test negative, decrease strength bilaterally initially right shoulder was more painful and decrease ROM now left shoulder is worse.   Neurological: She is alert and oriented to person, place, and time.  Skin: Skin is warm and dry. She is not diaphoretic.  Psychiatric: Her mood appears anxious.    Labs reviewed: Basic Metabolic Panel:  Recent Labs  05/09/15 1146 08/13/15 1502 11/19/15 1516 12/04/15 1623  NA 140  --  143 141  K  4.1  --  4.5 3.9  CL 104  --  108 105  CO2 27  --  31 29  GLUCOSE 91  --  80 118*  BUN 17  --  17 13  CREATININE 0.92  --  0.94 1.07*  CALCIUM 11.1* 10.7* 11.7* 11.4*   Liver Function Tests:  Recent Labs  05/09/15 1146 11/19/15 1516 12/04/15 1623  AST 21 18 17   ALT 23 14 12   ALKPHOS 53 52 49  BILITOT 0.4 0.3 0.4  PROT 7.0 6.5 6.9  ALBUMIN 4.4 4.2 4.2   No results for input(s): LIPASE, AMYLASE in the last 8760 hours. No results for input(s): AMMONIA in the last 8760 hours. CBC:  Recent Labs  03/15/15 1414 05/09/15 1146 11/19/15 1516 12/04/15 1623  WBC 6.1 3.4* 3.7* 3.7*  NEUTROABS 4.2 1,836 1,591  --   HGB  --  12.1 11.9 12.3  HCT 34.6 37.1 36.3 37.9  MCV 92 92.5 92.4 93.6  PLT 211 204 193 202   Lipid Panel:  Recent Labs  05/09/15 1146 12/04/15 1623  CHOL 215* 214*  HDL 76 72  LDLCALC 122 118*  TRIG 86 119  CHOLHDL 2.8 3.0   Lab Results  Component Value Date   HGBA1C 5.6 11/19/2015    Procedures: No results found.  Assessment/Plan 1. Wellness examination The patient was counseled regarding the appropriate use of alcohol, regular self-examination of the breasts on a monthly basis, prevention of dental and periodontal disease, diet, regular sustained exercise for at least 30 minutes 5 times per week, routine screening interval for mammogram as recommended by the [place] and ACOG, importance of regular PAP smears, smoking cessation, tobacco use,  and recommended schedule for GI hemoccult testing, colonoscopy, cholesterol, thyroid and diabetes screening.  2. Chronic pain of both shoulders -pt never went to PT as she felt like her shoulders would "improve" on their own and that a lot of her joint symptoms were due to her hyperparathyroidism, since last visit they have gotten much worse. Now left shoulder and neck with increase pain, decrease ROM and interferrng with ADLs.  -will get xray of shoulder and cervical spine, if normal will refer to  ortho for further evaluation -discussed risk for frozen shoulder if left untreated. 3 - DG Cervical Spine Complete; Future - DG Shoulder Left; Future  3. Hyperparathyroidism [facility]) Following with endocrinology and surgery, surgery scheduled for total thyroidectomy on 01/31/16  4. Human immunodeficiency virus (HIV) disease ([place]) Following with ID  5. 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 Harle Battiest  [facility] [phone] 8 am - 5 pm) [phone] (after hours)

## 2016-01-21 NOTE — Progress Notes (Signed)
Subjective:   Laurie Gray is a 61 y.o. female who presents for an Initial Medicare Annual Wellness Visit.  Review of Systems     Cardiac Risk Factors include: advanced age (>17men, >4 women);dyslipidemia;hypertension;sedentary lifestyle     Objective:    Today's Vitals   01/21/16 1237  BP: 118/70  Pulse: 80  Temp: 98.2 F (36.8 C)  TempSrc: Oral  SpO2: 97%  Weight: 131 lb 6.4 oz (59.6 kg)  Height: 5\' 5"  (1.651 m)  PainSc: 8   PainLoc: Shoulder   Body mass index is 21.87 kg/m.   Current Medications (verified) Outpatient Encounter Prescriptions as of 01/21/2016  Medication Sig  . Glucosamine HCl (GLUCOSAMINE PO) Take 1 tablet by mouth daily.  . Menthol-Camphor (ICY HOT ADVANCED RELIEF) 16-11 % CREA Apply 1 application topically 4 (four) times daily as needed (for shoulder pain).  . naproxen sodium (ANAPROX) 220 MG tablet Take 220 mg by mouth at bedtime.  . ODEFSEY 200-25-25 MG TABS tablet TAKE 1 TABLET BY MOUTH DAILY WITH BREAKFAST. (7/5 WAITING FOR PRIOR [place])  . olmesartan-hydrochlorothiazide (BENICAR HCT) 20-12.5 MG tablet TAKE 1 TABLET BY MOUTH DAILY.  Marland Kitchen omega-3 acid ethyl esters (LOVAZA) 1 G capsule TAKE 1 CAPSULE EVERY DAY  . pravastatin (PRAVACHOL) 40 MG tablet Take one tablet by mouth once daily for cholesterol  . promethazine (PHENERGAN) 12.5 MG tablet Take 12.5 mg by mouth every 6 (six) hours as needed for nausea or vomiting. Reported on 05/09/2015  . traMADol (ULTRAM) 50 MG tablet TAKE 1/2 TO 1 TABLET BY MOUTH EVERY 12 HOURS AS NEEDED   No facility-administered encounter medications on file as of 01/21/2016.     Allergies (verified) Sulfonamide derivatives   History: Past Medical History:  Diagnosis Date  . HIV infection ([place])   . Hyperlipidemia   . Hypertension   . Pituitary adenoma ([place])   . Thyroid cancer ([place]) 12/2015   Past Surgical History:  Procedure Laterality Date  . ABDOMINAL HYSTERECTOMY    . HAMMER TOE SURGERY Bilateral    bunionectomy  . TOE SURGERY    . TONSILLECTOMY    . TUBAL LIGATION     Family History  Problem Relation Age of Onset  . Breast cancer Mother   . Hypertension Mother   . Cancer Mother   . Hyperlipidemia Mother   . Stroke Mother   . Colon cancer Neg Hx    Social History   Occupational History  . Not on file.   Social History Main Topics  . Smoking status: Never Smoker  . Smokeless tobacco: Never Used  . Alcohol use 0.0 oz/week     Comment: socially  . Drug use: No  . Sexual activity: Not Currently    Partners: Male     Comment: pt. given condoms. 1st intercourse- 16, partners- greater than 5     Tobacco Counseling Counseling given: Not Answered   Activities of Daily Living In your present state of health, do you have any difficulty performing the following activities: 01/21/2016 12/04/2015  Hearing? N N  Vision? Y N  Difficulty concentrating or making decisions? Y N  Walking or climbing stairs? N N  Dressing or bathing? N N  Doing errands, shopping? N N  Preparing Food and eating ? N -  Using the Toilet? N -  In the past six months, have you accidently leaked urine? Y -  Do you have problems with loss of bowel control? N -  Managing your Medications? N -  Managing your Finances? N -  Housekeeping or managing your Housekeeping? N -  Some recent data might be hidden    Immunizations and Health Maintenance Immunization History  Administered Date(s) Administered  . Hepatitis B, adult 06/09/2013, 07/11/2013, 12/05/2013  . Influenza Split 09/08/2010, 11/10/2011  . Influenza Whole 12/25/2008, 09/24/2009  . Influenza,inj,Quad PF,36+ Mos 10/11/2012, 11/02/2013, 11/08/2014, 10/22/2015  . Pneumococcal Conjugate-13 03/28/2014  . Pneumococcal Polysaccharide-23 01/24/2010   There are no preventive care reminders to display for this patient.  Patient Care Team: Lauree Chandler, NP as PCP - General (Geriatric Medicine) Campbell Riches, MD as PCP - Infectious  Diseases (Infectious Diseases) Jackolyn Confer, MD as Consulting Physician (General Surgery)  Indicate any recent Medical Services you may have received from other than [facility] providers in the past year (date may be approximate).     Assessment:   This is a routine wellness examination for Laurie Gray.   Hearing/Vision screen  Visual Acuity Screening   Right eye Left eye Both eyes  Without correction: 20/30-1 20/50 20/30  With correction:     Comments: Pt has not had an eye exam in several years. Requests a referral opthalmologist.   Hearing Screening Comments: Pt has never had a hearing screen. Unable to do office screening. Denies any hearing loss.  Dietary issues and exercise activities discussed: Current Exercise Habits: The patient does not participate in regular exercise at present, Exercise limited by: None identified  Goals    . Increase physical activity          Starting 01/21/16, I will attempt to increase my water intake from 2 cups to 5 cups per day.       Depression Screen PHQ 2/9 Scores 01/21/2016 12/04/2015 11/19/2015 11/08/2014 07/07/2014 03/28/2014 12/05/2013  PHQ - 2 Score 0 0 0 0 0 0 0    Fall Risk Fall Risk  01/21/2016 12/04/2015 11/19/2015 08/13/2015 03/15/2015  Falls in the past year? No No No No No    Cognitive Function: MMSE - Mini Mental State Exam 01/21/2016  Orientation to time 5  Orientation to Place 5  Registration 3  Attention/ Calculation 5  Recall 3  Language- name 2 objects 2  Language- repeat 1  Language- follow 3 step command 3  Language- read & follow direction 1  Write a sentence 1  Copy design 1  Total score 30        Screening Tests Health Maintenance  Topic Date Due  . ZOSTAVAX  01/07/2019 (Originally 10/02/2015)  . TETANUS/TDAP  01/07/2020 (Originally 10/02/1974)  . PAP SMEAR  04/18/2017  . MAMMOGRAM  08/14/2017  . COLONOSCOPY  01/09/2025  . INFLUENZA VACCINE  Completed  . Hepatitis C Screening  Completed  . HIV Screening   Completed      Plan:    I have personally reviewed and addressed the Medicare Annual Wellness questionnaire and have noted the following in the patient's chart:  A. Medical and social history B. Use of alcohol, tobacco or illicit drugs  C. Current medications and supplements D. Functional ability and status E.  Nutritional status F.  Physical activity G. Advance directives H. List of other physicians I.  Hospitalizations, surgeries, and ER visits in previous 12 months J.  [place] to include hearing, vision, cognitive, depression L. Referrals and appointments - none  In addition, I have reviewed and discussed with patient certain preventive protocols, quality metrics, and best practice recommendations. A written personalized care plan for preventive services as well as general  preventive health recommendations were provided to patient.  See attached scanned questionnaire for additional information.   Signed,   Allyn Kenner, LPN Health Advisor  I reviewed health advisors note, was available for consultation and agree with documentation and plan.  Carlos American. Harle Battiest  [facility] [phone] 8 am - 5 pm) [phone] (after hours)

## 2016-01-22 ENCOUNTER — Other Ambulatory Visit: Payer: Self-pay | Admitting: Nurse Practitioner

## 2016-01-22 DIAGNOSIS — M25511 Pain in right shoulder: Secondary | ICD-10-CM

## 2016-01-22 DIAGNOSIS — M25512 Pain in left shoulder: Principal | ICD-10-CM

## 2016-01-24 ENCOUNTER — Other Ambulatory Visit: Payer: Self-pay

## 2016-01-24 ENCOUNTER — Encounter (HOSPITAL_COMMUNITY)
Admission: RE | Admit: 2016-01-24 | Discharge: 2016-01-24 | Disposition: A | Discharge status: Home / Self Care | Payer: Medicare HMO | Source: Ambulatory Visit | Attending: Otolaryngology | Admitting: Otolaryngology

## 2016-01-24 ENCOUNTER — Ambulatory Visit (HOSPITAL_COMMUNITY)
Admission: RE | Admit: 2016-01-24 | Discharge: 2016-01-24 | Disposition: A | Discharge status: Home / Self Care | Payer: Medicare HMO | Source: Ambulatory Visit | Attending: Anesthesiology | Admitting: Anesthesiology

## 2016-01-24 ENCOUNTER — Encounter (HOSPITAL_COMMUNITY): Payer: Self-pay

## 2016-01-24 DIAGNOSIS — Z01812 Encounter for preprocedural laboratory examination: Secondary | ICD-10-CM | POA: Diagnosis not present

## 2016-01-24 DIAGNOSIS — R05 Cough: Secondary | ICD-10-CM | POA: Diagnosis not present

## 2016-01-24 DIAGNOSIS — C73 Malignant neoplasm of thyroid gland: Secondary | ICD-10-CM

## 2016-01-24 DIAGNOSIS — Z01810 Encounter for preprocedural cardiovascular examination: Secondary | ICD-10-CM | POA: Insufficient documentation

## 2016-01-24 DIAGNOSIS — E213 Hyperparathyroidism, unspecified: Secondary | ICD-10-CM | POA: Diagnosis not present

## 2016-01-24 HISTORY — DX: Pain in unspecified joint: M25.50

## 2016-01-24 HISTORY — DX: Personal history of other diseases of the respiratory system: Z87.09

## 2016-01-24 HISTORY — DX: Anxiety disorder, unspecified: F41.9

## 2016-01-24 HISTORY — DX: Personal history of urinary calculi: Z87.442

## 2016-01-24 HISTORY — DX: Personal history of colon polyps, unspecified: Z86.0100

## 2016-01-24 HISTORY — DX: Dorsalgia, unspecified: M54.9

## 2016-01-24 HISTORY — DX: Personal history of colonic polyps: Z86.010

## 2016-01-24 HISTORY — DX: Other hemorrhoids: K64.8

## 2016-01-24 LAB — COMPREHENSIVE METABOLIC PANEL
ALBUMIN: 4.1 g/dL (ref 3.5–5.0)
ALK PHOS: 48 U/L (ref 38–126)
ALT: 15 U/L (ref 14–54)
AST: 20 U/L (ref 15–41)
Anion gap: 8 (ref 5–15)
BILIRUBIN TOTAL: 0.3 mg/dL (ref 0.3–1.2)
BUN: 9 mg/dL (ref 6–20)
CALCIUM: 11.9 mg/dL — AB (ref 8.9–10.3)
CO2: 25 mmol/L (ref 22–32)
CREATININE: 0.88 mg/dL (ref 0.44–1.00)
Chloride: 106 mmol/L (ref 101–111)
GFR calc Af Amer: >60 mL/min (ref >60)
GFR calc non Af Amer: >60 mL/min (ref >60)
GLUCOSE: 96 mg/dL (ref 65–99)
Potassium: 3.8 mmol/L (ref 3.5–5.1)
Sodium: 139 mmol/L (ref 135–145)
TOTAL PROTEIN: 6.6 g/dL (ref 6.5–8.1)

## 2016-01-24 LAB — CBC
HCT: 36.5 % (ref 36.0–46.0)
Hemoglobin: 12 g/dL (ref 12.0–15.0)
MCH: 30.6 pg (ref 26.0–34.0)
MCHC: 32.9 g/dL (ref 30.0–36.0)
MCV: 93.1 fL (ref 78.0–100.0)
Platelets: 187 10*3/uL (ref 150–400)
RBC: 3.92 MIL/uL (ref 3.87–5.11)
RDW: 13 % (ref 11.5–15.5)
WBC: 4.7 10*3/uL (ref 4.0–10.5)

## 2016-01-24 LAB — NO BLOOD PRODUCTS

## 2016-01-24 NOTE — Progress Notes (Addendum)
Cardiologist denies   [name] NP with [facility]  Echo > 10 yrs ago  Stress test > 10 yrs ago  Heart cath denies  EKG denies in past yr  CXR denies in past month

## 2016-01-24 NOTE — Pre-Procedure Instructions (Signed)
HALEA DANIELSEN  01/24/2016      [id number] [address], [place] [address] [phone] [phone]    Your procedure is scheduled on Thurs, Jan 25 @ 11:30 AM  Report to [facility] Admitting at 9:30 AM  Call this number if you have problems the morning of surgery:  [phone]   Remember:  Do not eat food or drink liquids after midnight.  Take these medicines the morning of surgery with A SIP OF WATER Tramadol(Ultram-if needed)             Stop taking your Omega 3,Naproxen,along with any other Vitamins or Herbal Medications now. No Goody's,BC's,Advil,Motrin,or Ibuprofen.    Do not wear jewelry, make-up or nail polish.  Do not wear lotions, powders,perfumes, or deoderant.  Do not shave 48 hours prior to surgery.    Do not bring valuables to the hospital.  [facility] is not responsible for any belongings or valuables.  Contacts, dentures or bridgework may not be worn into surgery.  Leave your suitcase in the car.  After surgery it may be brought to your room.  For patients admitted to the hospital, discharge time will be determined by your treatment team.  Patients discharged the day of surgery will not be allowed to drive home.    [facility]  Before surgery, you can play an important role.  Because skin is not sterile, your skin needs to be as free of germs as possible.  You can reduce the number of germs on you skin by washing with CHG (chlorahexidine gluconate) soap before surgery.  CHG is an antiseptic cleaner which kills germs and bonds with the skin to continue killing germs even after washing.  Please DO NOT use if you have an allergy to CHG or antibacterial soaps.  If your skin becomes reddened/irritated stop using the CHG and inform your nurse when you arrive at Short Stay.  Do not shave (including legs and underarms) for at least 48 hours  prior to the first CHG shower.  You may shave your face.  Please follow these instructions carefully:   1.  Shower with CHG Soap the night before surgery and the                                morning of Surgery.  2.  If you choose to wash your hair, wash your hair first as usual with your       normal shampoo.  3.  After you shampoo, rinse your hair and body thoroughly to remove the                      Shampoo.  4.  Use CHG as you would any other liquid soap.  You can apply chg directly       to the skin and wash gently with scrungie or a clean washcloth.  5.  Apply the CHG Soap to your body ONLY FROM THE NECK DOWN.        Do not use on open wounds or open sores.  Avoid contact with your eyes,       ears, mouth and genitals (private parts).  Wash genitals (private parts)       with your normal soap.  6.  Wash thoroughly, paying special attention to the area where  your surgery        will be performed.  7.  Thoroughly rinse your body with warm water from the neck down.  8.  DO NOT shower/wash with your normal soap after using and rinsing off       the CHG Soap.  9.  Pat yourself dry with a clean towel.            10.  Wear clean pajamas.            11.  Place clean sheets on your bed the night of your first shower and do not        sleep with pets.  Day of Surgery  Do not apply any lotions/deoderants the morning of surgery.  Please wear clean clothes to the hospital/surgery center.     Please read over the following fact sheets that you were given. Pain Booklet, Coughing and Deep Breathing and Surgical Site Infection Prevention

## 2016-01-28 ENCOUNTER — Ambulatory Visit: Payer: Commercial Managed Care - HMO

## 2016-01-28 ENCOUNTER — Encounter: Payer: Commercial Managed Care - HMO | Admitting: Nurse Practitioner

## 2016-01-31 ENCOUNTER — Encounter (HOSPITAL_COMMUNITY): Payer: Self-pay | Admitting: *Deleted

## 2016-01-31 ENCOUNTER — Encounter (HOSPITAL_COMMUNITY)
Admission: RE | Disposition: A | Discharge status: Home / Self Care | Payer: Self-pay | Source: Ambulatory Visit | Attending: Otolaryngology

## 2016-01-31 ENCOUNTER — Observation Stay (HOSPITAL_COMMUNITY)
Admission: RE | Admit: 2016-01-31 | Discharge: 2016-02-01 | Disposition: A | Discharge status: Home / Self Care | Payer: Medicare HMO | Source: Ambulatory Visit | Attending: Otolaryngology | Admitting: Otolaryngology

## 2016-01-31 ENCOUNTER — Ambulatory Visit (HOSPITAL_COMMUNITY): Payer: Medicare HMO | Admitting: Certified Registered"

## 2016-01-31 DIAGNOSIS — E7800 Pure hypercholesterolemia, unspecified: Secondary | ICD-10-CM | POA: Insufficient documentation

## 2016-01-31 DIAGNOSIS — J383 Other diseases of vocal cords: Secondary | ICD-10-CM | POA: Insufficient documentation

## 2016-01-31 DIAGNOSIS — Z21 Asymptomatic human immunodeficiency virus [HIV] infection status: Secondary | ICD-10-CM | POA: Insufficient documentation

## 2016-01-31 DIAGNOSIS — C73 Malignant neoplasm of thyroid gland: Secondary | ICD-10-CM | POA: Diagnosis not present

## 2016-01-31 DIAGNOSIS — E049 Nontoxic goiter, unspecified: Secondary | ICD-10-CM | POA: Diagnosis not present

## 2016-01-31 DIAGNOSIS — I10 Essential (primary) hypertension: Secondary | ICD-10-CM | POA: Diagnosis not present

## 2016-01-31 DIAGNOSIS — D351 Benign neoplasm of parathyroid gland: Secondary | ICD-10-CM | POA: Insufficient documentation

## 2016-01-31 DIAGNOSIS — M1990 Unspecified osteoarthritis, unspecified site: Secondary | ICD-10-CM | POA: Diagnosis not present

## 2016-01-31 DIAGNOSIS — F418 Other specified anxiety disorders: Secondary | ICD-10-CM | POA: Insufficient documentation

## 2016-01-31 DIAGNOSIS — R51 Headache: Secondary | ICD-10-CM | POA: Insufficient documentation

## 2016-01-31 DIAGNOSIS — D352 Benign neoplasm of pituitary gland: Secondary | ICD-10-CM | POA: Diagnosis not present

## 2016-01-31 DIAGNOSIS — Z87442 Personal history of urinary calculi: Secondary | ICD-10-CM | POA: Insufficient documentation

## 2016-01-31 DIAGNOSIS — E213 Hyperparathyroidism, unspecified: Secondary | ICD-10-CM | POA: Diagnosis not present

## 2016-01-31 DIAGNOSIS — E785 Hyperlipidemia, unspecified: Secondary | ICD-10-CM | POA: Diagnosis not present

## 2016-01-31 HISTORY — PX: TOTAL THYROIDECTOMY: SHX2547

## 2016-01-31 HISTORY — PX: THYROIDECTOMY: SHX17

## 2016-01-31 HISTORY — DX: Reserved for inherently not codable concepts without codable children: IMO0001

## 2016-01-31 HISTORY — DX: Migraine, unspecified, not intractable, without status migrainosus: G43.909

## 2016-01-31 HISTORY — DX: Thyrotoxicosis, unspecified without thyrotoxic crisis or storm: E05.90

## 2016-01-31 HISTORY — DX: Hypothyroidism, unspecified: E03.9

## 2016-01-31 HISTORY — DX: Procedure and treatment not carried out because of patient's decision for reasons of belief and group pressure: Z53.1

## 2016-01-31 HISTORY — DX: Other chronic pain: G89.29

## 2016-01-31 HISTORY — DX: Unspecified osteoarthritis, unspecified site: M19.90

## 2016-01-31 HISTORY — PX: PARATHYROIDECTOMY: SHX19

## 2016-01-31 HISTORY — DX: Anemia, unspecified: D64.9

## 2016-01-31 HISTORY — DX: Cervicalgia: M54.2

## 2016-01-31 LAB — CALCIUM: Calcium: 10.1 mg/dL (ref 8.9–10.3)

## 2016-01-31 SURGERY — THYROIDECTOMY
Anesthesia: General | Site: Neck | Laterality: Right

## 2016-01-31 MED ORDER — LIDOCAINE HCL (CARDIAC) 20 MG/ML IV SOLN
INTRAVENOUS | Status: DC | PRN
  Administered 2016-01-31: 100 mg via INTRAVENOUS
  Administered 2016-01-31: 60 mg via INTRAVENOUS

## 2016-01-31 MED ORDER — HYDROMORPHONE HCL 1 MG/ML IJ SOLN
0.2500 mg | INTRAMUSCULAR | Status: DC | PRN
  Administered 2016-01-31 (×2): 0.5 mg via INTRAVENOUS

## 2016-01-31 MED ORDER — LEVOTHYROXINE SODIUM 100 MCG PO TABS
100.0000 ug | ORAL_TABLET | Freq: Every day | ORAL | Status: DC
  Administered 2016-02-01: 100 ug via ORAL
  Filled 2016-01-31: qty 1

## 2016-01-31 MED ORDER — HYDROCHLOROTHIAZIDE 12.5 MG PO CAPS
12.5000 mg | ORAL_CAPSULE | Freq: Every day | ORAL | Status: DC
  Administered 2016-01-31 – 2016-02-01 (×2): 12.5 mg via ORAL
  Filled 2016-01-31 (×2): qty 1

## 2016-01-31 MED ORDER — LACTATED RINGERS IV SOLN
INTRAVENOUS | Status: DC
  Administered 2016-01-31 (×2): via INTRAVENOUS

## 2016-01-31 MED ORDER — ROCURONIUM BROMIDE 100 MG/10ML IV SOLN
INTRAVENOUS | Status: DC | PRN
  Administered 2016-01-31: 50 mg via INTRAVENOUS

## 2016-01-31 MED ORDER — DEXAMETHASONE SODIUM PHOSPHATE 10 MG/ML IJ SOLN
INTRAMUSCULAR | Status: DC | PRN
  Administered 2016-01-31: 10 mg via INTRAVENOUS

## 2016-01-31 MED ORDER — MIDAZOLAM HCL 5 MG/5ML IJ SOLN
INTRAMUSCULAR | Status: DC | PRN
  Administered 2016-01-31: 2 mg via INTRAVENOUS

## 2016-01-31 MED ORDER — CEFAZOLIN SODIUM 1 G IJ SOLR
INTRAMUSCULAR | Status: DC | PRN
  Administered 2016-01-31: 2 g via INTRAMUSCULAR

## 2016-01-31 MED ORDER — PHENYLEPHRINE HCL 10 MG/ML IJ SOLN
INTRAMUSCULAR | Status: DC | PRN
  Administered 2016-01-31: 80 ug via INTRAVENOUS
  Administered 2016-01-31: 120 ug via INTRAVENOUS
  Administered 2016-01-31: 80 ug via INTRAVENOUS

## 2016-01-31 MED ORDER — ONDANSETRON HCL 4 MG/2ML IJ SOLN
INTRAMUSCULAR | Status: DC | PRN
  Administered 2016-01-31: 4 mg via INTRAVENOUS

## 2016-01-31 MED ORDER — TRAMADOL HCL 50 MG PO TABS
50.0000 mg | ORAL_TABLET | Freq: Four times a day (QID) | ORAL | Status: DC
  Administered 2016-01-31 (×2): 50 mg via ORAL
  Administered 2016-02-01: 100 mg via ORAL
  Administered 2016-02-01: 50 mg via ORAL
  Filled 2016-01-31 (×4): qty 1

## 2016-01-31 MED ORDER — FENTANYL CITRATE (PF) 100 MCG/2ML IJ SOLN
INTRAMUSCULAR | Status: AC
  Filled 2016-01-31: qty 4

## 2016-01-31 MED ORDER — PROPOFOL 10 MG/ML IV BOLUS
INTRAVENOUS | Status: AC
  Filled 2016-01-31: qty 20

## 2016-01-31 MED ORDER — MUSCLE RUB 10-15 % EX CREA
1.0000 "application " | TOPICAL_CREAM | Freq: Four times a day (QID) | CUTANEOUS | Status: DC | PRN
  Filled 2016-01-31: qty 85

## 2016-01-31 MED ORDER — IRBESARTAN 150 MG PO TABS
150.0000 mg | ORAL_TABLET | Freq: Every day | ORAL | Status: DC
  Administered 2016-01-31 – 2016-02-01 (×2): 150 mg via ORAL
  Filled 2016-01-31 (×2): qty 1

## 2016-01-31 MED ORDER — LEVOTHYROXINE SODIUM 100 MCG PO TABS: 100.0000 ug | ORAL_TABLET | Freq: Every day | ORAL | 6 refills | Status: DC

## 2016-01-31 MED ORDER — OLMESARTAN MEDOXOMIL-HCTZ 20-12.5 MG PO TABS: 1.0000 | ORAL_TABLET | Freq: Every day | ORAL | Status: DC

## 2016-01-31 MED ORDER — NAPROXEN 250 MG PO TABS
250.0000 mg | ORAL_TABLET | Freq: Every day | ORAL | Status: DC
  Filled 2016-01-31: qty 1

## 2016-01-31 MED ORDER — PHENYLEPHRINE HCL 10 MG/ML IJ SOLN
INTRAMUSCULAR | Status: DC | PRN
  Administered 2016-01-31: 30 ug/min via INTRAVENOUS

## 2016-01-31 MED ORDER — SUGAMMADEX SODIUM 200 MG/2ML IV SOLN
INTRAVENOUS | Status: AC
  Filled 2016-01-31: qty 2

## 2016-01-31 MED ORDER — MIDAZOLAM HCL 2 MG/2ML IJ SOLN
INTRAMUSCULAR | Status: AC
  Filled 2016-01-31: qty 2

## 2016-01-31 MED ORDER — HYDROCODONE-ACETAMINOPHEN 5-325 MG PO TABS
1.0000 | ORAL_TABLET | ORAL | Status: DC | PRN
  Administered 2016-01-31: 2 via ORAL
  Administered 2016-01-31: 1 via ORAL
  Administered 2016-02-01: 2 via ORAL
  Filled 2016-01-31 (×2): qty 2

## 2016-01-31 MED ORDER — PROMETHAZINE HCL 25 MG PO TABS
12.5000 mg | ORAL_TABLET | Freq: Four times a day (QID) | ORAL | Status: DC | PRN
Start: 2016-01-31 — End: 2016-02-01

## 2016-01-31 MED ORDER — DEXTROSE-NACL 5-0.9 % IV SOLN
INTRAVENOUS | Status: DC
  Administered 2016-01-31 – 2016-02-01 (×2): via INTRAVENOUS

## 2016-01-31 MED ORDER — HYDROMORPHONE HCL 1 MG/ML IJ SOLN
INTRAMUSCULAR | Status: AC
  Administered 2016-01-31: 0.5 mg via INTRAVENOUS
  Filled 2016-01-31: qty 1

## 2016-01-31 MED ORDER — HYDROCODONE-ACETAMINOPHEN 7.5-325 MG PO TABS: 1.0000 | ORAL_TABLET | Freq: Four times a day (QID) | ORAL | 0 refills | Status: DC | PRN

## 2016-01-31 MED ORDER — OMEGA-3-ACID ETHYL ESTERS 1 G PO CAPS
1.0000 | ORAL_CAPSULE | Freq: Every day | ORAL | Status: DC
  Administered 2016-01-31 – 2016-02-01 (×2): 1 g via ORAL
  Filled 2016-01-31 (×2): qty 1

## 2016-01-31 MED ORDER — ONDANSETRON HCL 4 MG/2ML IJ SOLN
INTRAMUSCULAR | Status: AC
  Filled 2016-01-31: qty 2

## 2016-01-31 MED ORDER — FENTANYL CITRATE (PF) 100 MCG/2ML IJ SOLN
INTRAMUSCULAR | Status: DC | PRN
  Administered 2016-01-31 (×4): 50 ug via INTRAVENOUS

## 2016-01-31 MED ORDER — PROPOFOL 10 MG/ML IV BOLUS
INTRAVENOUS | Status: DC | PRN
  Administered 2016-01-31: 140 mg via INTRAVENOUS

## 2016-01-31 MED ORDER — LIDOCAINE 2% (20 MG/ML) 5 ML SYRINGE
INTRAMUSCULAR | Status: AC
  Filled 2016-01-31: qty 5

## 2016-01-31 MED ORDER — PRAVASTATIN SODIUM 10 MG PO TABS
10.0000 mg | ORAL_TABLET | Freq: Every day | ORAL | Status: DC
  Administered 2016-01-31: 10 mg via ORAL
  Filled 2016-01-31: qty 1

## 2016-01-31 MED ORDER — HYDROCODONE-ACETAMINOPHEN 5-325 MG PO TABS
ORAL_TABLET | ORAL | Status: AC
  Administered 2016-01-31: 1 via ORAL
  Filled 2016-01-31: qty 1

## 2016-01-31 MED ORDER — EMTRICITAB-RILPIVIR-TENOFOV AF 200-25-25 MG PO TABS
1.0000 | ORAL_TABLET | Freq: Every day | ORAL | Status: DC
Start: 2016-02-01 — End: 2016-02-01
  Administered 2016-02-01: 1 via ORAL
  Filled 2016-01-31: qty 1

## 2016-01-31 MED ORDER — 0.9 % SODIUM CHLORIDE (POUR BTL) OPTIME
TOPICAL | Status: DC | PRN
  Administered 2016-01-31: 1000 mL

## 2016-01-31 MED ORDER — SUGAMMADEX SODIUM 200 MG/2ML IV SOLN
INTRAVENOUS | Status: DC | PRN
  Administered 2016-01-31: 130 mg via INTRAVENOUS

## 2016-01-31 SURGICAL SUPPLY — 44 items
ADH SKN CLS APL DERMABOND .7 (GAUZE/BANDAGES/DRESSINGS) ×2
BLADE SURG 15 STRL LF DISP TIS (BLADE) IMPLANT
BLADE SURG 15 STRL SS (BLADE)
CANISTER SUCTION 2500CC (MISCELLANEOUS) ×3 IMPLANT
CLEANER TIP ELECTROSURG 2X2 (MISCELLANEOUS) ×3 IMPLANT
CONT SPEC 4OZ CLIKSEAL STRL BL (MISCELLANEOUS) ×3 IMPLANT
CORDS BIPOLAR (ELECTRODE) ×3 IMPLANT
COVER SURGICAL LIGHT HANDLE (MISCELLANEOUS) ×3 IMPLANT
DERMABOND ADVANCED (GAUZE/BANDAGES/DRESSINGS) ×1
DERMABOND ADVANCED .7 DNX12 (GAUZE/BANDAGES/DRESSINGS) ×2 IMPLANT
DRAIN HEMOVAC 7FR (DRAIN) IMPLANT
DRAIN SNY 10 ROU (WOUND CARE) ×1 IMPLANT
ELECT COATED BLADE 2.86 ST (ELECTRODE) ×3 IMPLANT
ELECT REM PT RETURN 9FT ADLT (ELECTROSURGICAL) ×3
ELECTRODE REM PT RTRN 9FT ADLT (ELECTROSURGICAL) ×2 IMPLANT
EVACUATOR SILICONE 100CC (DRAIN) ×3 IMPLANT
FORCEPS BIPOLAR SPETZLER 8 1.0 (NEUROSURGERY SUPPLIES) ×3 IMPLANT
GAUZE SPONGE 4X4 16PLY XRAY LF (GAUZE/BANDAGES/DRESSINGS) IMPLANT
GLOVE BIO SURGEON STRL SZ 6.5 (GLOVE) IMPLANT
GLOVE BIO SURGEON STRL SZ7 (GLOVE) ×1 IMPLANT
GLOVE BIO SURGEON STRL SZ7.5 (GLOVE) ×1 IMPLANT
GLOVE BIO SURGEON STRL SZ8 (GLOVE) ×1 IMPLANT
GLOVE BIOGEL PI IND STRL 7.0 (GLOVE) IMPLANT
GLOVE BIOGEL PI INDICATOR 7.0 (GLOVE) ×1
GLOVE ECLIPSE 7.5 STRL STRAW (GLOVE) ×3 IMPLANT
GOWN STRL REUS W/ TWL LRG LVL3 (GOWN DISPOSABLE) ×4 IMPLANT
GOWN STRL REUS W/ TWL XL LVL3 (GOWN DISPOSABLE) IMPLANT
GOWN STRL REUS W/TWL LRG LVL3 (GOWN DISPOSABLE) ×12
GOWN STRL REUS W/TWL XL LVL3 (GOWN DISPOSABLE) ×6
KIT BASIN OR (CUSTOM PROCEDURE TRAY) ×3 IMPLANT
KIT ROOM TURNOVER OR (KITS) ×3 IMPLANT
NDL PRECISIONGLIDE 27X1.5 (NEEDLE) ×2 IMPLANT
NEEDLE PRECISIONGLIDE 27X1.5 (NEEDLE) ×3 IMPLANT
NS IRRIG 1000ML POUR BTL (IV SOLUTION) ×3 IMPLANT
PAD ARMBOARD 7.5X6 YLW CONV (MISCELLANEOUS) ×6 IMPLANT
PENCIL FOOT CONTROL (ELECTRODE) ×3 IMPLANT
SHEARS HARMONIC 9CM CVD (BLADE) ×3 IMPLANT
STAPLER VISISTAT 35W (STAPLE) ×3 IMPLANT
SUT CHROMIC 3 0 PS 2 (SUTURE) ×1 IMPLANT
SUT CHROMIC 4 0 PS 2 18 (SUTURE) ×6 IMPLANT
SUT ETHILON 3 0 PS 1 (SUTURE) ×4 IMPLANT
SUT SILK 4 0 REEL (SUTURE) ×3 IMPLANT
TOWEL OR 17X24 6PK STRL BLUE (TOWEL DISPOSABLE) ×3 IMPLANT
TRAY ENT MC OR (CUSTOM PROCEDURE TRAY) ×3 IMPLANT

## 2016-01-31 NOTE — Discharge Instructions (Signed)
You may shower and use soap and water. Do not use any creams, oils or ointment. ° °

## 2016-01-31 NOTE — Interval H&P Note (Signed)
History and Physical Interval Note:  01/31/2016 11:05 AM  Laurie Gray  has presented today for surgery, with the diagnosis of THYROID CARCINOMA,PARATHYROID ADENOMA  The various methods of treatment have been discussed with the patient and family. After consideration of risks, benefits and other options for treatment, the patient has consented to  Procedure(s): TOTAL THYROIDECTOMY (N/A) PARATHYROIDECTOMY ON RIGHT SIDE (Right) as a surgical intervention .  The patient's history has been reviewed, patient examined, no change in status, stable for surgery.  I have reviewed the patient's chart and labs.  Questions were answered to the patient's satisfaction.     Sidharth Leverette

## 2016-01-31 NOTE — Transfer of Care (Signed)
Immediate Anesthesia Transfer of Care Note  Patient: Laurie Gray  Procedure(s) Performed: Procedure(s): TOTAL THYROIDECTOMY (N/A) PARATHYROIDECTOMY ON RIGHT SIDE (Right)  Patient Location: PACU  Anesthesia Type:General  Level of Consciousness: awake, alert , oriented and patient cooperative  Airway & Oxygen Therapy: Patient Spontanous Breathing  Post-op Assessment: Report given to RN and Post -op Vital signs reviewed and stable  Post vital signs: Reviewed and stable  Last Vitals:  Vitals:   01/31/16 0916 01/31/16 1320  BP: (!) 155/91 (!) 148/71  Pulse: 66 80  Resp: 18 20  Temp: 36.8 C 36.4 C    Last Pain:  Vitals:   01/31/16 1320  TempSrc:   PainSc: (P) 0-No pain         Complications: No apparent anesthesia complications

## 2016-01-31 NOTE — Anesthesia Procedure Notes (Signed)
Procedure Name: Intubation Date/Time: 01/31/2016 11:41 AM Performed by: Lance Coon Pre-anesthesia Checklist: Patient identified, Emergency Drugs available, Patient being monitored, Timeout performed and Suction available Patient Re-evaluated:Patient Re-evaluated prior to inductionOxygen Delivery Method: Circle system utilized Preoxygenation: Pre-oxygenation with 100% oxygen Intubation Type: IV induction Ventilation: Mask ventilation without difficulty Laryngoscope Size: Miller and 3 Grade View: Grade II Tube type: Oral Tube size: 7.0 mm Number of attempts: 1 Airway Equipment and Method: Stylet Placement Confirmation: ETT inserted through vocal cords under direct vision,  positive ETCO2 and breath sounds checked- equal and bilateral Secured at: 21 cm Tube secured with: Tape Dental Injury: Teeth and Oropharynx as per pre-operative assessment

## 2016-01-31 NOTE — Anesthesia Postprocedure Evaluation (Addendum)
Anesthesia Post Note  Patient: Laurie Gray  Procedure(s) Performed: Procedure(s) (LRB): TOTAL THYROIDECTOMY (N/A) PARATHYROIDECTOMY ON RIGHT SIDE (Right)  Patient location during evaluation: PACU Anesthesia Type: General Level of consciousness: awake and alert Pain management: pain level controlled Vital Signs Assessment: post-procedure vital signs reviewed and stable Respiratory status: spontaneous breathing, nonlabored ventilation, respiratory function stable and patient connected to nasal cannula oxygen Cardiovascular status: blood pressure returned to baseline and stable Postop Assessment: no signs of nausea or vomiting Anesthetic complications: no       Last Vitals:  Vitals:   01/31/16 0916 01/31/16 1320  BP: (!) 155/91 (!) 148/71  Pulse: 66 80  Resp: 18 20  Temp: 36.8 C 36.4 C    Last Pain:  Vitals:   01/31/16 1336  TempSrc:   PainSc: 10-Worst pain ever                 Merrill Deanda S

## 2016-01-31 NOTE — Anesthesia Preprocedure Evaluation (Addendum)
Anesthesia Evaluation  Patient identified by MRN, date of birth, ID band Patient awake    Reviewed: Allergy & Precautions, H&P , NPO status , Patient's Chart, lab work & pertinent test results  Airway Mallampati: II  TM Distance: >3 FB Neck ROM: Full    Dental no notable dental hx. (+) Teeth Intact, Dental Advisory Given   Pulmonary neg pulmonary ROS,    Pulmonary exam normal breath sounds clear to auscultation       Cardiovascular hypertension, Pt. on medications  Rhythm:Regular Rate:Normal     Neuro/Psych  Headaches, Anxiety Depression    GI/Hepatic negative GI ROS, Neg liver ROS,   Endo/Other  negative endocrine ROS  Renal/GU negative Renal ROS  negative genitourinary   Musculoskeletal  (+) Arthritis , Osteoarthritis,    Abdominal   Peds  Hematology  (+) HIV,   Anesthesia Other Findings   Reproductive/Obstetrics negative OB ROS                            Anesthesia Physical Anesthesia Plan  ASA: II  Anesthesia Plan: General   Post-op Pain Management:    Induction: Intravenous  Airway Management Planned: Oral ETT  Additional Equipment:   Intra-op Plan:   Post-operative Plan: Extubation in OR  Informed Consent: I have reviewed the patients History and Physical, chart, labs and discussed the procedure including the risks, benefits and alternatives for the proposed anesthesia with the patient or authorized representative who has indicated his/her understanding and acceptance.   Dental advisory given  Plan Discussed with: CRNA  Anesthesia Plan Comments:        Anesthesia Quick Evaluation

## 2016-01-31 NOTE — Op Note (Signed)
OPERATIVE REPORT  DATE OF SURGERY: 01/31/2016  PATIENT:  Laurie Gray,  61 y.o. female  PRE-OPERATIVE DIAGNOSIS:  THYROID CARCINOMA,PARATHYROID ADENOMA  POST-OPERATIVE DIAGNOSIS:  THYROID CARCINOMA, PARATHYROID ADENOMA  PROCEDURE:  Procedure(s): TOTAL THYROIDECTOMY PARATHYROIDECTOMY ON RIGHT SIDE  SURGEON:  Beckie Salts, MD  ASSISTANTS: Melida Quitter, M.D.  ANESTHESIA:   General   EBL:  30 ml  DRAINS: 10 French round  LOCAL MEDICATIONS USED:  None  SPECIMEN:  1. Total thyroidectomy, suture marks right lobe; 2. Right parathyroid  COUNTS:  Correct  PROCEDURE DETAILS: The patient was taken to the operating room and placed on the operating table in the supine position. A shoulder roll was placed beneath the shoulder blades and the neck was extended. The neck was prepped and draped in a standard fashion. A low collar transverse incision was outlined with a marking pen and was incised with electrocautery . Dissection was continued down through the platysma layer.  Self-retaining retractors were used throughout the case.  The midline fascia was divided. The thyroid was identified. The left lobe was dissected first. The lobe was reflected medially while the superior vasculature were separately identified, ligated between clamps and divided. The harmonic dissector was used throughout the case. For larger caliber vessels 4-0 silk ties were used. The left lobe was enlarged with some firm nodules. The middle thyroid vein was ligated and divided. The inferior vasculature was treated in a similar fashion. During the dissection, 2 parathyroids were identified on the left and were preserved with their blood supply. The recurrent nerve was identified and preserved as well. Berry's ligament was divided and the gland was brought off of the trachea.  The right side was dissected in a similar fashion. The right side was much larger with multiple firm nodules. The superior and inferior vasculature was  treated in a similar fashion. The recurrent nerve was identified and preserved. Parathyroids were not identified on the right.  After the gland was removed entirely, the course of the right recurrent nerve was dissected and ultimately a parathyroid adenoma was discovered about midway down along the course of the nerve, in the thyroid bed, posterior to the fascia behind the thyroid gland. This was approximately 12 x 6 mm. This was sent for frozen section analysis which confirmed that it was parathyroid tissue. No other glands were identified. There were no visible or palpable masses otherwise. The central compartment was also free of any palpable masses.  The wound was irrigated with saline. Hemostasis was confirmed. The drain was secured in place with a nylon suture. The midline fascia was reapproximated with interrupted chromic suture. The platysma layer was also reapproximated with interrupted chromic suture. A running subcuticular closure was accomplished. Dermabond was used on the skin. The drain was charged. The patient was awakened, extubated and transferred to recovery in stable condition.   PATIENT DISPOSITION:  To PACU, stable

## 2016-01-31 NOTE — H&P (View-Only) (Signed)
Laurie Gray is a 61 y.o. female who presents as a consult Patient.   Referring Provider: Jackolyn Confer, MD  Chief complaint: Thyroid cancer, parathyroid adenoma.  HPI: History of hypercalcemia for many years. Recent evaluation with sestamibi scan revealed a right inferior parathyroid adenoma. 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 History of HIV infection, stable. History of small pituitary adenoma. Symptoms and history include kidney stones, fatigue, muscle aches, bone pain. Additional history of spasmodic dysphonia. She has not had Botox injections for several years and has been doing well.  PMH/Meds/All/SocHx/FamHx/ROS:   Past Medical History:  Diagnosis Date  . Arthritis  . Cataract  . Elevated cholesterol  . HIV (human immunodeficiency virus infection) ([place])  . Hypertension   Past Surgical History:  Procedure Laterality Date  . FOOT SURGERY  . HYSTERECTOMY  . TONSILLECTOMY   No family history of bleeding disorders, wound healing problems or difficulty with anesthesia.   Social History   Social History  . Marital status: Single  Spouse name: N/A  . Number of children: N/A  . Years of education: N/A   Occupational History  . Not on file.   Social History Main Topics  . Smoking status: Never Smoker  . Smokeless tobacco: Never Used  . Alcohol use Not on file  . Drug use: Unknown  . Sexual activity: Not on file   Other Topics Concern  . Not on file   Social History Narrative  . No narrative on file   Current Outpatient Prescriptions:  . BENICAR HCT 20-12.5 mg per tablet, , Disp: , Rfl:  . ODEFSEY 200-25-25 mg per tablet, TAKE 1 TABLET BY MOUTH DAILY WITH BREAKFAST. (7/5 WAITING FOR PRIOR AUTH), Disp: , Rfl: 3 . omega-3 acid ethyl esters (LOVAZA) 1 gram capsule, TAKE 1 CAPSULE EVERY DAY, Disp: , Rfl: 3 . pravastatin (PRAVACHOL) 40 MG tablet, TAKE 1 TABLET (40 MG TOTAL) BY MOUTH DAILY., Disp: , Rfl: 1 . traMADol  (ULTRAM) 50 mg tablet, , Disp: , Rfl:   A complete ROS was performed with pertinent positives/negatives noted in the HPI. The remainder of the ROS are negative.   Physical Exam:   BP 118/75 (Site: Left arm)  Wt 53.5 kg (118 lb)  BMI 19.64 kg/m   General: Healthy and alert, in no distress, breathing easily. Normal affect. In a pleasant mood. Head: Normocephalic, atraumatic. No masses, or scars. Eyes: Pupils are equal, and reactive to light. Vision is grossly intact. No spontaneous or gaze nystagmus. Ears: Ear canals are clear. Tympanic membranes are intact, with normal landmarks and the middle ears are clear and healthy. Hearing: Grossly normal. Nose: Nasal cavities are clear with healthy mucosa, no polyps or exudate.Airways are patent. Face: No masses or scars, facial nerve function is symmetric. Oral Cavity: No mucosal abnormalities are noted. Tongue with normal mobility. Dentition appears healthy. Oropharynx: Tonsils are symmetric. There are no mucosal masses identified. Tongue base appears normal and healthy. Larynx/Hypopharynx: indirect exam reveals healthy, mobile vocal cords, without mucosal lesions in the hypopharynx or larynx. Chest: Deferred Neck: There is no palpable cervical adenopathy. There is a palpable mass in the right anterior thyroid, approximately 6 cm. Neuro: Cranial nerves II-XII will normal function. Balance: Normal gate. Other findings: none.  Independent Review of Additional Tests or Records:  none  Procedures:  none  Impression & Plans:  Right side papillary thyroid carcinoma, right side parathyroid adenoma. Recommend total thyroidectomy, parathyroid exploration and parathyroidectomy on the right. Risks and benefits were  discussed including persistent hypercalcemia, hypocalcemia, recurrent nerve injury, possible need for tracheostomy. All questions were answered. We will schedule at her convenience.

## 2016-01-31 NOTE — Progress Notes (Signed)
   Subjective:    Patient ID: Laurie Gray, female    DOB: Apr 09, 1955, 61 y.o.   MRN: [id number]  HPI She feels pretty good without much pain.  Her voice sounds pretty good to her.    Review of Systems     Objective:   Physical Exam Alert NAD. Voice slightly breathy. Thyroid incision clean and intact, drain functioning.      Assessment & Plan:  S/p total thyroidectomy for papillary carcinoma and right parathyroidectomy for parathyroid adenoma  Doing well.  Plan overnight observation with drain in place.

## 2016-02-01 ENCOUNTER — Encounter (HOSPITAL_COMMUNITY): Payer: Self-pay | Admitting: Otolaryngology

## 2016-02-01 DIAGNOSIS — E7800 Pure hypercholesterolemia, unspecified: Secondary | ICD-10-CM | POA: Diagnosis not present

## 2016-02-01 DIAGNOSIS — D351 Benign neoplasm of parathyroid gland: Secondary | ICD-10-CM | POA: Diagnosis not present

## 2016-02-01 DIAGNOSIS — D352 Benign neoplasm of pituitary gland: Secondary | ICD-10-CM | POA: Diagnosis not present

## 2016-02-01 DIAGNOSIS — I10 Essential (primary) hypertension: Secondary | ICD-10-CM | POA: Diagnosis not present

## 2016-02-01 DIAGNOSIS — M1990 Unspecified osteoarthritis, unspecified site: Secondary | ICD-10-CM | POA: Diagnosis not present

## 2016-02-01 DIAGNOSIS — J383 Other diseases of vocal cords: Secondary | ICD-10-CM | POA: Diagnosis not present

## 2016-02-01 DIAGNOSIS — E049 Nontoxic goiter, unspecified: Secondary | ICD-10-CM | POA: Diagnosis not present

## 2016-02-01 DIAGNOSIS — C73 Malignant neoplasm of thyroid gland: Secondary | ICD-10-CM | POA: Diagnosis not present

## 2016-02-01 LAB — CALCIUM
Calcium: 9.6 mg/dL (ref 8.9–10.3)
Calcium: 9.8 mg/dL (ref 8.9–10.3)

## 2016-02-01 NOTE — Progress Notes (Signed)
Patient discharged to home with instructions. 

## 2016-02-01 NOTE — Discharge Summary (Signed)
Physician Discharge Summary  Patient ID: Laurie Gray MRN: [id number] DOB/AGE: [birth year] 61 y.o.  Admit date: 01/31/2016 Discharge date: 02/01/2016  Admission Diagnoses:Thyroid Cancer, hyper parathyroid  Discharge Diagnoses:  Active Problems:   Thyroid cancer [facility])   Discharged Condition: good  Hospital Course: No complications, calcium has normalized.  Consults: none  Significant Diagnostic Studies: none  Treatments: surgery: Total thyroidectomy, right parathyroidectomy  Discharge Exam: Blood pressure 131/64, pulse 65, temperature 99.5 F (37.5 C), resp. rate 17, height 5\' 5"  (1.651 m), weight 60.9 kg (134 lb 3.2 oz), SpO2 100 %. PHYSICAL EXAM: Voice is normal. Neck looks excellent. JP removed.  Disposition: 01-Home or Self Care  Discharge Instructions    Diet - low sodium heart healthy    Complete by:  As directed    Increase activity slowly    Complete by:  As directed      Allergies as of 02/01/2016      Reactions   Sulfonamide Derivatives Swelling   SWELLING REACTION UNSPECIFIED       Medication List    TAKE these medications   GLUCOSAMINE PO Take 1 tablet by mouth daily.   HYDROcodone-acetaminophen 7.5-325 MG tablet Commonly known as:  NORCO Take 1 tablet by mouth every 6 (six) hours as needed for moderate pain.   ICY HOT ADVANCED RELIEF 16-11 % Crea Generic drug:  Menthol-Camphor Apply 1 application topically 4 (four) times daily as needed (for shoulder pain).   levothyroxine 100 MCG tablet Commonly known as:  SYNTHROID Take 1 tablet (100 mcg total) by mouth daily.   naproxen sodium 220 MG tablet Commonly known as:  ANAPROX Take 220 mg by mouth at bedtime.   ODEFSEY 200-25-25 MG Tabs tablet Generic drug:  emtricitabine-rilpivir-tenofovir AF TAKE 1 TABLET BY MOUTH DAILY WITH BREAKFAST. (7/5 WAITING FOR PRIOR AUTH)   olmesartan-hydrochlorothiazide 20-12.5 MG tablet Commonly known as:  BENICAR HCT TAKE 1 TABLET BY MOUTH DAILY.   omega-3  acid ethyl esters 1 g capsule Commonly known as:  LOVAZA TAKE 1 CAPSULE EVERY DAY   pravastatin 40 MG tablet Commonly known as:  PRAVACHOL Take one tablet by mouth once daily for cholesterol   promethazine 12.5 MG tablet Commonly known as:  PHENERGAN Take 12.5 mg by mouth every 6 (six) hours as needed for nausea or vomiting. Reported on 05/09/2015   traMADol 50 MG tablet Commonly known as:  ULTRAM TAKE 1/2 TO 1 TABLET BY MOUTH EVERY 12 HOURS AS NEEDED      Follow-up Information    Torrance Frech, MD. Schedule an appointment as soon as possible for a visit in 1 week.   Specialty:  Otolaryngology Contact information: [address] [place] [address] [phone]           Signed: Izora Gala 02/01/2016, 11:41 AM

## 2016-02-12 ENCOUNTER — Other Ambulatory Visit: Payer: Self-pay | Admitting: Internal Medicine

## 2016-02-12 DIAGNOSIS — C73 Malignant neoplasm of thyroid gland: Secondary | ICD-10-CM

## 2016-02-12 DIAGNOSIS — E559 Vitamin D deficiency, unspecified: Secondary | ICD-10-CM | POA: Diagnosis not present

## 2016-02-12 DIAGNOSIS — E039 Hypothyroidism, unspecified: Secondary | ICD-10-CM | POA: Diagnosis not present

## 2016-02-14 ENCOUNTER — Ambulatory Visit (INDEPENDENT_AMBULATORY_CARE_PROVIDER_SITE_OTHER): Payer: Medicare HMO | Admitting: Orthopedic Surgery

## 2016-02-15 ENCOUNTER — Other Ambulatory Visit: Payer: Self-pay | Admitting: Nurse Practitioner

## 2016-02-18 ENCOUNTER — Encounter (INDEPENDENT_AMBULATORY_CARE_PROVIDER_SITE_OTHER): Payer: Self-pay | Admitting: Orthopedic Surgery

## 2016-02-18 ENCOUNTER — Ambulatory Visit (INDEPENDENT_AMBULATORY_CARE_PROVIDER_SITE_OTHER): Payer: Medicare HMO | Admitting: Orthopedic Surgery

## 2016-02-18 VITALS — Ht 65.0 in | Wt 130.0 lb

## 2016-02-18 DIAGNOSIS — M542 Cervicalgia: Secondary | ICD-10-CM | POA: Insufficient documentation

## 2016-02-18 DIAGNOSIS — M25512 Pain in left shoulder: Secondary | ICD-10-CM

## 2016-02-18 DIAGNOSIS — M25511 Pain in right shoulder: Secondary | ICD-10-CM

## 2016-02-18 DIAGNOSIS — G8929 Other chronic pain: Secondary | ICD-10-CM

## 2016-02-18 NOTE — Progress Notes (Signed)
Office Visit Note   Patient: Laurie Gray           Date of Birth: 08-03-1955           MRN: [id number] Visit Date: 02/18/2016 Requested by: Lauree Chandler, NP [place], [place] [address] PCP: Lauree Chandler, NP  Subjective: Chief Complaint  Patient presents with  . Neck - Pain  . Right Shoulder - Pain  . Left Shoulder - Pain    HPI Laurie Gray is a 61 year old female with bilateral shoulder pain left worse than right along with neck pain and pain that radiates into the upper part of her back.  Been going on for a year.  Interestingly she had her thyroid into parathyroid glands removed 2 weeks ago for cancer.  She has multiple studies including a PET scan and iodine type scan pending for the next several weeks.  She's had this shoulder pain and proximal or muscle pain and tenderness for about a year.  She is to be going to the gym a lot and now she can't.  She takes Aleve in order to sleep.  Plain radiographs are reviewed and are pretty unremarkable as far as the shoulder goes.  His [place] to put her clothes on his hard for her to get her hand up her back.  She denies a history of trauma to the shoulders.  She doesn't report any definitive numbness and tingling but does report a soreness in the muscles which goes down both arms down to the forearm level.  Denies much in this in terms of symptoms which are similar in bilateral lower extremities.  Patient does have the potential for immunosuppression due to her HIV status.              Review of Systems All systems reviewed are negative as they relate to the chief complaint within the history of present illness.  Patient denies  fevers or chills.    Assessment & Plan: Visit Diagnoses:  1. Chronic right shoulder pain   2. Chronic left shoulder pain   3. Neck pain     Plan: Impression is bilateral shoulder pain left worse than right along with neck pain and upper back pain.  Been going on for over a year.  Some of the  muscle soreness may be related to her chronic hypercalcemia issues.  She may also have a frozen shoulder which is early affecting the left more than the right.  Her rotator cuff strength is pretty reasonable.  She does have some limited forward flexion and abduction past 90 but she can get past 90 in both shoulders.  Cuff strength is pretty reasonable.  Plan at this time is for MRI scan of neck and left shoulder.  Metastatic disease and be a concern along with frozen shoulder.  Rotator cuff pathology less likely.  Follow-Up Instructions: No Follow-up on file.   Orders:  No orders of the defined types were placed in this encounter.  No orders of the defined types were placed in this encounter.     Procedures: No procedures performed   Clinical Data: No additional findings.  Objective: Vital Signs: Ht 5\' 5"  (1.651 m)   Wt 130 lb (59 kg)   BMI 21.63 kg/m   Physical Exam   Constitutional: Patient appears well-developed HEENT:  Head: Normocephalic Eyes:EOM are normal Neck: Normal range of motion Cardiovascular: Normal rate Pulmonary/chest: Effort normal Neurologic: Patient is alert Skin: Skin is warm Psychiatric: Patient has  normal mood and affect    Ortho Exam or the exam demonstrates pretty reasonable cervical spine range of motion.  Well-healed surgical incision on the anterior aspect of her neck.  Shoulder range of motion otherwise intact intact except for above for flexion 90 and abduction 90.  External rotation at 15 of abduction bilaterally is to about 55.  Cuff strength pre-symmetric and intact with infraspinatus super space and subscap muscle testing.  Also palpation elicits some pain on both proximal arms.  No other masses lymph adenopathy or skin changes noted in the shoulder girdle regions.  Motor sensory function to the hand is intact.  Reflexes otherwise symmetric in the upper extremities about 2+ out of 4.  Specialty Comments:  No specialty comments  available.  Imaging: No results found.   PMFS History: Patient Active Problem List   Diagnosis Date Noted  . Chronic right shoulder pain 02/18/2016  . Chronic left shoulder pain 02/18/2016  . Neck pain 02/18/2016  . Thyroid cancer ([place]) 01/31/2016  . Papillary carcinoma of thyroid ([place]) 01/21/2016  . Status post surgery 05/29/2015  . Hammertoe 04/02/2015  . S/P bunionectomy 11/08/2014  . Arthritis 07/07/2014  . Osteopenia 04/19/2014  . Routine general medical examination at a health care facility 03/28/2014  . Depression 11/02/2013  . Hyperlipidemia 11/02/2013  . Vaginal dysplasia 04/28/2013  . Menopause 04/06/2013  . Vaginal atrophy 04/06/2013  . Right hip pain 08/23/2012  . Pain, dental 04/07/2012  . Vaginal bleeding 02/17/2012  . 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 Headache 12/01/2011  . Pharyngitis 07/21/2011  . Cellulitis 07/03/2010  . 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 Disorder of bone and cartilage 09/27/2009  . HAND PAIN, RIGHT 09/24/2009  . Acute URI 01/08/2009  . ESSENTIAL HYPERTENSION, BENIGN 07/27/2008  . BREAST MASS 07/03/2008  . Human immunodeficiency virus (HIV) disease ([place]) 04/03/2008  . HSV 04/03/2008  . 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 Anemia   . Anxiety    hx of-not on any meds  . Arthritis    "shoulders, arms, fingers, neck, back" (01/31/2016)  . Chronic cervical pain   . History of bronchitis   . History of colon polyps   . History of kidney stones   . HIV infection ([place]) dx'd 2000   takes odefsey  . Hyperlipidemia    takes Pravastatin daily  . Hypertension    takes Benicar HCT daily  . Hyperthyroidism   . Hypothyroidism   . Internal hemorrhoids   . Joint pain   . Migraine    "in the past" (01/31/2016)  . Pituitary adenoma ([place])   . Refusal of blood transfusions as patient is Jehov[employer]'s Witness    "but substitutes are fine". (01/31/2016)  . Thyroid cancer ([place]) 12/2015  . Upper back pain      Family History  Problem Relation Age of Onset  . Breast cancer Mother   . Hypertension Mother   . Cancer Mother   . Hyperlipidemia Mother   . Stroke Mother   . Colon cancer Neg Hx     Past Surgical History:  Procedure Laterality Date  . ABDOMINAL HYSTERECTOMY    . BUNIONECTOMY Right   . COLONOSCOPY    . CYSTOSCOPY    . HAMMER TOE SURGERY Left    "middle toe was broke; turned into hammertoe then repaired"  . HAMMER TOE SURGERY Bilateral    "both small toes"  . PARATHYROIDECTOMY Right 01/31/2016  . PARATHYROIDECTOMY Right 01/31/2016  Procedure: PARATHYROIDECTOMY ON RIGHT SIDE;  Surgeon: Izora Gala, MD;  Location: [place];  Service: ENT;  Laterality: Right;  . SALPINGOOPHORECTOMY Bilateral   . THYROIDECTOMY N/A 01/31/2016   Procedure: TOTAL THYROIDECTOMY;  Surgeon: Izora Gala, MD;  Location: [facility];  Service: ENT;  Laterality: N/A;  . TOE SURGERY     bunion  . TONSILLECTOMY     as a child  . TOTAL THYROIDECTOMY  01/31/2016  . TUBAL LIGATION     30 yrs ago   Social History   Occupational History  . Not on file.   Social History Main Topics  . Smoking status: Never Smoker  . Smokeless tobacco: Never Used  . Alcohol use 0.0 oz/week     Comment: 01/31/2016 "might go out to eat and have a drink once/month; no more than that"  . Drug use: No  . Sexual activity: Not Currently    Partners: Male     Comment: pt. given condoms. 1st intercourse- 16, partners- greater than 5

## 2016-02-22 ENCOUNTER — Telehealth (INDEPENDENT_AMBULATORY_CARE_PROVIDER_SITE_OTHER): Payer: Self-pay | Admitting: *Deleted

## 2016-02-22 NOTE — Telephone Encounter (Signed)
Pt called states is having some severe pain in her neck and shoulders and can not sleep at night d/t the pain, is taking Naproxen but after 2hrs pain is back, wants to know if can prescribe her something until she gets appt for MRI? Please advise!  C/B # [phone]  [facility] [address]  Pt is aware if Narcotic will have to come and pick up

## 2016-02-22 NOTE — Telephone Encounter (Signed)
Please advise 

## 2016-02-22 NOTE — Addendum Note (Signed)
Addended by: Daylene Posey T on: 02/22/2016 01:06 PM   Modules accepted: Orders

## 2016-02-23 NOTE — Telephone Encounter (Signed)
Increase tramadol she has to q 6 and rf if necessary thx

## 2016-02-25 ENCOUNTER — Telehealth (INDEPENDENT_AMBULATORY_CARE_PROVIDER_SITE_OTHER): Payer: Self-pay | Admitting: Radiology

## 2016-02-25 MED ORDER — TRAMADOL HCL 50 MG PO TABS: 50.0000 mg | ORAL_TABLET | Freq: Four times a day (QID) | ORAL | 0 refills | Status: DC | PRN

## 2016-02-25 NOTE — Telephone Encounter (Signed)
Noted, pt has appt with [facility] MRI on Mar 1, pt is already aware of appt

## 2016-02-25 NOTE — Telephone Encounter (Signed)
Patient called and states that the pain is unbearable in her upper back and shoulders. She cannot sleep. She does not know if you can prescribe something stronger as the tramadol is not working. She has not heard about a date for her MRI yet. Please advise if you can give her something stronger, if you want to see her in the office, or if you want her to go to the emergency room.

## 2016-02-25 NOTE — Telephone Encounter (Signed)
IC patient, see other message

## 2016-02-25 NOTE — Telephone Encounter (Signed)
IC patient and advised, refill called in to pharm for her.    Gabriel Cirri- can you check on status of authorizations?  Patient is having severe pain-  thanks

## 2016-03-03 ENCOUNTER — Encounter (HOSPITAL_COMMUNITY): Payer: Medicare HMO

## 2016-03-04 ENCOUNTER — Encounter (HOSPITAL_COMMUNITY): Payer: Medicare HMO

## 2016-03-05 ENCOUNTER — Encounter (HOSPITAL_COMMUNITY): Payer: Medicare HMO

## 2016-03-06 ENCOUNTER — Ambulatory Visit (HOSPITAL_COMMUNITY)
Admission: RE | Admit: 2016-03-06 | Discharge: 2016-03-06 | Disposition: A | Discharge status: Home / Self Care | Payer: Medicare HMO | Source: Ambulatory Visit | Attending: Orthopedic Surgery | Admitting: Orthopedic Surgery

## 2016-03-06 ENCOUNTER — Encounter (HOSPITAL_COMMUNITY)
Admission: RE | Admit: 2016-03-06 | Discharge: 2016-03-06 | Disposition: A | Discharge status: Home / Self Care | Payer: Medicare HMO | Source: Ambulatory Visit | Attending: Orthopedic Surgery | Admitting: Orthopedic Surgery

## 2016-03-06 DIAGNOSIS — M542 Cervicalgia: Secondary | ICD-10-CM

## 2016-03-06 DIAGNOSIS — S46012A Strain of muscle(s) and tendon(s) of the rotator cuff of left shoulder, initial encounter: Secondary | ICD-10-CM | POA: Diagnosis not present

## 2016-03-06 DIAGNOSIS — M25512 Pain in left shoulder: Principal | ICD-10-CM

## 2016-03-06 DIAGNOSIS — M50222 Other cervical disc displacement at C5-C6 level: Secondary | ICD-10-CM | POA: Insufficient documentation

## 2016-03-06 DIAGNOSIS — M75102 Unspecified rotator cuff tear or rupture of left shoulder, not specified as traumatic: Secondary | ICD-10-CM | POA: Diagnosis not present

## 2016-03-06 DIAGNOSIS — G8929 Other chronic pain: Secondary | ICD-10-CM

## 2016-03-06 DIAGNOSIS — M50322 Other cervical disc degeneration at C5-C6 level: Secondary | ICD-10-CM | POA: Diagnosis not present

## 2016-03-06 DIAGNOSIS — M47892 Other spondylosis, cervical region: Secondary | ICD-10-CM | POA: Diagnosis not present

## 2016-03-12 ENCOUNTER — Other Ambulatory Visit: Payer: Self-pay | Admitting: Infectious Diseases

## 2016-03-12 ENCOUNTER — Encounter (INDEPENDENT_AMBULATORY_CARE_PROVIDER_SITE_OTHER): Payer: Self-pay | Admitting: Orthopedic Surgery

## 2016-03-12 ENCOUNTER — Ambulatory Visit (INDEPENDENT_AMBULATORY_CARE_PROVIDER_SITE_OTHER): Payer: Medicare HMO | Admitting: Orthopedic Surgery

## 2016-03-12 DIAGNOSIS — M7502 Adhesive capsulitis of left shoulder: Secondary | ICD-10-CM

## 2016-03-12 DIAGNOSIS — E785 Hyperlipidemia, unspecified: Secondary | ICD-10-CM

## 2016-03-12 DIAGNOSIS — M7501 Adhesive capsulitis of right shoulder: Secondary | ICD-10-CM | POA: Diagnosis not present

## 2016-03-12 MED ORDER — BUPIVACAINE HCL 0.5 % IJ SOLN
9.0000 mL | INTRAMUSCULAR | Status: AC | PRN
  Administered 2016-03-12: 9 mL via INTRA_ARTICULAR

## 2016-03-12 MED ORDER — LIDOCAINE HCL 1 % IJ SOLN
5.0000 mL | INTRAMUSCULAR | Status: AC | PRN
  Administered 2016-03-12: 5 mL

## 2016-03-12 MED ORDER — METHYLPREDNISOLONE ACETATE 40 MG/ML IJ SUSP
40.0000 mg | INTRAMUSCULAR | Status: AC | PRN
  Administered 2016-03-12: 40 mg via INTRA_ARTICULAR

## 2016-03-12 NOTE — Progress Notes (Signed)
Office Visit Note   Patient: Laurie Gray           Date of Birth: 04-Jan-1956           MRN: [id number] Visit Date: 03/12/2016 Requested by: Laurie Chandler, NP [place], [place] [address] PCP: Laurie Chandler, NP  Subjective: Chief Complaint  Patient presents with  . Left Shoulder - Pain, Follow-up  . Neck - Pain, Follow-up    HPI: Procedure Laurie Gray is a 61-year-old female here for review of MRI scan of left shoulder and cervical spine.  She still is having pain is relatively constant and 6 out of 10.  She also describes decreased range of motion.  She states the year ago she was doing yoga and could join her hands behind her back.  Now she is a very long distance away from being able to do that.  She's had a year of symptoms.  She does not have diabetes.  She does have a history of thyroid cancer which was removed recently.  MRI scans are reviewed.  Cervical spine has a very mild C5-6 disc bulge but otherwise is unremarkable.  Specifically no metastatic disease.  MRI scan of the left shoulder also reviewed.  Patient does have some tendinosis and a little bit of higher glass pinching of the rotator cuff between the humeral head and the lateral aspect of the acromion.  More notable and not mentioned on the MRI report is the thickening of the axillary recess and absence of the teardrop-shaped axillary recess consistent with frozen shoulder.              ROS:   Constitutional: Patient appears well-developed HEENT:  Head: Normocephalic Eyes:EOM are normal Neck: Normal range of motion Cardiovascular: Normal rate Pulmonary/chest: Effort normal Neurologic: Patient is alert Skin: Skin is warm Psychiatric: Patient has normal mood and affect    Assessment & Plan: Visit Diagnoses: No diagnosis found.  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  Plan is to start formal physical therapy for  both shoulders for range of motion and I also injected the left glenohumeral joint today.  We'll see her back in 6 weeks for repeat assessment.  Follow-Up Instructions: Return in about 6 weeks (around 04/23/2016).   Orders:  No orders of the defined types were placed in this encounter.  No orders of the defined types were placed in this encounter.     Procedures: Large Joint Inj Date/Time: 03/12/2016 12:13 PM Performed by: Laurie Gray Authorized by: Laurie Gray   Consent Given by:  Patient Site marked: the procedure site was marked   Timeout: prior to procedure the correct patient, procedure, and site was verified   Indications:  Pain and diagnostic evaluation Location:  Shoulder Site:  L glenohumeral Prep: patient was prepped and draped in usual sterile fashion   Needle Size:  18 G Needle Length:  1.5 inches Approach:  Posterior Ultrasound Guidance: No   Fluoroscopic Guidance: No   Arthrogram: No   Medications:  5 mL lidocaine 1 %; 9 mL bupivacaine 0.5 %; 40 mg methylPREDNISolone acetate 40 MG/ML Aspiration Attempted: No   Patient tolerance:  Patient tolerated the procedure well with no immediate complications     Clinical Data: No additional findings.  Objective: Vital Signs: There were no vitals taken for this visit.  Physical Exam:   Constitutional: Patient appears well-developed HEENT:  Head: Normocephalic Eyes:EOM are normal Neck: Normal range of motion Cardiovascular: Normal rate Pulmonary/chest: Effort normal Neurologic: Patient is alert Skin: Skin is warm Psychiatric: Patient has normal mood and affect    Ortho Exam: Orthopedic exam demonstrates good cervical spine range of motion good rotator cuff strength bilaterally 5 out of 5 grip EPL FPL interosseous wrist flexion and wrist extension strength.  Radial pulse intact bilaterally.  Patient does have limited forward flexion on the left to about 100 and about 130 on the right.  External  rotation however at 15 of abduction and is pretty symmetric at about 60.  Isolated glenohumeral abduction is about 80 on the right and 75 on the left  Specialty Comments:  No specialty comments available.  Imaging: No results found.   PMFS History: Patient Active Problem List   Diagnosis Date Noted  . Chronic right shoulder pain 02/18/2016  . Chronic left shoulder pain 02/18/2016  . Neck pain 02/18/2016  . Thyroid cancer ([place]) 01/31/2016  . 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 Status post surgery 05/29/2015  . Hammertoe 04/02/2015  . S/P bunionectomy 11/08/2014  . Arthritis 07/07/2014  . Osteopenia 04/19/2014  . Routine general medical examination at a health care facility 03/28/2014  . Depression 11/02/2013  . Hyperlipidemia 11/02/2013  . Vaginal dysplasia 04/28/2013  . Menopause 04/06/2013  . Vaginal atrophy 04/06/2013  . Right hip pain 08/23/2012  . Pain, dental 04/07/2012  . Vaginal bleeding 02/17/2012  . 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 Headache 12/01/2011  . Pharyngitis 07/21/2011  . Cellulitis 07/03/2010  . 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 Disorder of bone and cartilage 09/27/2009  . HAND PAIN, RIGHT 09/24/2009  . Acute URI 01/08/2009  . ESSENTIAL HYPERTENSION, BENIGN 07/27/2008  . BREAST MASS 07/03/2008  . Human immunodeficiency virus (HIV) disease ([place]) 04/03/2008  . HSV 04/03/2008  . 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 Anemia   . Anxiety    hx of-not on any meds  . Arthritis    "shoulders, arms, fingers, neck, back" (01/31/2016)  . Chronic cervical pain   . History of bronchitis   . History of colon polyps   . History of kidney stones   . HIV infection ([place]) dx'd 2000   takes odefsey  . Hyperlipidemia    takes Pravastatin daily  . Hypertension    takes Benicar HCT daily  . Hyperthyroidism   . Hypothyroidism   . Internal hemorrhoids   . Joint pain   . Migraine    "in the past"  (01/31/2016)  . Pituitary adenoma ([place])   . Refusal of blood transfusions as patient is Jehovah's Witness    "but substitutes are fine". (01/31/2016)  . Thyroid cancer ([place]) 12/2015  . Upper back pain     Family History  Problem Relation Age of Onset  . Breast cancer Mother   . Hypertension Mother   . Cancer Mother   . Hyperlipidemia Mother   . Stroke Mother   . Colon cancer Neg Hx     Past Surgical History:  Procedure Laterality Date  . ABDOMINAL HYSTERECTOMY    . BUNIONECTOMY Right   . COLONOSCOPY    . CYSTOSCOPY    . HAMMER TOE SURGERY Left    "middle toe was broke; turned into hammertoe then repaired"  . HAMMER TOE SURGERY Bilateral    "both small toes"  . PARATHYROIDECTOMY Right 01/31/2016  . PARATHYROIDECTOMY Right 01/31/2016   Procedure: PARATHYROIDECTOMY ON  RIGHT SIDE;  Surgeon: Izora Gala, MD;  Location: [facility];  Service: ENT;  Laterality: Right;  . SALPINGOOPHORECTOMY Bilateral   . THYROIDECTOMY N/A 01/31/2016   Procedure: TOTAL THYROIDECTOMY;  Surgeon: Izora Gala, MD;  Location: [facility];  Service: ENT;  Laterality: N/A;  . TOE SURGERY     bunion  . TONSILLECTOMY     as a child  . TOTAL THYROIDECTOMY  01/31/2016  . TUBAL LIGATION     30 yrs ago   Social History   Occupational History  . Not on file.   Social History Main Topics  . Smoking status: Never Smoker  . Smokeless tobacco: Never Used  . Alcohol use 0.0 oz/week     Comment: 01/31/2016 "might go out to eat and have a drink once/month; no more than that"  . Drug use: No  . Sexual activity: Not Currently    Partners: Male     Comment: pt. given condoms. 1st intercourse- 16, partners- greater than 5

## 2016-03-14 ENCOUNTER — Encounter (HOSPITAL_COMMUNITY): Payer: Medicare HMO

## 2016-03-19 ENCOUNTER — Encounter (HOSPITAL_COMMUNITY)
Admission: RE | Admit: 2016-03-19 | Discharge: 2016-03-19 | Disposition: A | Discharge status: Home / Self Care | Payer: Medicare HMO | Source: Ambulatory Visit | Attending: Internal Medicine | Admitting: Internal Medicine

## 2016-03-19 DIAGNOSIS — C73 Malignant neoplasm of thyroid gland: Secondary | ICD-10-CM | POA: Diagnosis not present

## 2016-03-19 MED ORDER — STERILE WATER FOR INJECTION IJ SOLN
INTRAMUSCULAR | Status: AC
  Filled 2016-03-19: qty 10

## 2016-03-19 MED ORDER — THYROTROPIN ALFA 1.1 MG IM SOLR
0.9000 mg | INTRAMUSCULAR | Status: AC
  Administered 2016-03-19: 0.9 mg via INTRAMUSCULAR

## 2016-03-20 ENCOUNTER — Encounter (HOSPITAL_COMMUNITY)
Admission: RE | Admit: 2016-03-20 | Discharge: 2016-03-20 | Disposition: A | Discharge status: Home / Self Care | Payer: Medicare HMO | Source: Ambulatory Visit | Attending: Internal Medicine | Admitting: Internal Medicine

## 2016-03-20 DIAGNOSIS — C73 Malignant neoplasm of thyroid gland: Secondary | ICD-10-CM | POA: Diagnosis not present

## 2016-03-20 MED ORDER — STERILE WATER FOR INJECTION IJ SOLN
INTRAMUSCULAR | Status: AC
  Filled 2016-03-20: qty 10

## 2016-03-20 MED ORDER — THYROTROPIN ALFA 1.1 MG IM SOLR
0.9000 mg | INTRAMUSCULAR | Status: AC
  Administered 2016-03-20: 0.9 mg via INTRAMUSCULAR

## 2016-03-21 ENCOUNTER — Encounter (HOSPITAL_COMMUNITY)
Admission: RE | Admit: 2016-03-21 | Discharge: 2016-03-21 | Disposition: A | Discharge status: Home / Self Care | Payer: Medicare HMO | Source: Ambulatory Visit | Attending: Internal Medicine | Admitting: Internal Medicine

## 2016-03-21 DIAGNOSIS — C73 Malignant neoplasm of thyroid gland: Secondary | ICD-10-CM | POA: Diagnosis not present

## 2016-03-21 LAB — HCG, SERUM, QUALITATIVE: Preg, Serum: NEGATIVE

## 2016-03-21 MED ORDER — SODIUM IODIDE I 131 CAPSULE
108.2000 | Freq: Once | INTRAVENOUS | Status: AC | PRN
  Administered 2016-03-21: 108.6 via ORAL

## 2016-03-24 ENCOUNTER — Other Ambulatory Visit (INDEPENDENT_AMBULATORY_CARE_PROVIDER_SITE_OTHER): Payer: Self-pay | Admitting: Orthopedic Surgery

## 2016-03-24 NOTE — Telephone Encounter (Signed)
Rx request 

## 2016-03-26 NOTE — Telephone Encounter (Signed)
Ok for 1 po q 12 # 30 pls clal thx

## 2016-03-26 NOTE — Telephone Encounter (Signed)
a 

## 2016-03-26 NOTE — Telephone Encounter (Signed)
This is a request for refill, last refilled on #60 on 03/06/16, please advise.

## 2016-03-27 ENCOUNTER — Telehealth (INDEPENDENT_AMBULATORY_CARE_PROVIDER_SITE_OTHER): Payer: Self-pay | Admitting: Orthopedic Surgery

## 2016-03-27 DIAGNOSIS — M25512 Pain in left shoulder: Secondary | ICD-10-CM | POA: Diagnosis not present

## 2016-03-27 NOTE — Telephone Encounter (Signed)
This is addicted medicine I would only take it 3 times a day 4 times a day he'll get addicted okay for her to have enough medication for 3 times a day but I wouldn't do 4 times a day so okay to refill her medications that she has enough or 3 pills a day please call thanks

## 2016-03-27 NOTE — Telephone Encounter (Signed)
Called patient and she is upset because she was only given #30 ultram for the month instead of the #60 that she had been getting from her PCP. She states this is not going to last her the full month. She states specially since she is in severe pain and has a RCT. Please advise.

## 2016-03-27 NOTE — Telephone Encounter (Signed)
Patient called having a few questions about her recent medication refill on Tramadol. She says it will only last her 15 days. It was 4 times a day and it went down to 2 a day and she's still in a lot of pain. CB # [phone]

## 2016-03-28 MED ORDER — TRAMADOL HCL 50 MG PO TABS: ORAL_TABLET | ORAL | 0 refills | Status: DC

## 2016-03-28 NOTE — Telephone Encounter (Signed)
rx called to pharmacy. Patient advised done.  

## 2016-03-28 NOTE — Addendum Note (Signed)
Addended byLaurann Montana on: 03/28/2016 08:51 AM   Modules accepted: Orders

## 2016-03-31 DIAGNOSIS — M25512 Pain in left shoulder: Secondary | ICD-10-CM | POA: Diagnosis not present

## 2016-04-02 ENCOUNTER — Encounter (HOSPITAL_COMMUNITY)
Admission: RE | Admit: 2016-04-02 | Discharge: 2016-04-02 | Disposition: A | Discharge status: Home / Self Care | Payer: Medicare HMO | Source: Ambulatory Visit | Attending: Internal Medicine | Admitting: Internal Medicine

## 2016-04-02 DIAGNOSIS — C73 Malignant neoplasm of thyroid gland: Secondary | ICD-10-CM

## 2016-04-04 DIAGNOSIS — M25512 Pain in left shoulder: Secondary | ICD-10-CM | POA: Diagnosis not present

## 2016-04-07 ENCOUNTER — Other Ambulatory Visit: Payer: Self-pay | Admitting: *Deleted

## 2016-04-07 DIAGNOSIS — E209 Hypoparathyroidism, unspecified: Secondary | ICD-10-CM | POA: Diagnosis not present

## 2016-04-07 DIAGNOSIS — E039 Hypothyroidism, unspecified: Secondary | ICD-10-CM | POA: Diagnosis not present

## 2016-04-07 DIAGNOSIS — E221 Hyperprolactinemia: Secondary | ICD-10-CM | POA: Diagnosis not present

## 2016-04-07 DIAGNOSIS — E559 Vitamin D deficiency, unspecified: Secondary | ICD-10-CM | POA: Diagnosis not present

## 2016-04-07 DIAGNOSIS — R7989 Other specified abnormal findings of blood chemistry: Secondary | ICD-10-CM | POA: Diagnosis not present

## 2016-04-07 MED ORDER — TRAMADOL HCL 50 MG PO TABS: ORAL_TABLET | ORAL | 0 refills | Status: DC

## 2016-04-07 NOTE — Telephone Encounter (Signed)
Patient Requested to be faxed to pharmacy. 

## 2016-04-23 ENCOUNTER — Ambulatory Visit (INDEPENDENT_AMBULATORY_CARE_PROVIDER_SITE_OTHER): Payer: Medicare HMO

## 2016-04-23 ENCOUNTER — Encounter (INDEPENDENT_AMBULATORY_CARE_PROVIDER_SITE_OTHER): Payer: Self-pay | Admitting: Orthopedic Surgery

## 2016-04-23 ENCOUNTER — Ambulatory Visit (INDEPENDENT_AMBULATORY_CARE_PROVIDER_SITE_OTHER): Payer: Medicare HMO | Admitting: Orthopedic Surgery

## 2016-04-23 DIAGNOSIS — M25512 Pain in left shoulder: Secondary | ICD-10-CM | POA: Diagnosis not present

## 2016-04-23 DIAGNOSIS — M25531 Pain in right wrist: Secondary | ICD-10-CM | POA: Diagnosis not present

## 2016-04-23 MED ORDER — TRAMADOL HCL 50 MG PO TABS: 50.0000 mg | ORAL_TABLET | Freq: Three times a day (TID) | ORAL | 0 refills | Status: DC | PRN

## 2016-04-23 NOTE — Progress Notes (Signed)
Office Visit Note   Patient: Laurie Gray           Date of Birth: 22-Apr-1955           MRN: [id number] Visit Date: 04/23/2016 Requested by: Lauree Chandler, NP [place], [place] [address] PCP: Lauree Chandler, NP  Subjective: Chief Complaint  Patient presents with  . Left Shoulder - Follow-up    HPI: Patient is a 61 year old female with left shoulder pain.  She has left frozen shoulder and underwent injection 03/12/2016.  Did have some relief with that.  Reports still having some pain and decreased range of motion.  The pain is worse at night.  She states it however she is a little bit better.  She has to take Ultram to rest at night.  She's been going to physical therapy and doing exercises at home using alternating ice and heat and compression.  She does describe having some fatigue from thyroid hormone treatment.  She states that her range of motion in the left shoulder is improving but it is hard for her to sleep.  The pain has subsided some since the injection.  Dry needling is been tried along with physical therapy.  Patient also describes right wrist pain.  She feels a hard not on the volar aspect of the wrist.  She is right-hand dominant.  Has not had any trauma or injury to the right hand.  Denies any neck pain or radicular symptoms.              ROS: All systems reviewed are negative as they relate to the chief complaint within the history of present illness.  Patient denies  fevers or chills.   Assessment & Plan: Visit Diagnoses:  1. Left shoulder pain, unspecified chronicity     Plan: Impression is improvement in left frozen shoulder in terms of range of motion and decreased pain.  I think she needs to continue on the current treatment regimen for now.  She has had some improvement so I would not consider another injection yet but should she go backwards in terms of increasing pain or increasing stiffness than another injection would be indicated.  I  did tell her that it's important to do exercises on a daily basis.  In regard to the right wrist.  The patient does have ulnar negative variance.  No evidence of keinbocks disease.  This mass she feels on her hand is just a very prominent [place] form.  She has reasonable wrist range of motion.  Radiographs unremarkable other than the ulnar negative variance.  I will refill her tramadol and we can watch the hand for now.  Could consider further imaging if she develops worsening symptoms ordered diminishing range of motion in the wrist.    Follow-Up Instructions: No Follow-up on file.   Orders:  Orders Placed This Encounter  Procedures  . XR Hand Complete Right   Meds ordered this encounter  Medications  . traMADol (ULTRAM) 50 MG tablet    Sig: Take 1 tablet (50 mg total) by mouth every 8 (eight) hours as needed.    Dispense:  90 tablet    Refill:  0      Procedures: No procedures performed   Clinical Data: No additional findings.  Objective: Vital Signs: There were no vitals taken for this visit.  Physical Exam:   Constitutional: Patient appears well-developed HEENT:  Head: Normocephalic Eyes:EOM are normal Neck: Normal range of motion Cardiovascular: Normal  rate Pulmonary/chest: Effort normal Neurologic: Patient is alert Skin: Skin is warm Psychiatric: Patient has normal mood and affect    Ortho Exam: Orthopedic exam demonstrates improvement in left shoulder range of motion but still with side to side difference left versus right.  She has good rotator cuff strength.  She is able to get her hand behind her back at this time.  For flexion and abduction and approaching 90 with abduction and and getting closer to 130 with forward flexion.  Right hand is examined.  She has full pronation supination.  Radial pulses intact.  Grip strength is symmetric.  Does have some tenderness over the [place] form but no masses present in this area.  No cyst present.  No real swelling in the wrist  joint.  No tenderness over the first dorsal compartment.  Specialty Comments:  No specialty comments available.  Imaging: Xr Hand Complete Right  Result Date: 04/23/2016 Right hand 2 views AP and lateral reviewed.  Patient has on her negative variance.  Degenerative changes present within the intermetacarpal articulations.  Mild degenerative joint disease noted in the phalangeal joints also    PMFS History: Patient Active Problem List   Diagnosis Date Noted  . Chronic right shoulder pain 02/18/2016  . Chronic left shoulder pain 02/18/2016  . Neck pain 02/18/2016  . Thyroid cancer ([place]) 01/31/2016  . 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 Status post surgery 05/29/2015  . Hammertoe 04/02/2015  . S/P bunionectomy 11/08/2014  . Arthritis 07/07/2014  . Osteopenia 04/19/2014  . Routine general medical examination at a health care facility 03/28/2014  . Depression 11/02/2013  . Hyperlipidemia 11/02/2013  . Vaginal dysplasia 04/28/2013  . Menopause 04/06/2013  . Vaginal atrophy 04/06/2013  . Right hip pain 08/23/2012  . Pain, dental 04/07/2012  . Vaginal bleeding 02/17/2012  . 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 Headache 12/01/2011  . Pharyngitis 07/21/2011  . Cellulitis 07/03/2010  . 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 Disorder of bone and cartilage 09/27/2009  . HAND PAIN, RIGHT 09/24/2009  . Acute URI 01/08/2009  . ESSENTIAL HYPERTENSION, BENIGN 07/27/2008  . BREAST MASS 07/03/2008  . Human immunodeficiency virus (HIV) disease ([place]) 04/03/2008  . HSV 04/03/2008  . 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 Anemia   . Anxiety    hx of-not on any meds  . Arthritis    "shoulders, arms, fingers, neck, back" (01/31/2016)  . Chronic cervical pain   . History of bronchitis   . History of colon polyps   . History of kidney stones   . HIV infection ([place]) dx'd 2000   takes odefsey  . Hyperlipidemia    takes Pravastatin daily    . Hypertension    takes Benicar HCT daily  . Hyperthyroidism   . Hypothyroidism   . Internal hemorrhoids   . Joint pain   . Migraine    "in the past" (01/31/2016)  . Pituitary adenoma ([place])   . Refusal of blood transfusions as patient is Jehovah's Witness    "but substitutes are fine". (01/31/2016)  . Thyroid cancer ([place]) 12/2015  . Upper back pain     Family History  Problem Relation Age of Onset  . Breast cancer Mother   . Hypertension Mother   . Cancer Mother   . Hyperlipidemia Mother   . Stroke Mother   . Colon cancer Neg Hx     Past Surgical History:  Procedure Laterality Date  . ABDOMINAL  HYSTERECTOMY    . BUNIONECTOMY Right   . COLONOSCOPY    . CYSTOSCOPY    . HAMMER TOE SURGERY Left    "middle toe was broke; turned into hammertoe then repaired"  . HAMMER TOE SURGERY Bilateral    "both small toes"  . PARATHYROIDECTOMY Right 01/31/2016  . PARATHYROIDECTOMY Right 01/31/2016   Procedure: PARATHYROIDECTOMY ON RIGHT SIDE;  Surgeon: Izora Gala, MD;  Location: [facility];  Service: ENT;  Laterality: Right;  . SALPINGOOPHORECTOMY Bilateral   . THYROIDECTOMY N/A 01/31/2016   Procedure: TOTAL THYROIDECTOMY;  Surgeon: Izora Gala, MD;  Location: [facility];  Service: ENT;  Laterality: N/A;  . TOE SURGERY     bunion  . TONSILLECTOMY     as a child  . TOTAL THYROIDECTOMY  01/31/2016  . TUBAL LIGATION     30 yrs ago   Social History   Occupational History  . Not on file.   Social History Main Topics  . Smoking status: Never Smoker  . Smokeless tobacco: Never Used  . Alcohol use 0.0 oz/week     Comment: 01/31/2016 "might go out to eat and have a drink once/month; no more than that"  . Drug use: No  . Sexual activity: Not Currently    Partners: Male     Comment: pt. given condoms. 1st intercourse- 16, partners- greater than 5

## 2016-05-19 ENCOUNTER — Other Ambulatory Visit: Payer: Self-pay | Admitting: Nurse Practitioner

## 2016-05-20 ENCOUNTER — Encounter: Payer: Self-pay | Admitting: Nurse Practitioner

## 2016-05-20 ENCOUNTER — Other Ambulatory Visit: Payer: Self-pay | Admitting: Internal Medicine

## 2016-05-20 ENCOUNTER — Ambulatory Visit (INDEPENDENT_AMBULATORY_CARE_PROVIDER_SITE_OTHER): Payer: Medicare HMO | Admitting: Nurse Practitioner

## 2016-05-20 VITALS — BP 128/82 | HR 73 | Temp 98.7°F | Resp 18 | Ht 65.0 in | Wt 132.4 lb

## 2016-05-20 DIAGNOSIS — Z9189 Other specified personal risk factors, not elsewhere classified: Secondary | ICD-10-CM | POA: Diagnosis not present

## 2016-05-20 DIAGNOSIS — M7502 Adhesive capsulitis of left shoulder: Secondary | ICD-10-CM

## 2016-05-20 DIAGNOSIS — F419 Anxiety disorder, unspecified: Secondary | ICD-10-CM

## 2016-05-20 DIAGNOSIS — H348132 Central retinal vein occlusion, bilateral, stable: Secondary | ICD-10-CM

## 2016-05-20 DIAGNOSIS — E890 Postprocedural hypothyroidism: Secondary | ICD-10-CM | POA: Diagnosis not present

## 2016-05-20 DIAGNOSIS — I10 Essential (primary) hypertension: Secondary | ICD-10-CM | POA: Diagnosis not present

## 2016-05-20 DIAGNOSIS — E213 Hyperparathyroidism, unspecified: Secondary | ICD-10-CM

## 2016-05-20 DIAGNOSIS — K649 Unspecified hemorrhoids: Secondary | ICD-10-CM

## 2016-05-20 DIAGNOSIS — F32A Depression, unspecified: Secondary | ICD-10-CM

## 2016-05-20 DIAGNOSIS — M7501 Adhesive capsulitis of right shoulder: Secondary | ICD-10-CM

## 2016-05-20 DIAGNOSIS — F329 Major depressive disorder, single episode, unspecified: Secondary | ICD-10-CM | POA: Diagnosis not present

## 2016-05-20 DIAGNOSIS — B20 Human immunodeficiency virus [HIV] disease: Secondary | ICD-10-CM

## 2016-05-20 LAB — COMPLETE METABOLIC PANEL WITH GFR
ALBUMIN: 4.4 g/dL (ref 3.6–5.1)
ALK PHOS: 58 U/L (ref 33–130)
ALT: 15 U/L (ref 6–29)
AST: 17 U/L (ref 10–35)
BILIRUBIN TOTAL: 0.4 mg/dL (ref 0.2–1.2)
BUN: 19 mg/dL (ref 7–25)
CALCIUM: 10 mg/dL (ref 8.6–10.4)
CHLORIDE: 101 mmol/L (ref 98–110)
CO2: 28 mmol/L (ref 20–31)
CREATININE: 0.86 mg/dL (ref 0.50–0.99)
GFR, Est African American: 85 mL/min (ref >=60)
GFR, Est Non African American: 74 mL/min (ref >=60)
Glucose, Bld: 70 mg/dL (ref 65–99)
Potassium: 3.5 mmol/L (ref 3.5–5.3)
Sodium: 139 mmol/L (ref 135–146)
TOTAL PROTEIN: 7.3 g/dL (ref 6.1–8.1)

## 2016-05-20 MED ORDER — HYDROCORTISONE 1 % RE CREA: TOPICAL_CREAM | RECTAL | 1 refills | Status: DC

## 2016-05-20 NOTE — Patient Instructions (Signed)
Okay to use naproxen 1 tablet twice daily as needed for severe pain  Heart-Healthy Eating Plan Many factors influence your heart health, including eating and exercise habits. Heart (coronary) risk increases with abnormal blood fat (lipid) levels. Heart-healthy meal planning includes limiting unhealthy fats, increasing healthy fats, and making other small dietary changes. This includes maintaining a healthy body weight to help keep lipid levels within a normal range. What is my plan? Your health care provider recommends that you:  Get no more than _________% of the total calories in your daily diet from fat.  Limit your intake of saturated fat to less than _________% of your total calories each day.  Limit the amount of cholesterol in your diet to less than _________ mg per day. What types of fat should I choose?  Choose healthy fats more often. Choose monounsaturated and polyunsaturated fats, such as olive oil and canola oil, flaxseeds, walnuts, almonds, and seeds.  Eat more omega-3 fats. Good choices include salmon, mackerel, sardines, tuna, flaxseed oil, and ground flaxseeds. Aim to eat fish at least two times each week.  Limit saturated fats. Saturated fats are primarily found in animal products, such as meats, butter, and cream. Plant sources of saturated fats include palm oil, palm kernel oil, and coconut oil.  Avoid foods with partially hydrogenated oils in them. These contain trans fats. Examples of foods that contain trans fats are stick margarine, some tub margarines, cookies, crackers, and other baked goods. What general guidelines do I need to follow?  Check food labels carefully to identify foods with trans fats or high amounts of saturated fat.  Fill one half of your plate with vegetables and green salads. Eat 4-5 servings of vegetables per day. A serving of vegetables equals 1 cup of raw leafy vegetables,  cup of raw or cooked cut-up vegetables, or  cup of vegetable  juice.  Fill one fourth of your plate with whole grains. Look for the word "whole" as the first word in the ingredient list.  Fill one fourth of your plate with lean protein foods.  Eat 4-5 servings of fruit per day. A serving of fruit equals one medium whole fruit,  cup of dried fruit,  cup of fresh, frozen, or canned fruit, or  cup of 100% fruit juice.  Eat more foods that contain soluble fiber. Examples of foods that contain this type of fiber are apples, broccoli, carrots, beans, peas, and barley. Aim to get 20-30 g of fiber per day.  Eat more home-cooked food and less restaurant, buffet, and fast food.  Limit or avoid alcohol.  Limit foods that are high in starch and sugar.  Avoid fried foods.  Cook foods by using methods other than frying. Baking, boiling, grilling, and broiling are all great options. Other fat-reducing suggestions include:  Removing the skin from poultry.  Removing all visible fats from meats.  Skimming the fat off of stews, soups, and gravies before serving them.  Steaming vegetables in water or broth.  Lose weight if you are overweight. Losing just 5-10% of your initial body weight can help your overall health and prevent diseases such as diabetes and heart disease.  Increase your consumption of nuts, legumes, and seeds to 4-5 servings per week. One serving of dried beans or legumes equals  cup after being cooked, one serving of nuts equals 1 ounces, and one serving of seeds equals  ounce or 1 tablespoon.  You may need to monitor your salt (sodium) intake, especially if you have  high blood pressure. Talk with your health care provider or dietitian to get more information about reducing sodium. What foods can I eat? Grains   Breads, including [place], white, pita, wheat, raisin, rye, oatmeal, and [place]. Tortillas that are neither fried nor made with lard or trans fat. Low-fat rolls, including hotdog and hamburger buns and English muffins. Biscuits.  Muffins. Waffles. Pancakes. Light popcorn. Whole-grain cereals. Flatbread. Melba toast. Pretzels. Breadsticks. Rusks. Low-fat snacks and crackers, including oyster, saltine, matzo, graham, animal, and rye. Rice and pasta, including brown rice and those that are made with whole wheat. Vegetables  All vegetables. Fruits  All fruits, but limit coconut. Meats and Other Protein Sources  Lean, well-trimmed beef, veal, pork, and lamb. Chicken and [place] without skin. All fish and shellfish. Wild duck, rabbit, pheasant, and venison. Egg whites or low-cholesterol egg substitutes. Dried beans, peas, lentils, and tofu.Seeds and most nuts. Dairy  Low-fat or nonfat cheeses, including ricotta, string, and mozzarella. Skim or 1% milk that is liquid, powdered, or evaporated. Buttermilk that is made with low-fat milk. Nonfat or low-fat yogurt. Beverages  Mineral water. Diet carbonated beverages. Sweets and Desserts  Sherbets and fruit ices. Honey, jam, marmalade, jelly, and syrups. Meringues and gelatins. Pure sugar candy, such as hard candy, jelly beans, gumdrops, mints, marshmallows, and small amounts of dark chocolate. [employer] Eat all sweets and desserts in moderation. Fats and Oils  Nonhydrogenated (trans-free) margarines. Vegetable oils, including soybean, sesame, sunflower, olive, peanut, safflower, corn, canola, and cottonseed. Salad dressings or mayonnaise that are made with a vegetable oil. Limit added fats and oils that you use for cooking, baking, salads, and as spreads. Other  Cocoa powder. Coffee and tea. All seasonings and condiments. The items listed above may not be a complete list of recommended foods or beverages. Contact your dietitian for more options.  What foods are not recommended? Grains  Breads that are made with saturated or trans fats, oils, or whole milk. Croissants. Butter rolls. Cheese breads. Sweet rolls. Donuts. Buttered popcorn. Chow mein noodles. High-fat crackers,  such as cheese or butter crackers. Meats and Other Protein Sources  Fatty meats, such as hotdogs, short ribs, sausage, spareribs, bacon, ribeye roast or steak, and mutton. High-fat deli meats, such as salami and bologna. Caviar. Domestic duck and goose. Organ meats, such as kidney, liver, sweetbreads, brains, gizzard, chitterlings, and heart. Dairy  Cream, sour cream, cream cheese, and creamed cottage cheese. Whole milk cheeses, including blue (bleu), Monte[place] Jack, [place], [place], American, [place], Swiss, [place], [place], and [place]. Whole or 2% milk that is liquid, evaporated, or condensed. Whole buttermilk. Cream sauce or high-fat cheese sauce. Yogurt that is made from whole milk. Beverages  Regular sodas and drinks with added sugar. Sweets and Desserts  Frosting. Pudding. Cookies. Cakes other than angel food cake. Candy that has milk chocolate or white chocolate, hydrogenated fat, butter, coconut, or unknown ingredients. Buttered syrups. Full-fat ice cream or ice cream drinks. Fats and Oils  Gravy that has suet, meat fat, or shortening. Cocoa butter, hydrogenated oils, palm oil, coconut oil, palm kernel oil. These can often be found in baked products, candy, fried foods, nondairy creamers, and whipped toppings. Solid fats and shortenings, including bacon fat, salt pork, lard, and butter. Nondairy cream substitutes, such as coffee creamers and sour cream substitutes. Salad dressings that are made of unknown oils, cheese, or sour cream. The items listed above may not be a complete list of foods and beverages to avoid. Contact your dietitian for more  information.  This information is not intended to replace advice given to you by your health care provider. Make sure you discuss any questions you have with your health care provider. Document Released: 10/02/2007 Document Revised: 07/13/2015 Document Reviewed: 06/16/2013 Elsevier Interactive Patient Education  2017 [employer].

## 2016-05-20 NOTE — Progress Notes (Signed)
Careteam: Patient Care Team: Lauree Chandler, NP as PCP - General (Geriatric Medicine) Campbell Riches, MD as PCP - Infectious Diseases (Infectious Diseases) Jackolyn Confer, MD as Consulting Physician (General Surgery)  Advanced Directive information Does Patient Have a Medical Advance Directive?: No, Would patient like information on creating a medical advance directive?: Yes (MAU/Ambulatory/Procedural Areas - Information given)  Allergies  Allergen Reactions  . Sulfonamide Derivatives Swelling    SWELLING REACTION UNSPECIFIED     Chief Complaint  Patient presents with  . Medical Management of Chronic Issues    Pt is being seen for a 6 month routine follow up of chronic conditions.      HPI: Patient is a 61 y.o. female seen in the office today for routine follow up.   Shoulder pain- went to ortho, has adhesive capsulitis of both shoulders currently getting PT which has been very beneficial. Left is worse than right, sometimes having severe pain and will take naproxen 220 mg to help   Has use preparation H due to hemorrhoids- trying to take more fiber to help with stools. Has stopped doing spin class   Thyroid carcinoma and hyperparathyroid- Had parathyroid and thyroid removed. Has gained weight. Following with endocrinologist who is prescribing her synthroid. Cutting calorie intake because she has gained weight. Has also noted teeth breaking and dry mouth since the radiation and dry eyes, using eye drops but not very helpful.   Blood pressure has been all over the place- she reports sometimes blood pressure is high and other times it is low and she feels bad.  Blood pressure ranging from 117-130/60-80  Review of Systems:  Review of Systems  Constitutional: Positive for fatigue. Negative for activity change, appetite change and unexpected weight change.  HENT: Positive for voice change ( dysphonia, has improved after thyroid removed). Negative for congestion and  hearing loss.   Eyes: Positive for visual disturbance (progressive decline, hx of central retinal vein occlusion).  Respiratory: Negative for cough and shortness of breath.   Cardiovascular: Negative for chest pain, palpitations and leg swelling.  Gastrointestinal: Positive for rectal pain (due to hemorrhoids). Negative for abdominal pain, constipation and diarrhea.  Genitourinary: Negative for difficulty urinating and dysuria.  Musculoskeletal: Positive for arthralgias. Negative for gait problem and myalgias.       Bone and muscle aches, bilateral shoulders  Skin: Negative for color change and wound.       Hair thinning  Neurological: Negative for dizziness and weakness.  Psychiatric/Behavioral: Negative for agitation, behavioral problems and confusion. The patient is not nervous/anxious.     Past Medical History:  Diagnosis Date  . Anemia   . Anxiety    hx of-not on any meds  . Arthritis    "shoulders, arms, fingers, neck, back" (01/31/2016)  . Chronic cervical pain   . History of bronchitis   . History of colon polyps   . History of kidney stones   . HIV infection ([place]) dx'd 2000   takes odefsey  . Hyperlipidemia    takes Pravastatin daily  . Hypertension    takes Benicar HCT daily  . Hyperthyroidism   . Hypothyroidism   . Internal hemorrhoids   . Joint pain   . Migraine    "in the past" (01/31/2016)  . Pituitary adenoma ([place])   . Refusal of blood transfusions as patient is Jehovah's Witness    "but substitutes are fine". (01/31/2016)  . Thyroid cancer ([place]) 12/2015  . Upper back pain  Past Surgical History:  Procedure Laterality Date  . ABDOMINAL HYSTERECTOMY    . BUNIONECTOMY Right   . COLONOSCOPY    . CYSTOSCOPY    . HAMMER TOE SURGERY Left    "middle toe was broke; turned into hammertoe then repaired"  . HAMMER TOE SURGERY Bilateral    "both small toes"  . PARATHYROIDECTOMY Right 01/31/2016  . PARATHYROIDECTOMY Right 01/31/2016   Procedure:  PARATHYROIDECTOMY ON RIGHT SIDE;  Surgeon: Izora Gala, MD;  Location: [facility];  Service: ENT;  Laterality: Right;  . SALPINGOOPHORECTOMY Bilateral   . THYROIDECTOMY N/A 01/31/2016   Procedure: TOTAL THYROIDECTOMY;  Surgeon: Izora Gala, MD;  Location: [facility];  Service: ENT;  Laterality: N/A;  . TOE SURGERY     bunion  . TONSILLECTOMY     as a child  . TOTAL THYROIDECTOMY  01/31/2016  . TUBAL LIGATION     30 yrs ago   Social History:   reports that she has never smoked. She has never used smokeless tobacco. She reports that she drinks alcohol. She reports that she does not use drugs.  Family History  Problem Relation Age of Onset  . Breast cancer Mother   . Hypertension Mother   . Cancer Mother   . Hyperlipidemia Mother   . Stroke Mother   . Colon cancer Neg Hx     Medications: Patient's Medications  New Prescriptions   No medications on file  Previous Medications   FIBER ADULT GUMMIES PO    Take 2 tablets by mouth daily.   GLUCOSAMINE HCL (GLUCOSAMINE PO)    Take 1 tablet by mouth daily.   LEVOTHYROXINE (SYNTHROID, LEVOTHROID) 88 MCG TABLET    Take 88 mcg by mouth daily before breakfast.   MENTHOL-CAMPHOR (ICY HOT ADVANCED RELIEF) 16-11 % CREA    Apply 1 application topically 4 (four) times daily as needed (for shoulder pain).   MULTIPLE VITAMINS-MINERALS (MULTIVITAMIN ADULTS 50+ PO)    Take 1 tablet by mouth daily.   NAPROXEN SODIUM (ANAPROX) 220 MG TABLET    Take 220 mg by mouth at bedtime.   ODEFSEY 200-25-25 MG TABS TABLET    TAKE 1 TABLET BY MOUTH DAILY WITH BREAKFAST.   OLMESARTAN-HYDROCHLOROTHIAZIDE (BENICAR HCT) 20-12.5 MG TABLET    TAKE 1 TABLET BY MOUTH DAILY.   OMEGA-3 ACID ETHYL ESTERS (LOVAZA) 1 G CAPSULE    TAKE 1 CAPSULE EVERY DAY   PRAVASTATIN (PRAVACHOL) 40 MG TABLET    Take one tablet by mouth once daily for cholesterol   PROMETHAZINE (PHENERGAN) 12.5 MG TABLET    Take 12.5 mg by mouth every 6 (six) hours as needed for nausea or vomiting. Reported on 05/09/2015    TRAMADOL (ULTRAM) 50 MG TABLET    Take one tablet by mouth three times daily as needed for pain  Modified Medications   No medications on file  Discontinued Medications   HYDROCODONE-ACETAMINOPHEN (NORCO) 7.5-325 MG TABLET    Take 1 tablet by mouth every 6 (six) hours as needed for moderate pain.   LEVOTHYROXINE (SYNTHROID) 100 MCG TABLET    Take 1 tablet (100 mcg total) by mouth daily.   TRAMADOL (ULTRAM) 50 MG TABLET    Take 1 tablet (50 mg total) by mouth every 8 (eight) hours as needed.   TRAMADOL (ULTRAM) 50 MG TABLET    TAKE 1 TABLET BY MOUTH 3 TIMES DAILY AS NEEDED FOR PAIN     Physical Exam:  Vitals:   05/20/16 1317  BP: 128/82  Pulse:  73  Resp: 18  Temp: 98.7 F (37.1 C)  TempSrc: Oral  SpO2: 97%  Weight: 132 lb 6.4 oz (60.1 kg)  Height: '5\' 5"'  (1.651 m)   Body mass index is 22.03 kg/m.  Physical Exam  Constitutional: She is oriented to person, place, and time. She appears well-developed and well-nourished. No distress.  HENT:  Head: Normocephalic and atraumatic.  Mouth/Throat: Oropharynx is clear and moist. No oropharyngeal exudate.  Eyes: Conjunctivae are normal. Pupils are equal, round, and reactive to light.  Neck: Normal range of motion. Neck supple.  Cardiovascular: Normal rate, regular rhythm and normal heart sounds.   Pulmonary/Chest: Effort normal and breath sounds normal.  Abdominal: Soft. Bowel sounds are normal.  Musculoskeletal: She exhibits tenderness. She exhibits no edema.       Right shoulder: She exhibits decreased range of motion, tenderness, bony tenderness and decreased strength. She exhibits no swelling, no effusion and no crepitus.       Left shoulder: She exhibits decreased range of motion, tenderness, bony tenderness, pain and decreased strength.  decrease strength and ROM bilaterally.   Neurological: She is alert and oriented to person, place, and time.  Skin: Skin is warm and dry. She is not diaphoretic.  Psychiatric: Her mood appears  anxious.   Labs reviewed: Basic Metabolic Panel:  Recent Labs  11/19/15 1516 12/04/15 1623 01/24/16 1325 01/31/16 1852 02/01/16 0350 02/01/16 0920  NA 143 141 139  --   --   --   K 4.5 3.9 3.8  --   --   --   CL 108 105 106  --   --   --   CO2 '31 29 25  ' --   --   --   GLUCOSE 80 118* 96  --   --   --   BUN '17 13 9  ' --   --   --   CREATININE 0.94 1.07* 0.88  --   --   --   CALCIUM 11.7* 11.4* 11.9* 10.1 9.6 9.8   Liver Function Tests:  Recent Labs  11/19/15 1516 12/04/15 1623 01/24/16 1325  AST '18 17 20  ' ALT '14 12 15  ' ALKPHOS 52 49 48  BILITOT 0.3 0.4 0.3  PROT 6.5 6.9 6.6  ALBUMIN 4.2 4.2 4.1   No results for input(s): LIPASE, AMYLASE in the last 8760 hours. No results for input(s): AMMONIA in the last 8760 hours. CBC:  Recent Labs  11/19/15 1516 12/04/15 1623 01/24/16 1325  WBC 3.7* 3.7* 4.7  NEUTROABS 1,591  --   --   HGB 11.9 12.3 12.0  HCT 36.3 37.9 36.5  MCV 92.4 93.6 93.1  PLT 193 202 187   Lipid Panel:  Recent Labs  12/04/15 1623  CHOL 214*  HDL 72  LDLCALC 118*  TRIG 119  CHOLHDL 3.0   TSH: No results for input(s): TSH in the last 8760 hours. A1C: Lab Results  Component Value Date   HGBA1C 5.6 11/19/2015     Assessment/Plan 1. Essential hypertension -will cont current regimen at this time, home blood pressures are stable. Encouraged to dietary modifications such as dash diet.   - CMP with eGFR  2. Need for dental care - Ambulatory referral to [facility]  3. Adhesive capsulitis of both shoulders Left worse than right, improving with PT  4. Postoperative hypothyroidism Following with endocrine for adjustments of synthroid; currently taking 88 mcg daily and following with endocrine  5. Hyperparathyroidism ([place]) s/p parathyroidectomy  6. 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 - Ambulatory referral to Ophthalmology  7. Hemorrhoids, unspecified hemorrhoid type -encouraged high fiber diet, not to strain or stay in bathroom for extended period of time. - hydrocortisone (PROCTO-PAK) 1 % CREA; Twice daily as needed for hemorrhoids  Dispense: 28.35 g; Refill: 1  8. Anxiety and depression -reports anxiety and depression over total thyroidectomy and parathyroidectomy due to side effects from loss of thyroid and radiation    Camrin Lapre K. Harle Battiest  [facility] [phone] 8 am - 5 pm) [phone] (after hours)

## 2016-05-21 ENCOUNTER — Encounter: Payer: Self-pay | Admitting: Gynecology

## 2016-05-21 DIAGNOSIS — E039 Hypothyroidism, unspecified: Secondary | ICD-10-CM | POA: Diagnosis not present

## 2016-05-21 DIAGNOSIS — E559 Vitamin D deficiency, unspecified: Secondary | ICD-10-CM | POA: Diagnosis not present

## 2016-06-03 ENCOUNTER — Encounter: Payer: Self-pay | Admitting: Infectious Diseases

## 2016-06-03 ENCOUNTER — Ambulatory Visit (INDEPENDENT_AMBULATORY_CARE_PROVIDER_SITE_OTHER): Payer: Medicare HMO | Admitting: Licensed Clinical Social Worker

## 2016-06-03 ENCOUNTER — Ambulatory Visit (INDEPENDENT_AMBULATORY_CARE_PROVIDER_SITE_OTHER): Payer: Medicare HMO | Admitting: Infectious Diseases

## 2016-06-03 VITALS — BP 135/81 | HR 65 | Temp 98.7°F | Wt 134.0 lb

## 2016-06-03 DIAGNOSIS — Z79899 Other long term (current) drug therapy: Secondary | ICD-10-CM

## 2016-06-03 DIAGNOSIS — B20 Human immunodeficiency virus [HIV] disease: Secondary | ICD-10-CM | POA: Diagnosis not present

## 2016-06-03 DIAGNOSIS — Z113 Encounter for screening for infections with a predominantly sexual mode of transmission: Secondary | ICD-10-CM

## 2016-06-03 DIAGNOSIS — C73 Malignant neoplasm of thyroid gland: Secondary | ICD-10-CM | POA: Diagnosis not present

## 2016-06-03 DIAGNOSIS — F4321 Adjustment disorder with depressed mood: Secondary | ICD-10-CM

## 2016-06-03 NOTE — Assessment & Plan Note (Signed)
Appreciate ENT and Endo f/u.

## 2016-06-03 NOTE — Addendum Note (Signed)
Addended byMeriel Pica F on: 06/03/2016 03:32 PM   Modules accepted: Orders

## 2016-06-03 NOTE — Progress Notes (Signed)
   Subjective:    Patient ID: Laurie Gray, female    DOB: [birth year], 61 y.o.   MRN: [id number]  HPI 61 yo F with hx of dx 2000 HIV+, depression (she states this was temporary), hyperlipidemia, prev lap hysterecomy-BSO (2009). Previously took D4T cocktail, went to undetectable. Took herself off for 5 years. CD4 went under 200 and was restarted on atripla. Marland Kitchen  Has had f/u with GYN 04-2014 (nl). Has had previous abn on colpo (SIL).  Had normal mammo 03-2014 Had colon 2013.  At her previous visit was changed to [facility] after she had been started on fosamax for low bone mineral density.  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 Has been having muscle weakness, joint pain. PTH 97 (nl < 64).  She underwent total thyroidectomy 01-31-16 (Papillary) and 2 R parathyroids.  She also got radiocative Iodine.  She has since had dry mouth and dry eyes.  Couldn't taste food for 3 months, has had fatigue.      She also has a L frozen shoulder, seeing Dr Marlou Sa.   She liked the atripla better than the odefsy- she slept better.  mammo normal 08-2015.  HIV 1 RNA Quant (copies/mL)  Date Value  12/04/2015 <20  05/09/2015 <20  10/11/2014 <20   CD4 T Cell Abs (/uL)  Date Value  12/04/2015 670  05/09/2015 560  10/11/2014 640    Review of Systems  Constitutional: Negative for appetite change and unexpected weight change.  Eyes: Negative for visual disturbance.  Respiratory: Negative for cough and shortness of breath.   Cardiovascular: Negative for chest pain and leg swelling.  Gastrointestinal: Negative for constipation and diarrhea.  Genitourinary: Negative for difficulty urinating.  Musculoskeletal: Positive for arthralgias, joint swelling and neck pain.      Objective:   Physical Exam  Constitutional: She appears well-developed and well-nourished.  HENT:  Mouth/Throat: Mucous membranes are dry. No oral lesions. Abnormal dentition. No oropharyngeal exudate.  Eyes: EOM are  normal. Pupils are equal, round, and reactive to light.  Neck: Neck supple.  Cardiovascular: Normal rate, regular rhythm and normal heart sounds.   Pulmonary/Chest: Effort normal and breath sounds normal.  Abdominal: Soft. Bowel sounds are normal. There is no tenderness. There is no rebound.  Musculoskeletal: She exhibits no edema.  Lymphadenopathy:    She has no cervical adenopathy.      Assessment & Plan:

## 2016-06-03 NOTE — Assessment & Plan Note (Addendum)
She appears to be doing well Will check her labs today Has had colon.  Next PAP tomorrow.  Will see her back in 6 months Shingles vax then

## 2016-06-04 LAB — COMPREHENSIVE METABOLIC PANEL
ALT: 15 U/L (ref 6–29)
AST: 18 U/L (ref 10–35)
Albumin: 4.1 g/dL (ref 3.6–5.1)
Alkaline Phosphatase: 56 U/L (ref 33–130)
BILIRUBIN TOTAL: 0.3 mg/dL (ref 0.2–1.2)
BUN: 16 mg/dL (ref 7–25)
CALCIUM: 9.8 mg/dL (ref 8.6–10.4)
CO2: 27 mmol/L (ref 20–31)
CREATININE: 0.85 mg/dL (ref 0.50–0.99)
Chloride: 106 mmol/L (ref 98–110)
GLUCOSE: 114 mg/dL — AB (ref 65–99)
Potassium: 3.8 mmol/L (ref 3.5–5.3)
SODIUM: 142 mmol/L (ref 135–146)
Total Protein: 6.5 g/dL (ref 6.1–8.1)

## 2016-06-04 LAB — CBC
HEMATOCRIT: 35.7 % (ref 35.0–45.0)
Hemoglobin: 11.4 g/dL — ABNORMAL LOW (ref 11.7–15.5)
MCH: 30.1 pg (ref 27.0–33.0)
MCHC: 31.9 g/dL — ABNORMAL LOW (ref 32.0–36.0)
MCV: 94.2 fL (ref 80.0–100.0)
MPV: 10.3 fL (ref 7.5–12.5)
PLATELETS: 251 10*3/uL (ref 140–400)
RBC: 3.79 MIL/uL — ABNORMAL LOW (ref 3.80–5.10)
RDW: 14.2 % (ref 11.0–15.0)
WBC: 3.1 10*3/uL — AB (ref 3.8–10.8)

## 2016-06-04 LAB — T-HELPER CELL (CD4) - (RCID CLINIC ONLY)
CD4 T CELL HELPER: 43 % (ref 33–55)
CD4 T Cell Abs: 430 /uL (ref 400–2700)

## 2016-06-05 ENCOUNTER — Ambulatory Visit (INDEPENDENT_AMBULATORY_CARE_PROVIDER_SITE_OTHER): Payer: Medicare HMO | Admitting: Gynecology

## 2016-06-05 ENCOUNTER — Encounter: Payer: Self-pay | Admitting: Gynecology

## 2016-06-05 VITALS — BP 132/80 | Ht 65.0 in | Wt 135.0 lb

## 2016-06-05 DIAGNOSIS — Z01411 Encounter for gynecological examination (general) (routine) with abnormal findings: Secondary | ICD-10-CM | POA: Diagnosis not present

## 2016-06-05 DIAGNOSIS — M8580 Other specified disorders of bone density and structure, unspecified site: Secondary | ICD-10-CM | POA: Diagnosis not present

## 2016-06-05 DIAGNOSIS — K148 Other diseases of tongue: Secondary | ICD-10-CM

## 2016-06-05 LAB — HIV-1 RNA QUANT-NO REFLEX-BLD
HIV 1 RNA Quant: <20 copies/mL
HIV-1 RNA QUANT, LOG: NOT DETECTED {Log_copies}/mL

## 2016-06-05 LAB — VITAMIN D 25 HYDROXY (VIT D DEFICIENCY, FRACTURES): Vit D, 25-Hydroxy: 37 ng/mL (ref 30–100)

## 2016-06-05 NOTE — Progress Notes (Signed)
Laurie Gray 1955/07/11 [id number]   History:    60 y.o.  for annual gyn exam who early this year had a total thyroidectomy with removal of 2 parotid glands as a result of thyroid cancer and received iodine-131 radiation and she's currently on Synthroid 88 g daily and is being followed also by her endocrinologist. She was complaining of 2 raised areas on her tongue.Review of patient's record indicated that back in 2009. she had laparoscopic bilateral salpingo-oophorectomy. She was on HRT for short time less than a year. She denies any vasomotor symptoms.Patient has spastic dystonia (genetic). Patient was diagnosed with HIV in 2000 and has been followed by Dr. Johnnye Sima infectious disease. She is also being treated for depression and hyperlipidemia. Patient had a benign colon polyp removed in 2009 and states she had a normal colonoscopy in 2016. Review of her Pap smears have demonstrated the following:  2014 normal 2015 low-grade squamous intraepithelial lesion high-risk HPV (subtype 16, 18 and 45 negative) 2016 normal 2017 normal Patient had a bone density study in 2016  Patient many years ago had an abdominal hysterectomy as a result of symptomatic leiomyomatous uteri.  Past medical history,surgical history, family history and social history were all reviewed and documented in the EPIC chart.  Gynecologic History No LMP recorded. Patient has had a hysterectomy. Contraception: status post hysterectomy Last Pap: 2017. Results were: normal Last mammogram: 2017. Results were: normal  Obstetric History OB History  Gravida Para Term Preterm AB Living  2 2       2   SAB TAB Ectopic Multiple Live Births               # Outcome Date GA Lbr Len/2nd Weight Sex Delivery Anes PTL Lv  2 Para           1 Para                ROS: A ROS was performed and pertinent positives and negatives are included in the history.  GENERAL: No fevers or chills. HEENT: No change in vision, no earache, sore  throat or sinus congestion. NECK: No pain or stiffness. CARDIOVASCULAR: No chest pain or pressure. No palpitations. PULMONARY: No shortness of breath, cough or wheeze. GASTROINTESTINAL: No abdominal pain, nausea, vomiting or diarrhea, melena or bright red blood per rectum. GENITOURINARY: No urinary frequency, urgency, hesitancy or dysuria. MUSCULOSKELETAL: No joint or muscle pain, no back pain, no recent trauma. DERMATOLOGIC: No rash, no itching, no lesions. ENDOCRINE: No polyuria, polydipsia, no heat or cold intolerance. No recent change in weight. HEMATOLOGICAL: No anemia or easy bruising or bleeding. NEUROLOGIC: No headache, seizures, numbness, tingling or weakness. PSYCHIATRIC: No depression, no loss of interest in normal activity or change in sleep pattern.     Exam: chaperone present  BP 132/80   Ht 5\' 5"  (1.651 m)   Wt 135 lb (61.2 kg)   BMI 22.47 kg/m   Body mass index is 22.47 kg/m.  General appearance : Well developed well nourished female. No acute distress HEENT: Eyes: no retinal hemorrhage or exudates,  Neck supple, trachea midline, no carotid bruits, no thyroidmegaly,Two pale raised nodules at the Center of her tongue Lungs: Clear to auscultation, no rhonchi or wheezes, or rib retractions  Heart: Regular rate and rhythm, no murmurs or gallops Breast:Examined in sitting and supine position were symmetrical in appearance, no palpable masses or tenderness,  no skin retraction, no nipple inversion, no nipple discharge, no skin discoloration, no axillary or supraclavicular  lymphadenopathy Abdomen: no palpable masses or tenderness, no rebound or guarding Extremities: no edema or skin discoloration or tenderness  Pelvic:  Bartholin, Urethra, Skene Glands: Within normal limits             Vagina: No gross lesions or discharge  Cervix: Absent  Uterus absent  Adnexa  Without masses or tenderness  Anus and perineum  normal   Rectovaginal  normal sphincter tone without palpated masses  or tenderness             Hemoccult PCP will provide     Assessment/Plan:  61 y.o. female for annual exam with history of osteopenia will be scheduling her bone density study here in the office in the next few weeks. Patient will be referred to the medical oncologist for further evaluation of these tongue lesions which may be a result from the radiation that she recently received and will need further evaluation possible biopsy. Because her osteopenia will check a vitamin D level today. Her endocrinologist and PCP has been doing the remainder of her blood work. We discussed importance of calcium vitamin D and weightbearing exercises for osteoporosis prevention. Patient will continue to follow with Dr. Johnnye Sima infectious disease as a result of patient's HIV history. Pap smear was done today.   Terrance Mass MD, 12:35 PM 06/05/2016

## 2016-06-05 NOTE — Patient Instructions (Signed)
Bone Densitometry Bone densitometry is an imaging test that uses a special X-ray to measure the amount of calcium and other minerals in your bones (bone density). This test is also known as a bone mineral density test or dual-energy X-ray absorptiometry (DXA). The test can measure bone density at your hip and your spine. It is similar to having a regular X-ray. You may have this test to:  Diagnose a condition that causes weak or thin bones (osteoporosis).  Predict your risk of a broken bone (fracture).  Determine how well osteoporosis treatment is working.  Tell a health care provider about:  Any allergies you have.  All medicines you are taking, including vitamins, herbs, eye drops, creams, and over-the-counter medicines.  Any problems you or family members have had with anesthetic medicines.  Any blood disorders you have.  Any surgeries you have had.  Any medical conditions you have.  Possibility of pregnancy.  Any other medical test you had within the previous 14 days that used contrast material. What are the risks? Generally, this is a safe procedure. However, problems can occur and may include the following:  This test exposes you to a very small amount of radiation.  The risks of radiation exposure may be greater to unborn children.  What happens before the procedure?  Do not take any calcium supplements for 24 hours before having the test. You can otherwise eat and drink what you usually do.  Take off all metal jewelry, eyeglasses, dental appliances, and any other metal objects. What happens during the procedure?  You may lie on an exam table. There will be an X-ray generator below you and an imaging device above you.  Other devices, such as boxes or braces, may be used to position your body properly for the scan.  You will need to lie still while the machine slowly scans your body.  The images will show up on a computer monitor. What happens after the  procedure? You may need more testing at a later time. This information is not intended to replace advice given to you by your health care provider. Make sure you discuss any questions you have with your health care provider. Document Released: 01/15/2004 Document Revised: 05/31/2015 Document Reviewed: 06/02/2013 Elsevier Interactive Patient Education  2018 Elsevier Inc.   

## 2016-06-06 ENCOUNTER — Telehealth: Payer: Self-pay | Admitting: *Deleted

## 2016-06-06 DIAGNOSIS — K148 Other diseases of tongue: Secondary | ICD-10-CM

## 2016-06-06 LAB — PAP IG W/ RFLX HPV ASCU

## 2016-06-06 NOTE — Telephone Encounter (Signed)
-----   Message from Terrance Mass, MD sent at 06/05/2016  2:10 PM EDT ----- [name], please disregard this message if I had already since you prior message. This patient needs an appointment with medical oncologist patient with 2 tongue lesions. Patient with history of HIV, early this year she had a total thyroidectomy and removal of 2 parathyroid glands as a result of thyroid cancer and received radiation therapy also. Thank you

## 2016-06-06 NOTE — Telephone Encounter (Signed)
Referral placed for [facility] they will contact pt to schedule.

## 2016-06-09 ENCOUNTER — Ambulatory Visit: Payer: Medicare HMO | Admitting: Licensed Clinical Social Worker

## 2016-06-09 NOTE — Telephone Encounter (Signed)
Per oncologist office "Per Dr. Alvy Bimler, pt needs to see Dr. Constance Holster for bx prior to being seen in our office"    I will let patient know that she will need to follow up with ENT

## 2016-06-09 NOTE — Telephone Encounter (Signed)
Okay thank you

## 2016-06-09 NOTE — Addendum Note (Signed)
Addendum  created 06/09/16 1022 by Myrtie Soman, MD   Sign clinical note

## 2016-06-10 NOTE — Telephone Encounter (Signed)
Pt scheduled on 07/01/16 @ 2:50pm with Dr.Rosen, left message for pt to call

## 2016-06-12 ENCOUNTER — Telehealth: Payer: Self-pay | Admitting: Licensed Clinical Social Worker

## 2016-06-12 NOTE — Telephone Encounter (Signed)
Spoke with patient and attempted to make appointment due to missed appointment on 6/4.  Patient stated that she was on the other phone line and that she would call back to reschedule.  Sande Rives, [facility]

## 2016-06-17 NOTE — Telephone Encounter (Signed)
Pt informed

## 2016-06-17 NOTE — Progress Notes (Signed)
  Name: Laurie Gray  MRN: [id number]  Date: 06/03/16  Time started: 3:40 pm     Time ended: 3:50 pm  Behavior: Restless Mood: Anxious Affect: Within normal range Thought Process: Coherent Thought Content: Logical Thought Disorder and Perceptual Anomalies: None observed or self-reported S/I and H/I: None observed  Referral Source: Dr. Johnnye Sima  Content of Session: Met with patient based on referral, due to past history of receiving mental health treatment.  Intervention Provided: Introduced self and explained new role of the [facility].  Encouraged patient to schedule appointment for [facility] [place].  Response: Patient was receptive to meeting new [facility] and was receptive that long-term counseling would not be provided.  Patient was receptive that [facility] would be focused on symptomology and functioning.  Patient reported having recent cancer surgery on her throat (removal of thyroid) and having other current medical issues.  Patient denied being "clinically depressed" but stated she was sad due to medical issues. Patient explained history of receiving counseling services with another provider within the office.  Plan: Assess current symptoms and functioning and assist patient with locating another counseling provider within the community for long-term mental health needs.  Appointment was scheduled for Monday June 4th at 2:30 pm.  Sande Rives, [facility]

## 2016-06-18 ENCOUNTER — Encounter (INDEPENDENT_AMBULATORY_CARE_PROVIDER_SITE_OTHER): Payer: Medicare HMO | Admitting: Ophthalmology

## 2016-06-19 ENCOUNTER — Other Ambulatory Visit: Payer: Self-pay | Admitting: Nurse Practitioner

## 2016-06-24 ENCOUNTER — Ambulatory Visit (INDEPENDENT_AMBULATORY_CARE_PROVIDER_SITE_OTHER): Payer: Medicare HMO

## 2016-06-24 ENCOUNTER — Other Ambulatory Visit: Payer: Self-pay | Admitting: Gynecology

## 2016-06-24 DIAGNOSIS — M8589 Other specified disorders of bone density and structure, multiple sites: Secondary | ICD-10-CM | POA: Diagnosis not present

## 2016-06-24 DIAGNOSIS — Z780 Asymptomatic menopausal state: Secondary | ICD-10-CM | POA: Diagnosis not present

## 2016-06-24 DIAGNOSIS — M8580 Other specified disorders of bone density and structure, unspecified site: Secondary | ICD-10-CM

## 2016-06-26 DIAGNOSIS — H2513 Age-related nuclear cataract, bilateral: Secondary | ICD-10-CM | POA: Diagnosis not present

## 2016-06-26 DIAGNOSIS — Z21 Asymptomatic human immunodeficiency virus [HIV] infection status: Secondary | ICD-10-CM | POA: Diagnosis not present

## 2016-06-26 DIAGNOSIS — H34832 Tributary (branch) retinal vein occlusion, left eye, with macular edema: Secondary | ICD-10-CM | POA: Diagnosis not present

## 2016-06-26 DIAGNOSIS — H35373 Puckering of macula, bilateral: Secondary | ICD-10-CM | POA: Diagnosis not present

## 2016-06-26 DIAGNOSIS — H35352 Cystoid macular degeneration, left eye: Secondary | ICD-10-CM | POA: Diagnosis not present

## 2016-06-29 ENCOUNTER — Other Ambulatory Visit: Payer: Self-pay | Admitting: Nurse Practitioner

## 2016-07-01 DIAGNOSIS — C73 Malignant neoplasm of thyroid gland: Secondary | ICD-10-CM | POA: Diagnosis not present

## 2016-07-01 DIAGNOSIS — J383 Other diseases of vocal cords: Secondary | ICD-10-CM | POA: Diagnosis not present

## 2016-07-18 ENCOUNTER — Other Ambulatory Visit: Payer: Self-pay | Admitting: Nurse Practitioner

## 2016-08-22 ENCOUNTER — Other Ambulatory Visit: Payer: Self-pay | Admitting: Nurse Practitioner

## 2016-08-23 ENCOUNTER — Encounter (HOSPITAL_COMMUNITY): Payer: Self-pay

## 2016-08-23 ENCOUNTER — Emergency Department (HOSPITAL_COMMUNITY)
Admission: EM | Admit: 2016-08-23 | Discharge: 2016-08-23 | Disposition: A | Discharge status: Home / Self Care | Payer: Medicare HMO | Attending: Emergency Medicine | Admitting: Emergency Medicine

## 2016-08-23 DIAGNOSIS — N3000 Acute cystitis without hematuria: Secondary | ICD-10-CM

## 2016-08-23 DIAGNOSIS — Z79899 Other long term (current) drug therapy: Secondary | ICD-10-CM | POA: Insufficient documentation

## 2016-08-23 DIAGNOSIS — E039 Hypothyroidism, unspecified: Secondary | ICD-10-CM | POA: Insufficient documentation

## 2016-08-23 DIAGNOSIS — K0889 Other specified disorders of teeth and supporting structures: Secondary | ICD-10-CM

## 2016-08-23 DIAGNOSIS — I10 Essential (primary) hypertension: Secondary | ICD-10-CM | POA: Diagnosis not present

## 2016-08-23 LAB — BASIC METABOLIC PANEL
Anion gap: 11 (ref 5–15)
BUN: 10 mg/dL (ref 6–20)
CALCIUM: 9.6 mg/dL (ref 8.9–10.3)
CO2: 27 mmol/L (ref 22–32)
CREATININE: 1.11 mg/dL — AB (ref 0.44–1.00)
Chloride: 99 mmol/L — ABNORMAL LOW (ref 101–111)
GFR calc non Af Amer: 53 mL/min — ABNORMAL LOW (ref >60)
Glucose, Bld: 139 mg/dL — ABNORMAL HIGH (ref 65–99)
Potassium: 3.3 mmol/L — ABNORMAL LOW (ref 3.5–5.1)
SODIUM: 137 mmol/L (ref 135–145)

## 2016-08-23 LAB — CBC
HCT: 36.3 % (ref 36.0–46.0)
HEMOGLOBIN: 12.4 g/dL (ref 12.0–15.0)
MCH: 31.2 pg (ref 26.0–34.0)
MCHC: 34.2 g/dL (ref 30.0–36.0)
MCV: 91.2 fL (ref 78.0–100.0)
PLATELETS: 173 10*3/uL (ref 150–400)
RBC: 3.98 MIL/uL (ref 3.87–5.11)
RDW: 13.1 % (ref 11.5–15.5)
WBC: 5.5 10*3/uL (ref 4.0–10.5)

## 2016-08-23 LAB — URINALYSIS, ROUTINE W REFLEX MICROSCOPIC
BILIRUBIN URINE: NEGATIVE
Glucose, UA: NEGATIVE mg/dL
Ketones, ur: 5 mg/dL — AB
NITRITE: NEGATIVE
PH: 5 (ref 5.0–8.0)
Protein, ur: NEGATIVE mg/dL
SPECIFIC GRAVITY, URINE: 1.005 (ref 1.005–1.030)

## 2016-08-23 MED ORDER — OXYCODONE-ACETAMINOPHEN 5-325 MG PO TABS: 1.0000 | ORAL_TABLET | Freq: Four times a day (QID) | ORAL | 0 refills | Status: DC | PRN

## 2016-08-23 MED ORDER — CEPHALEXIN 500 MG PO CAPS: 500.0000 mg | ORAL_CAPSULE | Freq: Four times a day (QID) | ORAL | 0 refills | Status: DC

## 2016-08-23 MED ORDER — HYDROMORPHONE HCL 1 MG/ML IJ SOLN
1.0000 mg | Freq: Once | INTRAMUSCULAR | Status: AC
  Administered 2016-08-23: 1 mg via INTRAMUSCULAR
  Filled 2016-08-23: qty 1

## 2016-08-23 MED ORDER — HYDROMORPHONE HCL 1 MG/ML IJ SOLN
1.0000 mg | Freq: Once | INTRAMUSCULAR | Status: DC
  Filled 2016-08-23: qty 1

## 2016-08-23 MED ORDER — OXYCODONE-ACETAMINOPHEN 5-325 MG PO TABS
1.0000 | ORAL_TABLET | ORAL | Status: AC | PRN
  Administered 2016-08-23 (×2): 1 via ORAL
  Filled 2016-08-23 (×2): qty 1

## 2016-08-23 MED ORDER — CEPHALEXIN 500 MG PO CAPS
500.0000 mg | ORAL_CAPSULE | Freq: Once | ORAL | Status: AC
  Administered 2016-08-23: 500 mg via ORAL
  Filled 2016-08-23: qty 1

## 2016-08-23 NOTE — ED Notes (Signed)
Pt given ice pack

## 2016-08-23 NOTE — ED Provider Notes (Signed)
[facility] DEPT Provider Note   CSN: [id number] Arrival date & time: 08/23/16  1448     History   Chief Complaint Chief Complaint  Patient presents with  . Dental Pain    HPI Laurie Gray is a 61 y.o. female.  HPI Patient presents to the emergency room for evaluation of dental pain. Patient states she had a root canal procedure performed earlier this week in her left upper lateral incisor.  Patient was taking over-the-counter pain medications. The pain is increasing in her tooth. He was unable to call her dentist because the weekend. She denies any fevers or chills but feels like there is something draining.  Patient also mentioned she is having urinary frequency and some burning when she urinates.  No vomiting or diarrhea. No abdominal pain. Past Medical History:  Diagnosis Date  . Anemia   . Anxiety    hx of-not on any meds  . Arthritis    "shoulders, arms, fingers, neck, back" (01/31/2016)  . Chronic cervical pain   . History of bronchitis   . History of colon polyps   . History of kidney stones   . HIV infection ([place]) dx'd 2000   takes odefsey  . Hyperlipidemia    takes Pravastatin daily  . Hypertension    takes Benicar HCT daily  . Hyperthyroidism   . Hypothyroidism   . Internal hemorrhoids   . Joint pain   . Migraine    "in the past" (01/31/2016)  . Pituitary adenoma ([employer])   . Refusal of blood transfusions as patient is Jehovah's Witness    "but substitutes are fine". (01/31/2016)  . Thyroid cancer ([place]) 12/2015  . Upper back pain     Patient Active Problem List   Diagnosis Date Noted  . Chronic right shoulder pain 02/18/2016  . Chronic left shoulder pain 02/18/2016  . Neck pain 02/18/2016  . 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 Status post surgery 05/29/2015  . Hammertoe 04/02/2015  . S/P bunionectomy 11/08/2014  . Arthritis 07/07/2014  . Osteopenia 04/19/2014  . Routine general medical examination at a health care facility  03/28/2014  . Depression 11/02/2013  . Hyperlipidemia 11/02/2013  . Vaginal dysplasia 04/28/2013  . Vaginal atrophy 04/06/2013  . Right hip pain 08/23/2012  . Pain, dental 04/07/2012  . Vaginal bleeding 02/17/2012  . 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 Headache 12/01/2011  . Pharyngitis 07/21/2011  . Cellulitis 07/03/2010  . 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 Disorder of bone and cartilage 09/27/2009  . HAND PAIN, RIGHT 09/24/2009  . Acute URI 01/08/2009  . ESSENTIAL HYPERTENSION, BENIGN 07/27/2008  . BREAST MASS 07/03/2008  . Human immunodeficiency virus (HIV) disease ([place]) 04/03/2008  . HSV 04/03/2008  . 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 ABDOMINAL HYSTERECTOMY    . BUNIONECTOMY Right   . COLONOSCOPY    . CYSTOSCOPY    . HAMMER TOE SURGERY Left    "middle toe was broke; turned into hammertoe then repaired"  . HAMMER TOE SURGERY Bilateral    "both small toes"  . PARATHYROIDECTOMY Right 01/31/2016  . PARATHYROIDECTOMY Right 01/31/2016   Procedure: PARATHYROIDECTOMY ON RIGHT SIDE;  Surgeon: Izora Gala, MD;  Location: [facility];  Service: ENT;  Laterality: Right;  . SALPINGOOPHORECTOMY Bilateral   . THYROIDECTOMY N/A 01/31/2016   Procedure: TOTAL THYROIDECTOMY;  Surgeon: Izora Gala, MD;  Location: [facility];  Service: ENT;  Laterality: N/A;  . TOE SURGERY  bunion  . TONSILLECTOMY     as a child  . TOTAL THYROIDECTOMY  01/31/2016  . TUBAL LIGATION     30 yrs ago    OB History    Gravida Para Term Preterm AB Living   2 2       2    SAB TAB Ectopic Multiple Live Births                   Home Medications    Prior to Admission medications   Medication Sig Start Date End Date Taking? Authorizing Provider  Cyanocobalamin (B-12 PO) Take 1 tablet by mouth daily.   Yes [provider]  FIBER ADULT GUMMIES PO Take 2 tablets by mouth daily.   Yes [provider]  Glucosamine-Chondroitin (MOVE FREE PO)  Take 1 tablet by mouth daily.   Yes [provider]  hydrocortisone (PROCTO-PAK) 1 % CREA Twice daily as needed for hemorrhoids 05/20/16  Yes Lauree Chandler, NP  levothyroxine (SYNTHROID, LEVOTHROID) 88 MCG tablet Take 88 mcg by mouth daily before breakfast.   Yes [provider]  Menthol-Camphor (ICY HOT ADVANCED RELIEF) 16-11 % CREA Apply 1 application topically 4 (four) times daily as needed (for shoulder pain).   Yes [provider]  Multiple Vitamins-Minerals (MULTIVITAMIN ADULTS 50+ PO) Take 1 tablet by mouth daily.   Yes [provider]  ODEFSEY 200-25-25 MG TABS tablet TAKE 1 TABLET BY MOUTH DAILY WITH BREAKFAST. Patient taking differently: TAKE 1 TABLET BY MOUTH DAILY WITH FOOD 05/20/16  Yes Campbell Riches, MD  olmesartan-hydrochlorothiazide (BENICAR HCT) 20-12.5 MG tablet TAKE 1 TABLET BY MOUTH DAILY. 01/09/16  Yes Lauree Chandler, NP  omega-3 acid ethyl esters (LOVAZA) 1 g capsule TAKE 1 CAPSULE EVERY DAY 03/12/16  Yes Campbell Riches, MD  traMADol (ULTRAM) 50 MG tablet TAKE 1 TABLET BY MOUTH 3 TIMES A DAY AS NEEDED FOR PAIN 08/22/16  Yes Lauree Chandler, NP  cephALEXin (KEFLEX) 500 MG capsule Take 1 capsule (500 mg total) by mouth 4 (four) times daily. 08/23/16   Dorie Rank, MD  naproxen sodium (ANAPROX) 220 MG tablet Take 220 mg by mouth at bedtime.    [provider]  oxyCODONE-acetaminophen (PERCOCET) 5-325 MG tablet Take 1 tablet by mouth every 6 (six) hours as needed. 08/23/16   Dorie Rank, MD  pravastatin (PRAVACHOL) 40 MG tablet Take one tablet by mouth once daily for cholesterol 09/26/15   Lauree Chandler, NP    Family History Family History  Problem Relation Age of Onset  . Breast cancer Mother   . Hypertension Mother   . Cancer Mother   . Hyperlipidemia Mother   . Stroke Mother   . Colon cancer Neg Hx     Social History Social History  Substance Use Topics  . Smoking status: Never Smoker  . Smokeless tobacco:  Never Used  . Alcohol use 0.0 oz/week     Comment: 01/31/2016 "might go out to eat and have a drink once/month; no more than that"     Allergies   Sulfonamide derivatives   Review of Systems Review of Systems  Constitutional: Negative for fever.  Genitourinary: Positive for dysuria.  All other systems reviewed and are negative.    Physical Exam Updated Vital Signs BP 140/82 (BP Location: Left Arm)   Pulse 72   Temp 98.8 F (37.1 C) (Oral)   Resp 18   SpO2 96%   Physical Exam  Constitutional: She appears well-developed and  well-nourished. No distress.  HENT:  Head: Normocephalic and atraumatic.  Right Ear: External ear normal.  Left Ear: External ear normal.  Mouth/Throat: No oropharyngeal exudate.  No erythema or drainage, no gingival swelling, no obvious dental abnormality, no submandibular swelling  Eyes: Conjunctivae are normal. Right eye exhibits no discharge. Left eye exhibits no discharge. No scleral icterus.  Neck: Neck supple. No tracheal deviation present.  Cardiovascular: Normal rate.   Pulmonary/Chest: Effort normal. No stridor. No respiratory distress.  Abdominal: She exhibits no distension and no mass. There is no tenderness. There is no guarding.  Musculoskeletal: She exhibits no edema.  Neurological: She is alert. Cranial nerve deficit: no gross deficits.  Skin: Skin is warm and dry. No rash noted.  Psychiatric: She has a normal mood and affect.  Nursing note and vitals reviewed.    ED Treatments / Results  Labs (all labs ordered are listed, but only abnormal results are displayed) Labs Reviewed  BASIC METABOLIC PANEL - Abnormal; Notable for the following:       Result Value   Potassium 3.3 (*)    Chloride 99 (*)    Glucose, Bld 139 (*)    Creatinine, Ser 1.11 (*)    GFR calc non Af Amer 53 (*)    All other components within normal limits  URINALYSIS, ROUTINE W REFLEX MICROSCOPIC - Abnormal; Notable for the following:    Hgb urine dipstick  SMALL (*)    Ketones, ur 5 (*)    Leukocytes, UA MODERATE (*)    Bacteria, UA MANY (*)    Squamous Epithelial / LPF 0-5 (*)    All other components within normal limits  CBC     Radiology No results found.  Procedures Procedures (including critical care time)  Medications Ordered in ED Medications  oxyCODONE-acetaminophen (PERCOCET/ROXICET) 5-325 MG per tablet 1 tablet (1 tablet Oral Given 08/23/16 2136)  HYDROmorphone (DILAUDID) injection 1 mg (1 mg Intramuscular Given 08/23/16 1934)  cephALEXin (KEFLEX) capsule 500 mg (500 mg Oral Given 08/23/16 2133)     Initial Impression / Assessment and Plan / ED Course  I have reviewed the triage vital signs and the nursing notes.  Pertinent labs & imaging results that were available during my care of the patient were reviewed by me and considered in my medical decision making (see chart for details).  Clinical Course as of Aug 25 1500  Sat Aug 23, 2016  1859 30 day supply of tramadol monthly.  Last refill on 7/16  [JK]  2030 Complaining of persistent pain.  Labs reassuring.  No external signs of infection or drainage.    [JK]    Clinical Course User Index [JK] Dorie Rank, MD   No obvious signs of infection on exam.  Dc home on oral abx.  Follow up with her dentist.  Pt also mentioned urinary sx.  UA suggest uti. No signs of systemic infection.  Final Clinical Impressions(s) / ED Diagnoses   Final diagnoses:  Pain, dental  Acute cystitis without hematuria    New Prescriptions Discharge Medication List as of 08/23/2016  9:06 PM    START taking these medications   Details  cephALEXin (KEFLEX) 500 MG capsule Take 1 capsule (500 mg total) by mouth 4 (four) times daily., Starting Sat 08/23/2016, Print    oxyCODONE-acetaminophen (PERCOCET) 5-325 MG tablet Take 1 tablet by mouth every 6 (six) hours as needed., Starting Sat 08/23/2016, Print         Dorie Rank, MD 08/25/16 [phone]

## 2016-08-23 NOTE — Discharge Instructions (Signed)
Follow-up with your dentist, call the office on 1 day, start taking the antibiotics as prescribed  Follow-up with your  primary care doctor to make sure the urinary tract infection resolves.

## 2016-08-23 NOTE — ED Triage Notes (Addendum)
Pt reports L sided face/ dental pain following a root canal of her L upper incisor. She is supposed to return next week for a permanent cap, but she states that the pain has increased. She reports taking 600mg   Ibuprofen every 6 hours, but it is not providing relief. She states that she was not given any pain medication from her dentist. Pt also endorses drainage. A&Ox4. She reports that past root canals have never felt this way.

## 2016-09-01 ENCOUNTER — Ambulatory Visit (INDEPENDENT_AMBULATORY_CARE_PROVIDER_SITE_OTHER): Payer: Medicare HMO | Admitting: Nurse Practitioner

## 2016-09-01 ENCOUNTER — Encounter: Payer: Self-pay | Admitting: Nurse Practitioner

## 2016-09-01 VITALS — BP 132/78 | HR 63 | Temp 98.5°F | Resp 17 | Ht 65.0 in | Wt 136.6 lb

## 2016-09-01 DIAGNOSIS — N390 Urinary tract infection, site not specified: Secondary | ICD-10-CM

## 2016-09-01 DIAGNOSIS — E785 Hyperlipidemia, unspecified: Secondary | ICD-10-CM

## 2016-09-01 DIAGNOSIS — K0889 Other specified disorders of teeth and supporting structures: Secondary | ICD-10-CM

## 2016-09-01 NOTE — Patient Instructions (Signed)
Complete antibiotics   Schedule fasting lab work before your next appt

## 2016-09-01 NOTE — Progress Notes (Signed)
Careteam: Patient Care Team: Lauree Chandler, NP as PCP - General (Geriatric Medicine) Campbell Riches, MD as PCP - Infectious Diseases (Infectious Diseases) Jackolyn Confer, MD as Consulting Physician (General Surgery)  Advanced Directive information Does Patient Have a Medical Advance Directive?: No  Allergies  Allergen Reactions  . Sulfonamide Derivatives Swelling    SWELLING REACTION UNSPECIFIED     Chief Complaint  Patient presents with  . Follow-up    Pt is being seen for a ED follow up. Pt was seen at [facility] on 08/23/16 due to dental pain and UTI.      HPI: Patient is a 61 y.o. female seen in the office today to follow up ED visit on 8/18 due to dental pain without signs of infections and was told to follow up with dentist. Also UA showed signs of UTI so she was placed on Keflex. No culture was sent. Has 3 tablets left. Symptoms were increase urinary frequency with suprapubic tenderness which has improved.   Dental pain is doing better- has subsided. Swelling also improved. has an appt scheduled in a few weeks. No fevers or chills.   When she goes to sleep her right arm swells and has pain in the hand. Reports neck pain too-  Told orthopedic about this and he did xrays did xrays and MRI of neck. Sent her to PT and gave her steroid injection  Better during the day- no swelling at this time.  Reports she sleeps on her shoulders- unable to sleep on her back.   Feels like her thyroid is off but has an appt with endocrinologist to have this check.   Has not been taking her statin- feels like it is causing her muscle joint pain and weakness.  Taking omega 3   Review of Systems:  Review of Systems  Constitutional: Positive for malaise/fatigue. Negative for chills, fever and weight loss.  Respiratory: Negative for cough, sputum production and shortness of breath.   Cardiovascular: Negative for chest pain, palpitations and leg swelling.  Gastrointestinal:  Negative for abdominal pain, constipation, diarrhea and heartburn.  Genitourinary: Negative for dysuria, flank pain, frequency, hematuria and urgency.  Musculoskeletal: Positive for back pain, joint pain and myalgias. Negative for falls.  Skin: Negative.   Neurological: Negative for dizziness and headaches.  Psychiatric/Behavioral: Negative for depression and memory loss. The patient is nervous/anxious. The patient does not have insomnia.     Past Medical History:  Diagnosis Date  . Anemia   . Anxiety    hx of-not on any meds  . Arthritis    "shoulders, arms, fingers, neck, back" (01/31/2016)  . Chronic cervical pain   . History of bronchitis   . History of colon polyps   . History of kidney stones   . HIV infection ([place]) dx'd 2000   takes odefsey  . Hyperlipidemia    takes Pravastatin daily  . Hypertension    takes Benicar HCT daily  . Hyperthyroidism   . Hypothyroidism   . Internal hemorrhoids   . Joint pain   . Migraine    "in the past" (01/31/2016)  . Pituitary adenoma ([place])   . Refusal of blood transfusions as patient is Jehovah's Witness    "but substitutes are fine". (01/31/2016)  . Thyroid cancer ([place]) 12/2015  . Upper back pain    Past Surgical History:  Procedure Laterality Date  . ABDOMINAL HYSTERECTOMY    . BUNIONECTOMY Right   . COLONOSCOPY    . CYSTOSCOPY    .  HAMMER TOE SURGERY Left    "middle toe was broke; turned into hammertoe then repaired"  . HAMMER TOE SURGERY Bilateral    "both small toes"  . PARATHYROIDECTOMY Right 01/31/2016  . PARATHYROIDECTOMY Right 01/31/2016   Procedure: PARATHYROIDECTOMY ON RIGHT SIDE;  Surgeon: Izora Gala, MD;  Location: [facility];  Service: ENT;  Laterality: Right;  . SALPINGOOPHORECTOMY Bilateral   . THYROIDECTOMY N/A 01/31/2016   Procedure: TOTAL THYROIDECTOMY;  Surgeon: Izora Gala, MD;  Location: [facility];  Service: ENT;  Laterality: N/A;  . TOE SURGERY     bunion  . TONSILLECTOMY     as a child  . TOTAL THYROIDECTOMY   01/31/2016  . TUBAL LIGATION     30 yrs ago   Social History:   reports that she has never smoked. She has never used smokeless tobacco. She reports that she drinks alcohol. She reports that she does not use drugs.  Family History  Problem Relation Age of Onset  . Breast cancer Mother   . Hypertension Mother   . Cancer Mother   . Hyperlipidemia Mother   . Stroke Mother   . Colon cancer Neg Hx     Medications: Patient's Medications  New Prescriptions   No medications on file  Previous Medications   CYANOCOBALAMIN (B-12 PO)    Take 1 tablet by mouth daily.   FIBER ADULT GUMMIES PO    Take 2 tablets by mouth daily.   GLUCOSAMINE-CHONDROITIN (MOVE FREE PO)    Take 1 tablet by mouth daily.   HYDROCORTISONE (PROCTO-PAK) 1 % CREA    Twice daily as needed for hemorrhoids   LEVOTHYROXINE (SYNTHROID, LEVOTHROID) 88 MCG TABLET    Take 88 mcg by mouth daily before breakfast.   MENTHOL-CAMPHOR (ICY HOT ADVANCED RELIEF) 16-11 % CREA    Apply 1 application topically 4 (four) times daily as needed (for shoulder pain).   MULTIPLE VITAMINS-MINERALS (MULTIVITAMIN ADULTS 50+ PO)    Take 1 tablet by mouth daily.   NAPROXEN SODIUM (ANAPROX) 220 MG TABLET    Take 220 mg by mouth at bedtime.   ODEFSEY 200-25-25 MG TABS TABLET    TAKE 1 TABLET BY MOUTH DAILY WITH BREAKFAST.   OLMESARTAN-HYDROCHLOROTHIAZIDE (BENICAR HCT) 20-12.5 MG TABLET    TAKE 1 TABLET BY MOUTH DAILY.   OMEGA-3 ACID ETHYL ESTERS (LOVAZA) 1 G CAPSULE    TAKE 1 CAPSULE EVERY DAY   PRAVASTATIN (PRAVACHOL) 40 MG TABLET    Take one tablet by mouth once daily for cholesterol   TRAMADOL (ULTRAM) 50 MG TABLET    TAKE 1 TABLET BY MOUTH 3 TIMES A DAY AS NEEDED FOR PAIN  Modified Medications   No medications on file  Discontinued Medications   CEPHALEXIN (KEFLEX) 500 MG CAPSULE    Take 1 capsule (500 mg total) by mouth 4 (four) times daily.   OXYCODONE-ACETAMINOPHEN (PERCOCET) 5-325 MG TABLET    Take 1 tablet by mouth every 6 (six) hours as  needed.     Physical Exam:  Vitals:   09/01/16 1027  BP: 132/78  Pulse: 63  Resp: 17  Temp: 98.5 F (36.9 C)  TempSrc: Oral  SpO2: 97%  Weight: 136 lb 9.6 oz (62 kg)  Height: 5\' 5"  (1.651 m)   Body mass index is 22.73 kg/m.  Physical Exam  Constitutional: She is oriented to person, place, and time. She appears well-developed and well-nourished. No distress.  HENT:  Head: Normocephalic and atraumatic.  Mouth/Throat: Oropharynx is clear and moist. No  oropharyngeal exudate.  Cardiovascular: Normal rate, regular rhythm and normal heart sounds.   Pulmonary/Chest: Effort normal and breath sounds normal.  Abdominal: Soft. Bowel sounds are normal.  Musculoskeletal: She exhibits tenderness. She exhibits no edema.       Right shoulder: She exhibits decreased range of motion, tenderness, bony tenderness and decreased strength. She exhibits no swelling, no effusion and no crepitus.       Left shoulder: She exhibits decreased range of motion, tenderness, bony tenderness, pain and decreased strength.  decrease strength and ROM bilaterally but has improved with PT  Neurological: She is alert and oriented to person, place, and time.  Skin: Skin is warm and dry. She is not diaphoretic.  Psychiatric: Her mood appears anxious.   Labs reviewed: Basic Metabolic Panel:  Recent Labs  05/20/16 1403 06/03/16 1603 08/23/16 1914  NA 139 142 137  K 3.5 3.8 3.3*  CL 101 106 99*  CO2 28 27 27   GLUCOSE 70 114* 139*  BUN 19 16 10   CREATININE 0.86 0.85 1.11*  CALCIUM 10.0 9.8 9.6   Liver Function Tests:  Recent Labs  01/24/16 1325 05/20/16 1403 06/03/16 1603  AST 20 17 18   ALT 15 15 15   ALKPHOS 48 58 56  BILITOT 0.3 0.4 0.3  PROT 6.6 7.3 6.5  ALBUMIN 4.1 4.4 4.1   No results for input(s): LIPASE, AMYLASE in the last 8760 hours. No results for input(s): AMMONIA in the last 8760 hours. CBC:  Recent Labs  11/19/15 1516  01/24/16 1325 06/03/16 1603 08/23/16 1914  WBC 3.7*  <  > 4.7 3.1* 5.5  NEUTROABS 1,591  --   --   --   --   HGB 11.9  < > 12.0 11.4* 12.4  HCT 36.3  < > 36.5 35.7 36.3  MCV 92.4  < > 93.1 94.2 91.2  PLT 193  < > 187 251 173  < > = values in this interval not displayed. Lipid Panel:  Recent Labs  12/04/15 1623  CHOL 214*  HDL 72  LDLCALC 118*  TRIG 119  CHOLHDL 3.0   TSH: No results for input(s): TSH in the last 8760 hours. A1C: Lab Results  Component Value Date   HGBA1C 5.6 11/19/2015     Assessment/Plan 1. Urinary tract infection without hematuria, site unspecified To complete keflex. Symptoms as improved  2. Pain, dental Improved since ED visit.   3. Hyperlipidemia, unspecified hyperlipidemia type Could not tolerate pravastatin due to myalgias; will remove from medication list. conts on [place]. Will follow up fasting blood work before next [place]   Next appt: as scheduled. To keep follow up with dentist.  Carlos American. Harle Battiest  [facility] [phone] 8 am - 5 pm) [phone] (after hours)

## 2016-09-10 ENCOUNTER — Other Ambulatory Visit: Payer: Self-pay | Admitting: Infectious Diseases

## 2016-09-10 DIAGNOSIS — B20 Human immunodeficiency virus [HIV] disease: Secondary | ICD-10-CM

## 2016-09-16 ENCOUNTER — Other Ambulatory Visit: Payer: Self-pay | Admitting: Nurse Practitioner

## 2016-09-21 ENCOUNTER — Other Ambulatory Visit: Payer: Self-pay | Admitting: Nurse Practitioner

## 2016-09-22 ENCOUNTER — Other Ambulatory Visit: Payer: Medicare HMO

## 2016-09-22 DIAGNOSIS — E785 Hyperlipidemia, unspecified: Secondary | ICD-10-CM

## 2016-09-22 LAB — LIPID PANEL
CHOL/HDL RATIO: 4.1 (calc) (ref <5.0)
CHOLESTEROL: 310 mg/dL — AB (ref <200)
HDL: 75 mg/dL (ref >50)
LDL CHOLESTEROL (CALC): 218 mg/dL — AB
Non-HDL Cholesterol (Calc): 235 mg/dL (calc) — ABNORMAL HIGH (ref <130)
Triglycerides: 68 mg/dL (ref <150)

## 2016-09-24 ENCOUNTER — Ambulatory Visit (INDEPENDENT_AMBULATORY_CARE_PROVIDER_SITE_OTHER): Payer: Medicare HMO | Admitting: Internal Medicine

## 2016-09-24 ENCOUNTER — Encounter: Payer: Self-pay | Admitting: Internal Medicine

## 2016-09-24 VITALS — BP 110/70 | HR 74 | Temp 98.6°F | Resp 20 | Ht 65.0 in | Wt 135.8 lb

## 2016-09-24 DIAGNOSIS — M8580 Other specified disorders of bone density and structure, unspecified site: Secondary | ICD-10-CM | POA: Diagnosis not present

## 2016-09-24 DIAGNOSIS — E7801 Familial hypercholesterolemia: Secondary | ICD-10-CM | POA: Diagnosis not present

## 2016-09-24 DIAGNOSIS — I10 Essential (primary) hypertension: Secondary | ICD-10-CM | POA: Diagnosis not present

## 2016-09-24 DIAGNOSIS — Z79899 Other long term (current) drug therapy: Secondary | ICD-10-CM

## 2016-09-24 DIAGNOSIS — E78019 Familial hypercholesterolemia, unspecified: Secondary | ICD-10-CM

## 2016-09-24 DIAGNOSIS — E890 Postprocedural hypothyroidism: Secondary | ICD-10-CM | POA: Diagnosis not present

## 2016-09-24 DIAGNOSIS — M7502 Adhesive capsulitis of left shoulder: Secondary | ICD-10-CM | POA: Insufficient documentation

## 2016-09-24 DIAGNOSIS — M7501 Adhesive capsulitis of right shoulder: Secondary | ICD-10-CM | POA: Diagnosis not present

## 2016-09-24 DIAGNOSIS — Z23 Encounter for immunization: Secondary | ICD-10-CM

## 2016-09-24 NOTE — Patient Instructions (Signed)
START REPATHA injections every 2 weeks for cholesterol  Continue other medications as ordered  Follow up with specialists as scheduled  Get last office note from Endocrinology Dr Jeanann Lewandowsky  Follow up in 3 mos with Janett Billow for hyperlipidemia and HTN. Fasting labs prior to appt

## 2016-09-24 NOTE — Progress Notes (Signed)
Patient ID: Laurie Gray, female   DOB: July 21, 1955, 61 y.o.   MRN: [id number]    Location:  [facility] Place of Service: OFFICE  Chief Complaint  Patient presents with  . Medical Management of Chronic Issues    HPI:  61 yo female seen today for f/u. She is c/a insomnia since thyroid sx. She has pain in multiple joints - especially in right shoulder and right hip. Tramadol helps.   Hyperlipidemia - uncontrolled. LDL 218; Tchol 310. She is statin intolerant (myalgias). FHx significant for hyperlipidemia.  HIV - followed by ID Dr Johnnye Sima. Takes odefsey. CD4 count 430; HIV1 RNA quant <20 with log <1.30  Hypothyroidism s/p total thyroidectomy 2/2 papillary thyroid CA - takes levothyroxine. Last TSH 1.25 in 2016 in EPIC. She is followed by Endo Dr Jeanann Lewandowsky. She has insomnia since I131 tx for thyroid papillary CA  HTN - stable on benicar hct  Osteopenia - last DXA in June 2018  Right frozen shoulder - currently getting PT and ROM improving. She has distal neuropathy  Joint pain, multiple - MRI C spine in March showed cervical spondylosis and small C5-6 disc bulge but no stenosis.  Past Medical History:  Diagnosis Date  . Anemia   . Anxiety    hx of-not on any meds  . Arthritis    "shoulders, arms, fingers, neck, back" (01/31/2016)  . Chronic cervical pain   . History of bronchitis   . History of colon polyps   . History of kidney stones   . HIV infection ([place]) dx'd 2000   takes odefsey  . Hyperlipidemia    takes Pravastatin daily  . Hypertension    takes Benicar HCT daily  . Hyperthyroidism   . Hypothyroidism   . Internal hemorrhoids   . Joint pain   . Migraine    "in the past" (01/31/2016)  . Pituitary adenoma ([place])   . Refusal of blood transfusions as patient is Jehovah's Witness    "but substitutes are fine". (01/31/2016)  . Thyroid cancer ([place]) 12/2015  . Upper back pain     Past Surgical History:  Procedure Laterality Date  . ABDOMINAL HYSTERECTOMY    .  BUNIONECTOMY Right   . COLONOSCOPY    . CYSTOSCOPY    . HAMMER TOE SURGERY Left    "middle toe was broke; turned into hammertoe then repaired"  . HAMMER TOE SURGERY Bilateral    "both small toes"  . PARATHYROIDECTOMY Right 01/31/2016  . PARATHYROIDECTOMY Right 01/31/2016   Procedure: PARATHYROIDECTOMY ON RIGHT SIDE;  Surgeon: Izora Gala, MD;  Location: [facility];  Service: ENT;  Laterality: Right;  . SALPINGOOPHORECTOMY Bilateral   . THYROIDECTOMY N/A 01/31/2016   Procedure: TOTAL THYROIDECTOMY;  Surgeon: Izora Gala, MD;  Location: [facility];  Service: ENT;  Laterality: N/A;  . TOE SURGERY     bunion  . TONSILLECTOMY     as a child  . TOTAL THYROIDECTOMY  01/31/2016  . TUBAL LIGATION     30 yrs ago    Patient Care Team: Lauree Chandler, NP as PCP - General (Geriatric Medicine) Campbell Riches, MD as PCP - Infectious Diseases (Infectious Diseases) Jackolyn Confer, MD as Consulting Physician (General Surgery)  Social History   Social History  . Marital status: Widowed    Spouse name: N/A  . Number of children: N/A  . Years of education: N/A   Occupational History  . Not on file.   Social History Main Topics  . Smoking status:  Never Smoker  . Smokeless tobacco: Never Used  . Alcohol use 0.0 oz/week     Comment: 01/31/2016 "might go out to eat and have a drink once/month; no more than that"  . Drug use: No  . Sexual activity: Not Currently    Partners: Male     Comment: declined condoms   Other Topics Concern  . Not on file   Social History Narrative   Normal diet   Drinks Caffeine- coffee/coke/chocolate    Lives in an apartment/ third level/1 person/1 pet   Past profession- Museum/gallery exhibitions officer, cosmetologist   Exercises 3-4 x weekly (cardio and weights)         reports that she has never smoked. She has never used smokeless tobacco. She reports that she drinks alcohol. She reports that she does not use drugs.  Family History  Problem Relation Age of Onset  . Breast  cancer Mother   . Hypertension Mother   . Cancer Mother   . Hyperlipidemia Mother   . Stroke Mother   . Colon cancer Neg Hx    Family Status  Relation Status  . Mother Alive  . Father Deceased  . Sister Alive  . Son Alive  . Daughter Alive  . Neg Hx (Not Specified)     Allergies  Allergen Reactions  . Sulfonamide Derivatives Swelling    SWELLING REACTION UNSPECIFIED     Medications: Patient's Medications  New Prescriptions   No medications on file  Previous Medications   CYANOCOBALAMIN (B-12 PO)    Take 1 tablet by mouth daily.   HYDROCORTISONE (PROCTO-PAK) 1 % CREA    Twice daily as needed for hemorrhoids   LEVOTHYROXINE (SYNTHROID, LEVOTHROID) 88 MCG TABLET    Take 88 mcg by mouth daily before breakfast.   MENTHOL-CAMPHOR (ICY HOT ADVANCED RELIEF) 16-11 % CREA    Apply 1 application topically 4 (four) times daily as needed (for shoulder pain).   MULTIPLE VITAMINS-MINERALS (MULTIVITAMIN ADULTS 50+ PO)    Take 1 tablet by mouth daily.   NAPROXEN SODIUM (ANAPROX) 220 MG TABLET    Take 220 mg by mouth at bedtime.   ODEFSEY 200-25-25 MG TABS TABLET    TAKE 1 TABLET BY MOUTH EVERY DAY WITH BREAKFAST   OLMESARTAN-HYDROCHLOROTHIAZIDE (BENICAR HCT) 20-12.5 MG TABLET    TAKE 1 TABLET BY MOUTH DAILY.   OMEGA-3 ACID ETHYL ESTERS (LOVAZA) 1 G CAPSULE    TAKE 1 CAPSULE EVERY DAY   TRAMADOL (ULTRAM) 50 MG TABLET    TAKE 1 TABLET BY MOUTH THREE TIMES A DAY AS NEEDED FOR PAIN  Modified Medications   No medications on file  Discontinued Medications   FIBER ADULT GUMMIES PO    Take 2 tablets by mouth daily.   GLUCOSAMINE-CHONDROITIN (MOVE FREE PO)    Take 1 tablet by mouth daily.    Review of Systems  Musculoskeletal: Positive for arthralgias.  Psychiatric/Behavioral: Positive for sleep disturbance.  All other systems reviewed and are negative.   Vitals:   09/24/16 1343  BP: 110/70  Pulse: 74  Resp: 20  Temp: 98.6 F (37 C)  TempSrc: Oral  SpO2: 95%  Weight: 135 lb 12.8 oz  (61.6 kg)  Height: 5' 5" (1.651 m)   Body mass index is 22.6 kg/m.  Physical Exam  Constitutional: She is oriented to person, place, and time. She appears well-developed and well-nourished.  HENT:  Mouth/Throat: Oropharynx is clear and moist. No oropharyngeal exudate.  MMM; no oral thrush  Eyes: Pupils are equal,  round, and reactive to light. No scleral icterus.  Neck: Neck supple. Carotid bruit is not present. No tracheal deviation present. No thyromegaly (absent thyroid) present.  Cardiovascular: Normal rate, regular rhythm, normal heart sounds and intact distal pulses.  Exam reveals no gallop and no friction rub.   No murmur heard. No LE edema b/l. no calf TTP.   Pulmonary/Chest: Effort normal and breath sounds normal. No stridor. No respiratory distress. She has no wheezes. She has no rales.  Abdominal: Soft. Normal appearance and bowel sounds are normal. She exhibits no distension and no mass. There is no hepatomegaly. There is no tenderness. There is no rigidity, no rebound and no guarding. No hernia.  Musculoskeletal: She exhibits edema and tenderness (R>L shoulder anteriorly with markedly reduced ROM on right).  Right palmar tendon cysts, TTP; right SI joint restriction  Lymphadenopathy:    She has no cervical adenopathy.  Neurological: She is alert and oriented to person, place, and time.  Skin: Skin is warm and dry. No rash noted.  Psychiatric: She has a normal mood and affect. Her behavior is normal. Judgment and thought content normal.     Labs reviewed: Appointment on 09/22/2016  Component Date Value Ref Range Status  . Cholesterol 09/22/2016 310* <200 mg/dL Final  . HDL 09/22/2016 75  >50 mg/dL Final  . Triglycerides 09/22/2016 68  <150 mg/dL Final  . LDL Cholesterol (Calc) 09/22/2016 218* mg/dL (calc) Final   Comment: LDL-C levels > or = 190 mg/dL may indicate familial  hypercholesterolemia (FH). Clinical assessment and  measurement of blood lipid levels should be    considered for all first degree relatives of  patients with an FH diagnosis.  For questions about testing for familial hypercholesterolemia, please call Insurance risk surveyor at [email or web address]. Duncan Dull, et al. J National Lipid Association  Recommendations for Patient-Centered Management of  Dyslipidemia: Part 1 Journal of Clinical Lipidology  2015;9(2), 129-169. Reference range: <100 . Desirable range <100 mg/dL for primary prevention;   <70 mg/dL for patients with CHD or diabetic patients  with > or = 2 CHD risk factors. Marland Kitchen LDL-C is now calculated using the Martin-Hopkins  calculation, which is a validated novel method providing  better accuracy than the Friedewald equation in the  estimation of LDL-C.  Cresenciano Genre et al. Annamaria Helling. 7510;258(52): 2061-2068  (http://education.Quest[employer].com/faq/FAQ164)   . Total CHOL/HDL Ratio 09/22/2016 4.1  <5.0 (calc) Final  . Non-HDL Cholesterol (Calc) 09/22/2016 235* <130 mg/dL (calc) Final   Comment: Non-HDL level > or = 220 is very high and may indicate  genetic familial hypercholesterolemia ([place]). Clinical  assessment and measurement of blood lipid levels  should be considered for all first-degree relatives  of patients with an FH diagnosis. . For patients with diabetes plus 1 major ASCVD risk  factor, treating to a non-HDL-C goal of <100 mg/dL  (LDL-C of <70 mg/dL) is considered a therapeutic  option.   Admission on 08/23/2016, Discharged on 08/23/2016  Component Date Value Ref Range Status  . WBC 08/23/2016 5.5  4.0 - 10.5 K/uL Final  . RBC 08/23/2016 3.98  3.87 - 5.11 MIL/uL Final  . Hemoglobin 08/23/2016 12.4  12.0 - 15.0 g/dL Final  . HCT 08/23/2016 36.3  36.0 - 46.0 % Final  . MCV 08/23/2016 91.2  78.0 - 100.0 fL Final  . MCH 08/23/2016 31.2  26.0 - 34.0 pg Final  . MCHC 08/23/2016 34.2  30.0 - 36.0 g/dL Final  . RDW 08/23/2016 13.1  11.5 - 15.5 %  Final  . Platelets 08/23/2016 173  150 - 400 K/uL Final  . Sodium  08/23/2016 137  135 - 145 mmol/L Final  . Potassium 08/23/2016 3.3* 3.5 - 5.1 mmol/L Final  . Chloride 08/23/2016 99* 101 - 111 mmol/L Final  . CO2 08/23/2016 27  22 - 32 mmol/L Final  . Glucose, Bld 08/23/2016 139* 65 - 99 mg/dL Final  . BUN 08/23/2016 10  6 - 20 mg/dL Final  . Creatinine, Ser 08/23/2016 1.11* 0.44 - 1.00 mg/dL Final  . Calcium 08/23/2016 9.6  8.9 - 10.3 mg/dL Final  . GFR calc non Af Amer 08/23/2016 53* >60 mL/min Final  . GFR calc Af Amer 08/23/2016 >60  >60 mL/min Final   Comment: (NOTE) The eGFR has been calculated using the CKD EPI equation. This calculation has not been validated in all clinical situations. eGFR's persistently <60 mL/min signify possible Chronic Kidney Disease.   . Anion gap 08/23/2016 11  5 - 15 Final  . Color, Urine 08/23/2016 YELLOW  YELLOW Final  . APPearance 08/23/2016 CLEAR  CLEAR Final  . Specific Gravity, Urine 08/23/2016 1.005  1.005 - 1.030 Final  . pH 08/23/2016 5.0  5.0 - 8.0 Final  . Glucose, UA 08/23/2016 NEGATIVE  NEGATIVE mg/dL Final  . Hgb urine dipstick 08/23/2016 SMALL* NEGATIVE Final  . Bilirubin Urine 08/23/2016 NEGATIVE  NEGATIVE Final  . Ketones, ur 08/23/2016 5* NEGATIVE mg/dL Final  . Protein, ur 08/23/2016 NEGATIVE  NEGATIVE mg/dL Final  . Nitrite 08/23/2016 NEGATIVE  NEGATIVE Final  . Leukocytes, UA 08/23/2016 MODERATE* NEGATIVE Final  . RBC / HPF 08/23/2016 0-5  0 - 5 RBC/hpf Final  . WBC, UA 08/23/2016 6-30  0 - 5 WBC/hpf Final  . Bacteria, UA 08/23/2016 MANY* NONE SEEN Final  . Squamous Epithelial / LPF 08/23/2016 0-5* NONE SEEN Final  . Mucus 08/23/2016 PRESENT   Final    No results found.   Assessment/Plan   ICD-10-CM   1. Familial hypercholesterolemia E78.01 Lipid Panel  2. 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 Osteopenia, unspecified location M85.80   4. 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 Essential hypertension I10 CBC with  Differential/Platelets  6. High risk medication use Z79.899 CMP with eGFR  7. Need for immunization against influenza Z23 Flu Vaccine QUAD 36+ mos IM    Influenza vaccine given today  START REPATHA injections every 2 weeks for cholesterol  Continue other medications as ordered  Follow up with specialists as scheduled  Get last office note from Endocrinology Dr Jeanann Lewandowsky  Follow up in 3 mos with Laurie Gray for hyperlipidemia and HTN. Fasting labs prior to appt  [place] S. Perlie Gold  [facility] [address] [place], [place] [address] [phone] Cell (Monday-Friday 8 AM - 5 PM) [phone] After 5 PM and follow prompts

## 2016-09-24 NOTE — Progress Notes (Signed)
Location:  [facility]  Provider:   Code Status:  Goals of Care:  Advanced Directives 09/01/2016  Does Patient Have a Medical Advance Directive? No  Would patient like information on creating a medical advance directive? -     Chief Complaint  Patient presents with  . Medical Management of Chronic Issues    HPI: Patient is a 61 y.o. female seen today 4 month routine follow up.   UTI - Mrs. Richoux says she has completed her antibiotics and denies urinary symptoms.  Dental Pain - She states dental pain has resolved. She had a root canal and the pain was so bad she went to the ED at [facility] on 08/23/2016.  Hypertension - Stable on Benicar.  Hyperlipidemia -  Patient states she stopped taking pravastatin 6 months ago. She says, "It causes me muscle aches and pains." She says the muscle pain has not gone away but has gotten better. She stated taking omega 3 for her cholesterol. Mrs. Luisa Hart wants to check on what else she can take to reduce her cholesterol.  Hypothyroidism - She had thyroid cancer last year and has had her thyroid and parathyroid removed. She states she has fatigue all the time.   Past Medical History:  Diagnosis Date  . Anemia   . Anxiety    hx of-not on any meds  . Arthritis    "shoulders, arms, fingers, neck, back" (01/31/2016)  . Chronic cervical pain   . History of bronchitis   . History of colon polyps   . History of kidney stones   . HIV infection ([place]) dx'd 2000   takes odefsey  . Hyperlipidemia    takes Pravastatin daily  . Hypertension    takes Benicar HCT daily  . Hyperthyroidism   . Hypothyroidism   . Internal hemorrhoids   . Joint pain   . Migraine    "in the past" (01/31/2016)  . Pituitary adenoma ([place])   . Refusal of blood transfusions as patient is Jehovah's Witness    "but substitutes are fine". (01/31/2016)  . Thyroid cancer ([place]) 12/2015  . Upper back pain     Past Surgical History:  Procedure Laterality Date  . ABDOMINAL  HYSTERECTOMY    . BUNIONECTOMY Right   . COLONOSCOPY    . CYSTOSCOPY    . HAMMER TOE SURGERY Left    "middle toe was broke; turned into hammertoe then repaired"  . HAMMER TOE SURGERY Bilateral    "both small toes"  . PARATHYROIDECTOMY Right 01/31/2016  . PARATHYROIDECTOMY Right 01/31/2016   Procedure: PARATHYROIDECTOMY ON RIGHT SIDE;  Surgeon: Izora Gala, MD;  Location: [facility];  Service: ENT;  Laterality: Right;  . SALPINGOOPHORECTOMY Bilateral   . THYROIDECTOMY N/A 01/31/2016   Procedure: TOTAL THYROIDECTOMY;  Surgeon: Izora Gala, MD;  Location: [facility];  Service: ENT;  Laterality: N/A;  . TOE SURGERY     bunion  . TONSILLECTOMY     as a child  . TOTAL THYROIDECTOMY  01/31/2016  . TUBAL LIGATION     30 yrs ago    Allergies  Allergen Reactions  . Sulfonamide Derivatives Swelling    SWELLING REACTION UNSPECIFIED     Outpatient Encounter Prescriptions as of 09/24/2016  Medication Sig  . hydrocortisone (PROCTO-PAK) 1 % CREA Twice daily as needed for hemorrhoids  . levothyroxine (SYNTHROID, LEVOTHROID) 88 MCG tablet Take 88 mcg by mouth daily before breakfast.  . Menthol-Camphor (ICY HOT ADVANCED RELIEF) 16-11 % CREA Apply 1 application topically  4 (four) times daily as needed (for shoulder pain).  . Multiple Vitamins-Minerals (MULTIVITAMIN ADULTS 50+ PO) Take 1 tablet by mouth daily.  . naproxen sodium (ANAPROX) 220 MG tablet Take 220 mg by mouth at bedtime.  . ODEFSEY 200-25-25 MG TABS tablet TAKE 1 TABLET BY MOUTH EVERY DAY WITH BREAKFAST  . olmesartan-hydrochlorothiazide (BENICAR HCT) 20-12.5 MG tablet TAKE 1 TABLET BY MOUTH DAILY.  Marland Kitchen omega-3 acid ethyl esters (LOVAZA) 1 g capsule TAKE 1 CAPSULE EVERY DAY  . traMADol (ULTRAM) 50 MG tablet TAKE 1 TABLET BY MOUTH THREE TIMES A DAY AS NEEDED FOR PAIN  . Cyanocobalamin (B-12 PO) Take 1 tablet by mouth daily.  . [DISCONTINUED] FIBER ADULT GUMMIES PO Take 2 tablets by mouth daily.  . [DISCONTINUED] Glucosamine-Chondroitin (MOVE FREE  PO) Take 1 tablet by mouth daily.   No facility-administered encounter medications on file as of 09/24/2016.     Review of Systems:  Review of Systems  Constitutional: Negative for activity change, appetite change, chills, diaphoresis, fatigue, fever and unexpected weight change.  HENT: Negative for congestion, dental problem, drooling, ear discharge, ear pain, facial swelling, hearing loss, mouth sores, nosebleeds, postnasal drip, rhinorrhea, sinus pain, sinus pressure, sneezing, sore throat, tinnitus, trouble swallowing and voice change.   Eyes: Negative for photophobia, pain, discharge, redness, itching and visual disturbance.  Respiratory: Negative for apnea, cough, choking, chest tightness, shortness of breath, wheezing and stridor.   Cardiovascular: Negative for chest pain, palpitations and leg swelling.  Gastrointestinal: Negative for abdominal distention, abdominal pain, anal bleeding, blood in stool, constipation, diarrhea, nausea, rectal pain and vomiting.  Endocrine: Negative for cold intolerance, heat intolerance, polydipsia, polyphagia and polyuria.  Genitourinary: Negative for decreased urine volume, difficulty urinating, dyspareunia, dysuria, enuresis, flank pain, frequency, genital sores, hematuria, menstrual problem, pelvic pain, urgency, vaginal bleeding, vaginal discharge and vaginal pain.  Musculoskeletal: Positive for arthralgias (right hip), back pain and myalgias (Arms, shoulders and back.). Negative for gait problem, joint swelling, neck pain and neck stiffness.  Skin: Negative for color change, pallor, rash and wound.  Allergic/Immunologic: Negative for environmental allergies, food allergies and immunocompromised state.  Neurological: Negative for dizziness, tremors, seizures, syncope, facial asymmetry, speech difficulty, weakness, light-headedness, numbness and headaches.  Hematological: Negative for adenopathy. Does not bruise/bleed easily.  Psychiatric/Behavioral:  Negative for agitation, behavioral problems, confusion, decreased concentration, sleep disturbance and suicidal ideas.    Health Maintenance  Topic Date Due  . INFLUENZA VACCINE  08/06/2016  . TETANUS/TDAP  01/07/2020 (Originally 10/02/1974)  . MAMMOGRAM  08/14/2017  . PAP SMEAR  06/06/2019  . COLONOSCOPY  01/09/2025  . Hepatitis C Screening  Completed  . HIV Screening  Completed    Physical Exam: Vitals:   09/24/16 1343  BP: 110/70  Pulse: 74  Resp: 20  Temp: 98.6 F (37 C)  TempSrc: Oral  SpO2: 95%  Weight: 135 lb 12.8 oz (61.6 kg)  Height: 5\' 5"  (1.651 m)   Body mass index is 22.6 kg/m. Physical Exam  Constitutional: She appears well-developed and well-nourished. No distress.  HENT:  Head: Normocephalic and atraumatic.  Nose: Nose normal.  Mouth/Throat: Oropharynx is clear and moist. No oropharyngeal exudate.  Eyes: Pupils are equal, round, and reactive to light. Conjunctivae and EOM are normal. Left eye exhibits no discharge. No scleral icterus.  Neck: Normal range of motion. Neck supple. No JVD present. No tracheal deviation present.  Cardiovascular: Normal rate, regular rhythm, normal heart sounds and intact distal pulses.  Exam reveals no gallop and no friction rub.  No murmur heard. Pulmonary/Chest: Effort normal and breath sounds normal. No stridor. No respiratory distress. She has no wheezes. She has no rales. She exhibits no tenderness.  Abdominal: Soft. Bowel sounds are normal. She exhibits no distension and no mass. There is no tenderness. There is no rebound and no guarding.  Musculoskeletal: Normal range of motion. She exhibits tenderness (Palm right hand). She exhibits no edema or deformity.       Arms: Lymphadenopathy:    She has no cervical adenopathy.  Skin: Skin is warm and dry. No rash noted. She is not diaphoretic. No erythema. No pallor.  Psychiatric: She has a normal mood and affect. Her behavior is normal. Judgment and thought content normal.     Labs reviewed: Basic Metabolic Panel:  Recent Labs  05/20/16 1403 06/03/16 1603 08/23/16 1914  NA 139 142 137  K 3.5 3.8 3.3*  CL 101 106 99*  CO2 28 27 27   GLUCOSE 70 114* 139*  BUN 19 16 10   CREATININE 0.86 0.85 1.11*  CALCIUM 10.0 9.8 9.6   Liver Function Tests:  Recent Labs  01/24/16 1325 05/20/16 1403 06/03/16 1603  AST 20 17 18   ALT 15 15 15   ALKPHOS 48 58 56  BILITOT 0.3 0.4 0.3  PROT 6.6 7.3 6.5  ALBUMIN 4.1 4.4 4.1   No results for input(s): LIPASE, AMYLASE in the last 8760 hours. No results for input(s): AMMONIA in the last 8760 hours. CBC:  Recent Labs  11/19/15 1516  01/24/16 1325 06/03/16 1603 08/23/16 1914  WBC 3.7*  < > 4.7 3.1* 5.5  NEUTROABS 1,591  --   --   --   --   HGB 11.9  < > 12.0 11.4* 12.4  HCT 36.3  < > 36.5 35.7 36.3  MCV 92.4  < > 93.1 94.2 91.2  PLT 193  < > 187 251 173  < > = values in this interval not displayed. Lipid Panel:  Recent Labs  12/04/15 1623 09/22/16 0944  CHOL 214* 310*  HDL 72 75  LDLCALC 118*  --   TRIG 119 68  CHOLHDL 3.0 4.1   Lab Results  Component Value Date   HGBA1C 5.6 11/19/2015    Procedures since last visit: No results found.  Assessment/Plan  LABS: Return for labwork in 2 weeks.  Lipid CMP CBC      Labs/tests ordered:   Next appt:  Visit date not found   Garnett Nunziata C. Masiel Gentzler, Student AGACNP

## 2016-09-29 ENCOUNTER — Telehealth: Payer: Self-pay | Admitting: *Deleted

## 2016-09-29 NOTE — Telephone Encounter (Signed)
noted 

## 2016-09-29 NOTE — Telephone Encounter (Signed)
Patient stated that she had Crestor first and it made her muscles ache and was changed to pravastatin. Patient stated that she did better taking the Pravastatin and would like to continue with it instead of the Crestor. Please Advise.

## 2016-09-29 NOTE — Telephone Encounter (Signed)
Do not recommend pravastatin as it caused myalgias. She may have similar response to other statins but I am willing fir her to try crestor 10mg  #30 take 1 tab po qhs with 6RF

## 2016-09-29 NOTE — Telephone Encounter (Signed)
Patient called and stated that Dr. Eulas Post placed her on [place]. Patient stated that after reading up on the mediation she DOES NOT want to take this. Stated that she wants to go back on her Pravastatin 40mg . Please Advise.

## 2016-09-29 NOTE — Telephone Encounter (Signed)
Is it ok to fax in a Rx for Pravastatin 40mg . Please Advise.

## 2016-09-30 MED ORDER — PRAVASTATIN SODIUM 40 MG PO TABS: 40.0000 mg | ORAL_TABLET | Freq: Every day | ORAL | 1 refills | Status: DC

## 2016-09-30 NOTE — Telephone Encounter (Signed)
yes

## 2016-09-30 NOTE — Telephone Encounter (Signed)
Rx Faxed to pharmacy

## 2016-10-19 ENCOUNTER — Other Ambulatory Visit: Payer: Self-pay | Admitting: Nurse Practitioner

## 2016-10-20 NOTE — Telephone Encounter (Signed)
[place] Database verified and compliance confirmed   

## 2016-10-21 ENCOUNTER — Telehealth: Payer: Self-pay

## 2016-10-21 NOTE — Telephone Encounter (Signed)
I spoke with patient about mammogram order. She stated that she would rather have the mammogram done at [facility] since that is where she has always gone. Order in [place] has expired, please reorder.

## 2016-10-21 NOTE — Telephone Encounter (Signed)
Reason for exam was listed as lump in right breast. Dx: Breast mass

## 2016-10-22 ENCOUNTER — Other Ambulatory Visit: Payer: Self-pay | Admitting: Nurse Practitioner

## 2016-10-22 DIAGNOSIS — N6341 Unspecified lump in right breast, subareolar: Secondary | ICD-10-CM

## 2016-10-22 NOTE — Telephone Encounter (Signed)
done

## 2016-10-27 DIAGNOSIS — E213 Hyperparathyroidism, unspecified: Secondary | ICD-10-CM | POA: Diagnosis not present

## 2016-10-27 DIAGNOSIS — E039 Hypothyroidism, unspecified: Secondary | ICD-10-CM | POA: Diagnosis not present

## 2016-10-27 DIAGNOSIS — E612 Magnesium deficiency: Secondary | ICD-10-CM | POA: Diagnosis not present

## 2016-10-28 ENCOUNTER — Other Ambulatory Visit: Payer: Self-pay | Admitting: Nurse Practitioner

## 2016-10-28 DIAGNOSIS — N6320 Unspecified lump in the left breast, unspecified quadrant: Secondary | ICD-10-CM

## 2016-11-21 ENCOUNTER — Other Ambulatory Visit: Payer: Self-pay | Admitting: Nurse Practitioner

## 2016-11-21 NOTE — Telephone Encounter (Signed)
Rx called in #90/0RF

## 2016-11-22 ENCOUNTER — Other Ambulatory Visit: Payer: Self-pay | Admitting: Nurse Practitioner

## 2016-12-04 ENCOUNTER — Ambulatory Visit (INDEPENDENT_AMBULATORY_CARE_PROVIDER_SITE_OTHER): Payer: Medicare HMO | Admitting: Infectious Diseases

## 2016-12-04 ENCOUNTER — Encounter: Payer: Self-pay | Admitting: Infectious Diseases

## 2016-12-04 VITALS — BP 132/83 | HR 72 | Temp 98.6°F | Wt 133.0 lb

## 2016-12-04 DIAGNOSIS — B20 Human immunodeficiency virus [HIV] disease: Secondary | ICD-10-CM

## 2016-12-04 DIAGNOSIS — Z113 Encounter for screening for infections with a predominantly sexual mode of transmission: Secondary | ICD-10-CM | POA: Diagnosis not present

## 2016-12-04 DIAGNOSIS — M899 Disorder of bone, unspecified: Secondary | ICD-10-CM

## 2016-12-04 DIAGNOSIS — Z79899 Other long term (current) drug therapy: Secondary | ICD-10-CM

## 2016-12-04 DIAGNOSIS — K089 Disorder of teeth and supporting structures, unspecified: Secondary | ICD-10-CM | POA: Diagnosis not present

## 2016-12-04 DIAGNOSIS — L989 Disorder of the skin and subcutaneous tissue, unspecified: Secondary | ICD-10-CM

## 2016-12-04 DIAGNOSIS — C73 Malignant neoplasm of thyroid gland: Secondary | ICD-10-CM

## 2016-12-04 DIAGNOSIS — M949 Disorder of cartilage, unspecified: Secondary | ICD-10-CM

## 2016-12-04 DIAGNOSIS — E7801 Familial hypercholesterolemia: Secondary | ICD-10-CM | POA: Diagnosis not present

## 2016-12-04 LAB — COMPREHENSIVE METABOLIC PANEL
AG RATIO: 1.7 (calc) (ref 1.0–2.5)
ALBUMIN MSPROF: 4.5 g/dL (ref 3.6–5.1)
ALKALINE PHOSPHATASE (APISO): 58 U/L (ref 33–130)
ALT: 19 U/L (ref 6–29)
AST: 23 U/L (ref 10–35)
BILIRUBIN TOTAL: 0.4 mg/dL (ref 0.2–1.2)
BUN: 11 mg/dL (ref 7–25)
CHLORIDE: 101 mmol/L (ref 98–110)
CO2: 29 mmol/L (ref 20–32)
Calcium: 9.9 mg/dL (ref 8.6–10.4)
Creat: 0.91 mg/dL (ref 0.50–0.99)
GLOBULIN: 2.6 g/dL (ref 1.9–3.7)
Glucose, Bld: 105 mg/dL — ABNORMAL HIGH (ref 65–99)
POTASSIUM: 4.1 mmol/L (ref 3.5–5.3)
SODIUM: 138 mmol/L (ref 135–146)
TOTAL PROTEIN: 7.1 g/dL (ref 6.1–8.1)

## 2016-12-04 LAB — CBC
HEMATOCRIT: 37.8 % (ref 35.0–45.0)
HEMOGLOBIN: 12.6 g/dL (ref 11.7–15.5)
MCH: 30.4 pg (ref 27.0–33.0)
MCHC: 33.3 g/dL (ref 32.0–36.0)
MCV: 91.3 fL (ref 80.0–100.0)
MPV: 11.2 fL (ref 7.5–12.5)
Platelets: 230 10*3/uL (ref 140–400)
RBC: 4.14 10*6/uL (ref 3.80–5.10)
RDW: 12.8 % (ref 11.0–15.0)
WBC: 3.6 10*3/uL — AB (ref 3.8–10.8)

## 2016-12-04 NOTE — Assessment & Plan Note (Signed)
Lab Results  Component Value Date   CHOL 310 (H) 09/22/2016   HDL 75 09/22/2016   LDLCALC 118 (H) 12/04/2015   TRIG 68 09/22/2016   CHOLHDL 4.1 09/22/2016     Statin use may be causing her muscle pain? Will have her seen endo.

## 2016-12-04 NOTE — Assessment & Plan Note (Signed)
She will call maragaret as her caps are loose and food is getting stuck in them

## 2016-12-04 NOTE — Assessment & Plan Note (Signed)
Will get her in with a new endocrinologist.

## 2016-12-04 NOTE — Progress Notes (Signed)
   Subjective:    Patient ID: Laurie Gray, female    DOB: [birth year], 61 y.o.   MRN: [id number]  HPI [age]yo F with hx of dx 2000 HIV+, depression, hyperlipidemia, prev lap hysterecomy-BSO (2009). Previously took D4T cocktail, went to undetectable. Took herself off for 5 years. CD4 went under 200 and was restarted on atripla. Marland Kitchen  Has had f/u with GYN, nl PAP 05-2016. Has had previous abn on colpo (SIL).  Had colon 2013.  At her previous visit was changed to [facility] after she had been started on fosamax for low bone mineral density.  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  She also got radiocative Iodine. She had f/u with ENT 06-2016 for dry mouth.  Was seen by Endo 10-2016 and had her thyroid rx decreased due to TSH 0.165.  Today complains of swelling in her R hand. Having joint swelling and pain. Is unable to sleep due to the pain. Also has pain in her neck.  Also c/o R arm muscle soreness.  C/o excessive tiredness.    HIV 1 RNA Quant (copies/mL)  Date Value  06/03/2016 <20 NOT DETECTED  12/04/2015 <20  05/09/2015 <20   CD4 T Cell Abs (/uL)  Date Value  06/03/2016 430  12/04/2015 670  05/09/2015 560    Review of Systems  Constitutional: Positive for fatigue. Negative for appetite change, chills, fever and unexpected weight change.  Gastrointestinal: Negative for constipation and diarrhea.  Genitourinary: Negative for difficulty urinating.  Musculoskeletal: Positive for arthralgias and myalgias.  Psychiatric/Behavioral: Positive for dysphoric mood. The patient is nervous/anxious.   Please see HPI. All other systems reviewed and negative.      Objective:   Physical Exam  Constitutional: She appears well-developed and well-nourished.  HENT:  Mouth/Throat: No oropharyngeal exudate.  Eyes: EOM are normal. Pupils are equal, round, and reactive to light.  Neck: Neck supple.  Cardiovascular: Normal rate, regular rhythm and normal heart  sounds.  Pulmonary/Chest: Effort normal and breath sounds normal.  Abdominal: Soft. Bowel sounds are normal. There is no tenderness.  Musculoskeletal:       Arms: Lymphadenopathy:    She has no cervical adenopathy.  Psychiatric: Her mood appears anxious.      Assessment & Plan:

## 2016-12-04 NOTE — Assessment & Plan Note (Addendum)
No clear if she has fibromyaligia? RA? Thyroid releated issues? Will have her seen by rheum

## 2016-12-04 NOTE — Assessment & Plan Note (Signed)
Will check her labs.  Will ask pharm if we could get her onto juluca (to get away from tenofovir).  [facility] 6 months.

## 2016-12-04 NOTE — Assessment & Plan Note (Signed)
Will have her seen by derm for TBSE

## 2016-12-05 LAB — T-HELPER CELL (CD4) - (RCID CLINIC ONLY)
CD4 % Helper T Cell: 39 % (ref 33–55)
CD4 T CELL ABS: 630 /uL (ref 400–2700)

## 2016-12-08 LAB — HIV-1 RNA QUANT-NO REFLEX-BLD
HIV 1 RNA Quant: <20 copies/mL
HIV-1 RNA Quant, Log: <1.3 Log copies/mL

## 2016-12-18 ENCOUNTER — Other Ambulatory Visit: Payer: Self-pay | Admitting: Nurse Practitioner

## 2016-12-19 ENCOUNTER — Other Ambulatory Visit: Payer: Self-pay | Admitting: *Deleted

## 2016-12-19 MED ORDER — TRAMADOL HCL 50 MG PO TABS: ORAL_TABLET | ORAL | 0 refills | Status: DC

## 2016-12-19 NOTE — Telephone Encounter (Signed)
Patient requested. Phoned to pharmacy and spoke with Estill Bamberg.

## 2016-12-22 ENCOUNTER — Other Ambulatory Visit: Payer: Medicare HMO

## 2016-12-22 DIAGNOSIS — E7801 Familial hypercholesterolemia: Secondary | ICD-10-CM

## 2016-12-22 DIAGNOSIS — E890 Postprocedural hypothyroidism: Secondary | ICD-10-CM | POA: Diagnosis not present

## 2016-12-22 DIAGNOSIS — Z79899 Other long term (current) drug therapy: Secondary | ICD-10-CM

## 2016-12-22 DIAGNOSIS — Z113 Encounter for screening for infections with a predominantly sexual mode of transmission: Secondary | ICD-10-CM

## 2016-12-22 DIAGNOSIS — I10 Essential (primary) hypertension: Secondary | ICD-10-CM

## 2016-12-22 LAB — CBC WITH DIFFERENTIAL/PLATELET
BASOS ABS: 19 {cells}/uL (ref 0–200)
BASOS PCT: 0.6 %
EOS PCT: 1.9 %
Eosinophils Absolute: 61 cells/uL (ref 15–500)
HCT: 34.9 % — ABNORMAL LOW (ref 35.0–45.0)
HEMOGLOBIN: 11.6 g/dL — AB (ref 11.7–15.5)
LYMPHS ABS: 1594 {cells}/uL (ref 850–3900)
MCH: 30.4 pg (ref 27.0–33.0)
MCHC: 33.2 g/dL (ref 32.0–36.0)
MCV: 91.6 fL (ref 80.0–100.0)
MONOS PCT: 8.2 %
MPV: 11.1 fL (ref 7.5–12.5)
Neutro Abs: 1264 cells/uL — ABNORMAL LOW (ref 1500–7800)
Neutrophils Relative %: 39.5 %
PLATELETS: 186 10*3/uL (ref 140–400)
RBC: 3.81 10*6/uL (ref 3.80–5.10)
RDW: 12.9 % (ref 11.0–15.0)
TOTAL LYMPHOCYTE: 49.8 %
WBC: 3.2 10*3/uL — ABNORMAL LOW (ref 3.8–10.8)
WBCMIX: 262 {cells}/uL (ref 200–950)

## 2016-12-22 LAB — LIPID PANEL
CHOL/HDL RATIO: 3.2 (calc) (ref <5.0)
CHOLESTEROL: 207 mg/dL — AB (ref <200)
HDL: 64 mg/dL (ref >50)
LDL CHOLESTEROL (CALC): 127 mg/dL — AB
Non-HDL Cholesterol (Calc): 143 mg/dL (calc) — ABNORMAL HIGH (ref <130)
TRIGLYCERIDES: 72 mg/dL (ref <150)

## 2016-12-22 LAB — COMPLETE METABOLIC PANEL WITH GFR
AG Ratio: 1.8 (calc) (ref 1.0–2.5)
ALKALINE PHOSPHATASE (APISO): 48 U/L (ref 33–130)
ALT: 14 U/L (ref 6–29)
AST: 17 U/L (ref 10–35)
Albumin: 4.1 g/dL (ref 3.6–5.1)
BILIRUBIN TOTAL: 0.4 mg/dL (ref 0.2–1.2)
BUN/Creatinine Ratio: 13 (calc) (ref 6–22)
BUN: 14 mg/dL (ref 7–25)
CHLORIDE: 105 mmol/L (ref 98–110)
CO2: 34 mmol/L — AB (ref 20–32)
CREATININE: 1.05 mg/dL — AB (ref 0.50–0.99)
Calcium: 9.6 mg/dL (ref 8.6–10.4)
GFR, Est African American: 66 mL/min/{1.73_m2} (ref >=60)
GFR, Est Non African American: 57 mL/min/{1.73_m2} — ABNORMAL LOW (ref >=60)
GLUCOSE: 118 mg/dL — AB (ref 65–99)
Globulin: 2.3 g/dL (calc) (ref 1.9–3.7)
Potassium: 4 mmol/L (ref 3.5–5.3)
Sodium: 143 mmol/L (ref 135–146)
TOTAL PROTEIN: 6.4 g/dL (ref 6.1–8.1)

## 2016-12-23 ENCOUNTER — Encounter: Payer: Self-pay | Admitting: Infectious Diseases

## 2016-12-24 ENCOUNTER — Encounter: Payer: Self-pay | Admitting: Nurse Practitioner

## 2016-12-24 ENCOUNTER — Ambulatory Visit (INDEPENDENT_AMBULATORY_CARE_PROVIDER_SITE_OTHER): Payer: Medicare HMO | Admitting: Nurse Practitioner

## 2016-12-24 VITALS — BP 136/82 | HR 63 | Temp 98.4°F | Resp 17 | Ht 65.0 in | Wt 135.0 lb

## 2016-12-24 DIAGNOSIS — E890 Postprocedural hypothyroidism: Secondary | ICD-10-CM | POA: Diagnosis not present

## 2016-12-24 DIAGNOSIS — I10 Essential (primary) hypertension: Secondary | ICD-10-CM

## 2016-12-24 DIAGNOSIS — E7801 Familial hypercholesterolemia: Secondary | ICD-10-CM

## 2016-12-24 DIAGNOSIS — E78019 Familial hypercholesterolemia, unspecified: Secondary | ICD-10-CM

## 2016-12-24 DIAGNOSIS — D649 Anemia, unspecified: Secondary | ICD-10-CM | POA: Diagnosis not present

## 2016-12-24 DIAGNOSIS — Z1231 Encounter for screening mammogram for malignant neoplasm of breast: Secondary | ICD-10-CM

## 2016-12-24 DIAGNOSIS — Z1239 Encounter for other screening for malignant neoplasm of breast: Secondary | ICD-10-CM

## 2016-12-24 NOTE — Progress Notes (Signed)
Careteam: Patient Care Team: Lauree Chandler, NP as PCP - General (Geriatric Medicine) Campbell Riches, MD as PCP - Infectious Diseases (Infectious Diseases) Jackolyn Confer, MD as Consulting Physician (General Surgery)  Advanced Directive information Does Patient Have a Medical Advance Directive?: No  Allergies  Allergen Reactions  . Crestor [Rosuvastatin Calcium]     myalgias  . Pravastatin     myalgias  . Sulfonamide Derivatives Swelling    SWELLING REACTION UNSPECIFIED     Chief Complaint  Patient presents with  . Medical Management of Chronic Issues    Pt is being seen for a 3 month routine visit. Pt states she has ongoing severe joint and muscle pain; Pt unable to sleep due to pain with causes extreme tiredness.   Marland Kitchen Health Maintenance    would like mammogram referral     HPI: Patient is a 61 y.o. female seen in the office today for routine follow up.  Pt with hx of hyperlipidemia.  Pt with pain in hips, shoulder, hand Joints are swollen and achy in the morning.  Dr Johnnye Sima has made a rheumatologist referral Also her endocrinologist is retiring and Dr Johnnye Sima made endocrinologist referral as well. She is also planning to do to dermatology due to skin lesions she is concerned over.   Does not sleep well at night due to her pain. Reports she is very tired due to not being able to sleep at night.  Mostly takes tramadol for pain.  Does not use aleve often- maybe twice a week.  Pt with frozen shoulder- limited ROM and pain at night but has improved.   Anemia- hgb down from 12.6 to 11.6 in 2 weeks.  Denies dark tarry stools. Has hemorrhoids that bothers her occasionally.  Denies shortness of breath, no chest pains pain. No dizziness or new headaches.  Review of Systems:  Review of Systems  Constitutional: Positive for malaise/fatigue. Negative for chills and fever.  Respiratory: Negative for cough, sputum production and shortness of breath.     Cardiovascular: Negative for chest pain, palpitations and leg swelling.  Gastrointestinal: Negative for abdominal pain, constipation, diarrhea and heartburn.  Genitourinary: Negative for dysuria, frequency and urgency.  Musculoskeletal: Positive for joint pain and myalgias. Negative for back pain and falls.  Skin: Negative.   Neurological: Positive for weakness. Negative for dizziness, tingling and headaches.  Psychiatric/Behavioral: Negative for depression and memory loss. The patient has insomnia.     Past Medical History:  Diagnosis Date  . Anemia   . Anxiety    hx of-not on any meds  . Arthritis    "shoulders, arms, fingers, neck, back" (01/31/2016)  . Chronic cervical pain   . History of bronchitis   . History of colon polyps   . History of kidney stones   . HIV infection ([place]) dx'd 2000   takes odefsey  . Hyperlipidemia    takes Pravastatin daily  . Hypertension    takes Benicar HCT daily  . Hyperthyroidism   . Hypothyroidism   . Internal hemorrhoids   . Joint pain   . Migraine    "in the past" (01/31/2016)  . Pituitary adenoma ([place])   . Refusal of blood transfusions as patient is Jehovah's Witness    "but substitutes are fine". (01/31/2016)  . Thyroid cancer ([place]) 12/2015  . Upper back pain    Past Surgical History:  Procedure Laterality Date  . ABDOMINAL HYSTERECTOMY    . BUNIONECTOMY Right   . COLONOSCOPY    .  CYSTOSCOPY    . HAMMER TOE SURGERY Left    "middle toe was broke; turned into hammertoe then repaired"  . HAMMER TOE SURGERY Bilateral    "both small toes"  . PARATHYROIDECTOMY Right 01/31/2016  . PARATHYROIDECTOMY Right 01/31/2016   Procedure: PARATHYROIDECTOMY ON RIGHT SIDE;  Surgeon: Izora Gala, MD;  Location: [facility];  Service: ENT;  Laterality: Right;  . SALPINGOOPHORECTOMY Bilateral   . THYROIDECTOMY N/A 01/31/2016   Procedure: TOTAL THYROIDECTOMY;  Surgeon: Izora Gala, MD;  Location: [facility];  Service: ENT;  Laterality: N/A;  . TOE SURGERY      bunion  . TONSILLECTOMY     as a child  . TOTAL THYROIDECTOMY  01/31/2016  . TUBAL LIGATION     30 yrs ago   Social History:   reports that  has never smoked. she has never used smokeless tobacco. She reports that she drinks alcohol. She reports that she does not use drugs.  Family History  Problem Relation Age of Onset  . Breast cancer Mother   . Hypertension Mother   . Cancer Mother   . Hyperlipidemia Mother   . Stroke Mother   . Osteoarthritis Maternal Grandmother   . Breast cancer Maternal Grandmother   . Colon cancer Neg Hx     Medications:   Medication List        Accurate as of 12/24/16  2:34 PM. Always use your most recent med list.          B-12 PO   glucosamine-chondroitin 500-400 MG tablet   hydrocortisone 1 % Crea Commonly known as:  PROCTO-PAK Twice daily as needed for hemorrhoids   ICY HOT ADVANCED RELIEF 16-11 % Crea Generic drug:  Menthol-Camphor   levothyroxine 88 MCG tablet Commonly known as:  SYNTHROID, LEVOTHROID   MULTIVITAMIN ADULTS 50+ PO   naproxen sodium 220 MG tablet Commonly known as:  ALEVE   ODEFSEY 200-25-25 MG Tabs tablet Generic drug:  emtricitabine-rilpivir-tenofovir AF TAKE 1 TABLET BY MOUTH EVERY DAY WITH BREAKFAST   olmesartan-hydrochlorothiazide 20-12.5 MG tablet Commonly known as:  BENICAR HCT TAKE 1 TABLET BY MOUTH DAILY.   omega-3 acid ethyl esters 1 g capsule Commonly known as:  LOVAZA TAKE 1 CAPSULE EVERY DAY   pravastatin 40 MG tablet Commonly known as:  PRAVACHOL Take 1 tablet (40 mg total) by mouth daily.   traMADol 50 MG tablet Commonly known as:  ULTRAM TAKE 1 TABLET BY MOUTH THREE TIMES A DAY AS NEEDED FOR PAIN        Physical Exam:  Vitals:   12/24/16 1428  BP: 136/82  Pulse: 63  Resp: 17  Temp: 98.4 F (36.9 C)  TempSrc: Oral  SpO2: 96%  Weight: 135 lb (61.2 kg)  Height: 5\' 5"  (1.651 m)   Body mass index is 22.47 kg/m.  Physical Exam  Constitutional: She is oriented to  person, place, and time. She appears well-developed and well-nourished.  HENT:  Mouth/Throat: Oropharynx is clear and moist. No oropharyngeal exudate.  MMM; no oral thrush  Eyes: Pupils are equal, round, and reactive to light. No scleral icterus.  Neck: Neck supple. Carotid bruit is not present. No tracheal deviation present. No thyromegaly (absent thyroid) present.  Cardiovascular: Normal rate, regular rhythm and normal heart sounds.  No LE edema b/l. no calf TTP.   Pulmonary/Chest: Effort normal and breath sounds normal. No stridor. No respiratory distress. She has no wheezes. She has no rales.  Abdominal: Soft. Normal appearance and bowel sounds are normal.  She exhibits no distension and no mass. There is no hepatomegaly. There is no tenderness. There is no rigidity and no guarding.  Genitourinary:  Genitourinary Comments: Declines rectal exam- reports she will do stool card  Musculoskeletal: She exhibits tenderness (R>L shoulder anteriorly with markedly reduced ROM on right). She exhibits no edema.  Right palmar tendon cysts, TTP; right SI joint restriction  Lymphadenopathy:    She has no cervical adenopathy.  Neurological: She is alert and oriented to person, place, and time.  Skin: Skin is warm and dry. No rash noted.  Psychiatric: She has a normal mood and affect. Her behavior is normal. Judgment and thought content normal.    Labs reviewed: Basic Metabolic Panel: Recent Labs    08/23/16 1914 12/04/16 1527 12/22/16 0842  NA 137 138 143  K 3.3* 4.1 4.0  CL 99* 101 105  CO2 27 29 34*  GLUCOSE 139* 105* 118*  BUN 10 11 14   CREATININE 1.11* 0.91 1.05*  CALCIUM 9.6 9.9 9.6   Liver Function Tests: Recent Labs    01/24/16 1325 05/20/16 1403 06/03/16 1603 12/04/16 1527 12/22/16 0842  AST 20 17 18 23 17   ALT 15 15 15 19 14   ALKPHOS 48 58 56  --   --   BILITOT 0.3 0.4 0.3 0.4 0.4  PROT 6.6 7.3 6.5 7.1 6.4  ALBUMIN 4.1 4.4 4.1  --   --    No results for input(s):  LIPASE, AMYLASE in the last 8760 hours. No results for input(s): AMMONIA in the last 8760 hours. CBC: Recent Labs    08/23/16 1914 12/04/16 1527 12/22/16 0842  WBC 5.5 3.6* 3.2*  NEUTROABS  --   --  1,264*  HGB 12.4 12.6 11.6*  HCT 36.3 37.8 34.9*  MCV 91.2 91.3 91.6  PLT 173 230 186   Lipid Panel: Recent Labs    09/22/16 0944 12/22/16 0842  CHOL 310* 207*  HDL 75 64  TRIG 68 72  CHOLHDL 4.1 3.2   TSH: No results for input(s): TSH in the last 8760 hours. A1C: Lab Results  Component Value Date   HGBA1C 5.6 11/19/2015     Assessment/Plan 1. Familial hypercholesterolemia LDL improved from previous, conts on pravastatin 40 mg daily with dietary changes   2. Postoperative hypothyroidism Following with endocrine for TSH, conts on thyroid supplements.   3. Essential hypertension Blood pressure stable, cont current regimen.   4. Screening for breast cancer - MM Digital Screening; Future  5. Anemia, unspecified type -hgb down since last check, asymptomatic at this time. Will follow up with occult blood test.  - CBC with Differential/Platelets; Future - Fecal Globin By Immunochemistry; Future  Next appt: 4 months for routine follow up [place]. Harle Battiest  [facility] [phone] 8 am - 5 pm) [phone] (after hours)

## 2016-12-24 NOTE — Patient Instructions (Addendum)
To bring back stool kit and get repeat blood work in 1 week To make lab appt in 1 week  Cont to work on diet and medication for cholesterol Will wait for rheumatology referral before stating additional medication for pain.

## 2016-12-31 ENCOUNTER — Telehealth: Payer: Self-pay

## 2016-12-31 ENCOUNTER — Other Ambulatory Visit: Payer: Medicare HMO

## 2016-12-31 ENCOUNTER — Other Ambulatory Visit: Payer: Self-pay

## 2016-12-31 DIAGNOSIS — D649 Anemia, unspecified: Secondary | ICD-10-CM

## 2016-12-31 LAB — CBC WITH DIFFERENTIAL/PLATELET
BASOS PCT: 1 %
Basophils Absolute: 30 cells/uL (ref 0–200)
EOS ABS: 69 {cells}/uL (ref 15–500)
Eosinophils Relative: 2.3 %
HEMATOCRIT: 33.7 % — AB (ref 35.0–45.0)
HEMOGLOBIN: 11.2 g/dL — AB (ref 11.7–15.5)
LYMPHS ABS: 1425 {cells}/uL (ref 850–3900)
MCH: 30.5 pg (ref 27.0–33.0)
MCHC: 33.2 g/dL (ref 32.0–36.0)
MCV: 91.8 fL (ref 80.0–100.0)
MPV: 11.5 fL (ref 7.5–12.5)
Monocytes Relative: 8 %
NEUTROS ABS: 1236 {cells}/uL — AB (ref 1500–7800)
Neutrophils Relative %: 41.2 %
PLATELETS: 190 10*3/uL (ref 140–400)
RBC: 3.67 10*6/uL — AB (ref 3.80–5.10)
RDW: 13 % (ref 11.0–15.0)
TOTAL LYMPHOCYTE: 47.5 %
WBC: 3 10*3/uL — AB (ref 3.8–10.8)
WBCMIX: 240 {cells}/uL (ref 200–950)

## 2016-12-31 NOTE — Telephone Encounter (Signed)
Patient stopped by the office to drop off an IFOB kit. Patient did not list the time of specimen collection so I left a message asking patient to call the office.

## 2017-01-01 ENCOUNTER — Other Ambulatory Visit: Payer: Self-pay | Admitting: Nurse Practitioner

## 2017-01-01 ENCOUNTER — Encounter: Payer: Self-pay | Admitting: Infectious Diseases

## 2017-01-01 ENCOUNTER — Telehealth: Payer: Self-pay | Admitting: *Deleted

## 2017-01-01 ENCOUNTER — Other Ambulatory Visit: Payer: Self-pay

## 2017-01-01 DIAGNOSIS — D649 Anemia, unspecified: Secondary | ICD-10-CM

## 2017-01-01 LAB — FECAL GLOBIN BY IMMUNOCHEMISTRY
FECAL GLOBIN RESULT: NOT DETECTED
MICRO NUMBER:: 81450091
SPECIMEN QUALITY:: ADEQUATE

## 2017-01-01 NOTE — Telephone Encounter (Signed)
Patientt is calling for an update on the status of her rheumatology referral. Please contact her at [phone]. Landis Gandy, RN

## 2017-01-07 ENCOUNTER — Telehealth: Payer: Self-pay | Admitting: Nurse Practitioner

## 2017-01-07 NOTE — Telephone Encounter (Signed)
01/07/17 Left msg asking pt to confirm this AWV-S appt w/ nurse. Last AWV 01/21/16. VDM (DD)

## 2017-01-08 ENCOUNTER — Other Ambulatory Visit: Payer: Medicare HMO

## 2017-01-08 DIAGNOSIS — D649 Anemia, unspecified: Secondary | ICD-10-CM

## 2017-01-08 LAB — CBC WITH DIFFERENTIAL/PLATELET
BASOS ABS: 32 {cells}/uL (ref 0–200)
Basophils Relative: 0.7 %
EOS ABS: 32 {cells}/uL (ref 15–500)
Eosinophils Relative: 0.7 %
HCT: 37.1 % (ref 35.0–45.0)
Hemoglobin: 12.4 g/dL (ref 11.7–15.5)
Lymphs Abs: 1872 cells/uL (ref 850–3900)
MCH: 30.7 pg (ref 27.0–33.0)
MCHC: 33.4 g/dL (ref 32.0–36.0)
MCV: 91.8 fL (ref 80.0–100.0)
MPV: 11.4 fL (ref 7.5–12.5)
Monocytes Relative: 7.6 %
NEUTROS PCT: 49.4 %
Neutro Abs: 2223 cells/uL (ref 1500–7800)
PLATELETS: 214 10*3/uL (ref 140–400)
RBC: 4.04 10*6/uL (ref 3.80–5.10)
RDW: 13 % (ref 11.0–15.0)
TOTAL LYMPHOCYTE: 41.6 %
WBC: 4.5 10*3/uL (ref 3.8–10.8)
WBCMIX: 342 {cells}/uL (ref 200–950)

## 2017-01-09 ENCOUNTER — Other Ambulatory Visit: Payer: Medicare HMO

## 2017-01-09 ENCOUNTER — Encounter: Payer: Self-pay | Admitting: Infectious Diseases

## 2017-01-09 DIAGNOSIS — D649 Anemia, unspecified: Secondary | ICD-10-CM | POA: Diagnosis not present

## 2017-01-10 LAB — FECAL GLOBIN BY IMMUNOCHEMISTRY
FECAL GLOBIN RESULT: NOT DETECTED
MICRO NUMBER:: 90016126
SPECIMEN QUALITY: ADEQUATE

## 2017-01-19 ENCOUNTER — Other Ambulatory Visit: Payer: Self-pay | Admitting: Nurse Practitioner

## 2017-01-19 NOTE — Telephone Encounter (Signed)
[place] Database verified and compliance confirmed   

## 2017-01-21 ENCOUNTER — Ambulatory Visit (INDEPENDENT_AMBULATORY_CARE_PROVIDER_SITE_OTHER): Payer: Medicare HMO

## 2017-01-21 DIAGNOSIS — M19032 Primary osteoarthritis, left wrist: Secondary | ICD-10-CM

## 2017-01-21 DIAGNOSIS — Z0000 Encounter for general adult medical examination without abnormal findings: Secondary | ICD-10-CM

## 2017-01-21 MED ORDER — ZOSTER VAC RECOMB ADJUVANTED 50 MCG/0.5ML IM SUSR: 0.5000 mL | Freq: Once | INTRAMUSCULAR | 1 refills | Status: AC

## 2017-01-21 MED ORDER — TETANUS-DIPHTH-ACELL PERTUSSIS 5-2.5-18.5 LF-MCG/0.5 IM SUSP: 0.5000 mL | Freq: Once | INTRAMUSCULAR | 0 refills | Status: AC

## 2017-01-21 NOTE — Patient Instructions (Signed)
Laurie Gray , Thank you for taking time to come for your Medicare Wellness Visit. I appreciate your ongoing commitment to your health goals. Please review the following plan we discussed and let me know if I can assist you in the future.   Screening recommendations/referrals: Colonoscopy up to date. Due 01/09/2025 Mammogram up to date. Due 08/14/2017 Bone Density up to date Recommended yearly ophthalmology/optometry visit for glaucoma screening and checkup Recommended yearly dental visit for hygiene and checkup  Vaccinations: Influenza vaccine up to date. Due 2019 fall season Pneumococcal vaccine up to date Tdap vaccine due, prescription given to you Shingles vaccine due, prescription sent to pharmacy    Advanced directives: Advance directive discussed with you today. I have provided a copy for you to complete at home and have notarized. Once this is complete please bring a copy in to our office so we can scan it into your chart.  Conditions/risks identified: Referral to rheumatologist  Next appointment: Tyson Dense, RN  Preventive Care 40-64 Years, Female Preventive care refers to lifestyle choices and visits with your health care provider that can promote health and wellness. What does preventive care include?  A yearly physical exam. This is also called an annual well check.  Dental exams once or twice a year.  Routine eye exams. Ask your health care provider how often you should have your eyes checked.  Personal lifestyle choices, including:  Daily care of your teeth and gums.  Regular physical activity.  Eating a healthy diet.  Avoiding tobacco and drug use.  Limiting alcohol use.  Practicing safe sex.  Taking low-dose aspirin daily starting at age 91.  Taking vitamin and mineral supplements as recommended by your health care provider. What happens during an annual well check? The services and screenings done by your health care provider during your annual well  check will depend on your age, overall health, lifestyle risk factors, and family history of disease. Counseling  Your health care provider may ask you questions about your:  Alcohol use.  Tobacco use.  Drug use.  Emotional well-being.  Home and relationship well-being.  Sexual activity.  Eating habits.  Work and work Statistician.  Method of birth control.  Menstrual cycle.  Pregnancy history. Screening  You may have the following tests or measurements:  Height, weight, and BMI.  Blood pressure.  Lipid and cholesterol levels. These may be checked every 5 years, or more frequently if you are over 21 years old.  Skin check.  Lung cancer screening. You may have this screening every year starting at age 21 if you have a 30-pack-year history of smoking and currently smoke or have quit within the past 15 years.  Fecal occult blood test (FOBT) of the stool. You may have this test every year starting at age 30.  Flexible sigmoidoscopy or colonoscopy. You may have a sigmoidoscopy every 5 years or a colonoscopy every 10 years starting at age 30.  Hepatitis C blood test.  Hepatitis B blood test.  Sexually transmitted disease (STD) testing.  Diabetes screening. This is done by checking your blood sugar (glucose) after you have not eaten for a while (fasting). You may have this done every 1-3 years.  Mammogram. This may be done every 1-2 years. Talk to your health care provider about when you should start having regular mammograms. This may depend on whether you have a family history of breast cancer.  BRCA-related cancer screening. This may be done if you have a family history of breast,  ovarian, tubal, or peritoneal cancers.  Pelvic exam and Pap test. This may be done every 3 years starting at age 66. Starting at age 27, this may be done every 5 years if you have a Pap test in combination with an HPV test.  Bone density scan. This is done to screen for osteoporosis. You  may have this scan if you are at high risk for osteoporosis. Discuss your test results, treatment options, and if necessary, the need for more tests with your health care provider. Vaccines  Your health care provider may recommend certain vaccines, such as:  Influenza vaccine. This is recommended every year.  Tetanus, diphtheria, and acellular pertussis (Tdap, Td) vaccine. You may need a Td booster every 10 years.  Zoster vaccine. You may need this after age 60.  Pneumococcal 13-valent conjugate (PCV13) vaccine. You may need this if you have certain conditions and were not previously vaccinated.  Pneumococcal polysaccharide (PPSV23) vaccine. You may need one or two doses if you smoke cigarettes or if you have certain conditions. Talk to your health care provider about which screenings and vaccines you need and how often you need them. This information is not intended to replace advice given to you by your health care provider. Make sure you discuss any questions you have with your health care provider. Document Released: 01/19/2015 Document Revised: 09/12/2015 Document Reviewed: 10/24/2014 Elsevier Interactive Patient Education  2017 [employer] Prevention in the Home Falls can cause injuries. They can happen to people of all ages. There are many things you can do to make your home safe and to help prevent falls. What can I do on the outside of my home?  Regularly fix the edges of walkways and driveways and fix any cracks.  Remove anything that might make you trip as you walk through a door, such as a raised step or threshold.  Trim any bushes or trees on the path to your home.  Use bright outdoor lighting.  Clear any walking paths of anything that might make someone trip, such as rocks or tools.  Regularly check to see if handrails are loose or broken. Make sure that both sides of any steps have handrails.  Any raised decks and porches should have guardrails on the  edges.  Have any leaves, snow, or ice cleared regularly.  Use sand or salt on walking paths during winter.  Clean up any spills in your garage right away. This includes oil or grease spills. What can I do in the bathroom?  Use night lights.  Install grab bars by the toilet and in the tub and shower. Do not use towel bars as grab bars.  Use non-skid mats or decals in the tub or shower.  If you need to sit down in the shower, use a plastic, non-slip stool.  Keep the floor dry. Clean up any water that spills on the floor as soon as it happens.  Remove soap buildup in the tub or shower regularly.  Attach bath mats securely with double-sided non-slip rug tape.  Do not have throw rugs and other things on the floor that can make you trip. What can I do in the bedroom?  Use night lights.  Make sure that you have a light by your bed that is easy to reach.  Do not use any sheets or blankets that are too big for your bed. They should not hang down onto the floor.  Have a firm chair that has side  arms. You can use this for support while you get dressed.  Do not have throw rugs and other things on the floor that can make you trip. What can I do in the kitchen?  Clean up any spills right away.  Avoid walking on wet floors.  Keep items that you use a lot in easy-to-reach places.  If you need to reach something above you, use a strong step stool that has a grab bar.  Keep electrical cords out of the way.  Do not use floor polish or wax that makes floors slippery. If you must use wax, use non-skid floor wax.  Do not have throw rugs and other things on the floor that can make you trip. What can I do with my stairs?  Do not leave any items on the stairs.  Make sure that there are handrails on both sides of the stairs and use them. Fix handrails that are broken or loose. Make sure that handrails are as long as the stairways.  Check any carpeting to make sure that it is firmly  attached to the stairs. Fix any carpet that is loose or worn.  Avoid having throw rugs at the top or bottom of the stairs. If you do have throw rugs, attach them to the floor with carpet tape.  Make sure that you have a light switch at the top of the stairs and the bottom of the stairs. If you do not have them, ask someone to add them for you. What else can I do to help prevent falls?  Wear shoes that:  Do not have high heels.  Have rubber bottoms.  Are comfortable and fit you well.  Are closed at the toe. Do not wear sandals.  If you use a stepladder:  Make sure that it is fully opened. Do not climb a closed stepladder.  Make sure that both sides of the stepladder are locked into place.  Ask someone to hold it for you, if possible.  Clearly mark and make sure that you can see:  Any grab bars or handrails.  First and last steps.  Where the edge of each step is.  Use tools that help you move around (mobility aids) if they are needed. These include:  Canes.  Walkers.  Scooters.  Crutches.  Turn on the lights when you go into a dark area. Replace any light bulbs as soon as they burn out.  Set up your furniture so you have a clear path. Avoid moving your furniture around.  If any of your floors are uneven, fix them.  If there are any pets around you, be aware of where they are.  Review your medicines with your doctor. Some medicines can make you feel dizzy. This can increase your chance of falling. Ask your doctor what other things that you can do to help prevent falls. This information is not intended to replace advice given to you by your health care provider. Make sure you discuss any questions you have with your health care provider. Document Released: 10/19/2008 Document Revised: 05/31/2015 Document Reviewed: 01/27/2014 Elsevier Interactive Patient Education  2017 [employer].

## 2017-01-21 NOTE — Progress Notes (Signed)
Subjective:   Laurie Gray is a 62 y.o. female who presents for Medicare Annual (Subsequent) preventive examination.  Last AWV-01/21/2016    Objective:     Vitals: BP 128/80 (BP Location: Left Arm, Patient Position: Sitting)   Pulse 68   Temp 98.4 F (36.9 C) (Oral)   Ht 5\' 5"  (1.651 m)   Wt 135 lb (61.2 kg)   SpO2 93%   BMI 22.47 kg/m   Body mass index is 22.47 kg/m.  Advanced Directives 01/21/2017 12/24/2016 09/01/2016 08/23/2016 05/20/2016 01/31/2016 01/24/2016  Does Patient Have a Medical Advance Directive? No No No No No No No  Would patient like information on creating a medical advance directive? Yes (MAU/Ambulatory/Procedural Areas - Information given) - - No - Patient declined Yes (MAU/Ambulatory/Procedural Areas - Information given) No - Patient declined No - Patient declined    Tobacco Social History   Tobacco Use  Smoking Status Never Smoker  Smokeless Tobacco Never Used     Counseling given: Not Answered   Clinical Intake:  Pre-visit preparation completed: No  Pain : No/denies pain     Nutritional Risks: None Diabetes: No  How often do you need to have someone help you when you read instructions, pamphlets, or other written materials from your doctor or pharmacy?: 1 - Never What is the last grade level you completed in school?: [employer]  Interpreter Needed?: No  Information entered by :: Tyson Dense, RN  Past Medical History:  Diagnosis Date  . Anemia   . Anxiety    hx of-not on any meds  . Arthritis    "shoulders, arms, fingers, neck, back" (01/31/2016)  . Chronic cervical pain   . History of bronchitis   . History of colon polyps   . History of kidney stones   . HIV infection ([place]) dx'd 2000   takes odefsey  . Hyperlipidemia    takes Pravastatin daily  . Hypertension    takes Benicar HCT daily  . Hyperthyroidism   . Hypothyroidism   . Internal hemorrhoids   . Joint pain   . Migraine    "in the past" (01/31/2016)  . Pituitary  adenoma ([place])   . Refusal of blood transfusions as patient is Jehovah's Witness    "but substitutes are fine". (01/31/2016)  . Thyroid cancer ([place]) 12/2015  . Upper back pain    Past Surgical History:  Procedure Laterality Date  . ABDOMINAL HYSTERECTOMY    . BUNIONECTOMY Right   . COLONOSCOPY    . CYSTOSCOPY    . HAMMER TOE SURGERY Left    "middle toe was broke; turned into hammertoe then repaired"  . HAMMER TOE SURGERY Bilateral    "both small toes"  . PARATHYROIDECTOMY Right 01/31/2016  . PARATHYROIDECTOMY Right 01/31/2016   Procedure: PARATHYROIDECTOMY ON RIGHT SIDE;  Surgeon: Izora Gala, MD;  Location: [place];  Service: ENT;  Laterality: Right;  . SALPINGOOPHORECTOMY Bilateral   . THYROIDECTOMY N/A 01/31/2016   Procedure: TOTAL THYROIDECTOMY;  Surgeon: Izora Gala, MD;  Location: [facility];  Service: ENT;  Laterality: N/A;  . TOE SURGERY     bunion  . TONSILLECTOMY     as a child  . TOTAL THYROIDECTOMY  01/31/2016  . TUBAL LIGATION     30 yrs ago   Family History  Problem Relation Age of Onset  . Breast cancer Mother   . Hypertension Mother   . Cancer Mother   . Hyperlipidemia Mother   . Stroke Mother   .  Osteoarthritis Maternal Grandmother   . Breast cancer Maternal Grandmother   . Colon cancer Neg Hx    Social History   Socioeconomic History  . Marital status: Widowed    Spouse name: None  . Number of children: None  . Years of education: None  . Highest education level: None  Social Needs  . Financial resource strain: Not hard at all  . Food insecurity - worry: Never true  . Food insecurity - inability: Never true  . Transportation needs - medical: No  . Transportation needs - non-medical: No  Occupational History  . None  Tobacco Use  . Smoking status: Never Smoker  . Smokeless tobacco: Never Used  Substance and Sexual Activity  . Alcohol use: Yes    Alcohol/week: 0.0 oz    Comment: 01/31/2016 "might go out to eat and have a drink once/month; no more  than that"  . Drug use: No  . Sexual activity: Not Currently    Partners: Male    Comment: declined condoms  Other Topics Concern  . None  Social History Narrative   Normal diet   Drinks Caffeine- coffee/coke/chocolate    Lives in an apartment/ third level/1 person/1 pet   Past profession- Museum/gallery exhibitions officer, cosmetologist   Exercises 3-4 x weekly (cardio and weights)        Outpatient Encounter Medications as of 01/21/2017  Medication Sig  . Cyanocobalamin (B-12 PO) Take 1 tablet by mouth daily.  Marland Kitchen glucosamine-chondroitin 500-400 MG tablet Take 1 tablet by mouth 2 (two) times daily.  . hydrocortisone (PROCTO-PAK) 1 % CREA Twice daily as needed for hemorrhoids  . levothyroxine (SYNTHROID, LEVOTHROID) 88 MCG tablet Take 88 mcg by mouth daily before breakfast.  . Menthol-Camphor (ICY HOT ADVANCED RELIEF) 16-11 % CREA Apply 1 application topically 4 (four) times daily as needed (for shoulder pain).  . Multiple Vitamins-Minerals (MULTIVITAMIN ADULTS 50+ PO) Take 1 tablet by mouth daily.  . naproxen sodium (ANAPROX) 220 MG tablet Take 220 mg by mouth at bedtime.  . ODEFSEY 200-25-25 MG TABS tablet TAKE 1 TABLET BY MOUTH EVERY DAY WITH BREAKFAST  . olmesartan-hydrochlorothiazide (BENICAR HCT) 20-12.5 MG tablet TAKE 1 TABLET BY MOUTH DAILY.  Marland Kitchen omega-3 acid ethyl esters (LOVAZA) 1 g capsule TAKE 1 CAPSULE EVERY DAY  . pravastatin (PRAVACHOL) 40 MG tablet Take 1 tablet (40 mg total) by mouth daily.  . Tdap (BOOSTRIX) 5-2.5-18.5 LF-MCG/0.5 injection Inject 0.5 mLs into the muscle once for 1 dose.  . traMADol (ULTRAM) 50 MG tablet TAKE 1 TABLET 3 TIMES A DAY AS NEEDED FOR PAIN  . Zoster Vaccine Adjuvanted [facility]) injection Inject 0.5 mLs into the muscle once for 1 dose.  . [DISCONTINUED] Tdap (BOOSTRIX) 5-2.5-18.5 LF-MCG/0.5 injection Inject 0.5 mLs into the muscle once.  . [DISCONTINUED] Zoster Vaccine Adjuvanted [facility]) injection Inject 0.5 mLs into the muscle once.   No  facility-administered encounter medications on file as of 01/21/2017.     Activities of Daily Living In your present state of health, do you have any difficulty performing the following activities: 01/21/2017 01/31/2016  Hearing? N N  Vision? Y Y  Comment - "slightly"  Difficulty concentrating or making decisions? Y N  Walking or climbing stairs? N N  Dressing or bathing? N Y  Comment - "pain in shoulders/neck"  Doing errands, shopping? N N  Preparing Food and eating ? N -  Using the Toilet? N -  In the past six months, have you accidently leaked urine? N -  Do you  have problems with loss of bowel control? N -  Managing your Medications? N -  Managing your Finances? N -  Housekeeping or managing your Housekeeping? N -  Some recent data might be hidden    Patient Care Team: Lauree Chandler, NP as PCP - General (Geriatric Medicine) Campbell Riches, MD as PCP - Infectious Diseases (Infectious Diseases) Jackolyn Confer, MD as Consulting Physician (General Surgery)    Assessment:   This is a routine wellness examination for Rajanae.  Exercise Activities and Dietary recommendations Current Exercise Habits: The patient does not participate in regular exercise at present, Exercise limited by: orthopedic condition(s)  Goals    . Exercise 3x per week (30 min per time)     Patient would like to get to the gym 3 times a week       Fall Risk Fall Risk  01/21/2017 12/24/2016 12/04/2016 09/24/2016 09/01/2016  Falls in the past year? Yes No Yes No No  Number falls in past yr: 1 - 1 - -  Injury with Fall? No - No - -   Is the patient's home free of loose throw rugs in walkways, pet beds, electrical cords, etc?   yes      Grab bars in the bathroom? no      Handrails on the stairs?   yes      Adequate lighting?   yes  Timed Get Up and Go performed: 20 seconds, fall risk  Depression Screen PHQ 2/9 Scores 01/21/2017 12/04/2016 09/24/2016 06/03/2016  PHQ - 2 Score 1 1 4 1   PHQ- 9  Score - - 6 -     Cognitive Function within normal limits MMSE - Mini Mental State Exam 01/21/2016  Orientation to time 5  Orientation to Place 5  Registration 3  Attention/ Calculation 5  Recall 3  Language- name 2 objects 2  Language- repeat 1  Language- follow 3 step command 3  Language- read & follow direction 1  Write a sentence 1  Copy design 1  Total score 30        Immunization History  Administered Date(s) Administered  . Hepatitis B, adult 06/09/2013, 07/11/2013, 12/05/2013  . Influenza Split 09/08/2010, 11/10/2011  . Influenza Whole 12/25/2008, 09/24/2009  . Influenza,inj,Quad PF,6+ Mos 10/11/2012, 11/02/2013, 11/08/2014, 10/22/2015, 09/24/2016  . Pneumococcal Conjugate-13 03/28/2014  . Pneumococcal Polysaccharide-23 01/24/2010    Qualifies for Shingles Vaccine? Yes, educated and prescription sent to pharmacy  Screening Tests Health Maintenance  Topic Date Due  . TETANUS/TDAP  01/07/2020 (Originally 10/02/1974)  . MAMMOGRAM  08/14/2017  . PAP SMEAR  06/06/2019  . COLONOSCOPY  01/09/2025  . INFLUENZA VACCINE  Completed  . Hepatitis C Screening  Completed  . HIV Screening  Completed    Cancer Screenings: Lung: Low Dose CT Chest recommended if Age 36-80 years, 30 pack-year currently smoking OR have quit w/in 15years. Patient does not qualify. Breast:  Up to date on Mammogram? Yes   Up to date of Bone Density/Dexa? Yes Colorectal: up to date  Additional Screenings:  Hepatitis B/HIV/Syphillis: declined Hepatitis C Screening: declined     Plan:    I have personally reviewed and addressed the Medicare Annual Wellness questionnaire and have noted the following in the patient's chart:  A. Medical and social history B. Use of alcohol, tobacco or illicit drugs  C. Current medications and supplements D. Functional ability and status E.  Nutritional status F.  Physical activity G. Advance directives H. List of other physicians I.  Hospitalizations,  surgeries, and ER visits in previous 12 months J.  [place] to include hearing, vision, cognitive, depression L. Referrals and appointments - none  In addition, I have reviewed and discussed with patient certain preventive protocols, quality metrics, and best practice recommendations. A written personalized care plan for preventive services as well as general preventive health recommendations were provided to patient.  See attached scanned questionnaire for additional information.   Signed,   Tyson Dense, RN Nurse Health Advisor   Quick Notes   Health Maintenance: TDAP due, prescription printed and given to pt. Shingrix prescription sent to pharmacy     Abnormal Screen: None     Patient Concerns: Pain in joints, especially in the L wrist-put in a referral for rhematologist     Nurse Concerns: None

## 2017-01-26 ENCOUNTER — Ambulatory Visit: Payer: Medicare HMO

## 2017-02-01 ENCOUNTER — Encounter: Payer: Self-pay | Admitting: Nurse Practitioner

## 2017-02-02 ENCOUNTER — Other Ambulatory Visit: Payer: Self-pay | Admitting: Nurse Practitioner

## 2017-02-02 DIAGNOSIS — M2550 Pain in unspecified joint: Principal | ICD-10-CM

## 2017-02-02 DIAGNOSIS — G8929 Other chronic pain: Secondary | ICD-10-CM

## 2017-02-04 ENCOUNTER — Encounter: Payer: Self-pay | Admitting: Endocrinology

## 2017-02-04 ENCOUNTER — Ambulatory Visit: Payer: Medicare HMO | Admitting: Endocrinology

## 2017-02-04 VITALS — BP 116/77 | HR 83 | Temp 99.3°F | Ht 65.0 in | Wt 131.8 lb

## 2017-02-04 DIAGNOSIS — C73 Malignant neoplasm of thyroid gland: Secondary | ICD-10-CM

## 2017-02-04 DIAGNOSIS — M2550 Pain in unspecified joint: Secondary | ICD-10-CM | POA: Insufficient documentation

## 2017-02-04 DIAGNOSIS — E213 Hyperparathyroidism, unspecified: Secondary | ICD-10-CM | POA: Diagnosis not present

## 2017-02-04 LAB — T4, FREE: FREE T4: 1.09 ng/dL (ref 0.60–1.60)

## 2017-02-04 LAB — TSH: TSH: 0.97 u[IU]/mL (ref 0.35–4.50)

## 2017-02-04 LAB — VITAMIN D 25 HYDROXY (VIT D DEFICIENCY, FRACTURES): VITD: 42.82 ng/mL (ref 30.00–100.00)

## 2017-02-04 NOTE — Progress Notes (Signed)
Subjective:    Patient ID: Laurie Gray, female    DOB: [birth year], 62 y.o.   MRN: [id number]  HPI Pt is ref by Dr Johnnye Sima, for differentiated thyroid cancer, with this chronology: 1/18: thyroidectomy: PAPILLARY CARCINOMA, 0.9 CM. - FOCAL MICROSCOPIC EXTRATHYROIDAL EXTENSION PRESENT; FOCALLY INVOLVES RESECTION MARGIN 3/18: 108.6 mCi I-131 sodium iodide 3/18: RAI with thyrogen, 109 mCi 3/18: post-therapy scan: residual uptake in the thyroid bed. No evidence for distant metastatic disease.  She takes synthroid as rx'ed.  She has moderate arthralgias throughout the body, and assoc.   Past Medical History:  Diagnosis Date  . Anemia   . Anxiety    hx of-not on any meds  . Arthritis    "shoulders, arms, fingers, neck, back" (01/31/2016)  . Chronic cervical pain   . History of bronchitis   . History of colon polyps   . History of kidney stones   . HIV infection ([place]) dx'd 2000   takes odefsey  . Hyperlipidemia    takes Pravastatin daily  . Hypertension    takes Benicar HCT daily  . Hyperthyroidism   . Hypothyroidism   . Internal hemorrhoids   . Joint pain   . Migraine    "in the past" (01/31/2016)  . Pituitary adenoma ([place])   . Refusal of blood transfusions as patient is Jehovah's Witness    "but substitutes are fine". (01/31/2016)  . Thyroid cancer ([place]) 12/2015  . Upper back pain     Past Surgical History:  Procedure Laterality Date  . ABDOMINAL HYSTERECTOMY    . BUNIONECTOMY Right   . COLONOSCOPY    . CYSTOSCOPY    . HAMMER TOE SURGERY Left    "middle toe was broke; turned into hammertoe then repaired"  . HAMMER TOE SURGERY Bilateral    "both small toes"  . PARATHYROIDECTOMY Right 01/31/2016  . PARATHYROIDECTOMY Right 01/31/2016   Procedure: PARATHYROIDECTOMY ON RIGHT SIDE;  Surgeon: Izora Gala, MD;  Location: [facility];  Service: ENT;  Laterality: Right;  . SALPINGOOPHORECTOMY Bilateral   . THYROIDECTOMY N/A 01/31/2016   Procedure: TOTAL THYROIDECTOMY;  Surgeon:  Izora Gala, MD;  Location: [facility];  Service: ENT;  Laterality: N/A;  . TOE SURGERY     bunion  . TONSILLECTOMY     as a child  . TOTAL THYROIDECTOMY  01/31/2016  . TUBAL LIGATION     30 yrs ago    Social History   Socioeconomic History  . Marital status: Widowed    Spouse name: Not on file  . Number of children: Not on file  . Years of education: Not on file  . Highest education level: Not on file  Social Needs  . Financial resource strain: Not hard at all  . Food insecurity - worry: Never true  . Food insecurity - inability: Never true  . Transportation needs - medical: No  . Transportation needs - non-medical: No  Occupational History  . Not on file  Tobacco Use  . Smoking status: Never Smoker  . Smokeless tobacco: Never Used  Substance and Sexual Activity  . Alcohol use: Yes    Alcohol/week: 0.0 oz    Comment: 01/31/2016 "might go out to eat and have a drink once/month; no more than that"  . Drug use: No  . Sexual activity: Not Currently    Partners: Male    Comment: declined condoms  Other Topics Concern  . Not on file  Social History Narrative   Normal diet   Drinks  Caffeine- coffee/coke/chocolate    Lives in an apartment/ third level/1 person/1 pet   Past profession- Museum/gallery exhibitions officer, cosmetologist   Exercises 3-4 x weekly (cardio and weights)        Current Outpatient Medications on File Prior to Visit  Medication Sig Dispense Refill  . Cyanocobalamin (B-12 PO) Take 1 tablet by mouth daily.    Marland Kitchen glucosamine-chondroitin 500-400 MG tablet Take 1 tablet by mouth 2 (two) times daily.    . hydrocortisone (PROCTO-PAK) 1 % CREA Twice daily as needed for hemorrhoids 28.35 g 1  . levothyroxine (SYNTHROID, LEVOTHROID) 88 MCG tablet Take 88 mcg by mouth daily before breakfast.    . Menthol-Camphor (ICY HOT ADVANCED RELIEF) 16-11 % CREA Apply 1 application topically 4 (four) times daily as needed (for shoulder pain).    . Multiple Vitamins-Minerals (MULTIVITAMIN ADULTS  50+ PO) Take 1 tablet by mouth daily.    . naproxen sodium (ANAPROX) 220 MG tablet Take 220 mg by mouth at bedtime.    . ODEFSEY 200-25-25 MG TABS tablet TAKE 1 TABLET BY MOUTH EVERY DAY WITH BREAKFAST 90 tablet 2  . olmesartan-hydrochlorothiazide (BENICAR HCT) 20-12.5 MG tablet TAKE 1 TABLET BY MOUTH DAILY. 90 tablet 1  . omega-3 acid ethyl esters (LOVAZA) 1 g capsule TAKE 1 CAPSULE EVERY DAY 90 capsule 3  . pravastatin (PRAVACHOL) 40 MG tablet Take 1 tablet (40 mg total) by mouth daily. 90 tablet 1  . traMADol (ULTRAM) 50 MG tablet TAKE 1 TABLET 3 TIMES A DAY AS NEEDED FOR PAIN 90 tablet 0   No current facility-administered medications on file prior to visit.     Allergies  Allergen Reactions  . Crestor [Rosuvastatin Calcium]     myalgias  . Pravastatin     myalgias  . Sulfonamide Derivatives Swelling    SWELLING REACTION UNSPECIFIED     Family History  Problem Relation Age of Onset  . Breast cancer Mother   . Hypertension Mother   . Cancer Mother   . Hyperlipidemia Mother   . Stroke Mother   . Osteoarthritis Maternal Grandmother   . Breast cancer Maternal Grandmother   . Colon cancer Neg Hx     BP 116/77 (BP Location: Left Arm, Patient Position: Sitting, Cuff Size: Normal)   Pulse 83   Temp 99.3 F (37.4 C) (Oral)   Ht 5\' 5"  (1.651 m)   Wt 131 lb 12.8 oz (59.8 kg)   SpO2 95%   BMI 21.93 kg/m   Review of Systems denies muscle cramps, sob, weight gain, constipation, numbness, blurry vision, cold intolerance, rhinorrhea, easy bruising, and syncope.  She has dry skin, mild depression, difficulty with concentration, and dryness of the hair    Objective:   Physical Exam VS: see vs page GEN: no distress HEAD: head: no deformity eyes: no periorbital swelling, no proptosis external nose and ears are normal.  mouth: no lesion seen.   NECK: a healed scar is present.  I do not appreciate a nodule in the thyroid or elsewhere in the neck.   CHEST WALL: no deformity LUNGS:  clear to auscultation CV: reg rate and rhythm, no murmur ABD: abdomen is soft, nontender.  no hepatosplenomegaly.  not distended.  no hernia MUSCULOSKELETAL: muscle bulk and strength are grossly normal.  no obvious joint swelling.  gait is normal and steady EXTEMITIES: no deformity.  no edema PULSES: no carotid bruit NEURO:  cn 2-12 grossly intact.   readily moves all 4's.  sensation is intact to touch on all  4's SKIN:  Normal texture and temperature.  No rash or suspicious lesion is visible.   NODES:  None palpable at the neck PSYCH: alert, well-oriented.  Does not appear anxious nor depressed.  Lab Results  Component Value Date   TSH 0.97 02/04/2017   I have reviewed outside records, and summarized: Pt was noted to have thyroid cancer, and referred here.  Pt underwent thyroidectomy.  He had benign periop and postop course in the hospital      Assessment & Plan:  Differentiated thyroid cancer, stage 1, new to me, due for recheck Postsurgical hypothyroidism: well-replaced   Patient Instructions  blood tests are requested for you today.  We'll let you know about the results.  Please come back for a follow-up appointment in 6 months.

## 2017-02-04 NOTE — Patient Instructions (Signed)
blood tests are requested for you today.  We'll let you know about the results.   Please come back for a follow-up appointment in 6 months.  

## 2017-02-05 ENCOUNTER — Telehealth: Payer: Self-pay | Admitting: *Deleted

## 2017-02-05 NOTE — Telephone Encounter (Signed)
Patient called and left message on Clinical intake and stated that she left a form at our office for Laurie Gray to fill out and she was just confirming when it would be ready for pick up.  I returned patient's call and she stated that a CMA has already spoken with her and stated that it would be ready tomorrow.

## 2017-02-06 ENCOUNTER — Telehealth: Payer: Self-pay

## 2017-02-06 DIAGNOSIS — Z029 Encounter for administrative examinations, unspecified: Secondary | ICD-10-CM

## 2017-02-06 LAB — SEDIMENTATION RATE: SED RATE: 22 mm/h (ref 0–30)

## 2017-02-06 LAB — PTH, INTACT AND CALCIUM
CALCIUM: 10.3 mg/dL (ref 8.6–10.4)
PTH: 34 pg/mL (ref 14–64)

## 2017-02-06 LAB — THYROGLOBULIN LEVEL: THYROGLOBULIN: 0.1 ng/mL — AB

## 2017-02-06 LAB — RHEUMATOID FACTOR

## 2017-02-06 LAB — THYROGLOBULIN ANTIBODY: Thyroglobulin Ab: <1 IU/mL (ref <=1)

## 2017-02-06 NOTE — Telephone Encounter (Signed)
Spoke with patient per Jessica's request to inquire about reason for disability. Patient dropped off paperwork to be completed by Janett Billow.   Per Patient: Since 2002- patient on disability, diagnosis with HIV and medications made patient sick. Patient unable to work since due to anxiety, depression, and other medical diagnosis (see diagnosis list)  Janett Billow verbally informed.

## 2017-02-10 NOTE — Telephone Encounter (Signed)
Patient picked up paperwork Monday 02/09/17

## 2017-02-12 ENCOUNTER — Ambulatory Visit
Admission: RE | Admit: 2017-02-12 | Discharge: 2017-02-12 | Disposition: A | Discharge status: Home / Self Care | Payer: Medicare HMO | Source: Ambulatory Visit | Attending: Nurse Practitioner | Admitting: Nurse Practitioner

## 2017-02-12 DIAGNOSIS — Z1239 Encounter for other screening for malignant neoplasm of breast: Secondary | ICD-10-CM

## 2017-02-12 DIAGNOSIS — Z1231 Encounter for screening mammogram for malignant neoplasm of breast: Secondary | ICD-10-CM | POA: Diagnosis not present

## 2017-02-13 ENCOUNTER — Other Ambulatory Visit: Payer: Self-pay | Admitting: Nurse Practitioner

## 2017-02-13 DIAGNOSIS — R928 Other abnormal and inconclusive findings on diagnostic imaging of breast: Secondary | ICD-10-CM

## 2017-02-18 ENCOUNTER — Ambulatory Visit (INDEPENDENT_AMBULATORY_CARE_PROVIDER_SITE_OTHER): Payer: Medicare HMO | Admitting: Orthopedic Surgery

## 2017-02-18 ENCOUNTER — Encounter (INDEPENDENT_AMBULATORY_CARE_PROVIDER_SITE_OTHER): Payer: Self-pay | Admitting: Orthopedic Surgery

## 2017-02-18 ENCOUNTER — Ambulatory Visit
Admission: RE | Admit: 2017-02-18 | Discharge: 2017-02-18 | Disposition: A | Discharge status: Home / Self Care | Payer: Medicare HMO | Source: Ambulatory Visit | Attending: Nurse Practitioner | Admitting: Nurse Practitioner

## 2017-02-18 ENCOUNTER — Other Ambulatory Visit: Payer: Self-pay | Admitting: Nurse Practitioner

## 2017-02-18 ENCOUNTER — Ambulatory Visit (INDEPENDENT_AMBULATORY_CARE_PROVIDER_SITE_OTHER): Payer: Medicare HMO

## 2017-02-18 DIAGNOSIS — M79641 Pain in right hand: Secondary | ICD-10-CM | POA: Diagnosis not present

## 2017-02-18 DIAGNOSIS — N6012 Diffuse cystic mastopathy of left breast: Secondary | ICD-10-CM | POA: Diagnosis not present

## 2017-02-18 DIAGNOSIS — R922 Inconclusive mammogram: Secondary | ICD-10-CM | POA: Diagnosis not present

## 2017-02-18 DIAGNOSIS — M79642 Pain in left hand: Secondary | ICD-10-CM

## 2017-02-18 DIAGNOSIS — N6310 Unspecified lump in the right breast, unspecified quadrant: Secondary | ICD-10-CM

## 2017-02-18 DIAGNOSIS — R928 Other abnormal and inconclusive findings on diagnostic imaging of breast: Secondary | ICD-10-CM

## 2017-02-18 DIAGNOSIS — N6011 Diffuse cystic mastopathy of right breast: Secondary | ICD-10-CM | POA: Diagnosis not present

## 2017-02-19 ENCOUNTER — Other Ambulatory Visit: Payer: Self-pay | Admitting: Nurse Practitioner

## 2017-02-19 NOTE — Telephone Encounter (Signed)
A medication refill was received from pharmacy for tramadol 50 mg. Rx was called in to pharmacy after verifying last fill date, provider, and quantity on PMP [place] database.   

## 2017-02-21 NOTE — Progress Notes (Signed)
Office Visit Note   Patient: Laurie Gray           Date of Birth: 1955/08/10           MRN: [id number] Visit Date: 02/18/2017 Requested by: Lauree Chandler, NP [place], [place] [id number] PCP: Lauree Chandler, NP  Subjective: Chief Complaint  Patient presents with  . Hand Pain    bilateral hand and wrist pain    HPI: Patient presents with bilateral hand pain which is been "going on for a while".  Patient states the pain is getting worse.  She did have a fall on her outstretched hands last year.  She reports decreased grip strength in the hands and decreased functionality in the hands.  She has a rheumatologic appointment in April.  She is only taking tramadol for joint pain.  She cannot take anti-inflammatories.  She has difficulty closing her right hand.          ROS: All systems reviewed are negative as they relate to the chief complaint within the history of present illness.  Patient denies  fevers or chills.   Assessment & Plan: Visit Diagnoses:  1. Bilateral hand pain     Plan: Impression is right hand pain with some early arthritis noted in the right hand.  She cannot make a fist in that right hand.  This does not really look like any type of rheumatoid arthritis type problem.  Joints themselves are not warm.  I would try her with a wrist splint for the left wrist.  No evidence of snuffbox tenderness or scaphoid fracture on radiographs.  We will see what the rheumatologist has to say about this but I do not think there is an orthopedic surgical problem at this time.  Follow-Up Instructions: Return if symptoms worsen or fail to improve.   Orders:  Orders Placed This Encounter  Procedures  . XR Hand Complete Left  . XR Hand Complete Right   No orders of the defined types were placed in this encounter.     Procedures: No procedures performed   Clinical Data: No additional findings.  Objective: Vital Signs: There were no vitals taken for this  visit.  Physical Exam:   Constitutional: Patient appears well-developed HEENT:  Head: Normocephalic Eyes:EOM are normal Neck: Normal range of motion Cardiovascular: Normal rate Pulmonary/chest: Effort normal Neurologic: Patient is alert Skin: Skin is warm Psychiatric: Patient has normal mood and affect    Ortho Exam: Bilateral hand exam demonstrates negative Tinel's at the elbow.  Wrist range of motion is full.  On that left wrist she does have some tenderness over the radial styloid but no snuffbox tenderness on either side.  Wrist range of motion is full.  Right hand does not quite make a fist at the left hand does.  Right fifth finger has PIP flexion deformity.  Flexor and extensor tendons intact across the wrist.  Radial pulse intact bilaterally.  Negative carpal tunnel compression testing bilaterally.  No joint warmth synovitis or significant inflammation present in the joints except for the PIP joints 2 and 3 in the right hand.  Specialty Comments:  No specialty comments available.  Imaging: No results found.   PMFS History: Patient Active Problem List   Diagnosis Date Noted  . Arthralgia 02/04/2017  . Hyperparathyroidism ([place]) 02/04/2017  . Skin lesion of left arm 12/04/2016  . Poor dentition 12/04/2016  . Adhesive capsulitis of both shoulders 09/24/2016  . High risk medication use  09/24/2016  . Chronic right shoulder pain 02/18/2016  . Chronic left shoulder pain 02/18/2016  . Neck pain 02/18/2016  . 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 Status post surgery 05/29/2015  . Hammertoe 04/02/2015  . S/P bunionectomy 11/08/2014  . Arthritis 07/07/2014  . Osteopenia 04/19/2014  . Routine general medical examination at a health care facility 03/28/2014  . Depression 11/02/2013  . Hyperlipidemia 11/02/2013  . Vaginal dysplasia 04/28/2013  . Vaginal atrophy 04/06/2013  . Right hip pain 08/23/2012  . Pain, dental 04/07/2012  . Vaginal bleeding 02/17/2012    . 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 Headache 12/01/2011  . Pharyngitis 07/21/2011  . Cellulitis 07/03/2010  . 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 Disorder of bone and cartilage 09/27/2009  . HAND PAIN, RIGHT 09/24/2009  . Acute URI 01/08/2009  . ESSENTIAL HYPERTENSION, BENIGN 07/27/2008  . BREAST MASS 07/03/2008  . Human immunodeficiency virus (HIV) disease ([place]) 04/03/2008  . HSV 04/03/2008  . 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 Anemia   . Anxiety    hx of-not on any meds  . Arthritis    "shoulders, arms, fingers, neck, back" (01/31/2016)  . Chronic cervical pain   . History of bronchitis   . History of colon polyps   . History of kidney stones   . HIV infection ([place]) dx'd 2000   takes odefsey  . Hyperlipidemia    takes Pravastatin daily  . Hypertension    takes Benicar HCT daily  . Hyperthyroidism   . Hypothyroidism   . Internal hemorrhoids   . Joint pain   . Migraine    "in the past" (01/31/2016)  . Pituitary adenoma ([place])   . Refusal of blood transfusions as patient is Jehovah's Witness    "but substitutes are fine". (01/31/2016)  . Thyroid cancer ([place]) 12/2015  . Upper back pain     Family History  Problem Relation Age of Onset  . Breast cancer Mother 42  . Hypertension Mother   . Cancer Mother   . Hyperlipidemia Mother   . Stroke Mother   . Osteoarthritis Maternal Grandmother   . Breast cancer Maternal Grandmother   . Colon cancer Neg Hx     Past Surgical History:  Procedure Laterality Date  . ABDOMINAL HYSTERECTOMY    . BREAST EXCISIONAL BIOPSY Left    no scar seen   . BUNIONECTOMY Right   . COLONOSCOPY    . CYSTOSCOPY    . HAMMER TOE SURGERY Left    "middle toe was broke; turned into hammertoe then repaired"  . HAMMER TOE SURGERY Bilateral    "both small toes"  . PARATHYROIDECTOMY Right 01/31/2016  . PARATHYROIDECTOMY Right 01/31/2016   Procedure: PARATHYROIDECTOMY ON RIGHT SIDE;  Surgeon: Izora Gala,  MD;  Location: [facility];  Service: ENT;  Laterality: Right;  . SALPINGOOPHORECTOMY Bilateral   . THYROIDECTOMY N/A 01/31/2016   Procedure: TOTAL THYROIDECTOMY;  Surgeon: Izora Gala, MD;  Location: [facility];  Service: ENT;  Laterality: N/A;  . TOE SURGERY     bunion  . TONSILLECTOMY     as a child  . TOTAL THYROIDECTOMY  01/31/2016  . TUBAL LIGATION     30 yrs ago   Social History   Occupational History  . Not on file  Tobacco Use  . Smoking status: Never Smoker  . Smokeless tobacco: Never Used  Substance and Sexual Activity  . Alcohol use: Yes    Alcohol/week: 0.0  oz    Comment: 01/31/2016 "might go out to eat and have a drink once/month; no more than that"  . Drug use: No  . Sexual activity: Not Currently    Partners: Male    Comment: declined condoms

## 2017-02-22 ENCOUNTER — Other Ambulatory Visit: Payer: Self-pay | Admitting: Nurse Practitioner

## 2017-03-18 ENCOUNTER — Other Ambulatory Visit: Payer: Self-pay | Admitting: Nurse Practitioner

## 2017-03-19 NOTE — Telephone Encounter (Signed)
A medication refill was received from pharmacy for tramadol 50 mg. Rx was called in to pharmacy after verifying last fill date, provider, and quantity on PMP [place] database.   

## 2017-03-23 ENCOUNTER — Other Ambulatory Visit: Payer: Self-pay | Admitting: Internal Medicine

## 2017-04-12 ENCOUNTER — Other Ambulatory Visit: Payer: Self-pay | Admitting: Infectious Diseases

## 2017-04-12 DIAGNOSIS — E785 Hyperlipidemia, unspecified: Secondary | ICD-10-CM

## 2017-04-17 ENCOUNTER — Other Ambulatory Visit: Payer: Self-pay | Admitting: Nurse Practitioner

## 2017-04-20 ENCOUNTER — Other Ambulatory Visit: Payer: Self-pay | Admitting: Nurse Practitioner

## 2017-04-23 ENCOUNTER — Ambulatory Visit: Payer: Self-pay | Admitting: Nurse Practitioner

## 2017-04-29 ENCOUNTER — Encounter: Payer: Self-pay | Admitting: Nurse Practitioner

## 2017-04-29 ENCOUNTER — Ambulatory Visit (INDEPENDENT_AMBULATORY_CARE_PROVIDER_SITE_OTHER): Payer: Medicare Other | Admitting: Nurse Practitioner

## 2017-04-29 VITALS — BP 114/72 | HR 68 | Temp 98.2°F | Ht 65.0 in | Wt 131.0 lb

## 2017-04-29 DIAGNOSIS — G8929 Other chronic pain: Secondary | ICD-10-CM | POA: Diagnosis not present

## 2017-04-29 DIAGNOSIS — E890 Postprocedural hypothyroidism: Secondary | ICD-10-CM | POA: Diagnosis not present

## 2017-04-29 DIAGNOSIS — F419 Anxiety disorder, unspecified: Secondary | ICD-10-CM | POA: Diagnosis not present

## 2017-04-29 DIAGNOSIS — E7801 Familial hypercholesterolemia: Secondary | ICD-10-CM

## 2017-04-29 DIAGNOSIS — M2550 Pain in unspecified joint: Secondary | ICD-10-CM

## 2017-04-29 DIAGNOSIS — R221 Localized swelling, mass and lump, neck: Secondary | ICD-10-CM

## 2017-04-29 DIAGNOSIS — F329 Major depressive disorder, single episode, unspecified: Secondary | ICD-10-CM

## 2017-04-29 DIAGNOSIS — F32A Depression, unspecified: Secondary | ICD-10-CM

## 2017-04-29 LAB — COMPLETE METABOLIC PANEL WITH GFR
AG Ratio: 2 (calc) (ref 1.0–2.5)
ALT: 18 U/L (ref 6–29)
AST: 19 U/L (ref 10–35)
Albumin: 4.3 g/dL (ref 3.6–5.1)
Alkaline phosphatase (APISO): 50 U/L (ref 33–130)
BUN/Creatinine Ratio: 12 (calc) (ref 6–22)
BUN: 13 mg/dL (ref 7–25)
CALCIUM: 9.5 mg/dL (ref 8.6–10.4)
CO2: 31 mmol/L (ref 20–32)
Chloride: 103 mmol/L (ref 98–110)
Creat: 1.05 mg/dL — ABNORMAL HIGH (ref 0.50–0.99)
GFR, EST AFRICAN AMERICAN: 66 mL/min/{1.73_m2} (ref >=60)
GFR, EST NON AFRICAN AMERICAN: 57 mL/min/{1.73_m2} — AB (ref >=60)
GLUCOSE: 93 mg/dL (ref 65–99)
Globulin: 2.2 g/dL (calc) (ref 1.9–3.7)
POTASSIUM: 3.8 mmol/L (ref 3.5–5.3)
Sodium: 140 mmol/L (ref 135–146)
TOTAL PROTEIN: 6.5 g/dL (ref 6.1–8.1)
Total Bilirubin: 0.4 mg/dL (ref 0.2–1.2)

## 2017-04-29 LAB — LIPID PANEL
CHOL/HDL RATIO: 3.2 (calc) (ref <5.0)
Cholesterol: 208 mg/dL — ABNORMAL HIGH (ref <200)
HDL: 65 mg/dL (ref >50)
LDL Cholesterol (Calc): 125 mg/dL (calc) — ABNORMAL HIGH
NON-HDL CHOLESTEROL (CALC): 143 mg/dL — AB (ref <130)
Triglycerides: 85 mg/dL (ref <150)

## 2017-04-29 MED ORDER — DULOXETINE HCL 30 MG PO CPEP: 30.0000 mg | ORAL_CAPSULE | Freq: Every day | ORAL | 3 refills | Status: DC

## 2017-04-29 NOTE — Progress Notes (Signed)
Careteam: Patient Care Team: Lauree Chandler, NP as PCP - General (Geriatric Medicine) Campbell Riches, MD as PCP - Infectious Diseases (Infectious Diseases) Jackolyn Confer, MD as Consulting Physician (General Surgery)  Advanced Directive information Does Patient Have a Medical Advance Directive?: No, Would patient like information on creating a medical advance directive?: Yes (MAU/Ambulatory/Procedural Areas - Information given)  Allergies  Allergen Reactions  . Crestor [Rosuvastatin Calcium]     myalgias  . Pravastatin     myalgias  . Sulfonamide Derivatives Swelling    SWELLING REACTION UNSPECIFIED     Chief Complaint  Patient presents with  . Medical Management of Chronic Issues    Pt is being seen for a 4 month routine visit. Pt reports that she has been in severe pain and has a lot of extreme tiredness.   . ACP    Needs ACP- Pt has paperwork     HPI: Patient is a 62 y.o. female seen in the office today for routine follow up.  Pt with worsening joint pain in hands and followed with rheumatology. Naprosyn BID recommended but she is unable to tolerate due to nausea and upset stomach so she was not able to continue and had to resume tramadol TID. Tramadol is effective in relieving the pain.  She is following up with rheumatology on May 15th.   Also suffering from anxiety and depression- reports she taking tramadol for anxiety. Had been on Cymbalta in the past but did not like the idea of being on an "antidepressant"  Reports she stays in the bed a lot, only gets up to help her mother who suffers with dementia, reports this is probably contributing to depression.   After thyroid sx reports a knot in her neck, increased after surgery, ?scar tissue, also with decrease in voice  Hypothyroid- following with endocrinology, has 2 Rx at the pharmacy and unsure which medication for thyroid she was supposed to take.   htn- stable on current regimen  HIV- continues on  odefsey by ID   Hyperlipidemia- continues on Pravachol had muscle aches with medication prior but it was unchanged when she stopped medication but cholesterol increased therefore she started back on medication and myalagias were unchanged.  Review of Systems:  Review of Systems  Constitutional: Positive for malaise/fatigue. Negative for chills and fever.  Respiratory: Negative for cough, sputum production and shortness of breath.   Cardiovascular: Positive for palpitations (with increase in anxiety). Negative for chest pain and leg swelling.  Gastrointestinal: Negative for abdominal pain, constipation, diarrhea and heartburn.  Genitourinary: Negative for dysuria, frequency and urgency.  Musculoskeletal: Positive for joint pain and myalgias. Negative for back pain and falls.  Skin: Negative.   Neurological: Positive for weakness. Negative for dizziness, tingling and headaches.  Psychiatric/Behavioral: Positive for depression. Negative for memory loss. The patient is nervous/anxious and has insomnia.     Past Medical History:  Diagnosis Date  . Anemia   . Anxiety    hx of-not on any meds  . Arthritis    "shoulders, arms, fingers, neck, back" (01/31/2016)  . Chronic cervical pain   . History of bronchitis   . History of colon polyps   . History of kidney stones   . HIV infection ([place]) dx'd 2000   takes odefsey  . Hyperlipidemia    takes Pravastatin daily  . Hypertension    takes Benicar HCT daily  . Hyperthyroidism   . Hypothyroidism   . Internal hemorrhoids   . Joint  pain   . Migraine    "in the past" (01/31/2016)  . Pituitary adenoma ([place])   . Refusal of blood transfusions as patient is Jehovah's Witness    "but substitutes are fine". (01/31/2016)  . Thyroid cancer ([place]) 12/2015  . Upper back pain    Past Surgical History:  Procedure Laterality Date  . ABDOMINAL HYSTERECTOMY    . BREAST EXCISIONAL BIOPSY Left    no scar seen   . BUNIONECTOMY Right   . COLONOSCOPY    .  CYSTOSCOPY    . HAMMER TOE SURGERY Left    "middle toe was broke; turned into hammertoe then repaired"  . HAMMER TOE SURGERY Bilateral    "both small toes"  . PARATHYROIDECTOMY Right 01/31/2016  . PARATHYROIDECTOMY Right 01/31/2016   Procedure: PARATHYROIDECTOMY ON RIGHT SIDE;  Surgeon: Izora Gala, MD;  Location: [facility];  Service: ENT;  Laterality: Right;  . SALPINGOOPHORECTOMY Bilateral   . THYROIDECTOMY N/A 01/31/2016   Procedure: TOTAL THYROIDECTOMY;  Surgeon: Izora Gala, MD;  Location: [facility];  Service: ENT;  Laterality: N/A;  . TOE SURGERY     bunion  . TONSILLECTOMY     as a child  . TOTAL THYROIDECTOMY  01/31/2016  . TUBAL LIGATION     30 yrs ago   Social History:   reports that she has never smoked. She has never used smokeless tobacco. She reports that she drinks alcohol. She reports that she does not use drugs.  Family History  Problem Relation Age of Onset  . Breast cancer Mother 44  . Hypertension Mother   . Cancer Mother   . Hyperlipidemia Mother   . Stroke Mother   . Osteoarthritis Maternal Grandmother   . Breast cancer Maternal Grandmother   . Colon cancer Neg Hx     Medications: Patient's Medications  New Prescriptions   No medications on file  Previous Medications   CYANOCOBALAMIN (B-12 PO)    Take 1 tablet by mouth daily.   GLUCOSAMINE-CHONDROITIN 500-400 MG TABLET    Take 1 tablet by mouth 2 (two) times daily.   HYDROCORTISONE (PROCTO-PAK) 1 % CREA    Twice daily as needed for hemorrhoids   LEVOTHYROXINE (SYNTHROID, LEVOTHROID) 88 MCG TABLET    Take 88 mcg by mouth daily before breakfast.   MENTHOL-CAMPHOR (ICY HOT ADVANCED RELIEF) 16-11 % CREA    Apply 1 application topically 4 (four) times daily as needed (for shoulder pain).   MULTIPLE VITAMINS-MINERALS (MULTIVITAMIN ADULTS 50+ PO)    Take 1 tablet by mouth daily.   NAPROXEN SODIUM (ANAPROX) 220 MG TABLET    Take 220 mg by mouth at bedtime.   ODEFSEY 200-25-25 MG TABS TABLET    TAKE 1 TABLET BY MOUTH  EVERY DAY WITH BREAKFAST   OLMESARTAN-HYDROCHLOROTHIAZIDE (BENICAR HCT) 20-12.5 MG TABLET    TAKE 1 TABLET BY MOUTH DAILY.   OMEGA-3 ACID ETHYL ESTERS (LOVAZA) 1 G CAPSULE    TAKE 1 CAPSULE EVERY DAY   PRAVASTATIN (PRAVACHOL) 40 MG TABLET    Take 1 tablet (40 mg total) by mouth daily.   TRAMADOL (ULTRAM) 50 MG TABLET    TAKE 1 TABLET BY MOUTH THREE TIMES A DAY AS NEEDED FOR PAIN  Modified Medications   No medications on file  Discontinued Medications   No medications on file     Physical Exam:  Vitals:   04/29/17 0832  BP: 114/72  Pulse: 68  Temp: 98.2 F (36.8 C)  TempSrc: Oral  SpO2: 99%  Weight: 131 lb (59.4 kg)  Height: 5\' 5"  (1.651 m)   Body mass index is 21.8 kg/m.  Physical Exam  Constitutional: She is oriented to person, place, and time. She appears well-developed and well-nourished.  HENT:  Head: Normocephalic and atraumatic.  Mouth/Throat: Oropharynx is clear and moist. No oropharyngeal exudate.  Eyes: Pupils are equal, round, and reactive to light. No scleral icterus.  Neck: Neck supple. Carotid bruit is not present. No tracheal deviation present.  Cardiovascular: Normal rate, regular rhythm and normal heart sounds.  No LE edema b/l. no calf TTP.   Pulmonary/Chest: Effort normal and breath sounds normal. No stridor.  Abdominal: Soft. Normal appearance and bowel sounds are normal. She exhibits no distension and no mass. There is no hepatomegaly. There is no tenderness. There is no rigidity and no guarding.  Musculoskeletal: She exhibits tenderness (R>L shoulder anteriorly with markedly reduced ROM on right). She exhibits no edema.  Right palmar tendon cysts, increase in pain and swelling to joints of right hand  Lymphadenopathy:    She has no cervical adenopathy.  Neurological: She is alert and oriented to person, place, and time.  Skin: Skin is warm and dry. No rash noted.  Psychiatric: She has a normal mood and affect. Her behavior is normal. Judgment and  thought content normal.    Labs reviewed: Basic Metabolic Panel: Recent Labs    08/23/16 1914 12/04/16 1527 12/22/16 0842 02/04/17 1627  NA 137 138 143  --   K 3.3* 4.1 4.0  --   CL 99* 101 105  --   CO2 27 29 34*  --   GLUCOSE 139* 105* 118*  --   BUN 10 11 14   --   CREATININE 1.11* 0.91 1.05*  --   CALCIUM 9.6 9.9 9.6 10.3  TSH  --   --   --  0.97   Liver Function Tests: Recent Labs    05/20/16 1403 06/03/16 1603 12/04/16 1527 12/22/16 0842  AST 17 18 23 17   ALT 15 15 19 14   ALKPHOS 58 56  --   --   BILITOT 0.4 0.3 0.4 0.4  PROT 7.3 6.5 7.1 6.4  ALBUMIN 4.4 4.1  --   --    No results for input(s): LIPASE, AMYLASE in the last 8760 hours. No results for input(s): AMMONIA in the last 8760 hours. CBC: Recent Labs    12/22/16 0842 12/31/16 1039 01/08/17 1347  WBC 3.2* 3.0* 4.5  NEUTROABS 1,264* 1,236* 2,223  HGB 11.6* 11.2* 12.4  HCT 34.9* 33.7* 37.1  MCV 91.6 91.8 91.8  PLT 186 190 214   Lipid Panel: Recent Labs    09/22/16 0944 12/22/16 0842  CHOL 310* 207*  HDL 75 64  LDLCALC 218* 127*  TRIG 68 72  CHOLHDL 4.1 3.2   TSH: Recent Labs    02/04/17 1627  TSH 0.97   A1C: Lab Results  Component Value Date   HGBA1C 5.6 11/19/2015     Assessment/Plan 1. Anxiety and depression -worsening anxiety and depression with increase in pain and being a caregiver for her mother. - DULoxetine (CYMBALTA) 30 MG capsule; Take 1 capsule (30 mg total) by mouth daily.  Dispense: 30 capsule; Refill: 3 -precautions given   2. Familial hypercholesterolemia -continues on Pravachol  - Lipid Panel - COMPLETE METABOLIC PANEL WITH GFR  3. Chronic joint pain - following with rheumatologist due to pain, could not tolerate naprosyn due to GI upset. Continues with tramadol 50 mg TID.  -  DULoxetine (CYMBALTA) 30 MG capsule; Take 1 capsule (30 mg total) by mouth daily.  Dispense: 30 capsule; Refill: 3  4. Postoperative hypothyroidism Following with endocrinology who  is monitoring TSH. Unsure of correct dose of synthroid that she is supposed to be taking, planning on calling to clarify.   5. Lump in neck Lump not appreciated on exam however pt reports voice changes and pulling sensation with movement, advised to follow up with ENT, she is in agreement.   Next appt: 4 weeks for mood.  Carlos American. [place], [facility] [phone]

## 2017-04-29 NOTE — Patient Instructions (Signed)
To start cymbalta 30 mg for anxiety, depression, pain To follow up in office in 4 weeks on mood

## 2017-05-04 ENCOUNTER — Telehealth: Payer: Self-pay | Admitting: Nurse Practitioner

## 2017-05-04 ENCOUNTER — Encounter: Payer: Self-pay | Admitting: Nurse Practitioner

## 2017-05-04 NOTE — Telephone Encounter (Signed)
Opened in error

## 2017-05-18 ENCOUNTER — Other Ambulatory Visit: Payer: Self-pay | Admitting: Nurse Practitioner

## 2017-05-19 ENCOUNTER — Other Ambulatory Visit: Payer: Self-pay | Admitting: *Deleted

## 2017-05-19 MED ORDER — TRAMADOL HCL 50 MG PO TABS: ORAL_TABLET | ORAL | 0 refills | Status: DC

## 2017-05-19 NOTE — Telephone Encounter (Signed)
Patient called requesting Refill [place] Verified LR: 04/21/17 Pharmacy Confirmed Pended Rx and sent to [facility] for approval.

## 2017-05-20 ENCOUNTER — Other Ambulatory Visit: Payer: Medicare HMO

## 2017-05-24 ENCOUNTER — Other Ambulatory Visit: Payer: Self-pay | Admitting: Nurse Practitioner

## 2017-05-24 DIAGNOSIS — M2550 Pain in unspecified joint: Secondary | ICD-10-CM

## 2017-05-24 DIAGNOSIS — F329 Major depressive disorder, single episode, unspecified: Secondary | ICD-10-CM

## 2017-05-24 DIAGNOSIS — F32A Depression, unspecified: Secondary | ICD-10-CM

## 2017-05-24 DIAGNOSIS — G8929 Other chronic pain: Secondary | ICD-10-CM

## 2017-05-24 DIAGNOSIS — F419 Anxiety disorder, unspecified: Principal | ICD-10-CM

## 2017-05-25 ENCOUNTER — Ambulatory Visit: Payer: Medicare Other | Admitting: Nurse Practitioner

## 2017-05-25 NOTE — Telephone Encounter (Signed)
Dx code Anxiety and depression (F41.9, F32.9). Chronic joint pain M25.50 and G.89.29

## 2017-05-28 ENCOUNTER — Ambulatory Visit: Payer: Medicare Other | Admitting: Nurse Practitioner

## 2017-05-31 ENCOUNTER — Other Ambulatory Visit: Payer: Self-pay | Admitting: Nurse Practitioner

## 2017-06-03 ENCOUNTER — Ambulatory Visit: Payer: Medicare HMO | Admitting: Infectious Diseases

## 2017-06-04 ENCOUNTER — Ambulatory Visit: Payer: Medicare Other | Admitting: Nurse Practitioner

## 2017-06-11 ENCOUNTER — Other Ambulatory Visit: Payer: Medicare Other

## 2017-06-11 ENCOUNTER — Encounter: Payer: Self-pay | Admitting: Nurse Practitioner

## 2017-06-11 ENCOUNTER — Ambulatory Visit (INDEPENDENT_AMBULATORY_CARE_PROVIDER_SITE_OTHER): Payer: Medicare Other | Admitting: Nurse Practitioner

## 2017-06-11 DIAGNOSIS — Z79899 Other long term (current) drug therapy: Secondary | ICD-10-CM

## 2017-06-11 DIAGNOSIS — G8929 Other chronic pain: Secondary | ICD-10-CM | POA: Diagnosis not present

## 2017-06-11 DIAGNOSIS — F419 Anxiety disorder, unspecified: Secondary | ICD-10-CM | POA: Diagnosis not present

## 2017-06-11 DIAGNOSIS — F329 Major depressive disorder, single episode, unspecified: Secondary | ICD-10-CM | POA: Diagnosis not present

## 2017-06-11 DIAGNOSIS — B20 Human immunodeficiency virus [HIV] disease: Secondary | ICD-10-CM

## 2017-06-11 DIAGNOSIS — M2550 Pain in unspecified joint: Secondary | ICD-10-CM

## 2017-06-11 DIAGNOSIS — Z113 Encounter for screening for infections with a predominantly sexual mode of transmission: Secondary | ICD-10-CM

## 2017-06-11 DIAGNOSIS — F32A Depression, unspecified: Secondary | ICD-10-CM

## 2017-06-11 MED ORDER — DULOXETINE HCL 60 MG PO CPEP: 60.0000 mg | ORAL_CAPSULE | Freq: Every day | ORAL | 0 refills | Status: DC

## 2017-06-11 NOTE — Patient Instructions (Signed)
Increase Cymbalta to 60 mg by mouth daily    

## 2017-06-11 NOTE — Progress Notes (Signed)
Careteam: Patient Care Team: Lauree Chandler, NP as PCP - General (Geriatric Medicine) Campbell Riches, MD as PCP - Infectious Diseases (Infectious Diseases) Jackolyn Confer, MD as Consulting Physician (General Surgery)  Advanced Directive information Does Patient Have a Medical Advance Directive?: No  Allergies  Allergen Reactions  . Crestor [Rosuvastatin Calcium]     myalgias  . Pravastatin     myalgias  . Sulfonamide Derivatives Swelling    SWELLING REACTION UNSPECIFIED     Chief Complaint  Patient presents with  . Follow-up    Pt is being seen for a 4 week follow up on mood.      HPI: Patient is a 62 y.o. female seen in the office today for 4 week follow up on mood.  Pt was started on Cymbalta at last OV due to anxiety and chronic pain. She is currently seeing rheumatology  Reports she is not sure if she sees any difference with this. No side effects. Maybe helping her mood a little. Still feels like anxious and depression is bad- sister just found out she had breast cancer and discovered she had positive genetic (brca gene) testing. She is going to have a repeat ultrasound in august would like to wait until then before possibly being referred to genetic counselor.  Having to take care of her mother who requires a lot of care Reports Cymbalta helping pain. Sleeping better at night and not having to use as much pain medication- uses less naproxen.   Review of Systems:  Review of Systems  Constitutional: Positive for malaise/fatigue. Negative for chills and fever.  Respiratory: Negative for cough, sputum production and shortness of breath.   Cardiovascular: Positive for palpitations (with increase in anxiety). Negative for chest pain and leg swelling.  Gastrointestinal: Negative for abdominal pain, constipation, diarrhea and heartburn.  Genitourinary: Negative for dysuria, frequency and urgency.  Musculoskeletal: Positive for joint pain and myalgias. Negative for  back pain and falls.       Using less pain medication  Skin: Negative.   Neurological: Positive for weakness. Negative for dizziness, tingling and headaches.  Psychiatric/Behavioral: Positive for depression. Negative for memory loss. The patient is nervous/anxious and has insomnia.        Sleeping better   Past Medical History:  Diagnosis Date  . Anemia   . Anxiety    hx of-not on any meds  . Arthritis    "shoulders, arms, fingers, neck, back" (01/31/2016)  . Chronic cervical pain   . History of bronchitis   . History of colon polyps   . History of kidney stones   . HIV infection ([place]) dx'd 2000   takes odefsey  . Hyperlipidemia    takes Pravastatin daily  . Hypertension    takes Benicar HCT daily  . Hyperthyroidism   . Hypothyroidism   . Internal hemorrhoids   . Joint pain   . Migraine    "in the past" (01/31/2016)  . Pituitary adenoma ([place])   . Refusal of blood transfusions as patient is Jehovah's Witness    "but substitutes are fine". (01/31/2016)  . Thyroid cancer ([place]) 12/2015  . Upper back pain    Past Surgical History:  Procedure Laterality Date  . ABDOMINAL HYSTERECTOMY    . BREAST EXCISIONAL BIOPSY Left    no scar seen   . BUNIONECTOMY Right   . COLONOSCOPY    . CYSTOSCOPY    . HAMMER TOE SURGERY Left    "middle toe was broke; turned  into hammertoe then repaired"  . HAMMER TOE SURGERY Bilateral    "both small toes"  . PARATHYROIDECTOMY Right 01/31/2016  . PARATHYROIDECTOMY Right 01/31/2016   Procedure: PARATHYROIDECTOMY ON RIGHT SIDE;  Surgeon: Izora Gala, MD;  Location: [facility];  Service: ENT;  Laterality: Right;  . SALPINGOOPHORECTOMY Bilateral   . THYROIDECTOMY N/A 01/31/2016   Procedure: TOTAL THYROIDECTOMY;  Surgeon: Izora Gala, MD;  Location: [facility];  Service: ENT;  Laterality: N/A;  . TOE SURGERY     bunion  . TONSILLECTOMY     as a child  . TOTAL THYROIDECTOMY  01/31/2016  . TUBAL LIGATION     30 yrs ago   Social History:   reports that she  has never smoked. She has never used smokeless tobacco. She reports that she drinks alcohol. She reports that she does not use drugs.  Family History  Problem Relation Age of Onset  . Breast cancer Mother 73  . Hypertension Mother   . Cancer Mother   . Hyperlipidemia Mother   . Stroke Mother   . Osteoarthritis Maternal Grandmother   . Breast cancer Maternal Grandmother   . Colon cancer Neg Hx     Medications: Patient's Medications  New Prescriptions   No medications on file  Previous Medications   CYANOCOBALAMIN (B-12 PO)    Take 1 tablet by mouth daily.   DULOXETINE (CYMBALTA) 30 MG CAPSULE    TAKE 1 CAPSULE BY MOUTH EVERY DAY   GLUCOSAMINE-CHONDROITIN 500-400 MG TABLET    Take 1 tablet by mouth 2 (two) times daily.   HYDROCORTISONE (PROCTO-PAK) 1 % CREA    Twice daily as needed for hemorrhoids   LEVOTHYROXINE (SYNTHROID, LEVOTHROID) 88 MCG TABLET    Take 88 mcg by mouth daily before breakfast.   MENTHOL-CAMPHOR (ICY HOT ADVANCED RELIEF) 16-11 % CREA    Apply 1 application topically 4 (four) times daily as needed (for shoulder pain).   MULTIPLE VITAMINS-MINERALS (MULTIVITAMIN ADULTS 50+ PO)    Take 1 tablet by mouth daily.   NAPROXEN SODIUM (ANAPROX) 220 MG TABLET    Take 220 mg by mouth at bedtime.   ODEFSEY 200-25-25 MG TABS TABLET    TAKE 1 TABLET BY MOUTH EVERY DAY WITH BREAKFAST   OLMESARTAN-HYDROCHLOROTHIAZIDE (BENICAR HCT) 20-12.5 MG TABLET    TAKE 1 TABLET BY MOUTH DAILY.   OMEGA-3 ACID ETHYL ESTERS (LOVAZA) 1 G CAPSULE    TAKE 1 CAPSULE EVERY DAY   PRAVASTATIN (PRAVACHOL) 40 MG TABLET    Take 1 tablet (40 mg total) by mouth daily.   TRAMADOL (ULTRAM) 50 MG TABLET    Take one tablet by mouth three times daily as needed for pain  Modified Medications   No medications on file  Discontinued Medications   No medications on file     Physical Exam:  Vitals:   06/11/17 1450  BP: 118/74  Pulse: 67  Temp: 98.1 F (36.7 C)  TempSrc: Oral  SpO2: 98%  Weight: 131 lb  (59.4 kg)  Height: '5\' 5"'  (1.651 m)   Body mass index is 21.8 kg/m.  Physical Exam  Constitutional: She is oriented to person, place, and time. She appears well-developed and well-nourished.  HENT:  Head: Normocephalic and atraumatic.  Mouth/Throat: Oropharynx is clear and moist. No oropharyngeal exudate.  Eyes: Pupils are equal, round, and reactive to light. No scleral icterus.  Neck: Neck supple. Carotid bruit is not present. No tracheal deviation present.  Cardiovascular: Normal rate, regular rhythm and normal heart sounds.  Pulmonary/Chest: Effort normal and breath sounds normal. No stridor.  Abdominal: Soft. Normal appearance and bowel sounds are normal. She exhibits no distension and no mass. There is no hepatomegaly. There is no tenderness. There is no rigidity and no guarding.  Musculoskeletal: She exhibits tenderness. She exhibits no edema.  Lymphadenopathy:    She has no cervical adenopathy.  Neurological: She is alert and oriented to person, place, and time.  Skin: Skin is warm and dry. No rash noted.  Psychiatric: Her behavior is normal. Judgment and thought content normal. Her mood appears anxious.    Labs reviewed: Basic Metabolic Panel: Recent Labs    12/04/16 1527 12/22/16 0842 02/04/17 1627 04/29/17 0915  NA 138 143  --  140  K 4.1 4.0  --  3.8  CL 101 105  --  103  CO2 29 34*  --  31  GLUCOSE 105* 118*  --  93  BUN 11 14  --  13  CREATININE 0.91 1.05*  --  1.05*  CALCIUM 9.9 9.6 10.3 9.5  TSH  --   --  0.97  --    Liver Function Tests: Recent Labs    12/04/16 1527 12/22/16 0842 04/29/17 0915  AST '23 17 19  ' ALT '19 14 18  ' BILITOT 0.4 0.4 0.4  PROT 7.1 6.4 6.5   No results for input(s): LIPASE, AMYLASE in the last 8760 hours. No results for input(s): AMMONIA in the last 8760 hours. CBC: Recent Labs    12/22/16 0842 12/31/16 1039 01/08/17 1347  WBC 3.2* 3.0* 4.5  NEUTROABS 1,264* 1,236* 2,223  HGB 11.6* 11.2* 12.4  HCT 34.9* 33.7* 37.1    MCV 91.6 91.8 91.8  PLT 186 190 214   Lipid Panel: Recent Labs    09/22/16 0944 12/22/16 0842 04/29/17 0915  CHOL 310* 207* 208*  HDL 75 64 65  LDLCALC 218* 127* 125*  TRIG 68 72 85  CHOLHDL 4.1 3.2 3.2   TSH: Recent Labs    02/04/17 1627  TSH 0.97   A1C: Lab Results  Component Value Date   HGBA1C 5.6 11/19/2015     Assessment/Plan 1. Anxiety and depression -some improvement with Cymbalta without adverse effects, will increase to 60 mg by mouth daily at this time. - DULoxetine (CYMBALTA) 60 MG capsule; Take 1 capsule (60 mg total) by mouth daily.  Dispense: 90 capsule; Refill: 0 - BASIC METABOLIC PANEL WITH GFR  2. Chronic joint pain -improvement noted, will increase to 60 mg at this time to see if she could benefit further.  - DULoxetine (CYMBALTA) 60 MG capsule; Take 1 capsule (60 mg total) by mouth daily.  Dispense: 90 capsule; Refill: 0  Next appt: 4 months.  Carlos American. [place], [facility] [phone]

## 2017-06-12 LAB — COMPREHENSIVE METABOLIC PANEL
AG RATIO: 1.8 (calc) (ref 1.0–2.5)
ALKALINE PHOSPHATASE (APISO): 52 U/L (ref 33–130)
ALT: 20 U/L (ref 6–29)
AST: 20 U/L (ref 10–35)
Albumin: 4.5 g/dL (ref 3.6–5.1)
BUN: 10 mg/dL (ref 7–25)
CALCIUM: 9.7 mg/dL (ref 8.6–10.4)
CHLORIDE: 103 mmol/L (ref 98–110)
CO2: 30 mmol/L (ref 20–32)
Creat: 0.89 mg/dL (ref 0.50–0.99)
GLOBULIN: 2.5 g/dL (ref 1.9–3.7)
GLUCOSE: 87 mg/dL (ref 65–99)
Potassium: 4 mmol/L (ref 3.5–5.3)
Sodium: 142 mmol/L (ref 135–146)
TOTAL PROTEIN: 7 g/dL (ref 6.1–8.1)
Total Bilirubin: 0.5 mg/dL (ref 0.2–1.2)

## 2017-06-12 LAB — CBC
HEMATOCRIT: 37.6 % (ref 35.0–45.0)
HEMOGLOBIN: 12.6 g/dL (ref 11.7–15.5)
MCH: 31 pg (ref 27.0–33.0)
MCHC: 33.5 g/dL (ref 32.0–36.0)
MCV: 92.4 fL (ref 80.0–100.0)
MPV: 11.4 fL (ref 7.5–12.5)
Platelets: 209 10*3/uL (ref 140–400)
RBC: 4.07 10*6/uL (ref 3.80–5.10)
RDW: 12.7 % (ref 11.0–15.0)
WBC: 3.4 10*3/uL — AB (ref 3.8–10.8)

## 2017-06-12 LAB — LIPID PANEL
CHOLESTEROL: 236 mg/dL — AB (ref <200)
HDL: 70 mg/dL (ref >50)
LDL Cholesterol (Calc): 150 mg/dL (calc) — ABNORMAL HIGH
Non-HDL Cholesterol (Calc): 166 mg/dL (calc) — ABNORMAL HIGH (ref <130)
Total CHOL/HDL Ratio: 3.4 (calc) (ref <5.0)
Triglycerides: 64 mg/dL (ref <150)

## 2017-06-12 LAB — T-HELPER CELL (CD4) - (RCID CLINIC ONLY)
CD4 T CELL HELPER: 31 % — AB (ref 33–55)
CD4 T Cell Abs: 470 /uL (ref 400–2700)

## 2017-06-12 LAB — BASIC METABOLIC PANEL WITH GFR
BUN: 11 mg/dL (ref 7–25)
CO2: 29 mmol/L (ref 20–32)
Calcium: 9.4 mg/dL (ref 8.6–10.4)
Chloride: 102 mmol/L (ref 98–110)
Creat: 0.91 mg/dL (ref 0.50–0.99)
GFR, EST AFRICAN AMERICAN: 79 mL/min/{1.73_m2} (ref >=60)
GFR, EST NON AFRICAN AMERICAN: 68 mL/min/{1.73_m2} (ref >=60)
Glucose, Bld: 85 mg/dL (ref 65–139)
POTASSIUM: 3.7 mmol/L (ref 3.5–5.3)
Sodium: 141 mmol/L (ref 135–146)

## 2017-06-12 LAB — RPR: RPR Ser Ql: NONREACTIVE

## 2017-06-15 ENCOUNTER — Other Ambulatory Visit: Payer: Self-pay | Admitting: Infectious Diseases

## 2017-06-15 DIAGNOSIS — B20 Human immunodeficiency virus [HIV] disease: Secondary | ICD-10-CM

## 2017-06-16 LAB — HIV-1 RNA QUANT-NO REFLEX-BLD
HIV 1 RNA Quant: <20 copies/mL
HIV-1 RNA QUANT, LOG: NOT DETECTED {Log_copies}/mL

## 2017-06-18 ENCOUNTER — Other Ambulatory Visit: Payer: Self-pay | Admitting: Nurse Practitioner

## 2017-06-18 MED ORDER — TRAMADOL HCL 50 MG PO TABS: ORAL_TABLET | ORAL | 0 refills | Status: DC

## 2017-06-18 NOTE — Telephone Encounter (Signed)
A medication refill was received from pharmacy for tramadol 50 mg. Rx was called in to pharmacy after verifying last fill date, provider, and quantity on PMP [place] database.   

## 2017-06-25 ENCOUNTER — Ambulatory Visit (INDEPENDENT_AMBULATORY_CARE_PROVIDER_SITE_OTHER): Payer: Medicare Other | Admitting: Infectious Diseases

## 2017-06-25 ENCOUNTER — Encounter: Payer: Self-pay | Admitting: Infectious Diseases

## 2017-06-25 VITALS — BP 118/74 | HR 66 | Temp 98.6°F | Wt 136.0 lb

## 2017-06-25 DIAGNOSIS — K089 Disorder of teeth and supporting structures, unspecified: Secondary | ICD-10-CM

## 2017-06-25 DIAGNOSIS — E785 Hyperlipidemia, unspecified: Secondary | ICD-10-CM | POA: Diagnosis not present

## 2017-06-25 DIAGNOSIS — N63 Unspecified lump in unspecified breast: Secondary | ICD-10-CM

## 2017-06-25 DIAGNOSIS — B20 Human immunodeficiency virus [HIV] disease: Secondary | ICD-10-CM | POA: Diagnosis not present

## 2017-06-25 DIAGNOSIS — F331 Major depressive disorder, recurrent, moderate: Secondary | ICD-10-CM

## 2017-06-25 DIAGNOSIS — C73 Malignant neoplasm of thyroid gland: Secondary | ICD-10-CM

## 2017-06-25 DIAGNOSIS — Z113 Encounter for screening for infections with a predominantly sexual mode of transmission: Secondary | ICD-10-CM

## 2017-06-25 DIAGNOSIS — M1990 Unspecified osteoarthritis, unspecified site: Secondary | ICD-10-CM

## 2017-06-25 NOTE — Assessment & Plan Note (Signed)
She will continue on statin per her PCP.

## 2017-06-25 NOTE — Assessment & Plan Note (Signed)
She has been started on anti-depressant by PCP.  I offered to have her seen by counselor here.

## 2017-06-25 NOTE — Assessment & Plan Note (Signed)
Appreciate rheum f/u Appreciate hand eval.

## 2017-06-25 NOTE — Assessment & Plan Note (Signed)
She will continue to have endo f/u.

## 2017-06-25 NOTE — Assessment & Plan Note (Signed)
Continue her odefsy She will get mammo, pap by PCP, GYN She has gotten PCV, PNVX.  Her colon is due 2023.  Will see her back in 9 months.

## 2017-06-25 NOTE — Progress Notes (Signed)
   Subjective:    Patient ID: Laurie Gray, female    DOB: 05-14-1955, 62 y.o.   MRN: [id number]  HPI 62yo F with hx of dx 2000 HIV+, depression, hyperlipidemia, prev lap hysterecomy-BSO (2009). Previously took D4T cocktail, went to undetectable. Took herself off for 5 years. CD4 went under 200 and was restarted on atripla.  Has had f/u with GYN, nl PAP 05-2016. Has had previous abn on colpo (SIL).  She had mammo 02-2017 which showed R cysts. She has u/s f/u scheduled.  Had colon 2013. Has stool test (-) x 2 this year.   Previously changed to [facility] after she had been started on fosamax for low bone mineral density. She has since been off fosamax.   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  She also got radiocative Iodine. She had f/u with ENT 06-2016 for dry mouth.  Was seen by Endo 10-2016 and had her thyroid rx decreased due to TSH 0.165.  She was seen by Rheum, going to hand specialist.  Still on tramadol. Is trying to wean this down. Has been getting relief from this. Was started on naproxen but gave her n/v. Can take alleve.  Can't open a water bottle, jar. Signature is different.   Has had trouble with temp regulation, too hot, too cold.  Was started on cymbalta for "anxiety, depression, pain". Has been on for 2 months. "I think it is helping with my mood a little... I think it is helping with my sleep a little.. I have brain fog all the time".     HIV 1 RNA Quant (copies/mL)  Date Value  06/11/2017 <20 NOT DETECTED  12/04/2016 <20 NOT DETECTED  06/03/2016 <20 NOT DETECTED   CD4 T Cell Abs (/uL)  Date Value  06/11/2017 470  12/04/2016 630  06/03/2016 430    Review of Systems  Constitutional: Negative for appetite change, chills, fever and unexpected weight change.  Gastrointestinal: Negative for constipation and diarrhea.  Genitourinary: Negative for difficulty urinating.  Musculoskeletal: Positive for arthralgias.    Psychiatric/Behavioral: The patient is nervous/anxious.   her frozen shoulders are slightly more mobile. L > R Please see HPI. All other systems reviewed and negative.     Objective:   Physical Exam  Constitutional: She appears well-developed and well-nourished.  HENT:  Mouth/Throat: No oropharyngeal exudate.  Eyes: Pupils are equal, round, and reactive to light. Conjunctivae and EOM are normal.  Neck: Normal range of motion. Neck supple.  Cardiovascular: Normal rate, regular rhythm and normal heart sounds.  Pulmonary/Chest: Effort normal and breath sounds normal.  Abdominal: Soft. Bowel sounds are normal. She exhibits no distension. There is no tenderness.  Musculoskeletal: She exhibits no edema.       Arms: Lymphadenopathy:    She has no cervical adenopathy.  Psychiatric: Her mood appears anxious.          Assessment & Plan:

## 2017-06-25 NOTE — Assessment & Plan Note (Signed)
She has u/s f/u.

## 2017-06-25 NOTE — Assessment & Plan Note (Signed)
I asked her to f/u with dental about the gap in her crown.

## 2017-07-01 ENCOUNTER — Other Ambulatory Visit: Payer: Self-pay | Admitting: Nurse Practitioner

## 2017-07-18 ENCOUNTER — Other Ambulatory Visit: Payer: Self-pay | Admitting: Nurse Practitioner

## 2017-07-20 NOTE — Telephone Encounter (Signed)
A medication refill was received from pharmacy for tramadol 50 mg. Rx was called in to pharmacy after verifying last fill date, provider, and quantity on PMP [place] database.   

## 2017-08-03 ENCOUNTER — Ambulatory Visit: Payer: Medicare HMO | Admitting: Endocrinology

## 2017-08-06 ENCOUNTER — Ambulatory Visit (INDEPENDENT_AMBULATORY_CARE_PROVIDER_SITE_OTHER): Payer: Medicare Other | Admitting: Endocrinology

## 2017-08-06 ENCOUNTER — Encounter: Payer: Self-pay | Admitting: Endocrinology

## 2017-08-06 VITALS — BP 136/76 | HR 85 | Temp 98.9°F | Ht 65.0 in | Wt 128.2 lb

## 2017-08-06 DIAGNOSIS — C73 Malignant neoplasm of thyroid gland: Secondary | ICD-10-CM | POA: Diagnosis not present

## 2017-08-06 DIAGNOSIS — E039 Hypothyroidism, unspecified: Secondary | ICD-10-CM

## 2017-08-06 LAB — TSH: TSH: 0.07 u[IU]/mL — ABNORMAL LOW (ref 0.35–4.50)

## 2017-08-06 NOTE — Patient Instructions (Addendum)
blood tests are requested for you today.  We'll let you know about the results.  Let's recheck the ultrasound.  you will receive a phone call, about a day and time for an appointment.  Please come back for a follow-up appointment in 6 months.

## 2017-08-06 NOTE — Progress Notes (Signed)
Subjective:    Patient ID: Laurie Gray, female    DOB: January 22, 1955, 62 y.o.   MRN: [id number]  HPI Pt returns for f/u of differentiated thyroid cancer, with this chronology: stage-------- 1/18: thyroidectomy: PAPILLARY CARCINOMA, 0.9 CM. - FOCAL MICROSCOPIC EXTRATHYROIDAL EXTENSION PRESENT; FOCALLY INVOLVES RESECTION MARGIN 3/18: 108.6 mCi I-131 sodium iodide 3/18: RAI with thyrogen, 109 mCi 3/18: post-therapy scan: residual uptake in the thyroid bed. No evidence for distant metastatic disease. 1/19: TG=0.1 (Ab neg) She reports slight swelling at the ant neck, but no assoc pain She takes synthroid 88 mcg/d.   Past Medical History:  Diagnosis Date  . Anemia   . Anxiety    hx of-not on any meds  . Arthritis    "shoulders, arms, fingers, neck, back" (01/31/2016)  . Chronic cervical pain   . History of bronchitis   . History of colon polyps   . History of kidney stones   . HIV infection ([place]) dx'd 2000   takes odefsey  . Hyperlipidemia    takes Pravastatin daily  . Hypertension    takes Benicar HCT daily  . Hyperthyroidism   . Hypothyroidism   . Internal hemorrhoids   . Joint pain   . Migraine    "in the past" (01/31/2016)  . Pituitary adenoma ([place])   . Refusal of blood transfusions as patient is Jehovah's Witness    "but substitutes are fine". (01/31/2016)  . Thyroid cancer ([place]) 12/2015  . Upper back pain     Past Surgical History:  Procedure Laterality Date  . ABDOMINAL HYSTERECTOMY    . BREAST EXCISIONAL BIOPSY Left    no scar seen   . BUNIONECTOMY Right   . COLONOSCOPY    . CYSTOSCOPY    . HAMMER TOE SURGERY Left    "middle toe was broke; turned into hammertoe then repaired"  . HAMMER TOE SURGERY Bilateral    "both small toes"  . PARATHYROIDECTOMY Right 01/31/2016  . PARATHYROIDECTOMY Right 01/31/2016   Procedure: PARATHYROIDECTOMY ON RIGHT SIDE;  Surgeon: Izora Gala, MD;  Location: [facility];  Service: ENT;  Laterality: Right;  . SALPINGOOPHORECTOMY Bilateral    . THYROIDECTOMY N/A 01/31/2016   Procedure: TOTAL THYROIDECTOMY;  Surgeon: Izora Gala, MD;  Location: [facility];  Service: ENT;  Laterality: N/A;  . TOE SURGERY     bunion  . TONSILLECTOMY     as a child  . TOTAL THYROIDECTOMY  01/31/2016  . TUBAL LIGATION     30 yrs ago    Social History   Socioeconomic History  . Marital status: Widowed    Spouse name: Not on file  . Number of children: Not on file  . Years of education: Not on file  . Highest education level: Not on file  Occupational History  . Not on file  Social Needs  . Financial resource strain: Not hard at all  . Food insecurity:    Worry: Never true    Inability: Never true  . Transportation needs:    Medical: No    Non-medical: No  Tobacco Use  . Smoking status: Never Smoker  . Smokeless tobacco: Never Used  Substance and Sexual Activity  . Alcohol use: Yes    Alcohol/week: 0.0 oz    Comment: 01/31/2016 "might go out to eat and have a drink once/month; no more than that"  . Drug use: No  . Sexual activity: Not Currently    Partners: Male    Comment: declined condoms  Lifestyle  .  Physical activity:    Days per week: 0 days    Minutes per session: 0 min  . Stress: Only a little  Relationships  . Social connections:    Talks on phone: More than three times a week    Gets together: More than three times a week    Attends religious service: More than 4 times per year    Active member of club or organization: No    Attends meetings of clubs or organizations: Never    Relationship status: Widowed  . Intimate partner violence:    Fear of current or ex partner: No    Emotionally abused: No    Physically abused: No    Forced sexual activity: No  Other Topics Concern  . Not on file  Social History Narrative   Normal diet   Drinks Caffeine- coffee/coke/chocolate    Lives in an apartment/ third level/1 person/1 pet   Past profession- Museum/gallery exhibitions officer, cosmetologist   Exercises 3-4 x weekly (cardio and  weights)        Current Outpatient Medications on File Prior to Visit  Medication Sig Dispense Refill  . Cyanocobalamin (B-12 PO) Take 1 tablet by mouth daily.    . DULoxetine (CYMBALTA) 60 MG capsule Take 1 capsule (60 mg total) by mouth daily. 90 capsule 0  . glucosamine-chondroitin 500-400 MG tablet Take 1 tablet by mouth 2 (two) times daily.    . hydrocortisone (PROCTO-PAK) 1 % CREA Twice daily as needed for hemorrhoids 28.35 g 1  . levothyroxine (SYNTHROID, LEVOTHROID) 88 MCG tablet TAKE 1 TABLET BY MOUTH EVERY DAY 90 tablet 2  . Menthol-Camphor (ICY HOT ADVANCED RELIEF) 16-11 % CREA Apply 1 application topically 4 (four) times daily as needed (for shoulder pain).    . Multiple Vitamins-Minerals (MULTIVITAMIN ADULTS 50+ PO) Take 1 tablet by mouth daily.    . naproxen sodium (ANAPROX) 220 MG tablet Take 220 mg by mouth at bedtime.    . ODEFSEY 200-25-25 MG TABS tablet TAKE 1 TABLET BY MOUTH EVERY DAY WITH BREAKFAST 90 tablet 2  . olmesartan-hydrochlorothiazide (BENICAR HCT) 20-12.5 MG tablet TAKE 1 TABLET BY MOUTH DAILY. 90 tablet 0  . omega-3 acid ethyl esters (LOVAZA) 1 g capsule TAKE 1 CAPSULE EVERY DAY 90 capsule 1  . pravastatin (PRAVACHOL) 40 MG tablet Take 1 tablet (40 mg total) by mouth daily. 90 tablet 1  . traMADol (ULTRAM) 50 MG tablet TAKE 1 TABLET BY MOUTH THREE TIMES A DAY AS NEEDED FOR PAIN 90 tablet 0   No current facility-administered medications on file prior to visit.     Allergies  Allergen Reactions  . Crestor [Rosuvastatin Calcium]     myalgias  . Pravastatin     myalgias  . Sulfonamide Derivatives Swelling    SWELLING REACTION UNSPECIFIED     Family History  Problem Relation Age of Onset  . Breast cancer Mother 32  . Hypertension Mother   . Cancer Mother   . Hyperlipidemia Mother   . Stroke Mother   . Breast cancer Sister 29       BRCA+  . Osteoarthritis Maternal Grandmother   . Breast cancer Maternal Grandmother   . Colon cancer Neg Hx     BP  136/76 (BP Location: Right Arm, Patient Position: Sitting, Cuff Size: Normal)   Pulse 85   Temp 98.9 F (37.2 C) (Oral)   Ht _0  (1.651 m)   Wt 128 lb 3.2 oz (58.2 kg)   SpO2 97%  BMI 21.33 kg/m    Review of Systems Denies weight change.  She has dry skin.      Objective:   Physical Exam VITAL SIGNS:  See vs page GENERAL: no distress Neck: a healed scar is present.  I do not appreciate a nodule in the thyroid or elsewhere in the neck.        Assessment & Plan:  Thyroid cancer: recheck today postsurgical hypothyroidism: recheck today Neck swelling, new: reported by patient  Patient Instructions  blood tests are requested for you today.  We'll let you know about the results.  Let's recheck the ultrasound.  you will receive a phone call, about a day and time for an appointment.  Please come back for a follow-up appointment in 6 months.

## 2017-08-07 LAB — THYROGLOBULIN ANTIBODY

## 2017-08-07 LAB — THYROGLOBULIN LEVEL: Thyroglobulin: <0.1 ng/mL — ABNORMAL LOW

## 2017-08-10 ENCOUNTER — Other Ambulatory Visit: Payer: Self-pay | Admitting: Nurse Practitioner

## 2017-08-10 ENCOUNTER — Ambulatory Visit
Admission: RE | Admit: 2017-08-10 | Discharge: 2017-08-10 | Disposition: A | Discharge status: Home / Self Care | Payer: Medicare Other | Source: Ambulatory Visit | Attending: Nurse Practitioner | Admitting: Nurse Practitioner

## 2017-08-10 DIAGNOSIS — N6001 Solitary cyst of right breast: Secondary | ICD-10-CM

## 2017-08-10 DIAGNOSIS — N6310 Unspecified lump in the right breast, unspecified quadrant: Secondary | ICD-10-CM

## 2017-08-19 ENCOUNTER — Other Ambulatory Visit: Payer: Self-pay | Admitting: Nurse Practitioner

## 2017-08-20 NOTE — Telephone Encounter (Signed)
A medication refill was received from pharmacy for tramadol 50 mg. Rx was called in to pharmacy after verifying last fill date, provider, and quantity on PMP [place] database.   

## 2017-08-24 ENCOUNTER — Other Ambulatory Visit: Payer: Self-pay | Admitting: Nurse Practitioner

## 2017-08-24 DIAGNOSIS — F329 Major depressive disorder, single episode, unspecified: Secondary | ICD-10-CM

## 2017-08-24 DIAGNOSIS — F32A Depression, unspecified: Secondary | ICD-10-CM

## 2017-08-24 DIAGNOSIS — G8929 Other chronic pain: Secondary | ICD-10-CM

## 2017-08-24 DIAGNOSIS — M2550 Pain in unspecified joint: Secondary | ICD-10-CM

## 2017-08-24 DIAGNOSIS — F419 Anxiety disorder, unspecified: Principal | ICD-10-CM

## 2017-09-06 ENCOUNTER — Other Ambulatory Visit: Payer: Self-pay | Admitting: Nurse Practitioner

## 2017-09-10 ENCOUNTER — Other Ambulatory Visit: Payer: Self-pay | Admitting: Nurse Practitioner

## 2017-09-10 DIAGNOSIS — F32A Depression, unspecified: Secondary | ICD-10-CM

## 2017-09-10 DIAGNOSIS — G8929 Other chronic pain: Secondary | ICD-10-CM

## 2017-09-10 DIAGNOSIS — F329 Major depressive disorder, single episode, unspecified: Secondary | ICD-10-CM

## 2017-09-10 DIAGNOSIS — F419 Anxiety disorder, unspecified: Principal | ICD-10-CM

## 2017-09-10 DIAGNOSIS — M2550 Pain in unspecified joint: Secondary | ICD-10-CM

## 2017-09-18 ENCOUNTER — Other Ambulatory Visit: Payer: Self-pay | Admitting: Nurse Practitioner

## 2017-09-18 NOTE — Telephone Encounter (Signed)
A medication refill was received from pharmacy for tramadol 50 mg. Rx was called in to pharmacy after verifying last fill date, provider, and quantity on PMP [place] database.   

## 2017-10-11 ENCOUNTER — Other Ambulatory Visit: Payer: Self-pay | Admitting: Infectious Diseases

## 2017-10-11 DIAGNOSIS — E785 Hyperlipidemia, unspecified: Secondary | ICD-10-CM

## 2017-10-15 ENCOUNTER — Ambulatory Visit (INDEPENDENT_AMBULATORY_CARE_PROVIDER_SITE_OTHER): Payer: Medicare Other | Admitting: Nurse Practitioner

## 2017-10-15 ENCOUNTER — Encounter: Payer: Self-pay | Admitting: Nurse Practitioner

## 2017-10-15 VITALS — BP 124/84 | HR 61 | Temp 98.5°F | Ht 65.0 in | Wt 131.0 lb

## 2017-10-15 DIAGNOSIS — F419 Anxiety disorder, unspecified: Secondary | ICD-10-CM | POA: Diagnosis not present

## 2017-10-15 DIAGNOSIS — E7801 Familial hypercholesterolemia: Secondary | ICD-10-CM | POA: Diagnosis not present

## 2017-10-15 DIAGNOSIS — M8580 Other specified disorders of bone density and structure, unspecified site: Secondary | ICD-10-CM | POA: Diagnosis not present

## 2017-10-15 DIAGNOSIS — G8929 Other chronic pain: Secondary | ICD-10-CM

## 2017-10-15 DIAGNOSIS — Z803 Family history of malignant neoplasm of breast: Secondary | ICD-10-CM | POA: Diagnosis not present

## 2017-10-15 DIAGNOSIS — M2550 Pain in unspecified joint: Secondary | ICD-10-CM

## 2017-10-15 DIAGNOSIS — F32A Depression, unspecified: Secondary | ICD-10-CM

## 2017-10-15 DIAGNOSIS — F329 Major depressive disorder, single episode, unspecified: Secondary | ICD-10-CM

## 2017-10-15 NOTE — Patient Instructions (Addendum)
Recommended to take caltrate with D 600/400 twice a day and weight bearing activity   Increase activity.

## 2017-10-15 NOTE — Progress Notes (Signed)
Careteam: Patient Care Team: Lauree Chandler, NP as PCP - General (Geriatric Medicine) Campbell Riches, MD as PCP - Infectious Diseases (Infectious Diseases) Jackolyn Confer, MD as Consulting Physician (General Surgery)  Advanced Directive information Does Patient Have a Medical Advance Directive?: No  Allergies  Allergen Reactions  . Crestor [Rosuvastatin Calcium]     myalgias  . Pravastatin     myalgias  . Sulfonamide Derivatives Swelling    SWELLING REACTION UNSPECIFIED     Chief Complaint  Patient presents with  . Medical Management of Chronic Issues    pt is being seen for a 4 month routine visit.      HPI: Patient is a 62 y.o. female seen in the office today for routine follow up.  Her mom pasted away in august and she was her caregiver.  Pt with hx of HIV, thyroid cancer s/p thyroidectomy, anxiety and depression, chronic joint pain, hyperlipidemia.  Mother died breast cancer and sister is going through treatment of breast cancer and now her mammogram/breast US is being followed due to probable benign mass.   Missed follow up with ophthalmology due to grieving. She also missed US thyroid which she is planning on rescheduling.   She plans to make appt with GYN, recent GYN retired. Hysterectomy due to fibroids.   Rheumatology ruled out rheumatoid arthritis. Hands are stiff and painful. Therapy did not help.   Would like follow up Dexa scan which is done by GYN.   HIV- following with Dr Johnnye Sima- viral load undetectable.   Anxiety- continues on Cymbalta 60 mg by mouth daily   Chronic pain- using tramadol and Cymbalta which has been beneficial.   Review of Systems:  Review of Systems  Constitutional: Positive for malaise/fatigue. Negative for chills and fever.  Respiratory: Negative for cough, sputum production and shortness of breath.   Cardiovascular: Negative for chest pain, palpitations and leg swelling.  Gastrointestinal: Negative for abdominal  pain, constipation, diarrhea and heartburn.  Genitourinary: Negative for dysuria, frequency and urgency.  Musculoskeletal: Positive for joint pain and myalgias. Negative for back pain and falls.       Using less pain medication  Skin: Negative.   Neurological: Positive for weakness. Negative for dizziness, tingling and headaches.  Psychiatric/Behavioral: Positive for depression. Negative for memory loss. The patient is nervous/anxious and has insomnia.     Past Medical History:  Diagnosis Date  . Anemia   . Anxiety    hx of-not on any meds  . Arthritis    "shoulders, arms, fingers, neck, back" (01/31/2016)  . Chronic cervical pain   . History of bronchitis   . History of colon polyps   . History of kidney stones   . HIV infection ([place]) dx'd 2000   takes odefsey  . Hyperlipidemia    takes Pravastatin daily  . Hypertension    takes Benicar HCT daily  . Hyperthyroidism   . Hypothyroidism   . Internal hemorrhoids   . Joint pain   . Migraine    "in the past" (01/31/2016)  . Pituitary adenoma ([place])   . Refusal of blood transfusions as patient is Jehovah's Witness    "but substitutes are fine". (01/31/2016)  . Thyroid cancer ([place]) 12/2015  . Upper back pain    Past Surgical History:  Procedure Laterality Date  . ABDOMINAL HYSTERECTOMY    . BREAST EXCISIONAL BIOPSY Left    no scar seen   . BUNIONECTOMY Right   . COLONOSCOPY    .  CYSTOSCOPY    . HAMMER TOE SURGERY Left    "middle toe was broke; turned into hammertoe then repaired"  . HAMMER TOE SURGERY Bilateral    "both small toes"  . PARATHYROIDECTOMY Right 01/31/2016  . PARATHYROIDECTOMY Right 01/31/2016   Procedure: PARATHYROIDECTOMY ON RIGHT SIDE;  Surgeon: Izora Gala, MD;  Location: [facility];  Service: ENT;  Laterality: Right;  . SALPINGOOPHORECTOMY Bilateral   . THYROIDECTOMY N/A 01/31/2016   Procedure: TOTAL THYROIDECTOMY;  Surgeon: Izora Gala, MD;  Location: [facility];  Service: ENT;  Laterality: N/A;  . TOE SURGERY      bunion  . TONSILLECTOMY     as a child  . TOTAL THYROIDECTOMY  01/31/2016  . TUBAL LIGATION     30 yrs ago   Social History:   reports that she has never smoked. She has never used smokeless tobacco. She reports that she drinks alcohol. She reports that she does not use drugs.  Family History  Problem Relation Age of Onset  . Breast cancer Mother 61  . Hypertension Mother   . Cancer Mother   . Hyperlipidemia Mother   . Stroke Mother   . Breast cancer Sister 78       BRCA+  . Osteoarthritis Maternal Grandmother   . Breast cancer Maternal Grandmother   . Colon cancer Neg Hx     Medications: Patient's Medications  New Prescriptions   No medications on file  Previous Medications   CYANOCOBALAMIN (B-12 PO)    Take 1 tablet by mouth daily.   DULOXETINE (CYMBALTA) 60 MG CAPSULE    TAKE 1 CAPSULE BY MOUTH EVERY DAY   GLUCOSAMINE-CHONDROITIN 500-400 MG TABLET    Take 1 tablet by mouth 2 (two) times daily.   HYDROCORTISONE (PROCTO-PAK) 1 % CREA    Twice daily as needed for hemorrhoids   LEVOTHYROXINE (SYNTHROID, LEVOTHROID) 88 MCG TABLET    TAKE 1 TABLET BY MOUTH EVERY DAY   MENTHOL-CAMPHOR (ICY HOT ADVANCED RELIEF) 16-11 % CREA    Apply 1 application topically 4 (four) times daily as needed (for shoulder pain).   MULTIPLE VITAMINS-MINERALS (MULTIVITAMIN ADULTS 50+ PO)    Take 1 tablet by mouth daily.   NAPROXEN SODIUM (ANAPROX) 220 MG TABLET    Take 220 mg by mouth at bedtime.   ODEFSEY 200-25-25 MG TABS TABLET    TAKE 1 TABLET BY MOUTH EVERY DAY WITH BREAKFAST   OLMESARTAN-HYDROCHLOROTHIAZIDE (BENICAR HCT) 20-12.5 MG TABLET    TAKE 1 TABLET BY MOUTH DAILY.   OMEGA-3 ACID ETHYL ESTERS (LOVAZA) 1 G CAPSULE    TAKE 1 CAPSULE EVERY DAY   PRAVASTATIN (PRAVACHOL) 40 MG TABLET    Take 1 tablet (40 mg total) by mouth daily.   TRAMADOL (ULTRAM) 50 MG TABLET    TAKE 1 TABLET BY MOUTH THREE TIMES A DAY AS NEEDED FOR PAIN  Modified Medications   No medications on file  Discontinued  Medications   No medications on file     Physical Exam:  Vitals:   10/15/17 1414  BP: 124/84  Pulse: 61  Temp: 98.5 F (36.9 C)  TempSrc: Oral  SpO2: 97%  Weight: 131 lb (59.4 kg)  Height: _0  (1.651 m)   Body mass index is 21.8 kg/m.  Physical Exam  Constitutional: She is oriented to person, place, and time. She appears well-developed and well-nourished.  HENT:  Head: Normocephalic and atraumatic.  Mouth/Throat: Oropharynx is clear and moist. No oropharyngeal exudate.  Eyes: Pupils are equal, round,  and reactive to light. No scleral icterus.  Neck: Neck supple. Carotid bruit is not present. No tracheal deviation present.  Cardiovascular: Normal rate, regular rhythm and normal heart sounds.  Pulmonary/Chest: Effort normal and breath sounds normal. No stridor.  Abdominal: Soft. Normal appearance and bowel sounds are normal. She exhibits no distension and no mass. There is no hepatomegaly. There is no tenderness. There is no rigidity and no guarding.  Musculoskeletal: She exhibits tenderness. She exhibits no edema.  Lymphadenopathy:    She has no cervical adenopathy.  Neurological: She is alert and oriented to person, place, and time. She exhibits abnormal muscle tone.  Skin: Skin is warm and dry. No rash noted.  Psychiatric: Her behavior is normal. Judgment and thought content normal. Her mood appears anxious.    Labs reviewed: Basic Metabolic Panel: Recent Labs    02/04/17 1627 04/29/17 0915 06/11/17 1038 06/11/17 1514 08/06/17 1431  NA  --  140 142 141  --   K  --  3.8 4.0 3.7  --   CL  --  103 103 102  --   CO2  --  _0 --   GLUCOSE  --  93 87 85  --   BUN  --  _1 --   CREATININE  --  1.05* 0.89 0.91  --   CALCIUM 10.3 9.5 9.7 9.4  --   TSH 0.97  --   --   --  0.07*   Liver Function Tests: Recent Labs    12/22/16 0842 04/29/17 0915 06/11/17 1038  AST _2 ALT _3 BILITOT 0.4 0.4 0.5  PROT 6.4 6.5 7.0   No results for  input(s): LIPASE, AMYLASE in the last 8760 hours. No results for input(s): AMMONIA in the last 8760 hours. CBC: Recent Labs    12/22/16 0842 12/31/16 1039 01/08/17 1347 06/11/17 1038  WBC 3.2* 3.0* 4.5 3.4*  NEUTROABS 1,264* 1,236* 2,223  --   HGB 11.6* 11.2* 12.4 12.6  HCT 34.9* 33.7* 37.1 37.6  MCV 91.6 91.8 91.8 92.4  PLT 186 190 214 209   Lipid Panel: Recent Labs    12/22/16 0842 04/29/17 0915 06/11/17 1038  CHOL 207* 208* 236*  HDL 64 65 70  LDLCALC 127* 125* 150*  TRIG 72 85 64  CHOLHDL 3.2 3.2 3.4   TSH: Recent Labs    02/04/17 1627 08/06/17 1431  TSH 0.97 0.07*   A1C: Lab Results  Component Value Date   HGBA1C 5.6 11/19/2015     Assessment/Plan 1. Osteopenia, unspecified location Recommended to take caltrate with D 600/400 twice a day with weight bearing activity.   2. Family history of breast cancer - Ambulatory referral to Genetics  3. Anxiety and depression Now dealing with grief after the lost of her mother. Recommended counselling which she has done. Continues on cymbalta no thoughts of SI or HI.   4. Familial hypercholesterolemia -LDL elevated on last labs, statin intolerant but reports she is taking pravastatin. To continue this at this time with dietary modifications.   5. Chronic joint pain Unchanged, following with rheumatology. Using ultram PRN pain with cymbalta which has been beneficial.   Next appt: 6 months, sooner if needed Khalel Alms K. [place], [facility] [phone]

## 2017-10-16 ENCOUNTER — Other Ambulatory Visit: Payer: Self-pay | Admitting: Nurse Practitioner

## 2017-10-22 ENCOUNTER — Telehealth: Payer: Self-pay | Admitting: Genetics

## 2017-10-22 ENCOUNTER — Encounter: Payer: Self-pay | Admitting: Genetics

## 2017-10-22 NOTE — Telephone Encounter (Signed)
A genetic counseling appt has been scheduled to see Laurie Gray on 11/7 at 2pm. Pt aware to arrive 15 minutes early. Letter mailed.

## 2017-11-12 ENCOUNTER — Inpatient Hospital Stay: Payer: Medicare Other | Attending: Genetic Counselor | Admitting: Genetics

## 2017-11-12 ENCOUNTER — Encounter: Payer: Self-pay | Admitting: Genetics

## 2017-11-12 ENCOUNTER — Inpatient Hospital Stay: Payer: Medicare Other

## 2017-11-12 DIAGNOSIS — Z803 Family history of malignant neoplasm of breast: Secondary | ICD-10-CM | POA: Diagnosis not present

## 2017-11-12 DIAGNOSIS — D352 Benign neoplasm of pituitary gland: Secondary | ICD-10-CM | POA: Diagnosis not present

## 2017-11-12 DIAGNOSIS — C73 Malignant neoplasm of thyroid gland: Secondary | ICD-10-CM

## 2017-11-12 DIAGNOSIS — D351 Benign neoplasm of parathyroid gland: Secondary | ICD-10-CM | POA: Insufficient documentation

## 2017-11-12 DIAGNOSIS — D353 Benign neoplasm of craniopharyngeal duct: Secondary | ICD-10-CM

## 2017-11-12 NOTE — Progress Notes (Addendum)
REFERRING PROVIDER: Lauree Chandler, NP [place], [place] [address]  PRIMARY PROVIDER:  Lauree Chandler, NP  PRIMARY REASON FOR VISIT:  1. PITUITARY ADENOMA, BENIGN   2. Parathyroid adenoma   3. Papillary carcinoma of thyroid (Laurie Gray)   4. Family history of breast cancer     HISTORY OF PRESENT ILLNESS:   Laurie Gray, a 62 y.o. female, was seen for a [employer] cancer genetics consultation at the request of Dr. Dewaine Gray due to a personal and family history of cancer.  Laurie Gray presents to clinic today to discuss the possibility of a hereditary predisposition to cancer, genetic testing, and to further clarify her future cancer risks, as well as potential cancer risks for family members.   In 2018, at the age of 24, Laurie Gray was diagnosed with papillary thyroid cancer.  She also had some parathyroid adenomas removed (hx hyperparathyroidism).  She reports a distant history of a very small pituitary adenoma.     HORMONAL RISK FACTORS:  Menarche was at age 10/12.  First live birth at age 56.  Ovaries intact: no. BSO 2009 Hysterectomy: yes.  Menopausal status: postmenopausal.  HRT use: a few months Colonoscopy: yes; normal. Mammogram within the last year: yes. Number of breast biopsies: 0.  There is one area they are monitoring with ultrasound.   Past Medical History:  Diagnosis Date  . Anemia   . Anxiety    hx of-not on any meds  . Arthritis    "shoulders, arms, fingers, neck, back" (01/31/2016)  . Chronic cervical pain   . Family history of breast cancer   . History of bronchitis   . History of colon polyps   . History of kidney stones   . HIV infection ([place]) dx'd 2000   takes odefsey  . Hyperlipidemia    takes Pravastatin daily  . Hypertension    takes Benicar HCT daily  . Hyperthyroidism   . Hypothyroidism   . Internal hemorrhoids   . Joint pain   . Migraine    "in the past" (01/31/2016)  . Parathyroid adenoma   . Pituitary adenoma ([place])   .  Refusal of blood transfusions as patient is Jehovah's Witness    "but substitutes are fine". (01/31/2016)  . Thyroid cancer ([place]) 12/2015  . Upper back pain     Past Surgical History:  Procedure Laterality Date  . ABDOMINAL HYSTERECTOMY    . BREAST EXCISIONAL BIOPSY Left    no scar seen   . BUNIONECTOMY Right   . COLONOSCOPY    . CYSTOSCOPY    . HAMMER TOE SURGERY Left    "middle toe was broke; turned into hammertoe then repaired"  . HAMMER TOE SURGERY Bilateral    "both small toes"  . PARATHYROIDECTOMY Right 01/31/2016  . PARATHYROIDECTOMY Right 01/31/2016   Procedure: PARATHYROIDECTOMY ON RIGHT SIDE;  Surgeon: Laurie Gala, MD;  Location: [facility];  Service: ENT;  Laterality: Right;  . SALPINGOOPHORECTOMY Bilateral   . THYROIDECTOMY N/A 01/31/2016   Procedure: TOTAL THYROIDECTOMY;  Surgeon: Laurie Gala, MD;  Location: [facility];  Service: ENT;  Laterality: N/A;  . TOE SURGERY     bunion  . TONSILLECTOMY     as a child  . TOTAL THYROIDECTOMY  01/31/2016  . TUBAL LIGATION     30 yrs ago    Social History   Socioeconomic History  . Marital status: Widowed    Spouse name: Not on file  . Number of children: Not on  file  . Years of education: Not on file  . Highest education level: Not on file  Occupational History  . Not on file  Social Needs  . Financial resource strain: Not hard at all  . Food insecurity:    Worry: Never true    Inability: Never true  . Transportation needs:    Medical: No    Non-medical: No  Tobacco Use  . Smoking status: Never Smoker  . Smokeless tobacco: Never Used  Substance and Sexual Activity  . Alcohol use: Yes    Alcohol/week: 0.0 standard drinks    Comment: 01/31/2016 "might go out to eat and have a drink once/month; no more than that"  . Drug use: No  . Sexual activity: Not Currently    Partners: Male    Comment: declined condoms  Lifestyle  . Physical activity:    Days per week: 0 days    Minutes per session: 0 min  . Stress: Only a  little  Relationships  . Social connections:    Talks on phone: More than three times a week    Gets together: More than three times a week    Attends religious service: More than 4 times per year    Active member of club or organization: No    Attends meetings of clubs or organizations: Never    Relationship status: Widowed  Other Topics Concern  . Not on file  Social History Narrative   Normal diet   Drinks Caffeine- coffee/coke/chocolate    Lives in an apartment/ third level/1 person/1 pet   Past profession- Museum/gallery exhibitions officer, cosmetologist   Exercises 3-4 x weekly (cardio and weights)         FAMILY HISTORY:  We obtained a detailed, 4-generation family history.  Significant diagnoses are listed below: Family History  Problem Relation Age of Onset  . Breast cancer Mother 16  . Hypertension Mother   . Cancer Mother   . Hyperlipidemia Mother   . Stroke Mother   . Other Father        'vocal dysautonomia'  . Breast cancer Sister 64  . Osteoarthritis Maternal Grandmother   . Colon cancer Neg Hx     Laurie Gray has a 5 year-old son and a 6 year-old daughter with no hx of cancer.  She has 4 biological grandchildren. Laurie Gray has a sister, Laurie Gray, who has just finished breast cancer treatment (dx 2).  She had genetic testing that revealed some findings, Laurie Gray did not know much about her result.  We reached out to her sister, Laurie Gray, to see if we can get permission to access her results and discuss them with Laurie Gray.  Laurie Gray father: vocal dysautonomia, no other info known Paternal Aunts/Uncles: unk Paternal cousins: unk Paternal grandfather: unk Paternal grandmother:unk  Laurie Gray mother: dx with breast cancer at 71, died at 85 (recently) Maternal Aunts/Uncles: 3 maternal uncles died young, no known hx of cancer. 6 maternal aunts died in their 55's/90's no known hx of cancer.  Not a lot of info known about this side of the family. Maternal  cousins:  Unk, knows of 37 female cousin who had cancer, no other details known Maternal grandfather: unk Maternal grandmother:unk  Patient's maternal ancestors are of African Teaching laboratory technician American ([place]) descent, and paternal ancestors are of African American descent. There is no reported Ashkenazi Jewish ancestry. There is no known consanguinity.  GENETIC COUNSELING ASSESSMENT: Laurie Gray is a 62 y.o. female with a personal and  family history which is somewhat suggestive of a Hereditary Cancer Predisposition Syndrome. We, therefore, discussed and recommended the following at today's visit.   DISCUSSION: We reviewed the characteristics, features and inheritance patterns of hereditary cancer syndromes. We also discussed genetic testing, including the appropriate family members to test, the process of testing, insurance coverage and turn-around-time for results. We discussed the implications of a negative, positive and/or variant of uncertain significant result. We recommended Laurie Gray pursue genetic testing for the Multi-Cancer gene panel.   The Multi-Cancer Panel offered by [facility] includes sequencing and/or deletion duplication testing of the following 91 genes: AIP, ALK, APC, ATM, AXIN2, BAP1, BARD1, BLM, BMPR1A, BRCA1, BRCA2, BRIP1, BUB1B, CASR, CDC73, CDH1, CDK4, CDKN1B, CDKN1C, CDKN2A, CEBPA, CEP57, CHEK2, CTNNA1, DICER1, DIS3L2, EGFR, ENG, EPCAM, FH, FLCN, GALNT12, GATA2, GPC3, GREM1, HOXB13, HRAS, KIT, MAX, MEN1, MET, MITF, MLH1, MLH3, MSH2, MSH3, MSH6, MUTYH, NBN, NF1, NF2, NTHL1, PALB2, PDGFRA, PHOX2B, PMS2, POLD1, POLE, POT1, PRKAR1A, PTCH1, PTEN, RAD50, RAD51C, RAD51D, RB1, RECQL4, RET, RNF43, RPS20, RUNX1, SDHA, SDHAF2, SDHB, SDHC, SDHD, SMAD4, SMARCA4, SMARCB1, SMARCE1, STK11, SUFU, TERC, TERT, TMEM127, TP53, TSC1, TSC2, VHL, WRN, WT1  We discussed that only 5-10% of cancers are associated with a Hereditary cancer predisposition syndrome.  One of the most common hereditary cancer  syndromes that increases breast cancer risk is called Hereditary Breast and Ovarian Cancer (HBOC) syndrome.  This syndrome is caused by mutations in the BRCA1 and BRCA2 genes.  This syndrome increases an individual's lifetime risk to develop breast, ovarian, pancreatic, and other types of cancer.  There are also many other cancer predisposition syndromes caused by mutations in several other genes.  Based on her history of pituitary adenoma, parathyroid adenoma, and thyroid cancer, we also discussed MEN.   One major syndrome that increases the risk for these types of tumors is called Multiple endocrine neoplasia (MEN).  There are 4 different subtypes of this syndrome, and is caused by mutations in the MEN1, RET, and CDKN1B genes.   Multiple endocrine neoplasia type 1 in particular causes an increased risk for both cancerous and non-cancerous tumors in the parathyroid gland, the pituitary gland, and pancreas.  It also causes increased risk for carcinoid tumors and hyperparathyroidism.  There is some evidence suggesting that there might be an increased risk for thyroid growths such as colloid goiters, adenomas, and carcinomas due to MEN type 1. We discussed that there are several other cancer predisposition syndromes caused by mutations in several other genes that cause increased risk for neuroendocrine cancers, brain cancers, and several other cancer types.    We discussed that if she is found to have a mutation in one of these genes, it may impact future medical management recommendations such as increased cancer screenings and consideration of risk reducing surgeries.  A positive result could also have implications for the patient's family members.  A Negative result would mean we were unable to identify a hereditary component to her cancer, but does not rule out the possibility of a hereditary basis for her/her family's cancer.  There could be mutations that are undetectable by current technology, or in  genes not yet tested or identified to increase cancer risk.    We discussed the potential to find a Variant of Uncertain Significance or VUS.  These are variants that have not yet been identified as pathogenic or benign, and it is unknown if this variant is associated with increased cancer risk or if this is a normal finding.  Most VUS's are reclassified to  benign or likely benign.   It should not be used to make medical management decisions. With time, we suspect the lab will determine the significance of any VUS's identified if any.   Based on Laurie Gray personal and family history of cancer, she meets medical criteria for genetic testing. Despite that she meets criteria, she may still have an out of pocket cost. The laboratory can provide her with an estimate of her OOP cost.  she was given the contact information for the laboratory if she has further questions.   Based on the patient's personal and family history, the statistical model (Tyrer Cusik)   Was used to estimate her risk of developing breast cancer. This estimates her lifetime risk of developing breast cancer to be approximately 15.7%. This estimation is performed in the setting negative genetic test results.  A positive result may significantly impact this risk assessment.  The patient's lifetime breast cancer risk is a preliminary estimate based on available information using one of several models endorsed by the [place] (ACS). The ACS recommends consideration of breast MRI screening as an adjunct to mammography for patients at high risk (defined as 20% or greater lifetime risk). A more detailed breast cancer risk assessment can be considered, if clinically indicated.     PLAN: After considering the risks, benefits, and limitations, Ms. Moroney  provided informed consent to pursue genetic testing and the blood sample was sent to [facility] for analysis of the Multi-Cancer Panel. Results should be available within  approximately 2-3 weeks' time, at which point they will be disclosed by telephone to Laurie Gray, as will any additional recommendations warranted by these results. Laurie Gray will receive a summary of her genetic counseling visit and a copy of her results once available. This information will also be available in Epic. We encouraged Laurie Gray to remain in contact with cancer genetics annually so that we can continuously update the family history and inform her of any changes in cancer genetics and testing that may be of benefit for her family. Laurie Gray questions were answered to her satisfaction today. Our contact information was provided should additional questions or concerns arise.  Based on Laurie Gray family history, we recommended her maternal relatives also, have genetic counseling and testing. Laurie Gray will let us know if we can be of any assistance in coordinating genetic counseling and/or testing for this family member.   Lastly, we encouraged Ms. Oddo to remain in contact with cancer genetics annually so that we can continuously update the family history and inform her of any changes in cancer genetics and testing that may be of benefit for this family.   Ms.  Knickerbocker questions were answered to her satisfaction today. Our contact information was provided should additional questions or concerns arise. Thank you for the referral and allowing [place] to share in the care of your patient.   Tana Felts, MS, [facility] Certified Genetic Counselor [email or web address] phone: [phone]  The patient was seen for a total of 40 minutes in face-to-face genetic counseling. This patient was discussed with Drs. Magrinat, Lindi Adie and/or Burr Medico who agrees with the above.

## 2017-11-19 ENCOUNTER — Other Ambulatory Visit: Payer: Self-pay | Admitting: Nurse Practitioner

## 2017-11-19 NOTE — Telephone Encounter (Signed)
A medication refill was received from pharmacy for tramadol 50 mg. Rx was called in to pharmacy after verifying last fill date, provider, and quantity on PMP [place] database.   

## 2017-12-02 ENCOUNTER — Telehealth: Payer: Self-pay | Admitting: Genetics

## 2017-12-05 ENCOUNTER — Other Ambulatory Visit: Payer: Self-pay | Admitting: Nurse Practitioner

## 2017-12-05 DIAGNOSIS — G8929 Other chronic pain: Secondary | ICD-10-CM

## 2017-12-05 DIAGNOSIS — F329 Major depressive disorder, single episode, unspecified: Secondary | ICD-10-CM

## 2017-12-05 DIAGNOSIS — M2550 Pain in unspecified joint: Secondary | ICD-10-CM

## 2017-12-05 DIAGNOSIS — F32A Depression, unspecified: Secondary | ICD-10-CM

## 2017-12-05 DIAGNOSIS — F419 Anxiety disorder, unspecified: Principal | ICD-10-CM

## 2017-12-10 ENCOUNTER — Other Ambulatory Visit: Payer: Medicare Other

## 2017-12-10 ENCOUNTER — Other Ambulatory Visit (HOSPITAL_COMMUNITY)
Admission: RE | Admit: 2017-12-10 | Discharge: 2017-12-10 | Disposition: A | Discharge status: Home / Self Care | Payer: Medicare Other | Source: Ambulatory Visit | Attending: Infectious Diseases | Admitting: Infectious Diseases

## 2017-12-10 DIAGNOSIS — E785 Hyperlipidemia, unspecified: Secondary | ICD-10-CM

## 2017-12-10 DIAGNOSIS — Z113 Encounter for screening for infections with a predominantly sexual mode of transmission: Secondary | ICD-10-CM | POA: Diagnosis present

## 2017-12-10 DIAGNOSIS — B20 Human immunodeficiency virus [HIV] disease: Secondary | ICD-10-CM

## 2017-12-11 LAB — T-HELPER CELL (CD4) - (RCID CLINIC ONLY)
CD4 % Helper T Cell: 38 % (ref 33–55)
CD4 T Cell Abs: 460 /uL (ref 400–2700)

## 2017-12-11 LAB — URINE CYTOLOGY ANCILLARY ONLY
Chlamydia: NEGATIVE
NEISSERIA GONORRHEA: NEGATIVE

## 2017-12-14 ENCOUNTER — Other Ambulatory Visit: Payer: Self-pay | Admitting: *Deleted

## 2017-12-14 LAB — LIPID PANEL
Cholesterol: 230 mg/dL — ABNORMAL HIGH (ref <200)
HDL: 70 mg/dL (ref >50)
LDL Cholesterol (Calc): 135 mg/dL (calc) — ABNORMAL HIGH
NON-HDL CHOLESTEROL (CALC): 160 mg/dL — AB (ref <130)
TRIGLYCERIDES: 123 mg/dL (ref <150)
Total CHOL/HDL Ratio: 3.3 (calc) (ref <5.0)

## 2017-12-14 LAB — COMPREHENSIVE METABOLIC PANEL
AG RATIO: 2 (calc) (ref 1.0–2.5)
ALT: 18 U/L (ref 6–29)
AST: 23 U/L (ref 10–35)
Albumin: 4.5 g/dL (ref 3.6–5.1)
Alkaline phosphatase (APISO): 51 U/L (ref 33–130)
BILIRUBIN TOTAL: 0.3 mg/dL (ref 0.2–1.2)
BUN / CREAT RATIO: 16 (calc) (ref 6–22)
BUN: 18 mg/dL (ref 7–25)
CO2: 32 mmol/L (ref 20–32)
Calcium: 10.2 mg/dL (ref 8.6–10.4)
Chloride: 105 mmol/L (ref 98–110)
Creat: 1.13 mg/dL — ABNORMAL HIGH (ref 0.50–0.99)
GLOBULIN: 2.2 g/dL (ref 1.9–3.7)
GLUCOSE: 94 mg/dL (ref 65–99)
POTASSIUM: 4.1 mmol/L (ref 3.5–5.3)
SODIUM: 144 mmol/L (ref 135–146)
TOTAL PROTEIN: 6.7 g/dL (ref 6.1–8.1)

## 2017-12-14 LAB — CBC
HCT: 35.9 % (ref 35.0–45.0)
Hemoglobin: 12.1 g/dL (ref 11.7–15.5)
MCH: 30.8 pg (ref 27.0–33.0)
MCHC: 33.7 g/dL (ref 32.0–36.0)
MCV: 91.3 fL (ref 80.0–100.0)
MPV: 11.7 fL (ref 7.5–12.5)
PLATELETS: 213 10*3/uL (ref 140–400)
RBC: 3.93 10*6/uL (ref 3.80–5.10)
RDW: 12.9 % (ref 11.0–15.0)
WBC: 3.8 10*3/uL (ref 3.8–10.8)

## 2017-12-14 LAB — RPR: RPR: NONREACTIVE

## 2017-12-14 LAB — HIV-1 RNA QUANT-NO REFLEX-BLD
HIV 1 RNA QUANT: NOT DETECTED {copies}/mL
HIV-1 RNA Quant, Log: <1.3 Log copies/mL

## 2017-12-14 MED ORDER — PRAVASTATIN SODIUM 40 MG PO TABS: 40.0000 mg | ORAL_TABLET | Freq: Every day | ORAL | 1 refills | Status: DC

## 2017-12-14 NOTE — Telephone Encounter (Signed)
[facility] [address]  [employer] [facility] and sent to [place] for approval due to [place].

## 2017-12-18 ENCOUNTER — Other Ambulatory Visit: Payer: Self-pay | Admitting: Nurse Practitioner

## 2017-12-18 NOTE — Telephone Encounter (Signed)
Revealed negative genetic testing.   We discussed that a negative result means we did not identify a genetic predisposition to cancer in her genetic testing.    While this is good news, we did discuss that genetic testing is not perfect and given her personal history of thyroid cancer, pituitary adenoma and parathyroid adenoma, she likely may still have enough personal history to have a clinical diagnosis of MEN even though her genetics were negative.  We will forward results to her endocrinologist who can follow-up.  We discussed it is also important she continue to to have annual mammograms, colonoscopy, and any other screening/preventative measures recommended to her by her doctors.   We also encouraged her to make sure her children and other relatives tell their doctors about the family history of cancer and tumors so they can make the best recommendations for them.  The family history does still impact their risk.

## 2017-12-21 NOTE — Telephone Encounter (Signed)
No access to database Last refill 11/19/2017 Ok to refill

## 2017-12-24 ENCOUNTER — Encounter: Payer: Self-pay | Admitting: Nurse Practitioner

## 2017-12-24 ENCOUNTER — Ambulatory Visit (INDEPENDENT_AMBULATORY_CARE_PROVIDER_SITE_OTHER): Payer: Medicare Other | Admitting: Infectious Diseases

## 2017-12-24 ENCOUNTER — Encounter: Payer: Self-pay | Admitting: Infectious Diseases

## 2017-12-24 VITALS — BP 120/77 | HR 91 | Temp 98.0°F | Ht 65.0 in | Wt 131.0 lb

## 2017-12-24 DIAGNOSIS — Z803 Family history of malignant neoplasm of breast: Secondary | ICD-10-CM

## 2017-12-24 DIAGNOSIS — C73 Malignant neoplasm of thyroid gland: Secondary | ICD-10-CM

## 2017-12-24 DIAGNOSIS — B20 Human immunodeficiency virus [HIV] disease: Secondary | ICD-10-CM

## 2017-12-24 DIAGNOSIS — Z113 Encounter for screening for infections with a predominantly sexual mode of transmission: Secondary | ICD-10-CM

## 2017-12-24 DIAGNOSIS — M24541 Contracture, right hand: Secondary | ICD-10-CM | POA: Diagnosis not present

## 2017-12-24 DIAGNOSIS — N63 Unspecified lump in unspecified breast: Secondary | ICD-10-CM

## 2017-12-24 DIAGNOSIS — L989 Disorder of the skin and subcutaneous tissue, unspecified: Secondary | ICD-10-CM

## 2017-12-24 DIAGNOSIS — Z79899 Other long term (current) drug therapy: Secondary | ICD-10-CM

## 2017-12-24 NOTE — Assessment & Plan Note (Signed)
Has f/u in jan

## 2017-12-24 NOTE — Progress Notes (Signed)
   Subjective:    Patient ID: Laurie Gray, female    DOB: Jan 29, 1955, 62 y.o.   MRN: [id number]  HPI 62yo F with hx of dx 2000 HIV+, depression, hyperlipidemia, prev lap hysterecomy-BSO (2009). 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  Has had f/u with GYN, nl PAP 05-2016. Has had previous abn on colpo (SIL).  She had mammo 02-2017 which showed R cysts. U/s 08-2017:   Six-month follow-up bilateral diagnostic mammogram and right breast ultrasound is recommended to continue follow-up of the probably benign mass at 2 o'clock, 3 cm from the nipple. Had colon 2013. Had stool test (-) x 2 this year.  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  She also got radiocative Iodine  Has been depressed since her mother passed away- she was her primary care giver.  Has been started on Cymbalta. Still having arthritis pain. She is also worried about contractures in her hands. "My hand is gross looking, deformed".  TSH .07 08-2017. She has f/u in Jan 2020.   She did not keep derm appt for mole on her L shoulder. Would like to resched.   HIV 1 RNA Quant (copies/mL)  Date Value  12/10/2017 <20 NOT DETECTED  06/11/2017 <20 NOT DETECTED  12/04/2016 <20 NOT DETECTED   CD4 T Cell Abs (/uL)  Date Value  12/10/2017 460  06/11/2017 470  12/04/2016 630    Review of Systems  Constitutional: Positive for fatigue. Negative for appetite change and unexpected weight change.  HENT: Positive for sore throat and voice change.   Cardiovascular: Positive for palpitations.  Gastrointestinal: Negative for constipation and diarrhea.  Genitourinary: Negative for difficulty urinating.  Psychiatric/Behavioral: Negative for sleep disturbance. The patient is nervous/anxious.   worried her thyroid hormone is too high.  Urine stream has slowed down.  Please see HPI. All other systems reviewed and negative.     Objective:   Physical Exam Constitutional:      Appearance:  Normal appearance.  HENT:     Mouth/Throat:     Mouth: Mucous membranes are moist.     Pharynx: No oropharyngeal exudate.  Eyes:     Extraocular Movements: Extraocular movements intact.     Pupils: Pupils are equal, round, and reactive to light.  Neck:     Musculoskeletal: Normal range of motion.  Cardiovascular:     Rate and Rhythm: Normal rate and regular rhythm.     Heart sounds: No murmur.  Pulmonary:     Effort: Pulmonary effort is normal.     Breath sounds: Normal breath sounds.  Abdominal:     General: Abdomen is flat. Bowel sounds are normal.     Palpations: Abdomen is soft.     Tenderness: There is no abdominal tenderness.  Musculoskeletal:     Right lower leg: No edema.     Left lower leg: No edema.  Lymphadenopathy:     Cervical: No cervical adenopathy.  Neurological:     Mental Status: She is alert.  Psychiatric:        Mood and Affect: Mood normal.       Assessment & Plan:

## 2017-12-24 NOTE — Assessment & Plan Note (Signed)
We reviewed her genetic testing.

## 2017-12-24 NOTE — Assessment & Plan Note (Signed)
Will have her seen by hand surgery for possible tendon release.

## 2017-12-24 NOTE — Assessment & Plan Note (Signed)
Will re-refer her to derm

## 2017-12-24 NOTE — Assessment & Plan Note (Signed)
She is doing well Continue odefsy Has gotten flu shot, prev gotten PCV13 Will see her back in 9 months.

## 2017-12-24 NOTE — Assessment & Plan Note (Signed)
Repeat u/s 2020

## 2018-01-04 ENCOUNTER — Encounter: Payer: Self-pay | Admitting: Genetics

## 2018-01-04 ENCOUNTER — Ambulatory Visit: Payer: Self-pay | Admitting: Genetics

## 2018-01-04 DIAGNOSIS — D351 Benign neoplasm of parathyroid gland: Secondary | ICD-10-CM

## 2018-01-04 DIAGNOSIS — Z1379 Encounter for other screening for genetic and chromosomal anomalies: Secondary | ICD-10-CM

## 2018-01-04 DIAGNOSIS — C73 Malignant neoplasm of thyroid gland: Secondary | ICD-10-CM

## 2018-01-04 DIAGNOSIS — D352 Benign neoplasm of pituitary gland: Secondary | ICD-10-CM

## 2018-01-04 DIAGNOSIS — Z803 Family history of malignant neoplasm of breast: Secondary | ICD-10-CM

## 2018-01-04 DIAGNOSIS — D353 Benign neoplasm of craniopharyngeal duct: Secondary | ICD-10-CM

## 2018-01-04 NOTE — Progress Notes (Signed)
HPI:  Ms. Soucy was previously seen in the [facility] on 11/12/2017 due to a personal and family history of cancer and concerns regarding a hereditary predisposition to cancer. Please refer to our prior cancer genetics clinic note for more information regarding Ms. Labuda medical, social and family histories, and our assessment and recommendations, at the time. Ms. Parillo recent genetic test results were disclosed to her, as well as recommendations warranted by these results. These results and recommendations are discussed in more detail below.  CANCER HISTORY:   No history exists.     FAMILY HISTORY:  We obtained a detailed, 4-generation family history.  Significant diagnoses are listed below: Family History  Problem Relation Age of Onset  . Breast cancer Mother 75  . Hypertension Mother   . Cancer Mother   . Hyperlipidemia Mother   . Stroke Mother   . Other Father        'vocal dysautonomia'  . Breast cancer Sister 57  . Osteoarthritis Maternal Grandmother   . Colon cancer Neg Hx     Ms. Rosten has a 62 year-old son and a 62 year-old daughter with no hx of cancer.  She has 4 biological grandchildren. Ms. Dougan has a sister, [name], who has just finished breast cancer treatment (dx 32).  She had genetic testing that revealed some findings, Ms. Higginbotham did not know much about her result.  We reached out to her sister, [name], to see if we can get permission to access her results and discuss them with Ms. Giuffre.  We did not hear back from her sister giving permission to review her results.  Ms. Komatsu father: vocal dysautonomia, no other info known Paternal Aunts/Uncles: unk Paternal cousins: unk Paternal grandfather: unk Paternal grandmother:unk  Ms. Rosander mother: dx with breast cancer at 79, died at 69 (recently) Maternal Aunts/Uncles: 3 maternal uncles died young, no known hx of cancer. 6 maternal aunts died in their 62's/90's no  known hx of cancer.  Not a lot of info known about this side of the family. Maternal cousins:  Unk, knows of 73 female cousin who had cancer, no other details known Maternal grandfather: unk Maternal grandmother:unk  Patient's maternal ancestors are of African Teaching laboratory technician American (Che[place]) descent, and paternal ancestors are of African American descent. There is no reported Ashkenazi Jewish ancestry. There is no known   GENETIC TEST RESULTS: Genetic testing performed through [facility]'s Multi-Cancer Panel + Hyperparathyroidism Panel reported out on 11/27/2017 showed no pathogenic mutations. The Multi-Cancer Panel offered by [facility] includes sequencing and/or deletion duplication testing of the following 91 genes: AIP, ALK, APC, ATM, AP2S1, AXIN2, BAP1, BARD1, BLM, BMPR1A, BRCA1, BRCA2, BRIP1, BUB1B, CASR, CDC73, CDH1, CDK4, CDKN1B, CDKN1C, CDKN2A, CEBPA, CEP57, CHEK2, CTNNA1, DICER1, DIS3L2, EGFR, ENG, EPCAM, FH, FLCN, GALNT12, GATA2, GPC3, GNA11, GREM1, HOXB13, HRAS, KIT, MAX, MEN1, MET, MITF, MLH1, MLH3, MSH2, MSH3, MSH6, MUTYH, NBN, NF1, NF2, NTHL1, PALB2, PDGFRA, PHOX2B, PMS2, POLD1, POLE, POT1, PRKAR1A, PTCH1, PTEN, RAD50, RAD51C, RAD51D, RB1, RECQL4, RET, RNF43, RPS20, RUNX1, SDHA, SDHAF2, SDHB, SDHC, SDHD, SMAD4, SMARCA4, SMARCB1, SMARCE1, STK11, SUFU, TERC, TERT, TMEM127, TP53, TSC1, TSC2, VHL, WRN, WT1  The test report will be scanned into EPIC and will be located under the Molecular Pathology section of the Results Review tab. A portion of the result report is included below for reference.     We discussed with Ms. Aguilera that because current genetic testing is not perfect, it is possible there may be a gene mutation  in one of these genes that current testing cannot detect, but that chance is small.  We also discussed, that there could be another gene that has not yet been discovered, or that we have not yet tested, that is responsible for the cancer diagnoses in the family. It is also  possible there is a hereditary cause for the cancer in the family that Ms. Clare did not inherit and therefore was not identified in her testing.  Therefore, it is important to remain in touch with cancer genetics in the future so that we can continue to offer Ms. Consalvo the most up to date genetic testing.   CANCER SCREENING RECOMMENDATIONS: This negative result means that we were unable to identify a hereditary cause for her personal and family history of cancer at this time.  This result does not rule out a hereditary cause for her  cancer.  While reassuring, tt is still possible that there could be genetic mutations that are undetectable by current technology, or genetic mutations in genes that have not been tested or identified to increase cancer risk.  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 Therefore, her endocrinologist may still give her a clinical diagnosis of MEN even with negative genetic test results.  If this is the case, then her children and other relatives may also be at increased risk and could consider screening.    Based on the patient's personal and family history, the statistical model (Tyrer Cusik)  Was used to estimate her risk of developing breast cancer. This estimates her lifetime risk of developing breast cancer to be approximately 15.7%. The patient's lifetime breast cancer risk is a preliminary estimate based on available information using one of several models endorsed by the [place] (ACS). The ACS recommends consideration of breast MRI screening as an adjunct to mammography for patients at high risk (defined as 20% or greater lifetime risk). A more detailed breast cancer risk assessment can be considered, if clinically indicated.    RECOMMENDATIONS FOR FAMILY MEMBERS:  Relatives in this family might be at some increased  risk of developing cancer, over the general population risk, simply due to the family history of cancer.  We recommended women in this family have a yearly mammogram beginning at age 46, or 67 years younger than the earliest onset of cancer, an annual clinical breast exam, and perform monthly breast self-exams. Women in this family should also have a gynecological exam as recommended by their primary provider. All family members should have a colonoscopy by age 86 (or as directed by their doctors).  All family members should inform their physicians about the family history of cancer so their doctors can make the most appropriate screening recommendations for them.   It is also possible there is a hereditary cause for the cancer in Ms. Strohecker family that she did not inherit and therefore was not identified in her.  Therefore, we recommended her maternal relatives also have genetic counseling and testing. Ms. Galli will let us know if we can be of any assistance in coordinating genetic counseling and/or testing for these family members.   FOLLOW-UP: Lastly, we discussed with Ms. Son that cancer genetics is a rapidly advancing field and it is possible that new genetic tests will be appropriate for her and/or her family members in the future. We encouraged her to remain in contact with cancer  genetics on an annual basis so we can update her personal and family histories and let her know of advances in cancer genetics that may benefit this family.   Our contact number was provided. Ms. Vanderweele questions were answered to her satisfaction, and she knows she is welcome to call us at anytime with additional questions or concerns.   Ferol Luz, MS, [facility] Certified Genetic Counselor [email or web address]

## 2018-01-17 ENCOUNTER — Other Ambulatory Visit: Payer: Self-pay | Admitting: Nurse Practitioner

## 2018-01-18 NOTE — Telephone Encounter (Signed)
Last OV 10/15/2017 Last refill 12/21/2017 Database verified

## 2018-01-22 ENCOUNTER — Encounter: Payer: Self-pay | Admitting: Nurse Practitioner

## 2018-01-22 ENCOUNTER — Encounter: Payer: Medicare Other | Admitting: Family

## 2018-01-22 ENCOUNTER — Ambulatory Visit: Payer: Self-pay

## 2018-01-28 ENCOUNTER — Encounter: Payer: Self-pay | Admitting: Family

## 2018-01-28 ENCOUNTER — Ambulatory Visit (INDEPENDENT_AMBULATORY_CARE_PROVIDER_SITE_OTHER): Payer: Medicare Other | Admitting: Family

## 2018-01-28 VITALS — BP 102/78 | HR 82 | Temp 98.4°F | Resp 18 | Ht 65.0 in | Wt 131.0 lb

## 2018-01-28 DIAGNOSIS — Z23 Encounter for immunization: Secondary | ICD-10-CM

## 2018-01-28 DIAGNOSIS — Z0000 Encounter for general adult medical examination without abnormal findings: Secondary | ICD-10-CM

## 2018-01-28 MED ORDER — ZOSTER VAC RECOMB ADJUVANTED 50 MCG/0.5ML IM SUSR: 0.5000 mL | Freq: Once | INTRAMUSCULAR | 0 refills | Status: DC

## 2018-01-28 MED ORDER — ZOSTER VAC RECOMB ADJUVANTED 50 MCG/0.5ML IM SUSR: 0.5000 mL | Freq: Once | INTRAMUSCULAR | 0 refills | Status: AC

## 2018-01-28 NOTE — Progress Notes (Signed)
Subjective:   Laurie Gray is a 63 y.o. female who presents for Medicare Annual (Subsequent) preventive examination.  Review of Systems:   Cardiac Risk Factors include: hypertension;dyslipidemia;sedentary lifestyle     Objective:     Vitals: BP 102/78   Pulse 82   Temp 98.4 F (36.9 C) (Oral)   Resp 18   Ht 5\' 5"  (1.651 m)   Wt 131 lb (59.4 kg)   SpO2 95%   BMI 21.80 kg/m   Body mass index is 21.8 kg/m.  Advanced Directives 01/28/2018 10/15/2017 06/11/2017 04/29/2017 01/21/2017 12/24/2016 09/01/2016  Does Patient Have a Medical Advance Directive? No No No No No No No  Would patient like information on creating a medical advance directive? No - Patient declined - - Yes (MAU/Ambulatory/Procedural Areas - Information given) Yes (MAU/Ambulatory/Procedural Areas - Information given) - -    Tobacco Social History   Tobacco Use  Smoking Status Never Smoker  Smokeless Tobacco Never Used     Counseling given: Not Answered   Clinical Intake:  Pre-visit preparation completed: No  Pain : 0-10 Pain Score: 3  Pain Type: Chronic pain Pain Location: Other (Comment)(generalized arthritis pain ) Pain Radiating Towards: (generalized joints) Pain Descriptors / Indicators: Constant, Aching Pain Onset: Other (comment)(several years ) Pain Frequency: Constant Pain Relieving Factors: pain medication calms the pain  Effect of Pain on Daily Activities: affects sleep   Pain Relieving Factors: pain medication calms the pain   BMI - recorded: 21.8 Nutritional Status: BMI of 19-24  Normal Nutritional Risks: None Diabetes: No  How often do you need to have someone help you when you read instructions, pamphlets, or other written materials from your doctor or pharmacy?: 1 - Never What is the last grade level you completed in school?: Junior year of college   Interpreter Needed?: No  Information entered by :: Dinah Ngetich FNP-C   Past Medical History:  Diagnosis Date  . Anemia     . Anxiety    hx of-not on any meds  . Arthritis    "shoulders, arms, fingers, neck, back" (01/31/2016)  . Chronic cervical pain   . Family history of breast cancer   . History of bronchitis   . History of colon polyps   . History of kidney stones   . HIV infection ([place]) dx'd 2000   takes odefsey  . Hyperlipidemia    takes Pravastatin daily  . Hypertension    takes Benicar HCT daily  . Hyperthyroidism   . Hypothyroidism   . Internal hemorrhoids   . Joint pain   . Migraine    "in the past" (01/31/2016)  . Parathyroid adenoma   . Pituitary adenoma ([place])   . Refusal of blood transfusions as patient is Jehovah's Witness    "but substitutes are fine". (01/31/2016)  . Thyroid cancer ([place]) 12/2015  . Upper back pain    Past Surgical History:  Procedure Laterality Date  . ABDOMINAL HYSTERECTOMY    . BREAST EXCISIONAL BIOPSY Left    no scar seen   . BUNIONECTOMY Right   . COLONOSCOPY    . CYSTOSCOPY    . HAMMER TOE SURGERY Left    "middle toe was broke; turned into hammertoe then repaired"  . HAMMER TOE SURGERY Bilateral    "both small toes"  . PARATHYROIDECTOMY Right 01/31/2016  . PARATHYROIDECTOMY Right 01/31/2016   Procedure: PARATHYROIDECTOMY ON RIGHT SIDE;  Surgeon: Izora Gala, MD;  Location: [facility];  Service: ENT;  Laterality: Right;  .  SALPINGOOPHORECTOMY Bilateral   . THYROIDECTOMY N/A 01/31/2016   Procedure: TOTAL THYROIDECTOMY;  Surgeon: Izora Gala, MD;  Location: [facility];  Service: ENT;  Laterality: N/A;  . TOE SURGERY     bunion  . TONSILLECTOMY     as a child  . TOTAL THYROIDECTOMY  01/31/2016  . TUBAL LIGATION     30 yrs ago   Family History  Problem Relation Age of Onset  . Breast cancer Mother 79  . Hypertension Mother   . Cancer Mother   . Hyperlipidemia Mother   . Stroke Mother   . Other Father        'vocal dysautonomia'  . Breast cancer Sister 67  . Osteoarthritis Maternal Grandmother   . Colon cancer Neg Hx    Social History   Socioeconomic  History  . Marital status: Widowed    Spouse name: Not on file  . Number of children: Not on file  . Years of education: Not on file  . Highest education level: Not on file  Occupational History  . Not on file  Social Needs  . Financial resource strain: Not hard at all  . Food insecurity:    Worry: Never true    Inability: Never true  . Transportation needs:    Medical: No    Non-medical: No  Tobacco Use  . Smoking status: Never Smoker  . Smokeless tobacco: Never Used  Substance and Sexual Activity  . Alcohol use: Yes    Alcohol/week: 0.0 standard drinks    Comment: 01/31/2016 "might go out to eat and have a drink once/month; no more than that"  . Drug use: No  . Sexual activity: Not Currently    Partners: Male    Comment: declined condoms  Lifestyle  . Physical activity:    Days per week: 0 days    Minutes per session: 0 min  . Stress: Only a little  Relationships  . Social connections:    Talks on phone: More than three times a week    Gets together: More than three times a week    Attends religious service: More than 4 times per year    Active member of club or organization: No    Attends meetings of clubs or organizations: Never    Relationship status: Widowed  Other Topics Concern  . Not on file  Social History Narrative   Normal diet   Drinks Caffeine- coffee/coke/chocolate    Lives in an apartment/ third level/1 person/1 pet   Past profession- Museum/gallery exhibitions officer, cosmetologist   Exercises 3-4 x weekly (cardio and weights)        Outpatient Encounter Medications as of 01/28/2018  Medication Sig  . Cyanocobalamin (B-12 PO) Take 1 tablet by mouth daily.  . DULoxetine (CYMBALTA) 60 MG capsule TAKE 1 CAPSULE BY MOUTH EVERY DAY  . glucosamine-chondroitin 500-400 MG tablet Take 1 tablet by mouth 2 (two) times daily.  . hydrocortisone (PROCTO-PAK) 1 % CREA Twice daily as needed for hemorrhoids  . levothyroxine (SYNTHROID, LEVOTHROID) 88 MCG tablet TAKE 1 TABLET BY  MOUTH EVERY DAY  . Menthol-Camphor (ICY HOT ADVANCED RELIEF) 16-11 % CREA Apply 1 application topically 4 (four) times daily as needed (for shoulder pain).  . Multiple Vitamins-Minerals (MULTIVITAMIN ADULTS 50+ PO) Take 1 tablet by mouth daily.  . ODEFSEY 200-25-25 MG TABS tablet TAKE 1 TABLET BY MOUTH EVERY DAY WITH BREAKFAST  . olmesartan-hydrochlorothiazide (BENICAR HCT) 20-12.5 MG tablet TAKE 1 TABLET BY MOUTH DAILY.  Marland Kitchen omega-3 acid  ethyl esters (LOVAZA) 1 g capsule TAKE 1 CAPSULE EVERY DAY  . pravastatin (PRAVACHOL) 40 MG tablet Take 1 tablet (40 mg total) by mouth daily.  . traMADol (ULTRAM) 50 MG tablet TAKE 1 TABLET BY MOUTH THREE TIMES A DAY AS NEEDED FOR PAIN   No facility-administered encounter medications on file as of 01/28/2018.     Activities of Daily Living In your present state of health, do you have any difficulty performing the following activities: 01/28/2018  Hearing? N  Vision? N  Difficulty concentrating or making decisions? Y  Comment sometimes   Walking or climbing stairs? Y  Comment hurnts her joints   Dressing or bathing? N  Doing errands, shopping? N  Preparing Food and eating ? N  Using the Toilet? N  In the past six months, have you accidently leaked urine? N  Do you have problems with loss of bowel control? N  Managing your Medications? N  Managing your Finances? N  Housekeeping or managing your Housekeeping? N  Some recent data might be hidden    Patient Care Team: Lauree Chandler, NP as PCP - General (Geriatric Medicine) Campbell Riches, MD as PCP - Infectious Diseases (Infectious Diseases) Jackolyn Confer, MD as Consulting Physician (General Surgery)    Assessment:   This is a routine wellness examination for [place].  Exercise Activities and Dietary recommendations Current Exercise Habits: The patient does not participate in regular exercise at present, Exercise limited by: Other - see comments(generalized arthritis )  Goals    .  Exercise 3x per week (30 min per time)     Patient would like to get to the gym 3 times a week    . Increase physical activity     Starting 01/21/16, I will attempt to increase my water intake from 2 cups to 5 cups per day.        Fall Risk Fall Risk  01/28/2018 10/15/2017 06/25/2017 06/11/2017 04/29/2017  Falls in the past year? 0 No No No Yes  Number falls in past yr: 0 - - - 1  Injury with Fall? 0 - - - Yes   Is the patient's home free of loose throw rugs in walkways, pet beds, electrical cords, etc?   no      Grab bars in the bathroom? no      Handrails on the stairs?   yes      Adequate lighting?   yes  Depression Screen PHQ 2/9 Scores 01/28/2018 06/25/2017 01/21/2017 12/04/2016  PHQ - 2 Score 0 0 1 1  PHQ- 9 Score - - - -     Cognitive Function MMSE - Mini Mental State Exam 01/28/2018 01/21/2016  Orientation to time 5 5  Orientation to Place 5 5  Registration 3 3  Attention/ Calculation 5 5  Recall 3 3  Language- name 2 objects 2 2  Language- repeat 1 1  Language- follow 3 step command 3 3  Language- read & follow direction 1 1  Write a sentence 1 1  Copy design 1 1  Total score 30 30        Immunization History  Administered Date(s) Administered  . Hepatitis B, adult 06/09/2013, 07/11/2013, 12/05/2013  . Influenza Split 09/08/2010, 11/10/2011  . Influenza Whole 12/25/2008, 09/24/2009  . Influenza,inj,Quad PF,6+ Mos 10/11/2012, 11/02/2013, 11/08/2014, 10/22/2015, 09/24/2016  . Influenza-Unspecified 10/15/2017  . Pneumococcal Conjugate-13 03/28/2014  . Pneumococcal Polysaccharide-23 01/24/2010    Qualifies for Shingles Vaccine? Yes   Screening Tests Health Maintenance  Topic Date Due  . TETANUS/TDAP  01/07/2020 (Originally 10/02/1974)  . MAMMOGRAM  02/19/2019  . PAP SMEAR-Modifier  06/06/2019  . COLONOSCOPY  01/09/2025  . INFLUENZA VACCINE  Completed  . Hepatitis C Screening  Completed  . HIV Screening  Completed    Cancer Screenings: Lung: Low Dose CT  Chest recommended if Age 75-80 years, 30 pack-year currently smoking OR have quit w/in 15years. Patient does not qualify. Breast:  Up to date on Mammogram? Yes   Up to date of Bone Density/Dexa? Yes Colorectal: up to date 01/09/2025  Additional Screenings:  Hepatitis C Screening: Negative     Plan:   - Shirex vaccine ordered - mammogram 2020 has appointment in 1 week    I have personally reviewed and noted the following in the patient's chart:   . Medical and social history . Use of alcohol, tobacco or illicit drugs  . Current medications and supplements . Functional ability and status . Nutritional status . Physical activity . Advanced directives . List of other physicians . Hospitalizations, surgeries, and ER visits in previous 12 months . Vitals . Screenings to include cognitive, depression, and falls . Referrals and appointments  In addition, I have reviewed and discussed with patient certain preventive protocols, quality metrics, and best practice recommendations. A written personalized care plan for preventive services as well as general preventive health recommendations were provided to patient.    Sandrea Hughs, NP  01/28/2018

## 2018-01-28 NOTE — Patient Instructions (Signed)
Ms. Laurie Gray , Thank you for taking time to come for your Medicare Wellness Visit. I appreciate your ongoing commitment to your health goals. Please review the following plan we discussed and let me know if I can assist you in the future.   Screening recommendations/referrals: Colonoscopy: up to date 01/09/2025 Mammogram: Has up coming appointment in 1 week  Bone Density: up to date  Recommended yearly ophthalmology/optometry visit for glaucoma screening and checkup Recommended yearly dental visit for hygiene and checkup  Vaccinations: Influenza vaccine: Annually up to date.  Pneumococcal vaccine: Up to date  Tdap vaccine; Due 01/07/2020 Shingles vaccine:Due 2020    Advanced directives: None has form will fill then provide copy   Conditions/risks identified:Hypertension,Hyperlipidemia,Sedatary life style.Dietary and life style changes discussed.   Next appointment: 1 year   Preventive Care 40-64 Years, Female Preventive care refers to lifestyle choices and visits with your health care provider that can promote health and wellness. What does preventive care include?  A yearly physical exam. This is also called an annual well check.  Dental exams once or twice a year.  Routine eye exams. Ask your health care provider how often you should have your eyes checked.  Personal lifestyle choices, including:  Daily care of your teeth and gums.  Regular physical activity.  Eating a healthy diet.  Avoiding tobacco and drug use.  Limiting alcohol use.  Practicing safe sex.  Taking low-dose aspirin daily starting at age 74.  Taking vitamin and mineral supplements as recommended by your health care provider. What happens during an annual well check? The services and screenings done by your health care provider during your annual well check will depend on your age, overall health, lifestyle risk factors, and family history of disease. Counseling  Your health care provider may ask you  questions about your:  Alcohol use.  Tobacco use.  Drug use.  Emotional well-being.  Home and relationship well-being.  Sexual activity.  Eating habits.  Work and work Statistician.  Method of birth control.  Menstrual cycle.  Pregnancy history. Screening  You may have the following tests or measurements:  Height, weight, and BMI.  Blood pressure.  Lipid and cholesterol levels. These may be checked every 5 years, or more frequently if you are over 85 years old.  Skin check.  Lung cancer screening. You may have this screening every year starting at age 82 if you have a 30-pack-year history of smoking and currently smoke or have quit within the past 15 years.  Fecal occult blood test (FOBT) of the stool. You may have this test every year starting at age 41.  Flexible sigmoidoscopy or colonoscopy. You may have a sigmoidoscopy every 5 years or a colonoscopy every 10 years starting at age 28.  Hepatitis C blood test.  Hepatitis B blood test.  Sexually transmitted disease (STD) testing.  Diabetes screening. This is done by checking your blood sugar (glucose) after you have not eaten for a while (fasting). You may have this done every 1-3 years.  Mammogram. This may be done every 1-2 years. Talk to your health care provider about when you should start having regular mammograms. This may depend on whether you have a family history of breast cancer.  BRCA-related cancer screening. This may be done if you have a family history of breast, ovarian, tubal, or peritoneal cancers.  Pelvic exam and Pap test. This may be done every 3 years starting at age 22. Starting at age 8, this may be done every  5 years if you have a Pap test in combination with an HPV test.  Bone density scan. This is done to screen for osteoporosis. You may have this scan if you are at high risk for osteoporosis. Discuss your test results, treatment options, and if necessary, the need for more tests with  your health care provider. Vaccines  Your health care provider may recommend certain vaccines, such as:  Influenza vaccine. This is recommended every year.  Tetanus, diphtheria, and acellular pertussis (Tdap, Td) vaccine. You may need a Td booster every 10 years.  Zoster vaccine. You may need this after age 56.  Pneumococcal 13-valent conjugate (PCV13) vaccine. You may need this if you have certain conditions and were not previously vaccinated.  Pneumococcal polysaccharide (PPSV23) vaccine. You may need one or two doses if you smoke cigarettes or if you have certain conditions. Talk to your health care provider about which screenings and vaccines you need and how often you need them. This information is not intended to replace advice given to you by your health care provider. Make sure you discuss any questions you have with your health care provider. Document Released: 01/19/2015 Document Revised: 09/12/2015 Document Reviewed: 10/24/2014 Elsevier Interactive Patient Education  2017 [place] Prevention in the Home Falls can cause injuries. They can happen to people of all ages. There are many things you can do to make your home safe and to help prevent falls. What can I do on the outside of my home?  Regularly fix the edges of walkways and driveways and fix any cracks.  Remove anything that might make you trip as you walk through a door, such as a raised step or threshold.  Trim any bushes or trees on the path to your home.  Use bright outdoor lighting.  Clear any walking paths of anything that might make someone trip, such as rocks or tools.  Regularly check to see if handrails are loose or broken. Make sure that both sides of any steps have handrails.  Any raised decks and porches should have guardrails on the edges.  Have any leaves, snow, or ice cleared regularly.  Use sand or salt on walking paths during winter.  Clean up any spills in your garage right  away. This includes oil or grease spills. What can I do in the bathroom?  Use night lights.  Install grab bars by the toilet and in the tub and shower. Do not use towel bars as grab bars.  Use non-skid mats or decals in the tub or shower.  If you need to sit down in the shower, use a plastic, non-slip stool.  Keep the floor dry. Clean up any water that spills on the floor as soon as it happens.  Remove soap buildup in the tub or shower regularly.  Attach bath mats securely with double-sided non-slip rug tape.  Do not have throw rugs and other things on the floor that can make you trip. What can I do in the bedroom?  Use night lights.  Make sure that you have a light by your bed that is easy to reach.  Do not use any sheets or blankets that are too big for your bed. They should not hang down onto the floor.  Have a firm chair that has side arms. You can use this for support while you get dressed.  Do not have throw rugs and other things on the floor that can make you trip. What can I  do in the kitchen?  Clean up any spills right away.  Avoid walking on wet floors.  Keep items that you use a lot in easy-to-reach places.  If you need to reach something above you, use a strong step stool that has a grab bar.  Keep electrical cords out of the way.  Do not use floor polish or wax that makes floors slippery. If you must use wax, use non-skid floor wax.  Do not have throw rugs and other things on the floor that can make you trip. What can I do with my stairs?  Do not leave any items on the stairs.  Make sure that there are handrails on both sides of the stairs and use them. Fix handrails that are broken or loose. Make sure that handrails are as long as the stairways.  Check any carpeting to make sure that it is firmly attached to the stairs. Fix any carpet that is loose or worn.  Avoid having throw rugs at the top or bottom of the stairs. If you do have throw rugs, attach  them to the floor with carpet tape.  Make sure that you have a light switch at the top of the stairs and the bottom of the stairs. If you do not have them, ask someone to add them for you. What else can I do to help prevent falls?  Wear shoes that:  Do not have high heels.  Have rubber bottoms.  Are comfortable and fit you well.  Are closed at the toe. Do not wear sandals.  If you use a stepladder:  Make sure that it is fully opened. Do not climb a closed stepladder.  Make sure that both sides of the stepladder are locked into place.  Ask someone to hold it for you, if possible.  Clearly mark and make sure that you can see:  Any grab bars or handrails.  First and last steps.  Where the edge of each step is.  Use tools that help you move around (mobility aids) if they are needed. These include:  Canes.  Walkers.  Scooters.  Crutches.  Turn on the lights when you go into a dark area. Replace any light bulbs as soon as they burn out.  Set up your furniture so you have a clear path. Avoid moving your furniture around.  If any of your floors are uneven, fix them.  If there are any pets around you, be aware of where they are.  Review your medicines with your doctor. Some medicines can make you feel dizzy. This can increase your chance of falling. Ask your doctor what other things that you can do to help prevent falls. This information is not intended to replace advice given to you by your health care provider. Make sure you discuss any questions you have with your health care provider. Document Released: 10/19/2008 Document Revised: 05/31/2015 Document Reviewed: 01/27/2014 Elsevier Interactive Patient Education  2017 [employer].

## 2018-02-01 IMAGING — US US THYROID
1 series · 12 of 25 positions shown · non-contrast
Comparison: 11/21/2003

CLINICAL DATA: Other.  Hyperparathyroidism with thyroid nodules.

EXAM:
THYROID ULTRASOUND
TECHNIQUE: Ultrasound examination of the thyroid gland and adjacent soft
tissues was performed.

[Series 1: us thyroid · 0.06mm/px · 12 of 46 slices shown]
[im 2/46]
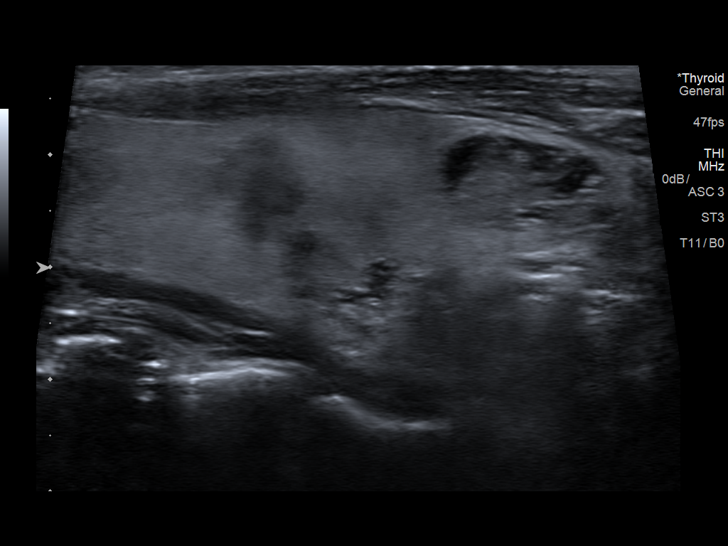
[im 6/46]
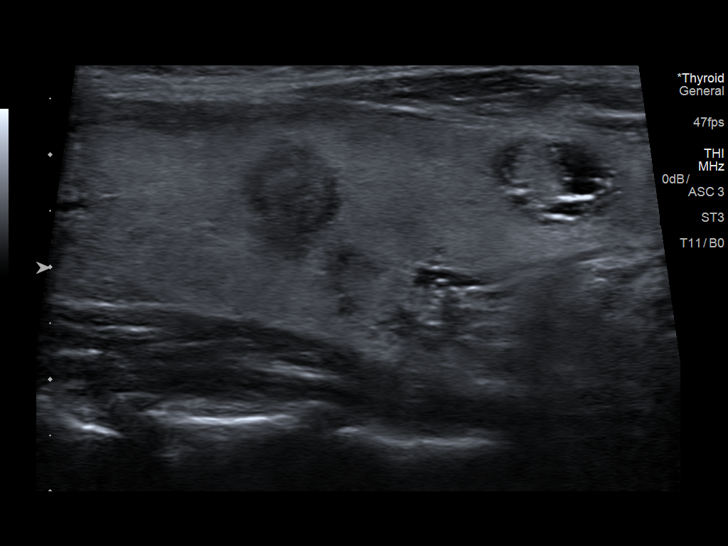
[im 10/46]
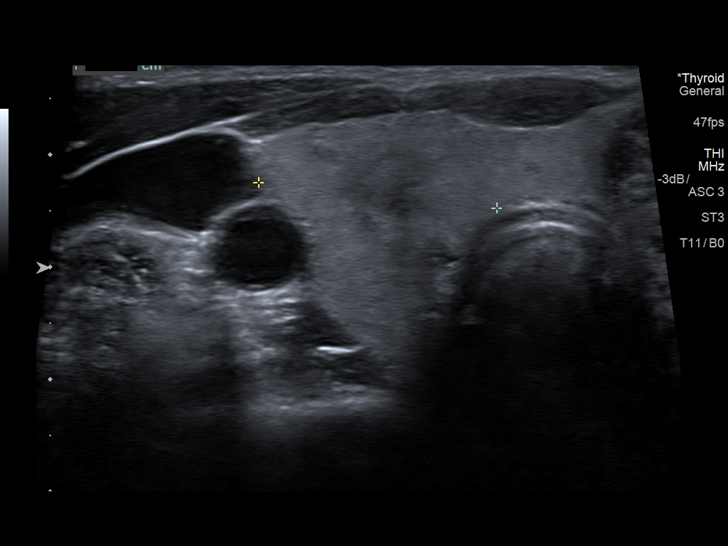
[im 14/46]
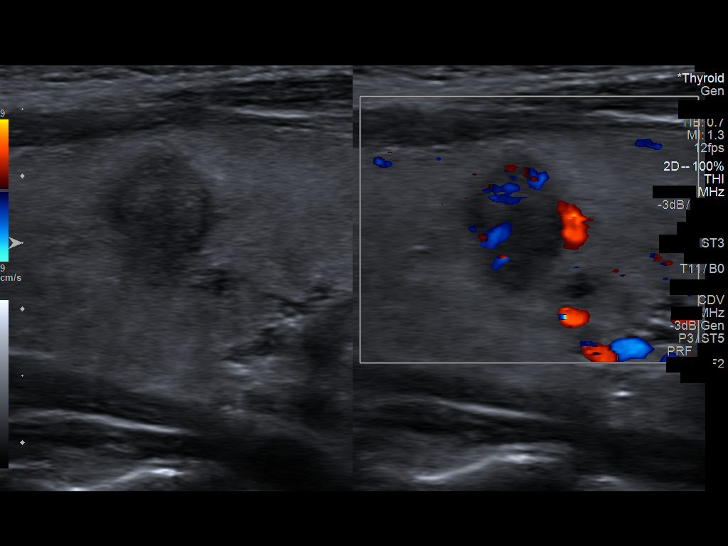
[im 17/46]
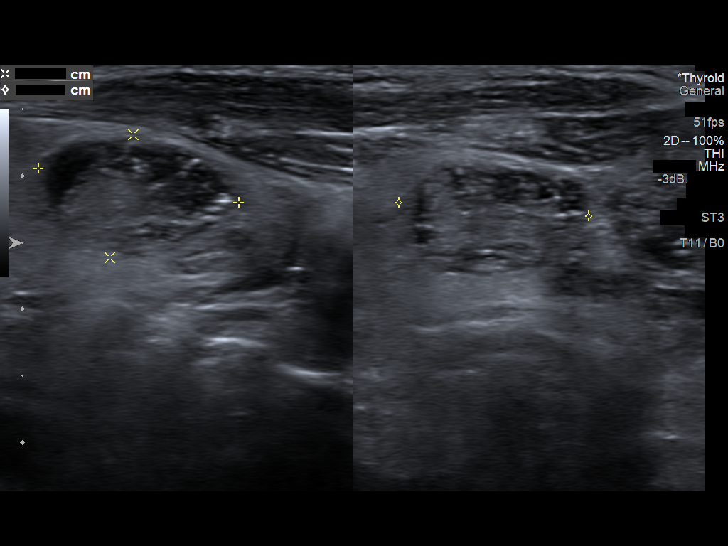
[im 21/46]
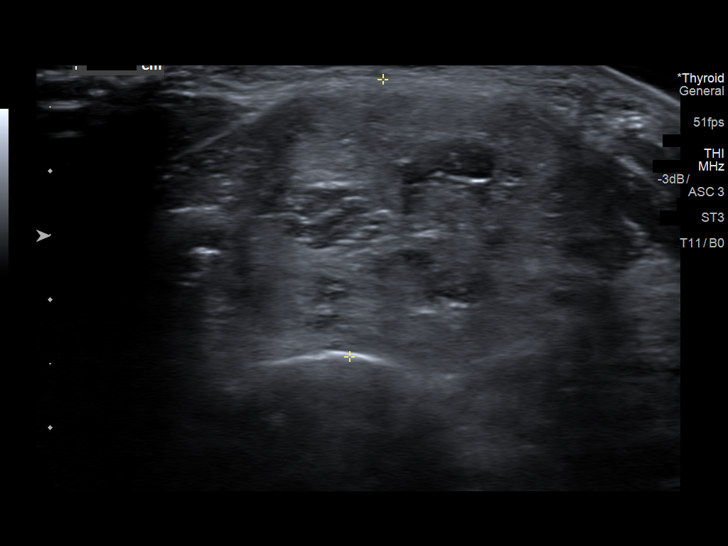
[im 25/46]
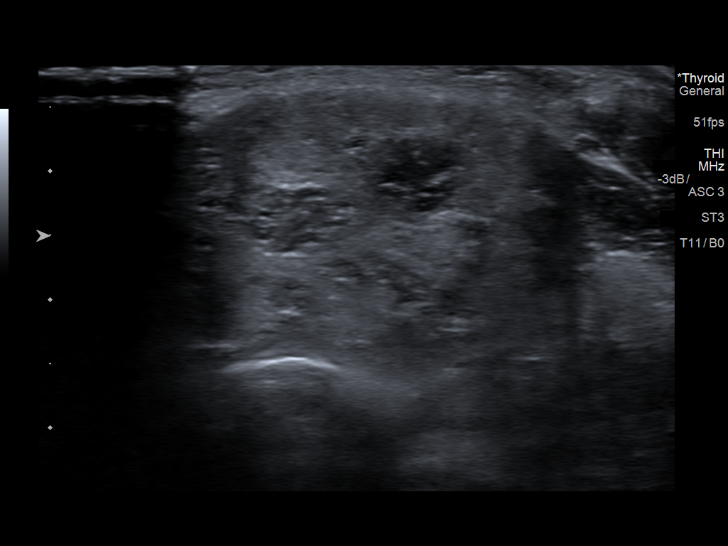
[im 29/46]
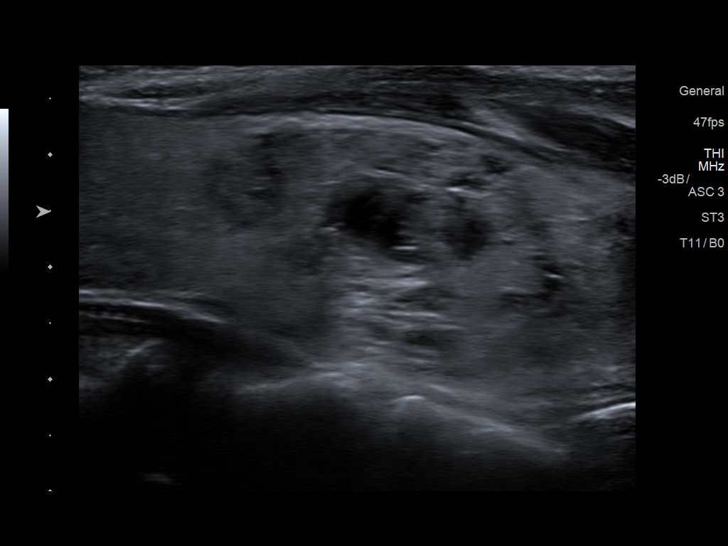
[im 32/46]
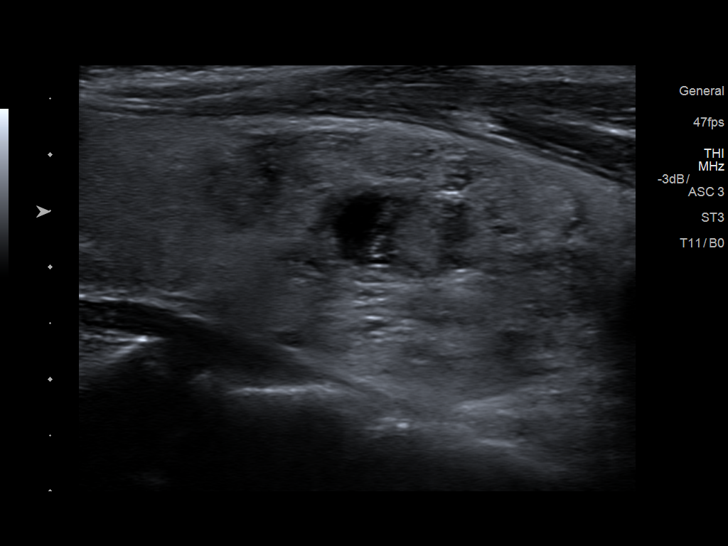
[im 36/46]
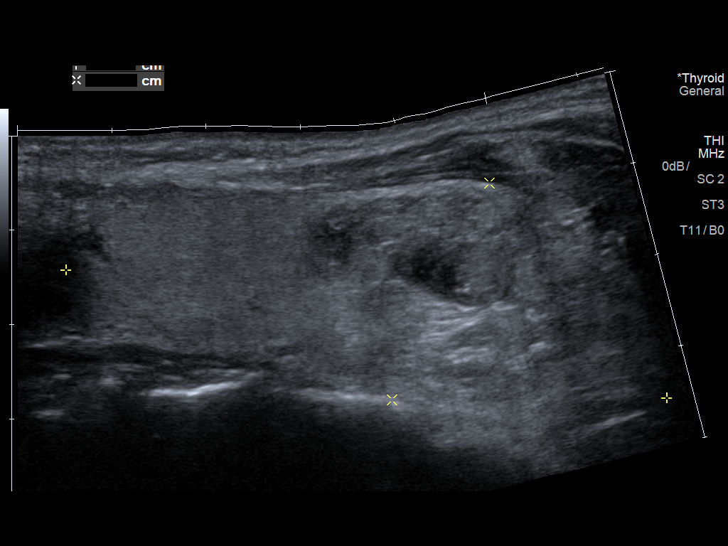
[im 40/46]
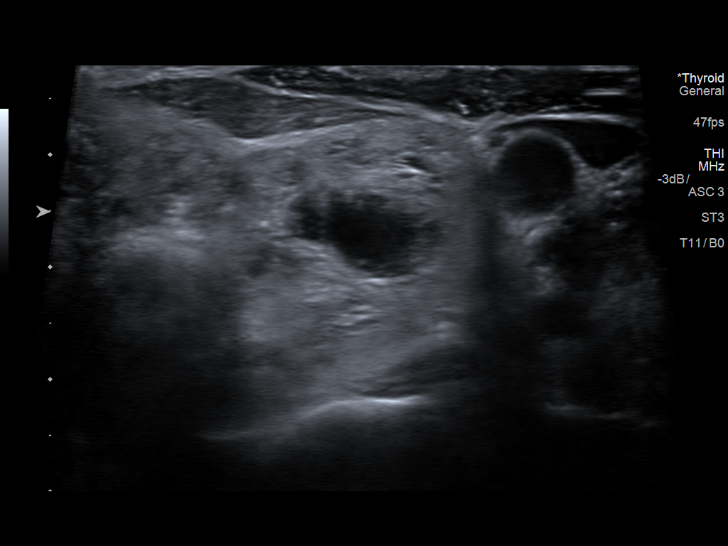
[im 44/46]
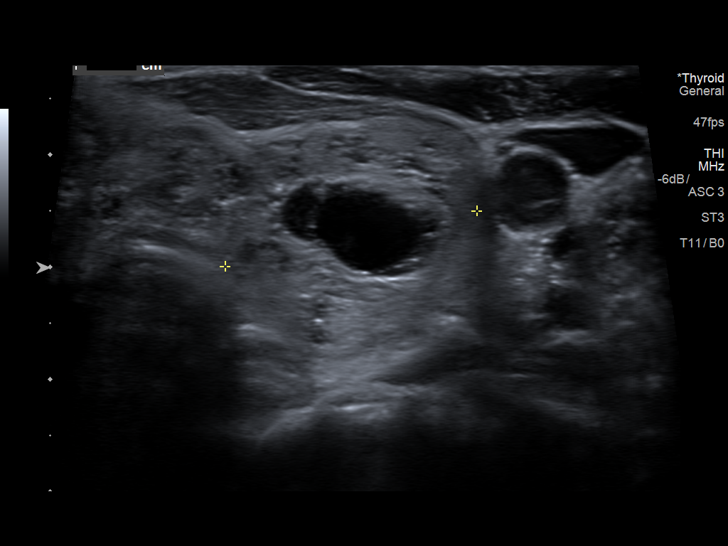

[12 of 25 positions shown; findings below may reference images not displayed]

FINDINGS: Parenchymal Echotexture: Mildly heterogenous

Estimated total number of nodules >/= 1 cm: 4

Number of spongiform nodules >/=  2 cm not described below (TR1): 0

Number of mixed cystic and solid nodules >/= 1.5 cm not described
below (TR2): 0

_________________________________________________________

Isthmus: Measures 2.2 cm in thickness.

Nodule # 1:

Location: Isthmus; Mid

Size: Measures 3.5 x 2.4 x 3.2 cm. This nodule previously measured
2.5 x 1.3 x 2.0 cm.

Composition: solid/almost completely solid (2)

Echogenicity: isoechoic (1)

Shape: not taller-than-wide (0)

Margins: ill-defined (0)

Echogenic foci: none (0)

ACR TI-RADS total points: 3.

ACR TI-RADS risk category: TR3 (3 points).

ACR TI-RADS recommendations:

**Given size (>/= 2.5 cm) and appearance, fine needle aspiration of
this mildly suspicious nodule should be considered based on TI-RADS
criteria.

_________________________________________________________

Right lobe: Measures 5.9 x 2.2 x 2.1 cm and previously measured
x 1.5 x 2.0 cm.

Nodule # 2:

Location: Right; Mid

Size: 0.8 x 1.1 x 1.0 cm and previously measured 0.6 x 0.6 x 0.6 cm.

Composition: solid/almost completely solid (2)

Echogenicity: hypoechoic (2)

Shape: taller-than-wide (3)

Margins: ill-defined (0)

Echogenic foci: none (0)

ACR TI-RADS total points: 7.

ACR TI-RADS risk category: TR5 (>/= 7 points).

ACR TI-RADS recommendations:

**Given size (>/= 1.0 cm) and appearance, fine needle aspiration of
this highly suspicious nodule should be considered based on TI-RADS
criteria.

Nodule # 3:

Location: Right; Inferior

Size: Measures 1.5 x 0.9 x 1.4 cm

Composition: solid/almost completely solid (2)

Echogenicity: hypoechoic (2)

Shape: not taller-than-wide (0)

Margins: ill-defined (0)

Echogenic foci: none (0)

ACR TI-RADS total points: 4.

ACR TI-RADS risk category: TR4 (4-6 points).

ACR TI-RADS recommendations:

**Given size (>/= 1.5 cm) and appearance, fine needle aspiration of
this moderately suspicious nodule should be considered based on
TI-RADS criteria.

_________________________________________________________

Left lobe: Measures 6.5 x 2.5 x 2.5 cm and previously measured 5.2 x
1.7 x 1.9 cm.

Nodule # 4:

Location: Left; Inferior

Size: Measures 3.6 x 2.3 x 2.3 cm and previously measured 2.5 x
x 1.5 cm.

Composition: solid/almost completely solid (2)

Echogenicity: isoechoic (1)

Shape: not taller-than-wide (0)

Margins: ill-defined (0)

Echogenic foci: none (0)

ACR TI-RADS total points: 3.

ACR TI-RADS risk category: TR3 (3 points).

ACR TI-RADS recommendations:

**Given size (>/= 2.5 cm) and appearance, fine needle aspiration of
this mildly suspicious nodule should be considered based on TI-RADS
criteria.
IMPRESSION: Multi nodular goiter.

Three nodules have demonstrated interval growth. The nodule along
the inferior right thyroid lobe appears new since 8772. TI-RADs
recommends no more than 2 nodule biopsies at a time. Based on the
TI-RAD scores, recommend biopsying the right thyroid nodules based
on the higher scores and following the other nodules in one year.
However, recommend correlation with any previous biopsies as well.

The above is in keeping with the ACR TI-RADS recommendations - [HOSPITAL] 0739;[DATE].

## 2018-02-11 ENCOUNTER — Ambulatory Visit (INDEPENDENT_AMBULATORY_CARE_PROVIDER_SITE_OTHER): Payer: Medicare Other | Admitting: Endocrinology

## 2018-02-11 ENCOUNTER — Ambulatory Visit
Admission: RE | Admit: 2018-02-11 | Discharge: 2018-02-11 | Disposition: A | Discharge status: Home / Self Care | Payer: Medicare Other | Source: Ambulatory Visit | Attending: Nurse Practitioner | Admitting: Nurse Practitioner

## 2018-02-11 ENCOUNTER — Encounter: Payer: Self-pay | Admitting: Endocrinology

## 2018-02-11 VITALS — BP 130/70 | HR 67 | Ht 65.0 in | Wt 133.0 lb

## 2018-02-11 DIAGNOSIS — C73 Malignant neoplasm of thyroid gland: Secondary | ICD-10-CM

## 2018-02-11 DIAGNOSIS — N6001 Solitary cyst of right breast: Secondary | ICD-10-CM

## 2018-02-11 DIAGNOSIS — E236 Other disorders of pituitary gland: Secondary | ICD-10-CM

## 2018-02-11 DIAGNOSIS — D351 Benign neoplasm of parathyroid gland: Secondary | ICD-10-CM | POA: Diagnosis not present

## 2018-02-11 LAB — CORTISOL: Cortisol, Plasma: 7.3 ug/dL

## 2018-02-11 LAB — TSH: TSH: 0.04 u[IU]/mL — ABNORMAL LOW (ref 0.35–4.50)

## 2018-02-11 LAB — T4, FREE: Free T4: 1.09 ng/dL (ref 0.60–1.60)

## 2018-02-11 MED ORDER — LEVOTHYROXINE SODIUM 50 MCG PO TABS: 50.0000 ug | ORAL_TABLET | Freq: Every day | ORAL | 3 refills | Status: DC

## 2018-02-11 NOTE — Patient Instructions (Addendum)
blood tests are requested for you today.  We'll let you know about the results.   Please come back for a follow-up appointment in 6 months.  

## 2018-02-11 NOTE — Progress Notes (Signed)
Subjective:    Patient ID: Laurie Gray, female    DOB: 1955/06/24, 63 y.o.   MRN: [id number]  HPI Pt returns for f/u of differentiated thyroid cancer, with this chronology: stage 1 1/18: thyroidectomy: PAPILLARY CARCINOMA, 0.9 CM. - FOCAL MICROSCOPIC EXTRATHYROIDAL EXTENSION PRESENT; INVOLVES RESECTION MARGIN 3/18: RAI with thyrogen, 109 mCi 3/18: post-therapy scan: residual uptake in the thyroid bed. No evidence for distant metastatic disease.   1/19: TG=0.1 (Ab neg) Pt also has pituitary cyst (dx'ed 2005, when MRI showed 3.5 x 6.5 mm cyst; f/u in 2010 was unchanged). She reports slight tremor of the hands, and assoc palpitations.  Past Medical History:  Diagnosis Date  . Anemia   . Anxiety    hx of-not on any meds  . Arthritis    "shoulders, arms, fingers, neck, back" (01/31/2016)  . Chronic cervical pain   . Family history of breast cancer   . History of bronchitis   . History of colon polyps   . History of kidney stones   . HIV infection ([place]) dx'd 2000   takes odefsey  . Hyperlipidemia    takes Pravastatin daily  . Hypertension    takes Benicar HCT daily  . Hyperthyroidism   . Hypothyroidism   . Internal hemorrhoids   . Joint pain   . Migraine    "in the past" (01/31/2016)  . Parathyroid adenoma   . Pituitary adenoma ([place])   . Refusal of blood transfusions as patient is Jehovah's Witness    "but substitutes are fine". (01/31/2016)  . Thyroid cancer ([place]) 12/2015  . Upper back pain     Past Surgical History:  Procedure Laterality Date  . ABDOMINAL HYSTERECTOMY    . BREAST EXCISIONAL BIOPSY Left    no scar seen   . BUNIONECTOMY Right   . COLONOSCOPY    . CYSTOSCOPY    . HAMMER TOE SURGERY Left    "middle toe was broke; turned into hammertoe then repaired"  . HAMMER TOE SURGERY Bilateral    "both small toes"  . PARATHYROIDECTOMY Right 01/31/2016  . PARATHYROIDECTOMY Right 01/31/2016   Procedure: PARATHYROIDECTOMY ON RIGHT SIDE;  Surgeon: Izora Gala, MD;   Location: [facility];  Service: ENT;  Laterality: Right;  . SALPINGOOPHORECTOMY Bilateral   . THYROIDECTOMY N/A 01/31/2016   Procedure: TOTAL THYROIDECTOMY;  Surgeon: Izora Gala, MD;  Location: [facility];  Service: ENT;  Laterality: N/A;  . TOE SURGERY     bunion  . TONSILLECTOMY     as a child  . TOTAL THYROIDECTOMY  01/31/2016  . TUBAL LIGATION     30 yrs ago    Social History   Socioeconomic History  . Marital status: Widowed    Spouse name: Not on file  . Number of children: Not on file  . Years of education: Not on file  . Highest education level: Not on file  Occupational History  . Not on file  Social Needs  . Financial resource strain: Not hard at all  . Food insecurity:    Worry: Never true    Inability: Never true  . Transportation needs:    Medical: No    Non-medical: No  Tobacco Use  . Smoking status: Never Smoker  . Smokeless tobacco: Never Used  Substance and Sexual Activity  . Alcohol use: Yes    Alcohol/week: 0.0 standard drinks    Comment: 01/31/2016 "might go out to eat and have a drink once/month; no more than that"  . Drug  use: No  . Sexual activity: Not Currently    Partners: Male    Comment: declined condoms  Lifestyle  . Physical activity:    Days per week: 0 days    Minutes per session: 0 min  . Stress: Only a little  Relationships  . Social connections:    Talks on phone: More than three times a week    Gets together: More than three times a week    Attends religious service: More than 4 times per year    Active member of club or organization: No    Attends meetings of clubs or organizations: Never    Relationship status: Widowed  . Intimate partner violence:    Fear of current or ex partner: No    Emotionally abused: No    Physically abused: No    Forced sexual activity: No  Other Topics Concern  . Not on file  Social History Narrative   Normal diet   Drinks Caffeine- coffee/coke/chocolate    Lives in an apartment/ third level/1  person/1 pet   Past profession- Museum/gallery exhibitions officer, cosmetologist   Exercises 3-4 x weekly (cardio and weights)        Current Outpatient Medications on File Prior to Visit  Medication Sig Dispense Refill  . Cyanocobalamin (B-12 PO) Take 1 tablet by mouth daily.    . DULoxetine (CYMBALTA) 60 MG capsule TAKE 1 CAPSULE BY MOUTH EVERY DAY 90 capsule 2  . glucosamine-chondroitin 500-400 MG tablet Take 1 tablet by mouth 2 (two) times daily.    . hydrocortisone (PROCTO-PAK) 1 % CREA Twice daily as needed for hemorrhoids 28.35 g 1  . Menthol-Camphor (ICY HOT ADVANCED RELIEF) 16-11 % CREA Apply 1 application topically 4 (four) times daily as needed (for shoulder pain).    . Multiple Vitamins-Minerals (MULTIVITAMIN ADULTS 50+ PO) Take 1 tablet by mouth daily.    . ODEFSEY 200-25-25 MG TABS tablet TAKE 1 TABLET BY MOUTH EVERY DAY WITH BREAKFAST 90 tablet 2  . olmesartan-hydrochlorothiazide (BENICAR HCT) 20-12.5 MG tablet TAKE 1 TABLET BY MOUTH DAILY. 90 tablet 1  . omega-3 acid ethyl esters (LOVAZA) 1 g capsule TAKE 1 CAPSULE EVERY DAY 90 capsule 0  . pravastatin (PRAVACHOL) 40 MG tablet Take 1 tablet (40 mg total) by mouth daily. 90 tablet 1  . traMADol (ULTRAM) 50 MG tablet TAKE 1 TABLET BY MOUTH THREE TIMES A DAY AS NEEDED FOR PAIN 90 tablet 0   No current facility-administered medications on file prior to visit.     Allergies  Allergen Reactions  . Crestor [Rosuvastatin Calcium]     myalgias  . Pravastatin     myalgias  . Sulfonamide Derivatives Swelling    SWELLING REACTION UNSPECIFIED     Family History  Problem Relation Age of Onset  . Breast cancer Mother 65  . Hypertension Mother   . Cancer Mother   . Hyperlipidemia Mother   . Stroke Mother   . Other Father        'vocal dysautonomia'  . Breast cancer Sister 65  . Osteoarthritis Maternal Grandmother   . Colon cancer Neg Hx     BP 130/70 (BP Location: Left Arm, Patient Position: Sitting, Cuff Size: Normal)   Pulse 67   Ht 5'  5" (1.651 m)   Wt 133 lb (60.3 kg)   SpO2 95%   BMI 22.13 kg/m    Review of Systems She has slight headache, but no visual loss.  Denies diarrhea and skin rash.  Objective:   Physical Exam VITAL SIGNS:  See vs page GENERAL: no distress Neck: a healed scar is present.  I do not appreciate a nodule in the thyroid or elsewhere in the neck NEURO: slight tremor of the hands.    Lab Results  Component Value Date   PTH 34 02/04/2017   CALCIUM 10.2 12/10/2017   CAION 6.3 (H) 08/13/2015   Lab Results  Component Value Date   TSH 0.04 (L) 02/11/2018      Assessment & Plan:  Pituitary cyst, new to me.  We discussed.  She declines to recheck MRI.   Secondary hypothyroidism: overreplaced PTC: due for recheck.   Patient Instructions  blood tests are requested for you today.  We'll let you know about the results.  Please come back for a follow-up appointment in 6 months.

## 2018-02-15 ENCOUNTER — Other Ambulatory Visit: Payer: Self-pay

## 2018-02-16 LAB — PROLACTIN: Prolactin: 8.1 ng/mL

## 2018-02-16 LAB — THYROGLOBULIN LEVEL: Thyroglobulin: 0.1 ng/mL — ABNORMAL LOW

## 2018-02-16 LAB — PTH, INTACT AND CALCIUM
Calcium: 9.2 mg/dL (ref 8.6–10.4)
PTH: 23 pg/mL (ref 14–64)

## 2018-02-16 LAB — INSULIN-LIKE GROWTH FACTOR
IGF-I, LC/MS: 112 ng/mL (ref 41–279)
Z-Score (Female): -0.2 SD (ref ?–2.0)

## 2018-02-16 LAB — THYROGLOBULIN ANTIBODY: Thyroglobulin Ab: <1 IU/mL (ref <=1)

## 2018-02-16 LAB — TIQ-NTM

## 2018-02-17 ENCOUNTER — Other Ambulatory Visit: Payer: Self-pay | Admitting: Nurse Practitioner

## 2018-02-17 NOTE — Telephone Encounter (Signed)
 Database verified and compliance confirmed   Last Filled 01/18/2018

## 2018-03-03 ENCOUNTER — Other Ambulatory Visit: Payer: Self-pay | Admitting: Nurse Practitioner

## 2018-03-09 ENCOUNTER — Other Ambulatory Visit: Payer: Self-pay | Admitting: Infectious Diseases

## 2018-03-09 DIAGNOSIS — B20 Human immunodeficiency virus [HIV] disease: Secondary | ICD-10-CM

## 2018-03-18 ENCOUNTER — Other Ambulatory Visit (INDEPENDENT_AMBULATORY_CARE_PROVIDER_SITE_OTHER): Payer: Medicare Other

## 2018-03-18 ENCOUNTER — Other Ambulatory Visit: Payer: Self-pay

## 2018-03-18 DIAGNOSIS — D351 Benign neoplasm of parathyroid gland: Secondary | ICD-10-CM | POA: Diagnosis not present

## 2018-03-18 LAB — TSH: TSH: 1.15 u[IU]/mL (ref 0.35–4.50)

## 2018-03-19 ENCOUNTER — Other Ambulatory Visit: Payer: Self-pay | Admitting: Nurse Practitioner

## 2018-03-19 NOTE — Telephone Encounter (Signed)
 Database verified and compliance confirmed   Last filled 02/17/2018

## 2018-04-15 ENCOUNTER — Encounter: Payer: Self-pay | Admitting: Nurse Practitioner

## 2018-04-15 ENCOUNTER — Other Ambulatory Visit: Payer: Self-pay

## 2018-04-15 ENCOUNTER — Ambulatory Visit (INDEPENDENT_AMBULATORY_CARE_PROVIDER_SITE_OTHER): Payer: Medicare Other | Admitting: Nurse Practitioner

## 2018-04-15 DIAGNOSIS — I10 Essential (primary) hypertension: Secondary | ICD-10-CM

## 2018-04-15 DIAGNOSIS — F419 Anxiety disorder, unspecified: Secondary | ICD-10-CM

## 2018-04-15 DIAGNOSIS — E785 Hyperlipidemia, unspecified: Secondary | ICD-10-CM | POA: Diagnosis not present

## 2018-04-15 DIAGNOSIS — F329 Major depressive disorder, single episode, unspecified: Secondary | ICD-10-CM

## 2018-04-15 DIAGNOSIS — E039 Hypothyroidism, unspecified: Secondary | ICD-10-CM

## 2018-04-15 DIAGNOSIS — B20 Human immunodeficiency virus [HIV] disease: Secondary | ICD-10-CM

## 2018-04-15 DIAGNOSIS — M720 Palmar fascial fibromatosis [Dupuytren]: Secondary | ICD-10-CM

## 2018-04-15 DIAGNOSIS — F32A Depression, unspecified: Secondary | ICD-10-CM

## 2018-04-15 DIAGNOSIS — G8929 Other chronic pain: Secondary | ICD-10-CM

## 2018-04-15 DIAGNOSIS — D351 Benign neoplasm of parathyroid gland: Secondary | ICD-10-CM

## 2018-04-15 DIAGNOSIS — M2550 Pain in unspecified joint: Secondary | ICD-10-CM

## 2018-04-15 MED ORDER — DULOXETINE HCL 30 MG PO CPEP: 30.0000 mg | ORAL_CAPSULE | Freq: Every day | ORAL | 3 refills | Status: DC

## 2018-04-15 NOTE — Progress Notes (Addendum)
This service is provided via telemedicine  No vital signs collected/recorded due to the encounter was a telemedicine visit.   Location of patient (ex: home, work):  Home   Patient consents to a telephone visit:  Yes  Location of the provider (ex: office, home):  [employer], Office   Names of all persons participating in the telemedicine service and their role in the encounter: S.Chrae B/CMA, [name], NP, and Patient   Time spent on call:  5 min with medical assistant   Virtual Visit via Telephone Note  I connected with Laurie Gray on 04/15/18 at  2:15 PM EDT by telephone and verified that I am speaking with the correct person using two identifiers.   I discussed the limitations, risks, security and privacy concerns of performing an evaluation and management service by telephone and the availability of in person appointments. I also discussed with the patient that there may be a patient responsible charge related to this service. The patient expressed understanding and agreed to proceed.      Careteam: Patient Care Team: Lauree Chandler, NP as PCP - General (Geriatric Medicine) Campbell Riches, MD as PCP - Infectious Diseases (Infectious Diseases) Jackolyn Confer, MD as Consulting Physician (General Surgery)  Advanced Directive information Does Patient Have a Medical Advance Directive?: No, Would patient like information on creating a medical advance directive?: Yes (MAU/Ambulatory/Procedural Areas - Information given)(Paperwork given at previous visit )  Allergies  Allergen Reactions  . Crestor [Rosuvastatin Calcium]     myalgias  . Pravastatin     myalgias  . Sulfonamide Derivatives Swelling    SWELLING REACTION UNSPECIFIED     Chief Complaint  Patient presents with  . Medical Management of Chronic Issues    4 month follow-up. Tele-Visit      HPI: Patient is a 63 y.o. female for routine follow up.  Pt with hx of HIV, thyroid cancer s/p  thyroidectomy, anxiety and depression, chronic joint pain, hyperlipidemia.   Mother passed away with breast cancer and sister has breast cancer, saw genetic counseler and she tested negative.   Saw ophthalmologist since last follow up.   Parathyroid adenoma- following with endocrinologist states that she is not having any imagining at this time.  hypothyroid s/p thyroidectomy followed by endocrine, synthroid dose recently decreased to 50 mcg. Plans to follow up on labs.   Following with the hand specialist due to dupuytren contractures. Having increase in pain and plans to do surgery.    She plans to make appt with GYN, recent GYN retired. Hysterectomy due to fibroids. - still has not made this appt.   Bone density done 06/2016   HIV- following with Dr Johnnye Sima- viral load undetectable. Continues on odefsey   Anxiety- continues on Cymbalta 60 mg by mouth daily with good results. However looking to reduce cymbalta due to sweating.  Chronic pain- using tramadol and Cymbalta which has been beneficial. Reports cymbalta is making her sweat, she noticed this when she started.   htn- does check blood pressure at home but unsure the readings, states it was normal. ~120/70s. Taking olmesartan-hctz   Hyperlipidemia- continues on pravastatin for cholesterol.  Last LDL 135 in Dec 2019.  Attempts Heart healthy diet  Review of Systems:  Review of Systems  Constitutional: Positive for malaise/fatigue. Negative for chills and fever.  Respiratory: Negative for cough, sputum production and shortness of breath.   Cardiovascular: Negative for chest pain, palpitations and leg swelling.  Gastrointestinal: Negative for abdominal pain,  constipation, diarrhea and heartburn.  Genitourinary: Negative for dysuria, frequency and urgency.  Musculoskeletal: Positive for joint pain and myalgias. Negative for back pain and falls.  Skin: Negative.   Neurological: Positive for weakness. Negative for dizziness,  tingling and headaches.  Psychiatric/Behavioral: Negative for depression and memory loss. The patient is nervous/anxious. The patient does not have insomnia.     Past Medical History:  Diagnosis Date  . Anemia   . Anxiety    hx of-not on any meds  . Arthritis    "shoulders, arms, fingers, neck, back" (01/31/2016)  . Chronic cervical pain   . Family history of breast cancer   . History of bronchitis   . History of colon polyps   . History of kidney stones   . HIV infection ([place]) dx'd 2000   takes odefsey  . Hyperlipidemia    takes Pravastatin daily  . Hypertension    takes Benicar HCT daily  . Hyperthyroidism   . Hypothyroidism   . Internal hemorrhoids   . Joint pain   . Migraine    "in the past" (01/31/2016)  . Parathyroid adenoma   . Pituitary adenoma ([place])   . Refusal of blood transfusions as patient is Jehovah's Witness    "but substitutes are fine". (01/31/2016)  . Thyroid cancer ([place]) 12/2015  . Upper back pain    Past Surgical History:  Procedure Laterality Date  . ABDOMINAL HYSTERECTOMY    . BREAST EXCISIONAL BIOPSY Left    no scar seen   . BUNIONECTOMY Right   . COLONOSCOPY    . CYSTOSCOPY    . HAMMER TOE SURGERY Left    "middle toe was broke; turned into hammertoe then repaired"  . HAMMER TOE SURGERY Bilateral    "both small toes"  . PARATHYROIDECTOMY Right 01/31/2016  . PARATHYROIDECTOMY Right 01/31/2016   Procedure: PARATHYROIDECTOMY ON RIGHT SIDE;  Surgeon: Izora Gala, MD;  Location: [facility];  Service: ENT;  Laterality: Right;  . SALPINGOOPHORECTOMY Bilateral   . THYROIDECTOMY N/A 01/31/2016   Procedure: TOTAL THYROIDECTOMY;  Surgeon: Izora Gala, MD;  Location: [facility];  Service: ENT;  Laterality: N/A;  . TOE SURGERY     bunion  . TONSILLECTOMY     as a child  . TOTAL THYROIDECTOMY  01/31/2016  . TUBAL LIGATION     30 yrs ago   Social History:   reports that she has never smoked. She has never used smokeless tobacco. She reports current alcohol use.  She reports that she does not use drugs.  Family History  Problem Relation Age of Onset  . Breast cancer Mother 61  . Hypertension Mother   . Cancer Mother   . Hyperlipidemia Mother   . Stroke Mother   . Other Father        'vocal dysautonomia'  . Breast cancer Sister 13  . Osteoarthritis Maternal Grandmother   . Colon cancer Neg Hx     Medications: Patient's Medications  New Prescriptions   No medications on file  Previous Medications   CYANOCOBALAMIN (B-12 PO)    Take 1 tablet by mouth daily.   GLUCOSAMINE-CHONDROITIN 500-400 MG TABLET    Take 1 tablet by mouth 2 (two) times daily.   HYDROCORTISONE (PROCTO-PAK) 1 % CREA    Twice daily as needed for hemorrhoids   KRILL OIL 500 MG CAPS    Take by mouth daily.   LEVOTHYROXINE (SYNTHROID, LEVOTHROID) 50 MCG TABLET    Take 1 tablet (50 mcg total) by mouth  daily.   MULTIPLE VITAMINS-MINERALS (MULTIVITAMIN ADULTS 50+ PO)    Take 1 tablet by mouth daily.   ODEFSEY 200-25-25 MG TABS TABLET    TAKE 1 TABLET BY MOUTH EVERY DAY WITH BREAKFAST   OLMESARTAN-HYDROCHLOROTHIAZIDE (BENICAR HCT) 20-12.5 MG TABLET    TAKE 1 TABLET BY MOUTH DAILY.   PRAVASTATIN (PRAVACHOL) 40 MG TABLET    Take 1 tablet (40 mg total) by mouth daily.   TRAMADOL (ULTRAM) 50 MG TABLET    TAKE 1 TABLET BY MOUTH THREE TIMES A DAY AS NEEDED FOR PAIN  Modified Medications   Modified Medication Previous Medication   DULOXETINE (CYMBALTA) 30 MG CAPSULE DULoxetine (CYMBALTA) 60 MG capsule      Take 1 capsule (30 mg total) by mouth daily.    TAKE 1 CAPSULE BY MOUTH EVERY DAY  Discontinued Medications   MENTHOL-CAMPHOR (ICY HOT ADVANCED RELIEF) 16-11 % CREA    Apply 1 application topically 4 (four) times daily as needed (for shoulder pain).   OMEGA-3 ACID ETHYL ESTERS (LOVAZA) 1 G CAPSULE    TAKE 1 CAPSULE EVERY DAY     Physical Exam: unable due to tele-visit.   Labs reviewed: Basic Metabolic Panel: Recent Labs    06/11/17 1038 06/11/17 1514 08/06/17 1431  12/10/17 1521 02/11/18 1142 03/18/18 1519  NA 142 141  --  144  --   --   K 4.0 3.7  --  4.1  --   --   CL 103 102  --  105  --   --   CO2 30 29  --  32  --   --   GLUCOSE 87 85  --  94  --   --   BUN 10 11  --  18  --   --   CREATININE 0.89 0.91  --  1.13*  --   --   CALCIUM 9.7 9.4  --  10.2 9.2  --   TSH  --   --  0.07*  --  0.04* 1.15   Liver Function Tests: Recent Labs    04/29/17 0915 06/11/17 1038 12/10/17 1521  AST 19 20 23   ALT 18 20 18   BILITOT 0.4 0.5 0.3  PROT 6.5 7.0 6.7   No results for input(s): LIPASE, AMYLASE in the last 8760 hours. No results for input(s): AMMONIA in the last 8760 hours. CBC: Recent Labs    06/11/17 1038 12/10/17 1521  WBC 3.4* 3.8  HGB 12.6 12.1  HCT 37.6 35.9  MCV 92.4 91.3  PLT 209 213   Lipid Panel: Recent Labs    04/29/17 0915 06/11/17 1038 12/10/17 1521  CHOL 208* 236* 230*  HDL 65 70 70  LDLCALC 125* 150* 135*  TRIG 85 64 123  CHOLHDL 3.2 3.4 3.3   TSH: Recent Labs    08/06/17 1431 02/11/18 1142 03/18/18 1519  TSH 0.07* 0.04* 1.15   A1C: Lab Results  Component Value Date   HGBA1C 5.6 11/19/2015     Assessment/Plan 1. Anxiety and depression Stable, will reduce cymbalta at request of pt as she feels like she is having increase side effects sweating. To notify if anxiety and depression worsens.  - DULoxetine (CYMBALTA) 30 MG capsule; Take 1 capsule (30 mg total) by mouth daily.  Dispense: 30 capsule; Refill: 3  2. Chronic joint pain Stable, continues to use tramadol and Cymbalta with good relief. Will decrease cymbalta due to side effects of sweating. To notify if pain increases - DULoxetine (CYMBALTA) 30 MG  capsule; Take 1 capsule (30 mg total) by mouth daily.  Dispense: 30 capsule; Refill: 3  3. Hypothyroidism, unspecified type -TSH low 2 months ago and synthroid was reduced, recent TSH 4 weeks ago improved to 1.15  4. Hyperlipidemia, unspecified hyperlipidemia type LDL improved to 135 to continue  to work on heart health diet and pravastatin.   5. Essential hypertension, benign Reports she take blood pressure at home and unsure of exact reading but reports around 120/70s. Will continue current regimen.   6. Parathyroid adenoma Stable, ongoing follow up with endocrinology, has not had recent imagining.   7. Human immunodeficiency virus (HIV) disease ([place]) Stable, ongoing follow up with ID  8. Dupuytren contracture  Follow with hand specialist, plan to have procedure once COVID-19 pandemic improves.   Next appt: 08/19/2018 [employer]. Harle Battiest  [facility] [phone]   Follow Up Instructions:    I discussed the assessment and treatment plan with the patient. The patient was provided an opportunity to ask questions and all were answered. The patient agreed with the plan and demonstrated an understanding of the instructions.   The patient was advised to call back or seek an in-person evaluation if the symptoms worsen or if the condition fails to improve as anticipated.  I provided 16 minutes of non-face-to-face time during this encounter.   Lauree Chandler, NP  avs printed and mailed

## 2018-04-15 NOTE — Patient Instructions (Signed)
Reduce cymbalta to 30 mg daily - to notify us if pain, anxiety or depression worsens.   Continue heart healthy diet   Heart-Healthy Eating Plan Many factors influence your heart (coronary) health, including eating and exercise habits. Coronary risk increases with abnormal blood fat (lipid) levels. Heart-healthy meal planning includes limiting unhealthy fats, increasing healthy fats, and making other diet and lifestyle changes.  What are tips for following this plan? Cooking Cook foods using methods other than frying. Baking, boiling, grilling, and broiling are all good options. Other ways to reduce fat include:  Removing the skin from poultry.  Removing all visible fats from meats.  Steaming vegetables in water or broth. Meal planning   At meals, imagine dividing your plate into fourths: ? Fill one-half of your plate with vegetables and green salads. ? Fill one-fourth of your plate with whole grains. ? Fill one-fourth of your plate with lean protein foods.  Eat 4-5 servings of vegetables per day. One serving equals 1 cup raw or cooked vegetable, or 2 cups raw leafy greens.  Eat 4-5 servings of fruit per day. One serving equals 1 medium whole fruit,  cup dried fruit,  cup fresh, frozen, or canned fruit, or  cup 100% fruit juice.  Eat more foods that contain soluble fiber. Examples include apples, broccoli, carrots, beans, peas, and barley. Aim to get 25-30 g of fiber per day.  Increase your consumption of legumes, nuts, and seeds to 4-5 servings per week. One serving of dried beans or legumes equals  cup cooked, 1 serving of nuts is  cup, and 1 serving of seeds equals 1 tablespoon. Fats  Choose healthy fats more often. Choose monounsaturated and polyunsaturated fats, such as olive and canola oils, flaxseeds, walnuts, almonds, and seeds.  Eat more omega-3 fats. Choose salmon, mackerel, sardines, tuna, flaxseed oil, and ground flaxseeds. Aim to eat fish at least 2 times each  week.  Check food labels carefully to identify foods with trans fats or high amounts of saturated fat.  Limit saturated fats. These are found in animal products, such as meats, butter, and cream. Plant sources of saturated fats include palm oil, palm kernel oil, and coconut oil.  Avoid foods with partially hydrogenated oils in them. These contain trans fats. Examples are stick margarine, some tub margarines, cookies, crackers, and other baked goods.  Avoid fried foods. General information  Eat more home-cooked food and less restaurant, buffet, and fast food.  Limit or avoid alcohol.  Limit foods that are high in starch and sugar.  Lose weight if you are overweight. Losing just 5-10% of your body weight can help your overall health and prevent diseases such as diabetes and heart disease.  Monitor your salt (sodium) intake, especially if you have high blood pressure. Talk with your health care provider about your sodium intake.  Try to incorporate more vegetarian meals weekly. What foods can I eat? Fruits All fresh, canned (in natural juice), or frozen fruits. Vegetables Fresh or frozen vegetables (raw, steamed, roasted, or grilled). Green salads. Grains Most grains. Choose whole wheat and whole grains most of the time. Rice and pasta, including brown rice and pastas made with whole wheat. Meats and other proteins Lean, well-trimmed beef, veal, pork, and lamb. Chicken and [place] without skin. All fish and shellfish. Wild duck, rabbit, pheasant, and venison. Egg whites or low-cholesterol egg substitutes. Dried beans, peas, lentils, and tofu. Seeds and most nuts. Dairy Low-fat or nonfat cheeses, including ricotta and mozzarella. Skim or 1% milk (  liquid, powdered, or evaporated). Buttermilk made with low-fat milk. Nonfat or low-fat yogurt. Fats and oils Non-hydrogenated (trans-free) margarines. Vegetable oils, including soybean, sesame, sunflower, olive, peanut, safflower, corn, canola,  and cottonseed. Salad dressings or mayonnaise made with a vegetable oil. Beverages Water (mineral or sparkling). Coffee and tea. Diet carbonated beverages. Sweets and desserts Sherbet, gelatin, and fruit ice. Small amounts of dark chocolate. Limit all sweets and desserts. Seasonings and condiments All seasonings and condiments. The items listed above may not be a complete list of foods and beverages you can eat. Contact a dietitian for more options. What foods are not recommended? Fruits Canned fruit in heavy syrup. Fruit in cream or butter sauce. Fried fruit. Limit coconut. Vegetables Vegetables cooked in cheese, cream, or butter sauce. Fried vegetables. Grains Breads made with saturated or trans fats, oils, or whole milk. Croissants. Sweet rolls. Donuts. High-fat crackers, such as cheese crackers. Meats and other proteins Fatty meats, such as hot dogs, ribs, sausage, bacon, rib-eye roast or steak. High-fat deli meats, such as salami and bologna. Caviar. Domestic duck and goose. Organ meats, such as liver. Dairy Cream, sour cream, cream cheese, and creamed cottage cheese. Whole milk cheeses. Whole or 2% milk (liquid, evaporated, or condensed). Whole buttermilk. Cream sauce or high-fat cheese sauce. Whole-milk yogurt. Fats and oils Meat fat, or shortening. Cocoa butter, hydrogenated oils, palm oil, coconut oil, palm kernel oil. Solid fats and shortenings, including bacon fat, salt pork, lard, and butter. Nondairy cream substitutes. Salad dressings with cheese or sour cream. Beverages Regular sodas and any drinks with added sugar. Sweets and desserts Frosting. Pudding. Cookies. Cakes. Pies. Milk chocolate or white chocolate. Buttered syrups. Full-fat ice cream or ice cream drinks. The items listed above may not be a complete list of foods and beverages to avoid. Contact a dietitian for more information. Summary  Heart-healthy meal planning includes limiting unhealthy fats, increasing  healthy fats, and making other diet and lifestyle changes.  Lose weight if you are overweight. Losing just 5-10% of your body weight can help your overall health and prevent diseases such as diabetes and heart disease.  Focus on eating a balance of foods, including fruits and vegetables, low-fat or nonfat dairy, lean protein, nuts and legumes, whole grains, and heart-healthy oils and fats. This information is not intended to replace advice given to you by your health care provider. Make sure you discuss any questions you have with your health care provider. Document Released: 10/02/2007 Document Revised: 01/30/2017 Document Reviewed: 01/30/2017 Elsevier Interactive Patient Education  2019 [employer].

## 2018-04-19 ENCOUNTER — Other Ambulatory Visit: Payer: Self-pay | Admitting: Nurse Practitioner

## 2018-04-20 NOTE — Telephone Encounter (Signed)
Last filled 03/19/2018  [facility] Database verified and compliance confirmed

## 2018-05-13 ENCOUNTER — Other Ambulatory Visit: Payer: Self-pay | Admitting: Nurse Practitioner

## 2018-05-13 DIAGNOSIS — F32A Depression, unspecified: Secondary | ICD-10-CM

## 2018-05-13 DIAGNOSIS — G8929 Other chronic pain: Secondary | ICD-10-CM

## 2018-05-13 DIAGNOSIS — F329 Major depressive disorder, single episode, unspecified: Secondary | ICD-10-CM

## 2018-05-13 DIAGNOSIS — F419 Anxiety disorder, unspecified: Principal | ICD-10-CM

## 2018-05-13 DIAGNOSIS — M2550 Pain in unspecified joint: Secondary | ICD-10-CM

## 2018-05-13 NOTE — Telephone Encounter (Signed)
High risk warning populated when attempting to refill medication

## 2018-05-19 ENCOUNTER — Other Ambulatory Visit: Payer: Self-pay | Admitting: Nurse Practitioner

## 2018-05-20 NOTE — Telephone Encounter (Signed)
Last filled 04/20/2018  [facility] Database verified and compliance confirmed   

## 2018-06-07 ENCOUNTER — Other Ambulatory Visit: Payer: Self-pay | Admitting: Nurse Practitioner

## 2018-06-19 ENCOUNTER — Other Ambulatory Visit: Payer: Self-pay | Admitting: Nurse Practitioner

## 2018-07-20 ENCOUNTER — Other Ambulatory Visit: Payer: Self-pay | Admitting: Nurse Practitioner

## 2018-07-21 NOTE — Telephone Encounter (Signed)
Last filled in [facility] on 06/21/2018. RX Request sent to Marlowe Sax, NP to review [facility] Database and approve if necessary

## 2018-08-19 ENCOUNTER — Other Ambulatory Visit: Payer: Self-pay

## 2018-08-19 ENCOUNTER — Encounter: Payer: Self-pay | Admitting: Nurse Practitioner

## 2018-08-19 ENCOUNTER — Ambulatory Visit (INDEPENDENT_AMBULATORY_CARE_PROVIDER_SITE_OTHER): Payer: Medicare Other | Admitting: Nurse Practitioner

## 2018-08-19 VITALS — BP 118/80 | HR 72 | Temp 98.5°F | Ht 65.0 in | Wt 134.0 lb

## 2018-08-19 DIAGNOSIS — F419 Anxiety disorder, unspecified: Secondary | ICD-10-CM | POA: Diagnosis not present

## 2018-08-19 DIAGNOSIS — J383 Other diseases of vocal cords: Secondary | ICD-10-CM

## 2018-08-19 DIAGNOSIS — M2550 Pain in unspecified joint: Secondary | ICD-10-CM

## 2018-08-19 DIAGNOSIS — M8580 Other specified disorders of bone density and structure, unspecified site: Secondary | ICD-10-CM | POA: Diagnosis not present

## 2018-08-19 DIAGNOSIS — E2839 Other primary ovarian failure: Secondary | ICD-10-CM | POA: Diagnosis not present

## 2018-08-19 DIAGNOSIS — F329 Major depressive disorder, single episode, unspecified: Secondary | ICD-10-CM

## 2018-08-19 DIAGNOSIS — F32A Depression, unspecified: Secondary | ICD-10-CM

## 2018-08-19 DIAGNOSIS — E039 Hypothyroidism, unspecified: Secondary | ICD-10-CM

## 2018-08-19 DIAGNOSIS — G8929 Other chronic pain: Secondary | ICD-10-CM

## 2018-08-19 DIAGNOSIS — E785 Hyperlipidemia, unspecified: Secondary | ICD-10-CM

## 2018-08-19 DIAGNOSIS — M720 Palmar fascial fibromatosis [Dupuytren]: Secondary | ICD-10-CM | POA: Insufficient documentation

## 2018-08-19 DIAGNOSIS — I10 Essential (primary) hypertension: Secondary | ICD-10-CM

## 2018-08-19 NOTE — Progress Notes (Signed)
Careteam: Patient Care Team: Lauree Chandler, NP as PCP - General (Geriatric Medicine) Campbell Riches, MD as PCP - Infectious Diseases (Infectious Diseases) Jackolyn Confer, MD as Consulting Physician (General Surgery)  Advanced Directive information Does Patient Have a Medical Advance Directive?: No, Would patient like information on creating a medical advance directive?: No - Patient declined  Allergies  Allergen Reactions  . Crestor [Rosuvastatin Calcium]     myalgias  . Pravastatin     myalgias  . Sulfonamide Derivatives Swelling    SWELLING REACTION UNSPECIFIED     Chief Complaint  Patient presents with  . Medical Management of Chronic Issues    4 month follow up visit    HPI: Patient is a 64 y.o. female seen in the office today for routine follow up  Pt with hx of HIV, thyroid cancer s/pthyroidectomy, anxiety and depression, chronic joint pain, hyperlipidemia.     Parathyroid adenoma- following with endocrinologist states that she is not having any imagining at this time.  hypothyroid s/p thyroidectomy followed by endocrine, synthroid 50 mcg.   Following with the hand specialist due to dupuytren contractures. Having increase in pain and planed to do surgery but could not afford. She also needs to use her hands. "Down time would be too long".   Bone density done 06/2016   HIV- following with Dr Johnnye Sima- viral load undetectable. Continues on odefsey   Anxiety- continues on Cymbalta 30 mg by mouth daily with good results. However looking to reduce cymbalta due to sweating.  Chronic pain- using tramadol and Cymbalta 30 mg- reduced dose and doing okay with this. She has stopped following with rheumatologist, she continues on tramadol which controls symptoms.   htn- Taking olmesartan-hctz   Hyperlipidemia- continues on pravastatin for cholesterol.  Last LDL 135 in Dec 2019.  Attempts Heart healthy diet  Mother died from breast cancer- she had  genetic testing done which was negative.  Mother, sister and her all had parathyroid tumor and mother and sister both had breast cancer, she is up to date on mammogram and it was normal.   Review of Systems:  Review of Systems  Constitutional: Positive for malaise/fatigue. Negative for chills and fever.  Respiratory: Negative for cough, sputum production and shortness of breath.   Cardiovascular: Negative for chest pain, palpitations and leg swelling.  Gastrointestinal: Negative for abdominal pain, constipation, diarrhea and heartburn.  Genitourinary: Negative for dysuria, frequency and urgency.  Musculoskeletal: Positive for joint pain and myalgias. Negative for back pain and falls.  Skin: Negative.   Neurological: Positive for weakness. Negative for dizziness, tingling and headaches.  Psychiatric/Behavioral: Negative for depression and memory loss. The patient is nervous/anxious. The patient does not have insomnia.     Past Medical History:  Diagnosis Date  . Anemia   . Anxiety    hx of-not on any meds  . Arthritis    "shoulders, arms, fingers, neck, back" (01/31/2016)  . Chronic cervical pain   . Family history of breast cancer   . History of bronchitis   . History of colon polyps   . History of kidney stones   . HIV infection ([place]) dx'd 2000   takes odefsey  . Hyperlipidemia    takes Pravastatin daily  . Hypertension    takes Benicar HCT daily  . Hyperthyroidism   . Hypothyroidism   . Internal hemorrhoids   . Joint pain   . Migraine    "in the past" (01/31/2016)  . Parathyroid adenoma   .  Pituitary adenoma ([place])   . Refusal of blood transfusions as patient is Jehovah's Witness    "but substitutes are fine". (01/31/2016)  . Thyroid cancer ([place]) 12/2015  . Upper back pain    Past Surgical History:  Procedure Laterality Date  . ABDOMINAL HYSTERECTOMY    . BREAST EXCISIONAL BIOPSY Left    no scar seen   . BUNIONECTOMY Right   . COLONOSCOPY    . CYSTOSCOPY    .  HAMMER TOE SURGERY Left    "middle toe was broke; turned into hammertoe then repaired"  . HAMMER TOE SURGERY Bilateral    "both small toes"  . PARATHYROIDECTOMY Right 01/31/2016  . PARATHYROIDECTOMY Right 01/31/2016   Procedure: PARATHYROIDECTOMY ON RIGHT SIDE;  Surgeon: Izora Gala, MD;  Location: [facility];  Service: ENT;  Laterality: Right;  . SALPINGOOPHORECTOMY Bilateral   . THYROIDECTOMY N/A 01/31/2016   Procedure: TOTAL THYROIDECTOMY;  Surgeon: Izora Gala, MD;  Location: [facility];  Service: ENT;  Laterality: N/A;  . TOE SURGERY     bunion  . TONSILLECTOMY     as a child  . TOTAL THYROIDECTOMY  01/31/2016  . TUBAL LIGATION     30 yrs ago   Social History:   reports that she has never smoked. She has never used smokeless tobacco. She reports current alcohol use. She reports that she does not use drugs.  Family History  Problem Relation Age of Onset  . Breast cancer Mother 66  . Hypertension Mother   . Cancer Mother   . Hyperlipidemia Mother   . Stroke Mother   . Other Father        'vocal dysautonomia'  . Breast cancer Sister 19  . Osteoarthritis Maternal Grandmother   . Colon cancer Neg Hx     Medications: Patient's Medications  New Prescriptions   No medications on file  Previous Medications   CYANOCOBALAMIN (B-12 PO)    Take 1 tablet by mouth daily.   DULOXETINE (CYMBALTA) 30 MG CAPSULE    TAKE 1 CAPSULE BY MOUTH EVERY DAY   GLUCOSAMINE-CHONDROITIN 500-400 MG TABLET    Take 1 tablet by mouth 2 (two) times daily.   HYDROCORTISONE (PROCTO-PAK) 1 % CREA    Twice daily as needed for hemorrhoids   KRILL OIL 500 MG CAPS    Take by mouth daily.   LEVOTHYROXINE (SYNTHROID, LEVOTHROID) 50 MCG TABLET    Take 1 tablet (50 mcg total) by mouth daily.   MULTIPLE VITAMINS-MINERALS (MULTIVITAMIN ADULTS 50+ PO)    Take 1 tablet by mouth daily.   ODEFSEY 200-25-25 MG TABS TABLET    TAKE 1 TABLET BY MOUTH EVERY DAY WITH BREAKFAST   OLMESARTAN-HYDROCHLOROTHIAZIDE (BENICAR HCT) 20-12.5 MG  TABLET    TAKE 1 TABLET BY MOUTH DAILY.   PRAVASTATIN (PRAVACHOL) 40 MG TABLET    TAKE 1 TABLET BY MOUTH EVERY DAY   TRAMADOL (ULTRAM) 50 MG TABLET    TAKE 1 TABLET BY MOUTH THREE TIMES A DAY AS NEEDED FOR PAIN  Modified Medications   No medications on file  Discontinued Medications   No medications on file    Physical Exam:  Vitals:   08/19/18 1409  BP: 118/80  Pulse: 72  Temp: 98.5 F (36.9 C)  TempSrc: Oral  SpO2: 95%  Weight: 134 lb (60.8 kg)  Height: 5\' 5"  (1.651 m)   Body mass index is 22.3 kg/m. Wt Readings from Last 3 Encounters:  08/19/18 134 lb (60.8 kg)  02/11/18 133 lb (60.3  kg)  01/28/18 131 lb (59.4 kg)    Physical Exam Constitutional:      Appearance: Normal appearance. She is well-developed.  HENT:     Head: Normocephalic and atraumatic.     Mouth/Throat:     Pharynx: No oropharyngeal exudate.  Eyes:     General: No scleral icterus.    Pupils: Pupils are equal, round, and reactive to light.  Neck:     Musculoskeletal: Neck supple.     Vascular: No carotid bruit.     Trachea: No tracheal deviation.  Cardiovascular:     Rate and Rhythm: Normal rate and regular rhythm.     Heart sounds: Normal heart sounds.  Pulmonary:     Effort: Pulmonary effort is normal.     Breath sounds: Normal breath sounds. No stridor.  Abdominal:     General: Bowel sounds are normal. There is no distension.     Palpations: Abdomen is soft. Abdomen is not rigid. There is no hepatomegaly or mass.     Tenderness: There is no abdominal tenderness. There is no guarding.  Musculoskeletal:        General: Tenderness present.  Lymphadenopathy:     Cervical: No cervical adenopathy.  Skin:    General: Skin is warm and dry.     Findings: No rash.  Neurological:     Mental Status: She is alert and oriented to person, place, and time.     Motor: Abnormal muscle tone present.  Psychiatric:        Behavior: Behavior normal.        Thought Content: Thought content normal.         Judgment: Judgment normal.     Labs reviewed: Basic Metabolic Panel: Recent Labs    12/10/17 1521 02/11/18 1142 03/18/18 1519  NA 144  --   --   K 4.1  --   --   CL 105  --   --   CO2 32  --   --   GLUCOSE 94  --   --   BUN 18  --   --   CREATININE 1.13*  --   --   CALCIUM 10.2 9.2  --   TSH  --  0.04* 1.15   Liver Function Tests: Recent Labs    12/10/17 1521  AST 23  ALT 18  BILITOT 0.3  PROT 6.7   No results for input(s): LIPASE, AMYLASE in the last 8760 hours. No results for input(s): AMMONIA in the last 8760 hours. CBC: Recent Labs    12/10/17 1521  WBC 3.8  HGB 12.1  HCT 35.9  MCV 91.3  PLT 213   Lipid Panel: Recent Labs    12/10/17 1521  CHOL 230*  HDL 70  LDLCALC 135*  TRIG 123  CHOLHDL 3.3   TSH: Recent Labs    02/11/18 1142 03/18/18 1519  TSH 0.04* 1.15   A1C: Lab Results  Component Value Date   HGBA1C 5.6 11/19/2015     Assessment/Plan 1. Osteopenia, unspecified location Continues on vit d and weight bearing activities.  - DG Bone Density; Future  2. Estrogen deficiency - DG Bone Density; Future  3. Chronic joint pain Stable, continues on cymbalta 30 mg daily and tramadol PRN  4. Anxiety and depression Controlled on cymbalta 30 mg daily   5. Hypothyroidism, unspecified type TSH stable in March, continues on synthroid 50 mcg.  6. Hyperlipidemia, unspecified hyperlipidemia type Continues on pravastatin, lipids scheduled to be follow up with  ID at next visit. - COMPLETE METABOLIC PANEL WITH GFR  7. Essential hypertension, benign -controlled on current regimen.  - COMPLETE METABOLIC PANEL WITH GFR - CBC with Differential/Platelet  8. Dupuytren's contracture of both hands Ongoing contractures, following with ortho but can not afford to be without her hands so has opted not to get surgery.  9. Spastic dysphonia Has had treatment of Botox before, but she did not like it because it made her voice very faint and did not  like the results.   Next appt: 6 months.  Carlos American. [place], [facility] [phone]

## 2018-08-20 LAB — COMPLETE METABOLIC PANEL WITH GFR
AG Ratio: 1.8 (calc) (ref 1.0–2.5)
ALT: 18 U/L (ref 6–29)
AST: 19 U/L (ref 10–35)
Albumin: 4.4 g/dL (ref 3.6–5.1)
Alkaline phosphatase (APISO): 53 U/L (ref 37–153)
BUN: 15 mg/dL (ref 7–25)
CO2: 31 mmol/L (ref 20–32)
Calcium: 9.8 mg/dL (ref 8.6–10.4)
Chloride: 103 mmol/L (ref 98–110)
Creat: 0.89 mg/dL (ref 0.50–0.99)
GFR, Est African American: 81 mL/min/{1.73_m2} (ref >=60)
GFR, Est Non African American: 69 mL/min/{1.73_m2} (ref >=60)
Globulin: 2.4 g/dL (calc) (ref 1.9–3.7)
Glucose, Bld: 100 mg/dL (ref 65–139)
Potassium: 3.7 mmol/L (ref 3.5–5.3)
Sodium: 142 mmol/L (ref 135–146)
Total Bilirubin: 0.3 mg/dL (ref 0.2–1.2)
Total Protein: 6.8 g/dL (ref 6.1–8.1)

## 2018-08-20 LAB — CBC WITH DIFFERENTIAL/PLATELET
Absolute Monocytes: 289 cells/uL (ref 200–950)
Basophils Absolute: 38 cells/uL (ref 0–200)
Basophils Relative: 1 %
Eosinophils Absolute: 118 cells/uL (ref 15–500)
Eosinophils Relative: 3.1 %
HCT: 36.6 % (ref 35.0–45.0)
Hemoglobin: 12.1 g/dL (ref 11.7–15.5)
Lymphs Abs: 1455 cells/uL (ref 850–3900)
MCH: 31.8 pg (ref 27.0–33.0)
MCHC: 33.1 g/dL (ref 32.0–36.0)
MCV: 96.1 fL (ref 80.0–100.0)
MPV: 11.6 fL (ref 7.5–12.5)
Monocytes Relative: 7.6 %
Neutro Abs: 1900 cells/uL (ref 1500–7800)
Neutrophils Relative %: 50 %
Platelets: 192 10*3/uL (ref 140–400)
RBC: 3.81 10*6/uL (ref 3.80–5.10)
RDW: 13.3 % (ref 11.0–15.0)
Total Lymphocyte: 38.3 %
WBC: 3.8 10*3/uL (ref 3.8–10.8)

## 2018-08-22 ENCOUNTER — Other Ambulatory Visit: Payer: Self-pay | Admitting: Nurse Practitioner

## 2018-08-23 NOTE — Telephone Encounter (Signed)
Last filled 07/21/2018 in [facility], RX request sent to Lauree Chandler, NP to review and approve if necessary

## 2018-08-27 ENCOUNTER — Other Ambulatory Visit: Payer: Self-pay | Admitting: Nurse Practitioner

## 2018-08-28 ENCOUNTER — Other Ambulatory Visit: Payer: Self-pay | Admitting: Infectious Diseases

## 2018-08-28 DIAGNOSIS — B20 Human immunodeficiency virus [HIV] disease: Secondary | ICD-10-CM

## 2018-08-31 ENCOUNTER — Other Ambulatory Visit: Payer: Self-pay | Admitting: Nurse Practitioner

## 2018-08-31 NOTE — Telephone Encounter (Signed)
High Allergy Alert routing refill to provider for approval

## 2018-09-20 ENCOUNTER — Other Ambulatory Visit: Payer: Self-pay | Admitting: Nurse Practitioner

## 2018-09-20 DIAGNOSIS — F329 Major depressive disorder, single episode, unspecified: Secondary | ICD-10-CM

## 2018-09-20 DIAGNOSIS — G8929 Other chronic pain: Secondary | ICD-10-CM

## 2018-09-20 DIAGNOSIS — M2550 Pain in unspecified joint: Secondary | ICD-10-CM

## 2018-09-20 DIAGNOSIS — F32A Depression, unspecified: Secondary | ICD-10-CM

## 2018-09-20 DIAGNOSIS — F419 Anxiety disorder, unspecified: Secondary | ICD-10-CM

## 2018-09-30 ENCOUNTER — Other Ambulatory Visit: Payer: Self-pay | Admitting: Family Medicine

## 2018-09-30 ENCOUNTER — Other Ambulatory Visit (HOSPITAL_COMMUNITY)
Admission: RE | Admit: 2018-09-30 | Discharge: 2018-09-30 | Disposition: A | Discharge status: Home / Self Care | Payer: Medicare Other | Source: Ambulatory Visit | Attending: Infectious Diseases | Admitting: Infectious Diseases

## 2018-09-30 ENCOUNTER — Other Ambulatory Visit: Payer: Medicare Other

## 2018-09-30 ENCOUNTER — Other Ambulatory Visit: Payer: Self-pay

## 2018-09-30 DIAGNOSIS — Z79899 Other long term (current) drug therapy: Secondary | ICD-10-CM

## 2018-09-30 DIAGNOSIS — B20 Human immunodeficiency virus [HIV] disease: Secondary | ICD-10-CM

## 2018-09-30 DIAGNOSIS — Z113 Encounter for screening for infections with a predominantly sexual mode of transmission: Secondary | ICD-10-CM | POA: Diagnosis present

## 2018-09-30 NOTE — Addendum Note (Signed)
Addended by: Dolan Amen D on: 09/30/2018 02:13 PM   Modules accepted: Orders

## 2018-10-01 LAB — URINE CYTOLOGY ANCILLARY ONLY
Chlamydia: NEGATIVE
Neisseria Gonorrhea: NEGATIVE

## 2018-10-01 LAB — T-HELPER CELL (CD4) - (RCID CLINIC ONLY)
CD4 % Helper T Cell: 31 % — ABNORMAL LOW (ref 33–65)
CD4 T Cell Abs: 621 /uL (ref 400–1790)

## 2018-10-06 LAB — COMPREHENSIVE METABOLIC PANEL
AG Ratio: 2 (calc) (ref 1.0–2.5)
ALT: 16 U/L (ref 6–29)
AST: 20 U/L (ref 10–35)
Albumin: 4.5 g/dL (ref 3.6–5.1)
Alkaline phosphatase (APISO): 49 U/L (ref 37–153)
BUN/Creatinine Ratio: 11 (calc) (ref 6–22)
BUN: 11 mg/dL (ref 7–25)
CO2: 28 mmol/L (ref 20–32)
Calcium: 9.6 mg/dL (ref 8.6–10.4)
Chloride: 105 mmol/L (ref 98–110)
Creat: 1.03 mg/dL — ABNORMAL HIGH (ref 0.50–0.99)
Globulin: 2.3 g/dL (calc) (ref 1.9–3.7)
Glucose, Bld: 76 mg/dL (ref 65–99)
Potassium: 4.1 mmol/L (ref 3.5–5.3)
Sodium: 143 mmol/L (ref 135–146)
Total Bilirubin: 0.4 mg/dL (ref 0.2–1.2)
Total Protein: 6.8 g/dL (ref 6.1–8.1)

## 2018-10-06 LAB — CBC
HCT: 36.8 % (ref 35.0–45.0)
Hemoglobin: 12.1 g/dL (ref 11.7–15.5)
MCH: 31.1 pg (ref 27.0–33.0)
MCHC: 32.9 g/dL (ref 32.0–36.0)
MCV: 94.6 fL (ref 80.0–100.0)
MPV: 11.5 fL (ref 7.5–12.5)
Platelets: 220 10*3/uL (ref 140–400)
RBC: 3.89 10*6/uL (ref 3.80–5.10)
RDW: 13.4 % (ref 11.0–15.0)
WBC: 4.7 10*3/uL (ref 3.8–10.8)

## 2018-10-06 LAB — LIPID PANEL
Cholesterol: 246 mg/dL — ABNORMAL HIGH (ref <200)
HDL: 75 mg/dL (ref >=50)
LDL Cholesterol (Calc): 148 mg/dL (calc) — ABNORMAL HIGH
Non-HDL Cholesterol (Calc): 171 mg/dL (calc) — ABNORMAL HIGH (ref <130)
Total CHOL/HDL Ratio: 3.3 (calc) (ref <5.0)
Triglycerides: 117 mg/dL (ref <150)

## 2018-10-06 LAB — HIV-1 RNA QUANT-NO REFLEX-BLD
HIV 1 RNA Quant: <20 copies/mL
HIV-1 RNA Quant, Log: <1.3 Log copies/mL

## 2018-10-06 LAB — RPR: RPR Ser Ql: NONREACTIVE

## 2018-10-12 ENCOUNTER — Other Ambulatory Visit: Payer: Self-pay

## 2018-10-13 ENCOUNTER — Encounter: Payer: Self-pay | Admitting: Endocrinology

## 2018-10-13 ENCOUNTER — Ambulatory Visit (INDEPENDENT_AMBULATORY_CARE_PROVIDER_SITE_OTHER): Payer: Medicare Other | Admitting: Endocrinology

## 2018-10-13 VITALS — BP 110/70 | HR 69 | Ht 65.0 in | Wt 133.8 lb

## 2018-10-13 DIAGNOSIS — E213 Hyperparathyroidism, unspecified: Secondary | ICD-10-CM | POA: Diagnosis not present

## 2018-10-13 DIAGNOSIS — D351 Benign neoplasm of parathyroid gland: Secondary | ICD-10-CM | POA: Diagnosis not present

## 2018-10-13 DIAGNOSIS — D353 Benign neoplasm of craniopharyngeal duct: Secondary | ICD-10-CM

## 2018-10-13 DIAGNOSIS — C73 Malignant neoplasm of thyroid gland: Secondary | ICD-10-CM | POA: Diagnosis not present

## 2018-10-13 DIAGNOSIS — E890 Postprocedural hypothyroidism: Secondary | ICD-10-CM

## 2018-10-13 DIAGNOSIS — Z23 Encounter for immunization: Secondary | ICD-10-CM

## 2018-10-13 DIAGNOSIS — D352 Benign neoplasm of pituitary gland: Secondary | ICD-10-CM

## 2018-10-13 LAB — TSH: TSH: 34.85 u[IU]/mL — ABNORMAL HIGH (ref 0.35–4.50)

## 2018-10-13 LAB — T4, FREE: Free T4: 0.65 ng/dL (ref 0.60–1.60)

## 2018-10-13 NOTE — Patient Instructions (Addendum)
Blood tests are requested for you today.  We'll let you know about the results.   Let's recheck the MRI.  you will receive a phone call, about a day and time for an appointment.   Please come back for a follow-up appointment in 6 months.   

## 2018-10-13 NOTE — Progress Notes (Signed)
Subjective:    Patient ID: Laurie Gray, female    DOB: Jan 20, 1955, 63 y.o.   MRN: [id number]  HPI Pt returns for f/u stage 1 of differentiated thyroid cancer, with this chronology: 1/18: thyroidectomy: PAPILLARY CARCINOMA, 0.9 CM. - FOCAL MICROSCOPIC EXTRATHYROIDAL EXTENSION PRESENT; INVOLVES RESECTION MARGIN.   3/18: RAI with thyrogen, 109 mCi 3/18: post-therapy scan: residual uptake in the thyroid bed. No evidence for distant metastatic disease.   1/19: TG=0.1 (Ab neg) 2/20: TG=0.1 (Ab neg) Pt also has pituitary cyst (dx'ed 2005, when MRI showed 3.5 x 6.5 mm cyst; f/u in 2010 was unchanged).   She has moderate fatigue, and assoc hot/cold intolerance.  Denies neck pain or swelling. Past Medical History:  Diagnosis Date  . Anemia   . Anxiety    hx of-not on any meds  . Arthritis    "shoulders, arms, fingers, neck, back" (01/31/2016)  . Chronic cervical pain   . Family history of breast cancer   . History of bronchitis   . History of colon polyps   . History of kidney stones   . HIV infection ([place]) dx'd 2000   takes odefsey  . Hyperlipidemia    takes Pravastatin daily  . Hypertension    takes Benicar HCT daily  . Hyperthyroidism   . Hypothyroidism   . Internal hemorrhoids   . Joint pain   . Migraine    "in the past" (01/31/2016)  . Parathyroid adenoma   . Pituitary adenoma ([place])   . Refusal of blood transfusions as patient is Jehovah's Witness    "but substitutes are fine". (01/31/2016)  . Thyroid cancer ([place]) 12/2015  . Upper back pain     Past Surgical History:  Procedure Laterality Date  . ABDOMINAL HYSTERECTOMY    . BREAST EXCISIONAL BIOPSY Left    no scar seen   . BUNIONECTOMY Right   . COLONOSCOPY    . CYSTOSCOPY    . HAMMER TOE SURGERY Left    "middle toe was broke; turned into hammertoe then repaired"  . HAMMER TOE SURGERY Bilateral    "both small toes"  . PARATHYROIDECTOMY Right 01/31/2016  . PARATHYROIDECTOMY Right 01/31/2016   Procedure:  PARATHYROIDECTOMY ON RIGHT SIDE;  Surgeon: Izora Gala, MD;  Location: [facility];  Service: ENT;  Laterality: Right;  . SALPINGOOPHORECTOMY Bilateral   . THYROIDECTOMY N/A 01/31/2016   Procedure: TOTAL THYROIDECTOMY;  Surgeon: Izora Gala, MD;  Location: [facility];  Service: ENT;  Laterality: N/A;  . TOE SURGERY     bunion  . TONSILLECTOMY     as a child  . TOTAL THYROIDECTOMY  01/31/2016  . TUBAL LIGATION     30 yrs ago    Social History   Socioeconomic History  . Marital status: Widowed    Spouse name: Not on file  . Number of children: Not on file  . Years of education: Not on file  . Highest education level: Not on file  Occupational History  . Not on file  Social Needs  . Financial resource strain: Not hard at all  . Food insecurity    Worry: Never true    Inability: Never true  . Transportation needs    Medical: No    Non-medical: No  Tobacco Use  . Smoking status: Never Smoker  . Smokeless tobacco: Never Used  Substance and Sexual Activity  . Alcohol use: Yes    Alcohol/week: 0.0 standard drinks    Comment: 01/31/2016 "might go out to eat and  have a drink once/month; no more than that"  . Drug use: No  . Sexual activity: Not Currently    Partners: Male    Comment: declined condoms  Lifestyle  . Physical activity    Days per week: 0 days    Minutes per session: 0 min  . Stress: Only a little  Relationships  . Social connections    Talks on phone: More than three times a week    Gets together: More than three times a week    Attends religious service: More than 4 times per year    Active member of club or organization: No    Attends meetings of clubs or organizations: Never    Relationship status: Widowed  . Intimate partner violence    Fear of current or ex partner: No    Emotionally abused: No    Physically abused: No    Forced sexual activity: No  Other Topics Concern  . Not on file  Social History Narrative   Normal diet   Drinks Caffeine-  coffee/coke/chocolate    Lives in an apartment/ third level/1 person/1 pet   Past profession- Museum/gallery exhibitions officer, cosmetologist   Exercises 3-4 x weekly (cardio and weights)        Current Outpatient Medications on File Prior to Visit  Medication Sig Dispense Refill  . Cyanocobalamin (B-12 PO) Take 1 tablet by mouth daily.    . DULoxetine (CYMBALTA) 30 MG capsule TAKE 1 CAPSULE BY MOUTH EVERY DAY 90 capsule 1  . glucosamine-chondroitin 500-400 MG tablet Take 1 tablet by mouth 2 (two) times daily.    . hydrocortisone (PROCTO-PAK) 1 % CREA Twice daily as needed for hemorrhoids 28.35 g 1  . Krill Oil 500 MG CAPS Take by mouth daily.    . Multiple Vitamins-Minerals (MULTIVITAMIN ADULTS 50+ PO) Take 1 tablet by mouth daily.    . ODEFSEY 200-25-25 MG TABS tablet TAKE 1 TABLET BY MOUTH EVERY DAY WITH BREAKFAST 90 tablet 1  . olmesartan-hydrochlorothiazide (BENICAR HCT) 20-12.5 MG tablet TAKE 1 TABLET BY MOUTH DAILY. 90 tablet 1  . pravastatin (PRAVACHOL) 40 MG tablet TAKE 1 TABLET BY MOUTH EVERY DAY 90 tablet 1  . traMADol (ULTRAM) 50 MG tablet TAKE 1 TABLET BY MOUTH 3 TIMES A DAY AS NEEDED FOR PAIN 90 tablet 0   No current facility-administered medications on file prior to visit.     Allergies  Allergen Reactions  . Crestor [Rosuvastatin Calcium]     myalgias  . Pravastatin     myalgias  . Sulfonamide Derivatives Swelling    SWELLING REACTION UNSPECIFIED     Family History  Problem Relation Age of Onset  . Breast cancer Mother 61  . Hypertension Mother   . Cancer Mother   . Hyperlipidemia Mother   . Stroke Mother   . Other Father        'vocal dysautonomia'  . Breast cancer Sister 12  . Osteoarthritis Maternal Grandmother   . Colon cancer Neg Hx     BP 110/70 (BP Location: Left Arm, Patient Position: Sitting, Cuff Size: Normal)   Pulse 69   Ht '5\' 5"'  (1.651 m)   Wt 133 lb 12.8 oz (60.7 kg)   SpO2 95%   BMI 22.27 kg/m    Review of Systems She has slight headache.  No change  in chronic depression.      Objective:   Physical Exam VITAL SIGNS:  See vs page.  GENERAL: no distress.  Neck: a healed scar  is present.  I do not appreciate a nodule in the thyroid or elsewhere in the neck.    Lab Results  Component Value Date   TSH 34.85 (H) 10/13/2018      Assessment & Plan:  Pituitary cyst, due for recheck PTC: recheck today Hypothyroidism: worse.  I have sent a prescription to your pharmacy, to increase synthroid.  Patient Instructions  Blood tests are requested for you today.  We'll let you know about the results.  Let's recheck the MRI.  you will receive a phone call, about a day and time for an appointment. Please come back for a follow-up appointment in 6 months.

## 2018-10-14 LAB — THYROGLOBULIN LEVEL: Thyroglobulin: 0.1 ng/mL — ABNORMAL LOW

## 2018-10-14 LAB — THYROGLOBULIN ANTIBODY: Thyroglobulin Ab: <1 IU/mL (ref <=1)

## 2018-10-14 MED ORDER — LEVOTHYROXINE SODIUM 100 MCG PO TABS: 100.0000 ug | ORAL_TABLET | Freq: Every day | ORAL | 3 refills | Status: DC

## 2018-10-15 ENCOUNTER — Other Ambulatory Visit: Payer: Self-pay | Admitting: Nurse Practitioner

## 2018-10-18 ENCOUNTER — Other Ambulatory Visit: Payer: Self-pay | Admitting: Nurse Practitioner

## 2018-10-19 NOTE — Telephone Encounter (Signed)
Last filled on 09/20/2018 in [facility]

## 2018-10-22 ENCOUNTER — Encounter: Payer: Medicare Other | Admitting: Infectious Diseases

## 2018-10-25 ENCOUNTER — Encounter: Payer: Self-pay | Admitting: Infectious Diseases

## 2018-10-25 ENCOUNTER — Other Ambulatory Visit: Payer: Self-pay

## 2018-10-25 ENCOUNTER — Ambulatory Visit (INDEPENDENT_AMBULATORY_CARE_PROVIDER_SITE_OTHER): Payer: Medicare Other | Admitting: Infectious Diseases

## 2018-10-25 VITALS — BP 113/75 | HR 72 | Temp 98.6°F | Wt 134.0 lb

## 2018-10-25 DIAGNOSIS — C73 Malignant neoplasm of thyroid gland: Secondary | ICD-10-CM

## 2018-10-25 DIAGNOSIS — F331 Major depressive disorder, recurrent, moderate: Secondary | ICD-10-CM

## 2018-10-25 DIAGNOSIS — D352 Benign neoplasm of pituitary gland: Secondary | ICD-10-CM | POA: Diagnosis not present

## 2018-10-25 DIAGNOSIS — B20 Human immunodeficiency virus [HIV] disease: Secondary | ICD-10-CM

## 2018-10-25 DIAGNOSIS — Z79899 Other long term (current) drug therapy: Secondary | ICD-10-CM

## 2018-10-25 DIAGNOSIS — M720 Palmar fascial fibromatosis [Dupuytren]: Secondary | ICD-10-CM

## 2018-10-25 DIAGNOSIS — D353 Benign neoplasm of craniopharyngeal duct: Secondary | ICD-10-CM

## 2018-10-25 DIAGNOSIS — Z803 Family history of malignant neoplasm of breast: Secondary | ICD-10-CM

## 2018-10-25 DIAGNOSIS — I10 Essential (primary) hypertension: Secondary | ICD-10-CM | POA: Diagnosis not present

## 2018-10-25 DIAGNOSIS — Z113 Encounter for screening for infections with a predominantly sexual mode of transmission: Secondary | ICD-10-CM

## 2018-10-25 DIAGNOSIS — K0889 Other specified disorders of teeth and supporting structures: Secondary | ICD-10-CM

## 2018-10-25 DIAGNOSIS — E785 Hyperlipidemia, unspecified: Secondary | ICD-10-CM

## 2018-10-25 NOTE — Assessment & Plan Note (Signed)
On statin.  Still some room to improve, change to injection? Atorvastatin? Defer to endo

## 2018-10-25 NOTE — Assessment & Plan Note (Signed)
I encouraged her to f/u with her hand surgeon.

## 2018-10-25 NOTE — Assessment & Plan Note (Signed)
Well controlled, no sx.  Cont ARB-HCTZ.

## 2018-10-25 NOTE — Assessment & Plan Note (Signed)
gettting new partial from her dentist.

## 2018-10-25 NOTE — Assessment & Plan Note (Signed)
She is on SSRI.  She does not want to see our counselor today.

## 2018-10-25 NOTE — Assessment & Plan Note (Signed)
Her most recent u/s showed resolution of cyst.  She is going to stay viginlant about having repeat scans.  She has mother and sister both with breast cancer.

## 2018-10-25 NOTE — Assessment & Plan Note (Signed)
She is doing well Has had flu shot at endo office last week.  Her labs, CD4 and HIV RNA are great.  She is to get her second shingles vax now.  Offered/refused condoms.  rtc in 9 months, labs prior.

## 2018-10-25 NOTE — Progress Notes (Signed)
Subjective:    Patient ID: [name], female    DOB: 19-Mar-1955, 63 y.o.   MRN: [id number]  HPI 63yo F with hx of dx 2000 HIV+, depression, hyperlipidemia, prev lap hysterecomy-BSO (2009). 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   Has had f/u with GYN, nl PAP 05-2016. Has had previous abn on colpo (SIL).  She had mammo 02-2017 which showed R cysts. U/s 08-2017:  Six-month follow-up bilateral diagnostic mammogram and right breast ultrasound is recommended to continue follow-up of the probably benign mass at 2 o'clock, 3 cm from the nipple. U/s 02-2018: Resolution of the previously seen probably benign mass at 2 o'clock 1 cm from the nipple. Persistent cyst at 2 o'clock, 3 cm from nipple benign in appearance. No evidence of malignancy in either breast. RECOMMENDATION: Annual screening mammography.  Had colon 2013.Had stool test (-) x 2 this 2019.   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  She also got radiocative Iodine She is worried today as she has had difficulty getting her levels right and having sx from this. Has f/u in 2 months.   Today she is "depressed"- due to her hands (dupteryn's contractures). Sleeping poorly.  Was seen by by hand surgery and was to have 2 fingers straightened. She wants them ALL straightened. She is considering going back when ok (due to [place]). Wants to start exercising more.  Frustrated as she wants to spend time with her grandkids.  She is getting a bridge made via dental [facility]emat[facility]).    HIV 1 RNA Quant (copies/mL)  Date Value  09/30/2018 <20 NOT DETECTED  12/10/2017 <20 NOT DETECTED  06/11/2017 <20 NOT DETECTED   CD4 T Cell Abs (/uL)  Date Value  09/30/2018 621  12/10/2017 460  06/11/2017 470    Review of Systems  Constitutional: Negative for appetite change and unexpected weight change.  HENT: Positive for sore throat. Negative for trouble swallowing and voice change.    Gastrointestinal: Negative for constipation and diarrhea.  Genitourinary: Negative for difficulty urinating.  Psychiatric/Behavioral: Positive for dysphoric mood.   Throat is sore "when I talk a lot" Please see HPI. All other systems reviewed and negative    Objective:   Physical Exam Constitutional:      Appearance: Normal appearance.  HENT:     Mouth/Throat:     Mouth: Mucous membranes are moist.     Pharynx: No oropharyngeal exudate.  Eyes:     Extraocular Movements: Extraocular movements intact.     Pupils: Pupils are equal, round, and reactive to light.  Neck:     Musculoskeletal: Normal range of motion and neck supple. No muscular tenderness.  Cardiovascular:     Rate and Rhythm: Normal rate and regular rhythm.  Pulmonary:     Effort: Pulmonary effort is normal.     Breath sounds: Normal breath sounds.  Abdominal:     General: Abdomen is flat. Bowel sounds are normal. There is no distension.     Palpations: Abdomen is soft.     Tenderness: There is no abdominal tenderness.  Musculoskeletal:     Right lower leg: No edema.     Left lower leg: No edema.  Lymphadenopathy:     Cervical: No cervical adenopathy.  Neurological:     General: No focal deficit present.     Mental Status: She is alert.  Psychiatric:        Mood and Affect: Mood normal.  Assessment & Plan:

## 2018-10-25 NOTE — Assessment & Plan Note (Signed)
She is to have repeat MRI per pt.

## 2018-10-25 NOTE — Assessment & Plan Note (Signed)
Appreciate Endo f/u.  Still working on dosing per pt.

## 2018-10-29 ENCOUNTER — Other Ambulatory Visit: Payer: Self-pay

## 2018-10-29 ENCOUNTER — Other Ambulatory Visit: Payer: Medicare Other

## 2018-10-29 ENCOUNTER — Ambulatory Visit
Admission: RE | Admit: 2018-10-29 | Discharge: 2018-10-29 | Disposition: A | Discharge status: Home / Self Care | Payer: Medicare Other | Source: Ambulatory Visit | Attending: Nurse Practitioner | Admitting: Nurse Practitioner

## 2018-10-29 DIAGNOSIS — M8580 Other specified disorders of bone density and structure, unspecified site: Secondary | ICD-10-CM

## 2018-10-29 DIAGNOSIS — E2839 Other primary ovarian failure: Secondary | ICD-10-CM

## 2018-11-02 ENCOUNTER — Other Ambulatory Visit: Payer: Medicare Other

## 2018-11-08 ENCOUNTER — Telehealth: Payer: Self-pay | Admitting: *Deleted

## 2018-11-08 ENCOUNTER — Other Ambulatory Visit: Payer: Self-pay

## 2018-11-08 ENCOUNTER — Ambulatory Visit
Admission: RE | Admit: 2018-11-08 | Discharge: 2018-11-08 | Disposition: A | Discharge status: Home / Self Care | Payer: Medicare Other | Source: Ambulatory Visit | Attending: Endocrinology | Admitting: Endocrinology

## 2018-11-08 DIAGNOSIS — D352 Benign neoplasm of pituitary gland: Secondary | ICD-10-CM

## 2018-11-08 MED ORDER — GADOBENATE DIMEGLUMINE 529 MG/ML IV SOLN
6.0000 mL | Freq: Once | INTRAVENOUS | Status: AC | PRN
  Administered 2018-11-08: 11:00:00 6 mL via INTRAVENOUS

## 2018-11-08 NOTE — Telephone Encounter (Signed)
Patient called and left message on Clinical Intake earlier and I tried calling patient back twice but have gotten voicemail and LMOM to return call.  Patient was complaining of her Elbow swollen and painful.  I called to schedule an appointment for patient to come in and had to [facility] to return call. Awaiting callback.

## 2018-11-09 NOTE — Telephone Encounter (Signed)
LMOM to return call.

## 2018-11-20 ENCOUNTER — Other Ambulatory Visit: Payer: Self-pay | Admitting: Nurse Practitioner

## 2018-11-20 ENCOUNTER — Other Ambulatory Visit: Payer: Self-pay | Admitting: Endocrinology

## 2018-11-22 NOTE — Telephone Encounter (Signed)
Called patient to schedule an appointment for update Opioid Treatment Agreement Contract. Appointment scheduled for 12/15/2018.   RX last filled in [facility] on 10/19/2018. RX request sent to Lauree Chandler, NP to review [place] Database and approve if necessary.

## 2018-12-08 IMAGING — CR DG SHOULDER 2+V*L*
3 series · 3 of 3 positions shown · non-contrast
Comparison: None.

CLINICAL DATA: Neck and left shoulder pain for several months, no
injury.

EXAM:
LEFT SHOULDER - 2+ VIEW

[w shoulder ap internal left]
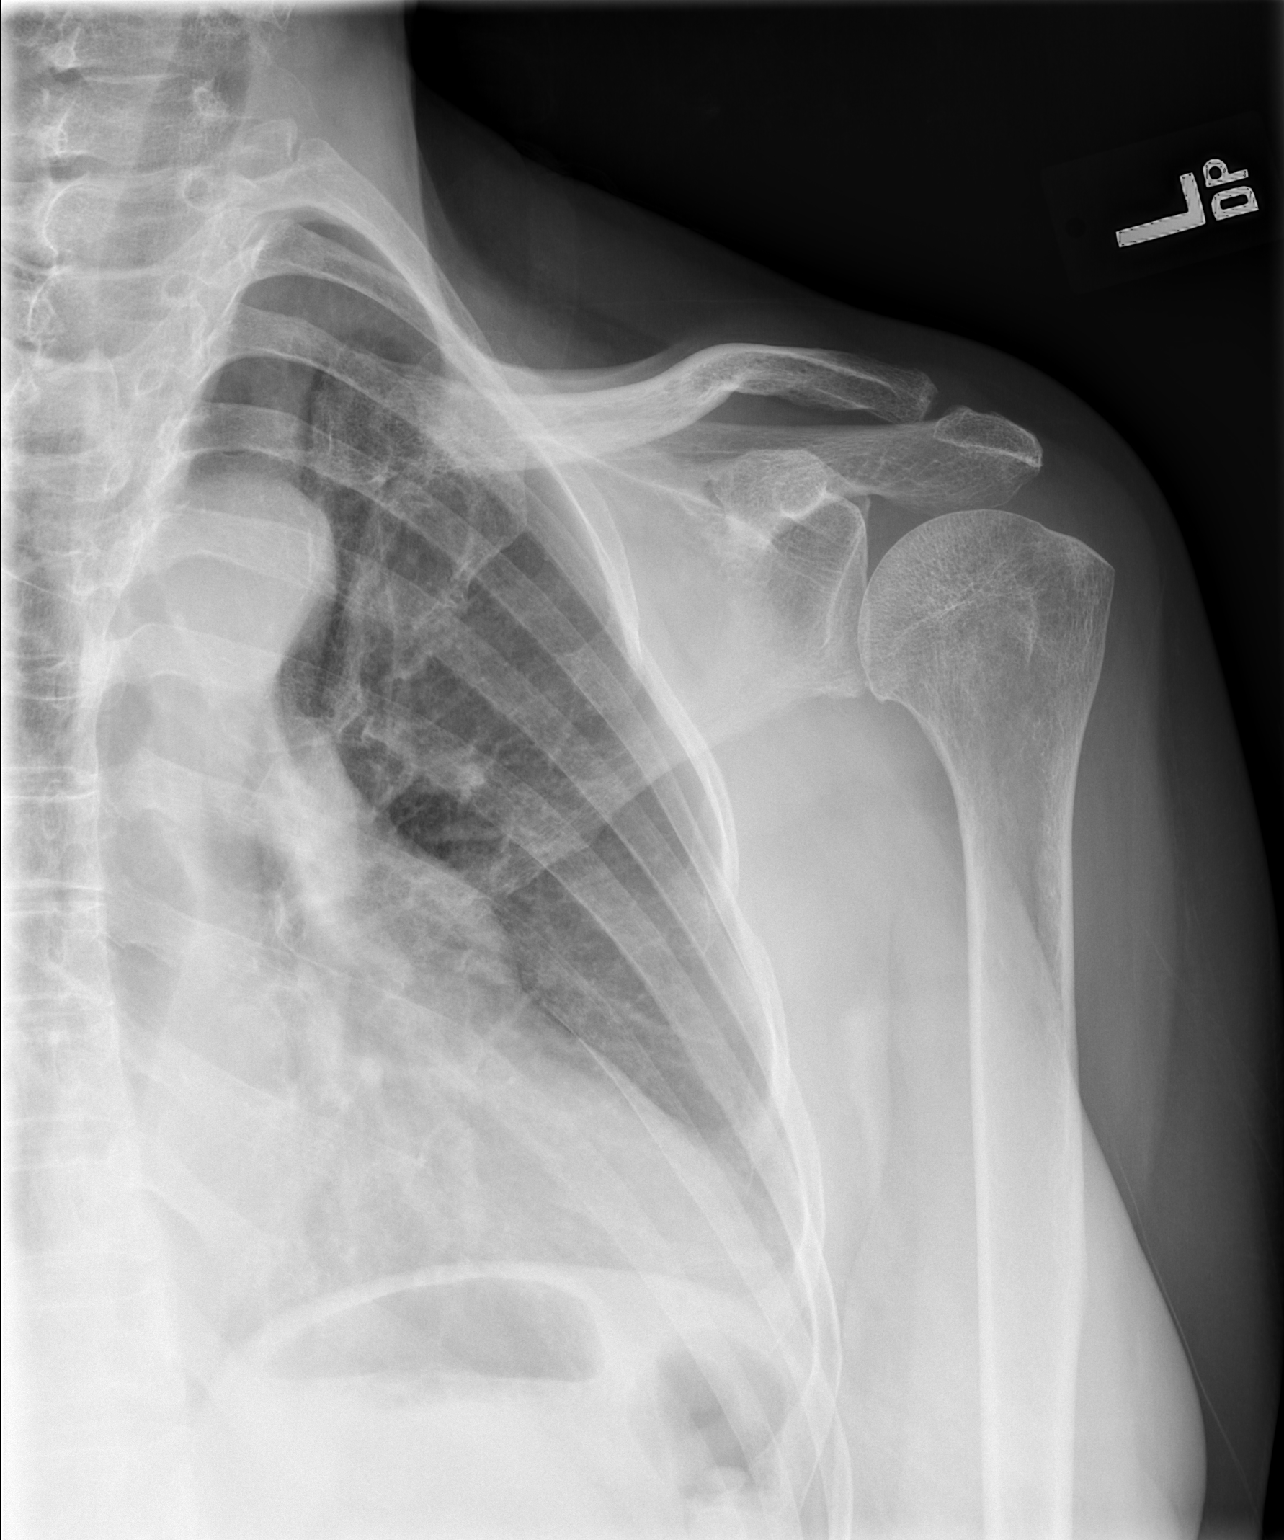

[w shoulder y view left]
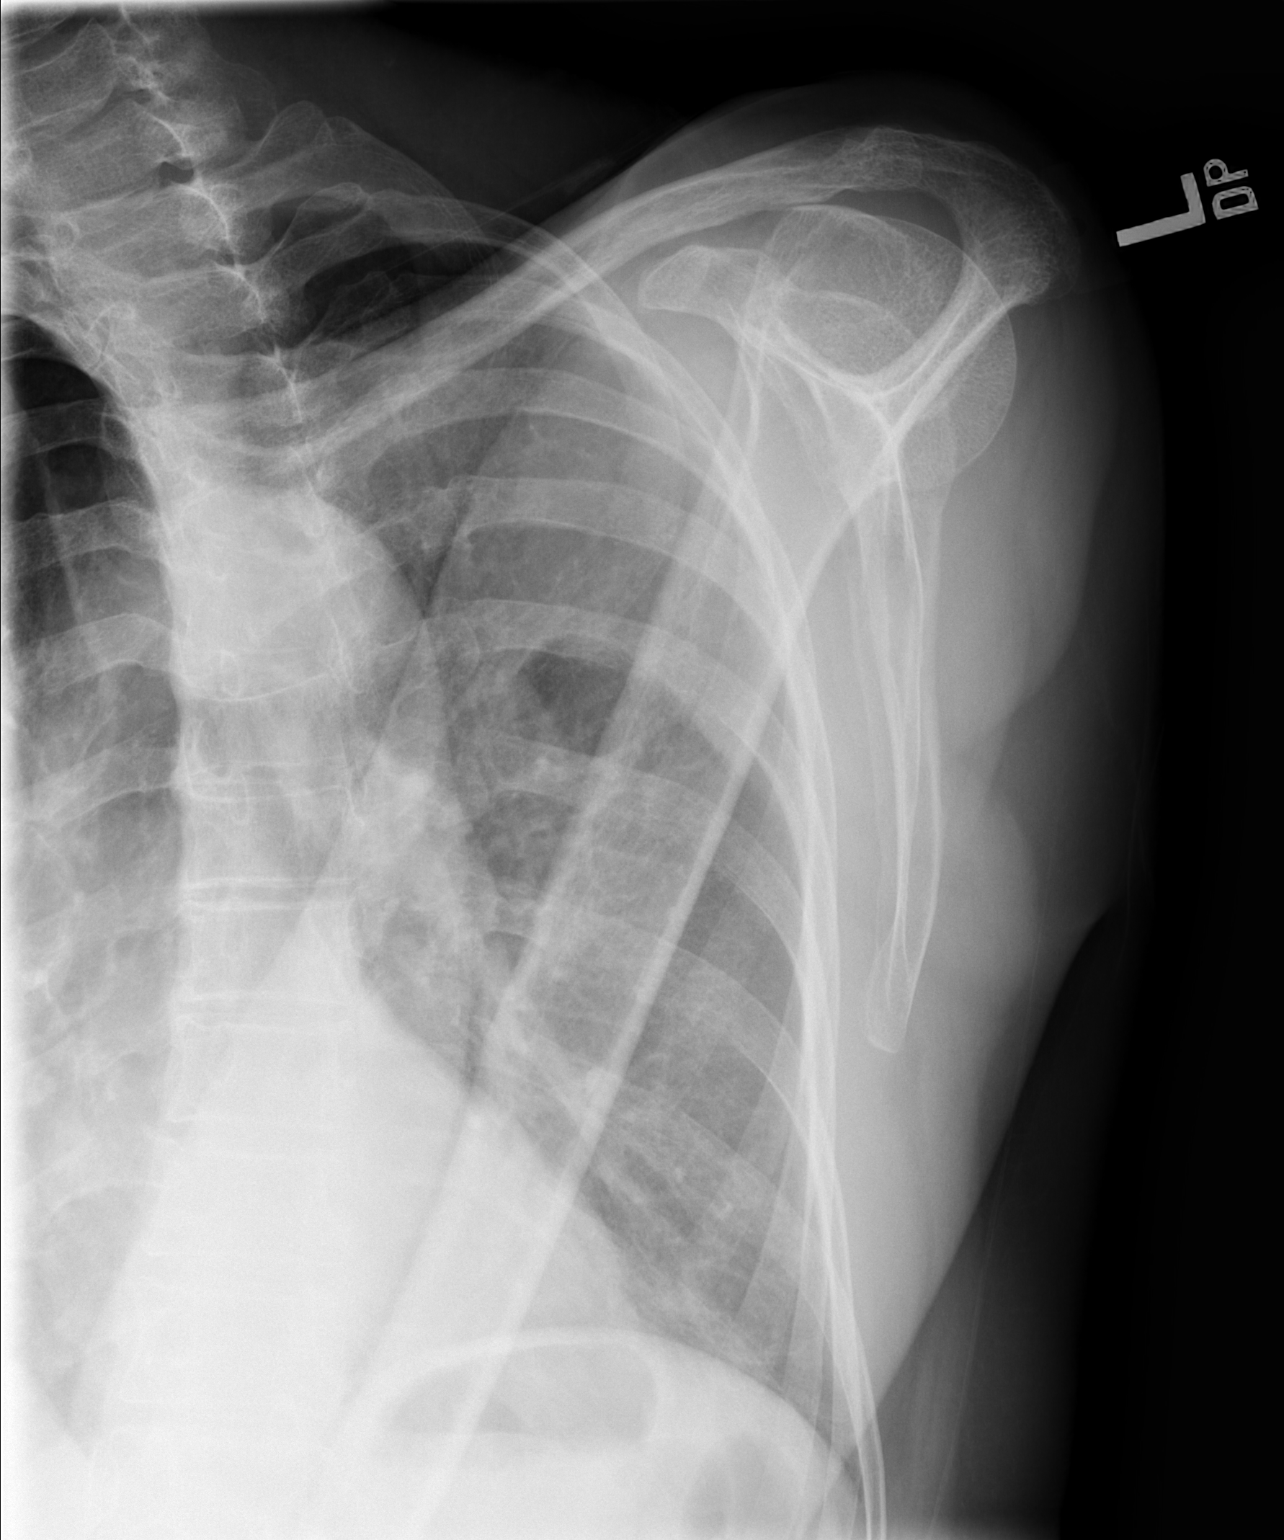

[w shoulder axillary left *]
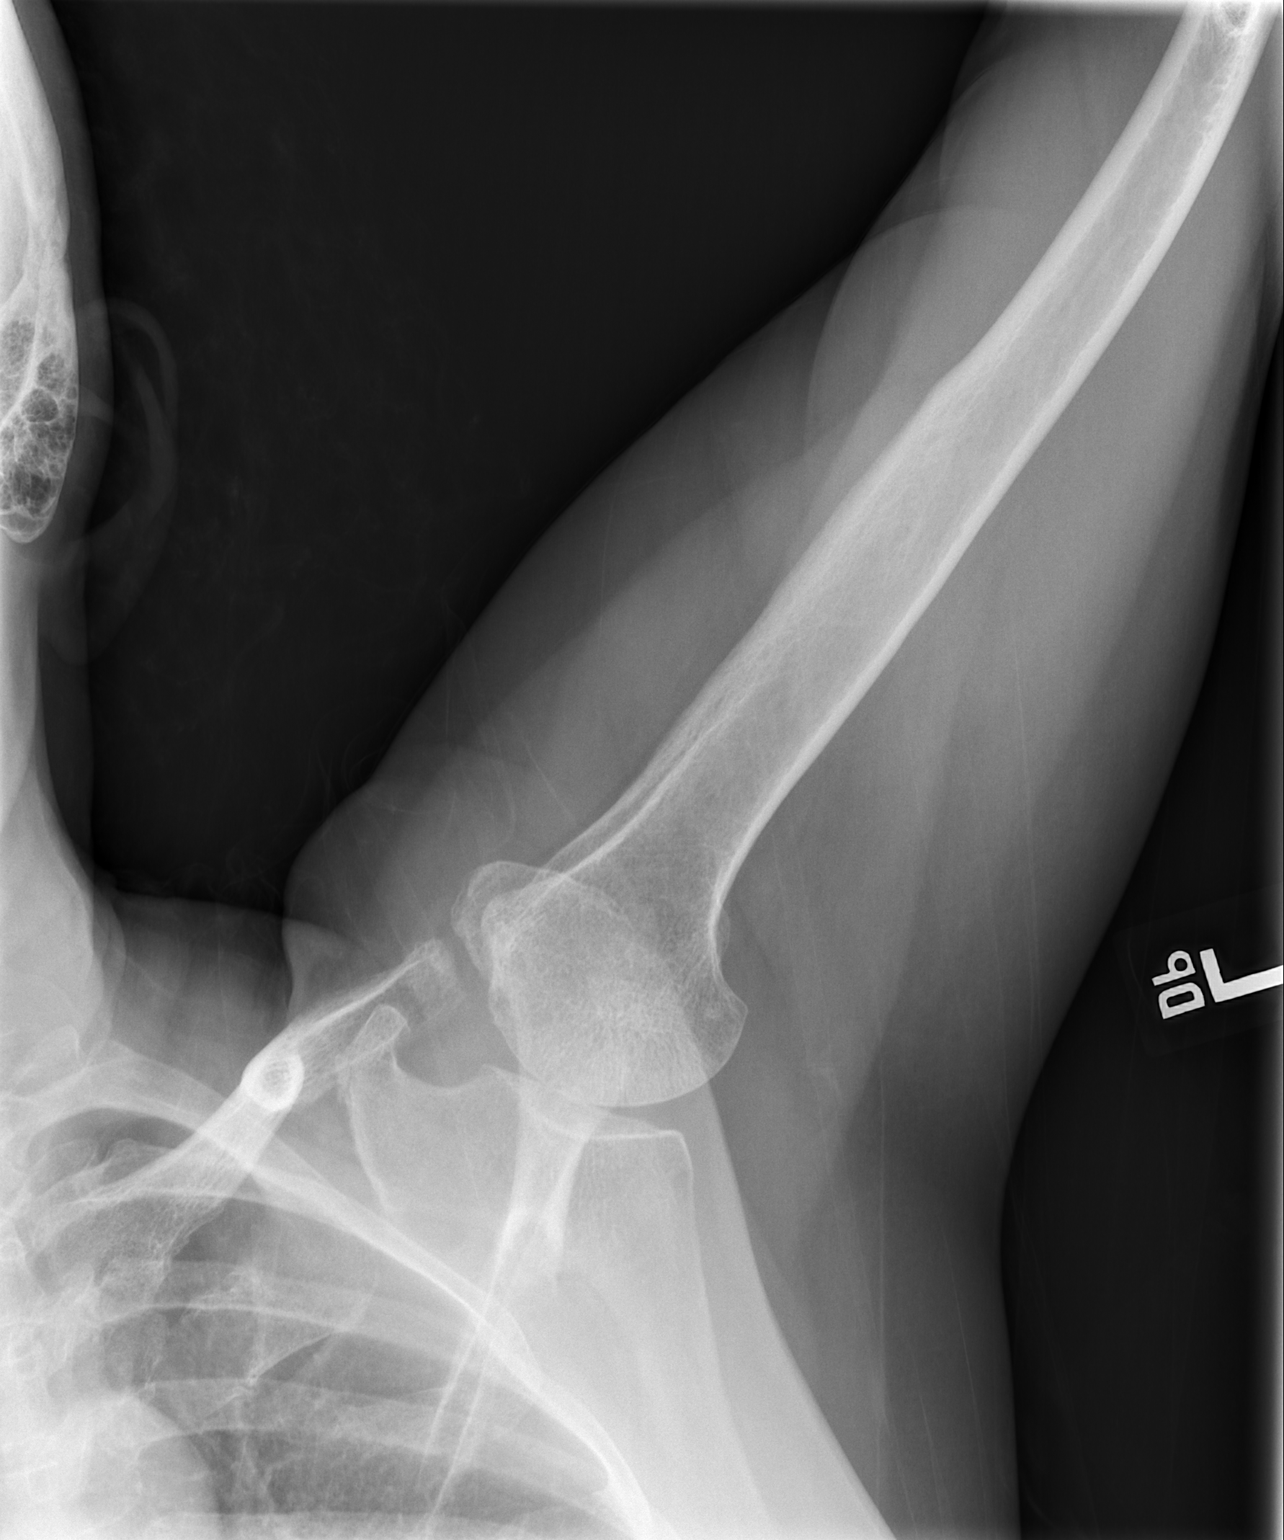

[3 of 3 positions shown; findings below may reference images not displayed]

FINDINGS: Osseous alignment is normal. Bone mineralization is normal. No
fracture line or displaced fracture fragment identified. No
degenerative change. Soft tissues about the left shoulder are
unremarkable.
IMPRESSION: Negative.

## 2018-12-11 IMAGING — CR DG CHEST 2V
2 series · 2 of 2 positions shown · non-contrast
Comparison: 03/15/2015

CLINICAL DATA: Preop chest radiograph. Patient with new cough and
some fever. Some dyspnea.

EXAM:
CHEST  2 VIEW

[w chest pa]
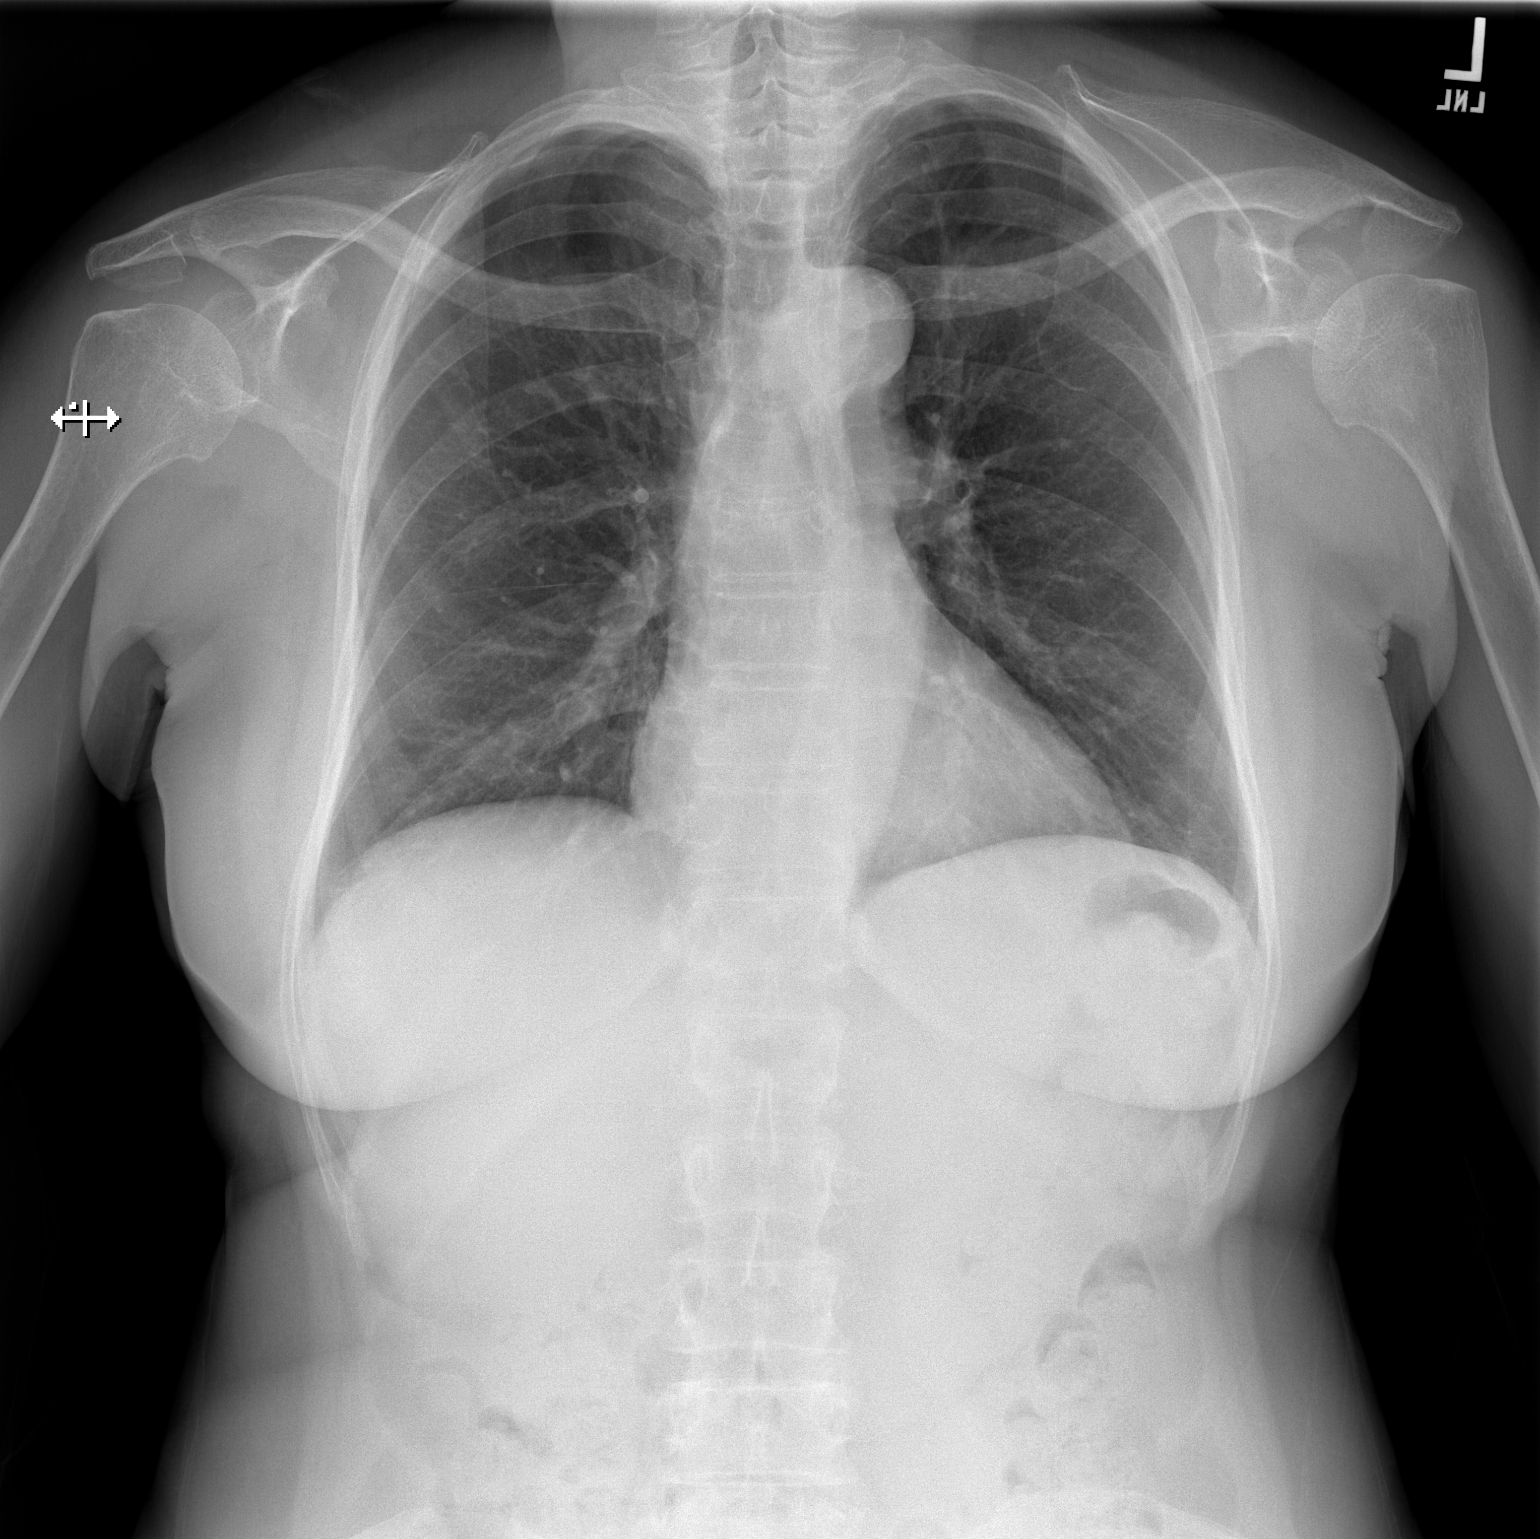

[w chest lat]
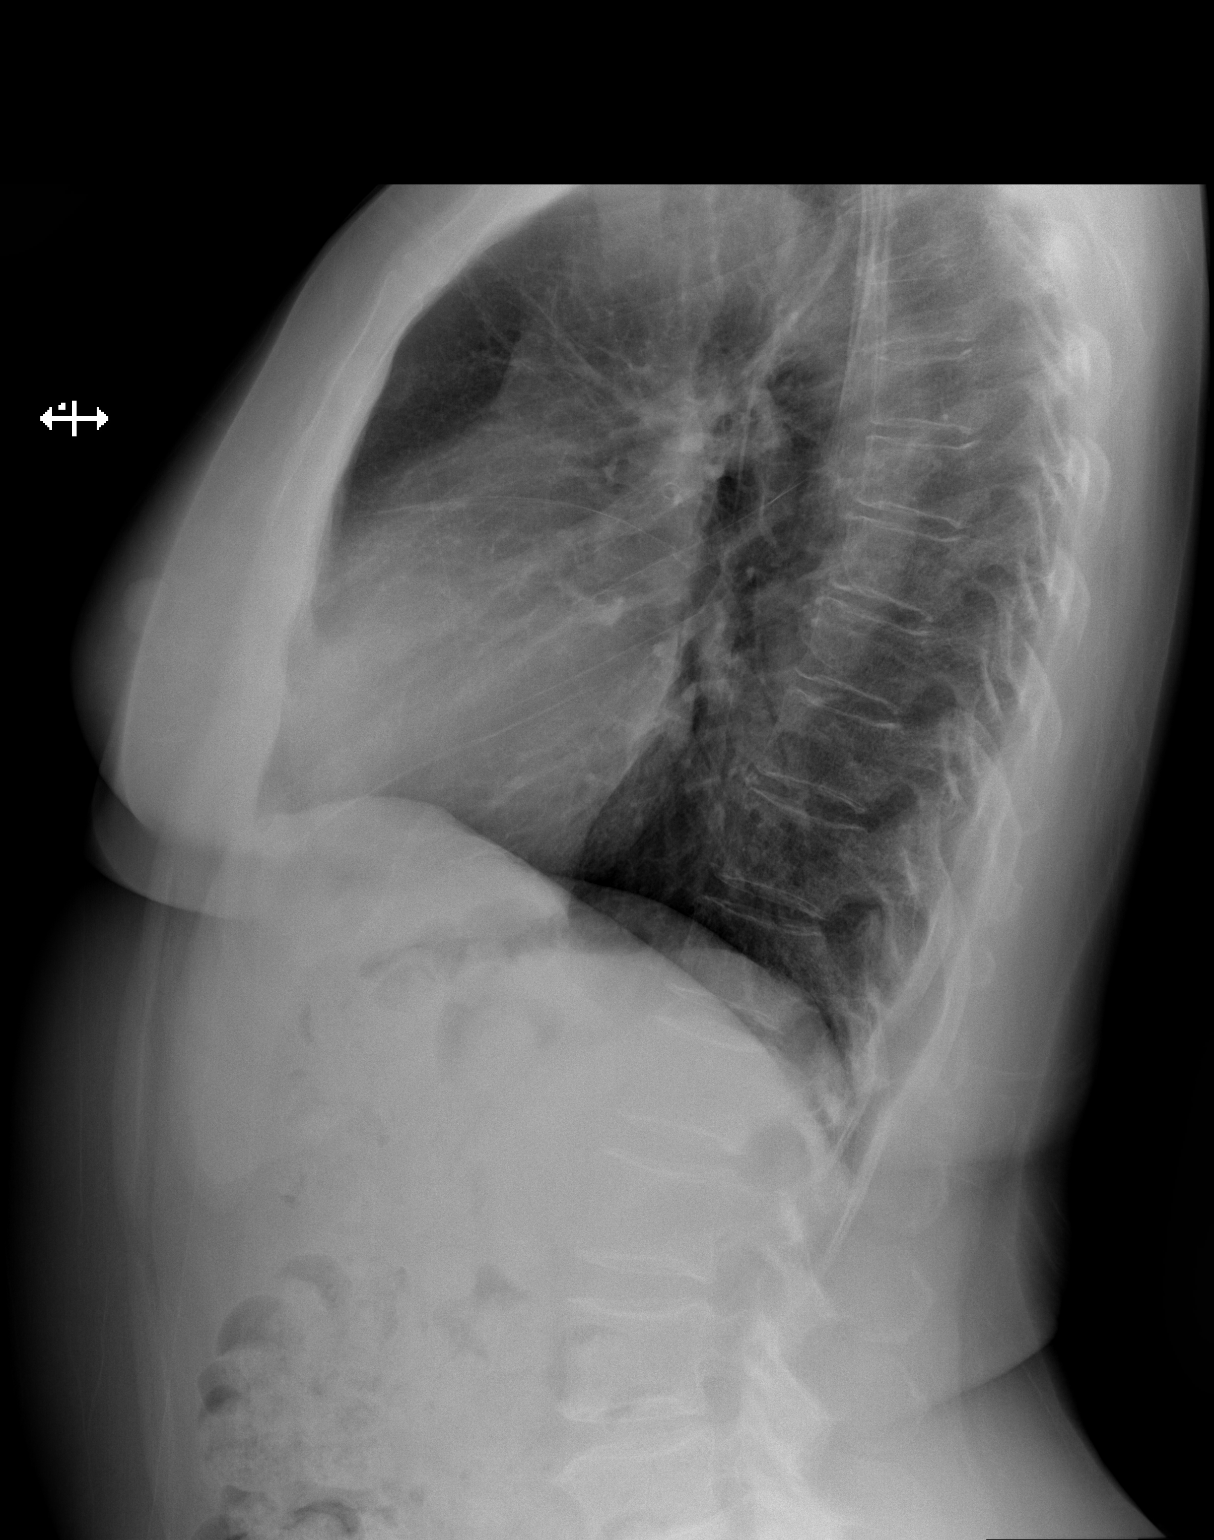

[2 of 2 positions shown; findings below may reference images not displayed]

FINDINGS: Heart, mediastinum and hila are unremarkable.

The lungs are clear.

No pleural effusion or pneumothorax.

Skeletal structures are intact.
IMPRESSION: No active cardiopulmonary disease.

## 2018-12-15 ENCOUNTER — Ambulatory Visit: Payer: Medicare Other | Admitting: Nurse Practitioner

## 2018-12-17 ENCOUNTER — Other Ambulatory Visit: Payer: Self-pay

## 2018-12-21 ENCOUNTER — Encounter: Payer: Self-pay | Admitting: Endocrinology

## 2018-12-21 ENCOUNTER — Ambulatory Visit (INDEPENDENT_AMBULATORY_CARE_PROVIDER_SITE_OTHER): Payer: Medicare Other | Admitting: Endocrinology

## 2018-12-21 VITALS — BP 120/80 | HR 79 | Ht 65.0 in | Wt 131.4 lb

## 2018-12-21 DIAGNOSIS — E890 Postprocedural hypothyroidism: Secondary | ICD-10-CM | POA: Diagnosis not present

## 2018-12-21 DIAGNOSIS — C73 Malignant neoplasm of thyroid gland: Secondary | ICD-10-CM | POA: Diagnosis not present

## 2018-12-21 NOTE — Patient Instructions (Addendum)
Blood tests are requested for you today.  We'll let you know about the results.  Please come back for a follow-up appointment in 6 months.   

## 2018-12-21 NOTE — Progress Notes (Signed)
Subjective:    Patient ID: Laurie Gray, female    DOB: [birth year], 63 y.o.   MRN: [id number]  HPI Pt returns for f/u stage 1 of differentiated thyroid cancer, with this chronology: 1/18: thyroidectomy: PAPILLARY CARCINOMA, 0.9 CM. - FOCAL MICROSCOPIC EXTRATHYROIDAL EXTENSION PRESENT; INVOLVES RESECTION MARGIN.   3/18: RAI rx with thyrogen, 109 mCi 3/18: post-therapy scan: residual uptake in the thyroid bed. No evidence for distant metastatic disease.   1/19: TG=0.1 (Ab neg) 2/20: TG=0.1 (Ab neg) 10/20: TG=0.1 (Ab neg) Pt also has pituitary cyst (dx'ed 2005, when MRI showed 3.5 x 6.5 mm cyst; f/u in 2020 was unchanged).   She has moderate palpitations in the chest, and assoc anxiety.  She therefore takes slightly less than a full 100 mcg/d tab.   Past Medical History:  Diagnosis Date  . Anemia   . Anxiety    hx of-not on any meds  . Arthritis    "shoulders, arms, fingers, neck, back" (01/31/2016)  . Chronic cervical pain   . Family history of breast cancer   . History of bronchitis   . History of colon polyps   . History of kidney stones   . HIV infection ([place]) dx'd 2000   takes odefsey  . Hyperlipidemia    takes Pravastatin daily  . Hypertension    takes Benicar HCT daily  . Hyperthyroidism   . Hypothyroidism   . Internal hemorrhoids   . Joint pain   . Migraine    "in the past" (01/31/2016)  . Parathyroid adenoma   . Pituitary adenoma ([place])   . Refusal of blood transfusions as patient is Jehovah's Witness    "but substitutes are fine". (01/31/2016)  . Thyroid cancer ([place]) 12/2015  . Upper back pain     Past Surgical History:  Procedure Laterality Date  . ABDOMINAL HYSTERECTOMY    . BREAST EXCISIONAL BIOPSY Left    no scar seen   . BUNIONECTOMY Right   . COLONOSCOPY    . CYSTOSCOPY    . HAMMER TOE SURGERY Left    "middle toe was broke; turned into hammertoe then repaired"  . HAMMER TOE SURGERY Bilateral    "both small toes"  . PARATHYROIDECTOMY Right  01/31/2016  . PARATHYROIDECTOMY Right 01/31/2016   Procedure: PARATHYROIDECTOMY ON RIGHT SIDE;  Surgeon: Izora Gala, MD;  Location: [facility];  Service: ENT;  Laterality: Right;  . SALPINGOOPHORECTOMY Bilateral   . THYROIDECTOMY N/A 01/31/2016   Procedure: TOTAL THYROIDECTOMY;  Surgeon: Izora Gala, MD;  Location: [facility];  Service: ENT;  Laterality: N/A;  . TOE SURGERY     bunion  . TONSILLECTOMY     as a child  . TOTAL THYROIDECTOMY  01/31/2016  . TUBAL LIGATION     30 yrs ago    Social History   Socioeconomic History  . Marital status: Widowed    Spouse name: Not on file  . Number of children: Not on file  . Years of education: Not on file  . Highest education level: Not on file  Occupational History  . Not on file  Tobacco Use  . Smoking status: Never Smoker  . Smokeless tobacco: Never Used  Substance and Sexual Activity  . Alcohol use: Yes    Alcohol/week: 0.0 standard drinks    Comment: 01/31/2016 "might go out to eat and have a drink once/month; no more than that"  . Drug use: No  . Sexual activity: Not Currently    Partners: Male  Comment: declined condoms  Other Topics Concern  . Not on file  Social History Narrative   Normal diet   Drinks Caffeine- coffee/coke/chocolate    Lives in an apartment/ third level/1 person/1 pet   Past profession- Museum/gallery exhibitions officer, cosmetologist   Exercises 3-4 x weekly (cardio and weights)       Social Determinants of Health   Financial Resource Strain:   . Difficulty of Paying Living Expenses: Not on file  Food Insecurity:   . Worried About Charity fundraiser in the Last Year: Not on file  . Ran Out of Food in the Last Year: Not on file  Transportation Needs:   . Lack of Transportation (Medical): Not on file  . Lack of Transportation (Non-Medical): Not on file  Physical Activity:   . Days of Exercise per Week: Not on file  . Minutes of Exercise per Session: Not on file  Stress:   . Feeling of Stress : Not on file  Social  Connections:   . Frequency of Communication with Friends and Family: Not on file  . Frequency of Social Gatherings with Friends and Family: Not on file  . Attends Religious Services: Not on file  . Active Member of Clubs or Organizations: Not on file  . Attends Archivist Meetings: Not on file  . Marital Status: Not on file  Intimate Partner Violence:   . Fear of Current or Ex-Partner: Not on file  . Emotionally Abused: Not on file  . Physically Abused: Not on file  . Sexually Abused: Not on file    Current Outpatient Medications on File Prior to Visit  Medication Sig Dispense Refill  . Cyanocobalamin (B-12 PO) Take 1 tablet by mouth daily.    . DULoxetine (CYMBALTA) 30 MG capsule TAKE 1 CAPSULE BY MOUTH EVERY DAY 90 capsule 1  . glucosamine-chondroitin 500-400 MG tablet Take 1 tablet by mouth 2 (two) times daily.    . hydrocortisone (PROCTO-PAK) 1 % CREA Twice daily as needed for hemorrhoids 28.35 g 1  . Krill Oil 500 MG CAPS Take by mouth daily.    Marland Kitchen levothyroxine (SYNTHROID) 100 MCG tablet TAKE 1 TABLET BY MOUTH DAILY BEFORE BREAKFAST. 90 tablet 1  . Multiple Vitamins-Minerals (MULTIVITAMIN ADULTS 50+ PO) Take 1 tablet by mouth daily.    . ODEFSEY 200-25-25 MG TABS tablet TAKE 1 TABLET BY MOUTH EVERY DAY WITH BREAKFAST 90 tablet 1  . olmesartan-hydrochlorothiazide (BENICAR HCT) 20-12.5 MG tablet TAKE 1 TABLET BY MOUTH DAILY. 90 tablet 0  . pravastatin (PRAVACHOL) 40 MG tablet TAKE 1 TABLET BY MOUTH EVERY DAY 90 tablet 1  . traMADol (ULTRAM) 50 MG tablet TAKE 1 TABLET BY MOUTH THREE TIMES A DAY AS NEEDED FOR PAIN 90 tablet 0   No current facility-administered medications on file prior to visit.    Allergies  Allergen Reactions  . Crestor [Rosuvastatin Calcium]     myalgias  . Pravastatin     myalgias  . Sulfonamide Derivatives Swelling    SWELLING REACTION UNSPECIFIED     Family History  Problem Relation Age of Onset  . Breast cancer Mother 26  . Hypertension  Mother   . Cancer Mother   . Hyperlipidemia Mother   . Stroke Mother   . Other Father        'vocal dysautonomia'  . Breast cancer Sister 55  . Osteoarthritis Maternal Grandmother   . Colon cancer Neg Hx     BP 120/80 (BP Location: Right Arm, Patient Position:  Sitting, Cuff Size: Normal)   Pulse 79   Ht '5\' 5"'  (1.651 m)   Wt 131 lb 6.4 oz (59.6 kg)   SpO2 97%   BMI 21.87 kg/m    Review of Systems Denies neck pain or swelling.    Objective:   Physical Exam VITAL SIGNS:  See vs page GENERAL: no distress Neck: a healed scar is present.  I do not appreciate a nodule in the thyroid or elsewhere in the neck CV: RRR, no murmur SKIN: not diaphoretic PSYCH: anxious      Assessment & Plan:  Hypothyroidism: recheck today Palpitation: this limits aggressiveness of glycemic control PTC: recheck today  Patient Instructions  Blood tests are requested for you today.  We'll let you know about the results.  Please come back for a follow-up appointment in 6 months.

## 2018-12-22 ENCOUNTER — Other Ambulatory Visit: Payer: Self-pay | Admitting: Endocrinology

## 2018-12-22 LAB — THYROGLOBULIN LEVEL: Thyroglobulin: 0.1 ng/mL — ABNORMAL LOW

## 2018-12-22 LAB — TSH: TSH: 0.09 u[IU]/mL — ABNORMAL LOW (ref 0.35–4.50)

## 2018-12-22 LAB — T4, FREE: Free T4: 1.34 ng/dL (ref 0.60–1.60)

## 2018-12-22 LAB — THYROGLOBULIN ANTIBODY: Thyroglobulin Ab: <1 IU/mL (ref <=1)

## 2018-12-22 MED ORDER — LEVOTHYROXINE SODIUM 100 MCG PO TABS: ORAL_TABLET | ORAL | 1 refills | Status: DC

## 2018-12-24 ENCOUNTER — Other Ambulatory Visit: Payer: Self-pay

## 2018-12-24 ENCOUNTER — Encounter: Payer: Self-pay | Admitting: Nurse Practitioner

## 2018-12-24 ENCOUNTER — Ambulatory Visit (INDEPENDENT_AMBULATORY_CARE_PROVIDER_SITE_OTHER): Payer: Medicare Other | Admitting: Nurse Practitioner

## 2018-12-24 VITALS — BP 128/80 | HR 73 | Temp 97.8°F | Ht 65.0 in | Wt 131.0 lb

## 2018-12-24 DIAGNOSIS — M720 Palmar fascial fibromatosis [Dupuytren]: Secondary | ICD-10-CM

## 2018-12-24 DIAGNOSIS — M2550 Pain in unspecified joint: Secondary | ICD-10-CM | POA: Diagnosis not present

## 2018-12-24 MED ORDER — TRAMADOL HCL 50 MG PO TABS: ORAL_TABLET | ORAL | 0 refills | Status: DC

## 2018-12-24 NOTE — Progress Notes (Signed)
Careteam: Patient Care Team: Lauree Chandler, NP as PCP - General (Geriatric Medicine) Campbell Riches, MD as PCP - Infectious Diseases (Infectious Diseases) Jackolyn Confer, MD as Consulting Physician (General Surgery)  Advanced Directive information    Allergies  Allergen Reactions  . Crestor [Rosuvastatin Calcium]     myalgias  . Pravastatin     myalgias  . Sulfonamide Derivatives Swelling    SWELLING REACTION UNSPECIFIED     Chief Complaint  Patient presents with  . Medication Management    Review medications and update non opioid treatment agreement      HPI: Patient is a 63 y.o. female seen in the office today for  Has chronic pain due toTorn rotator cuff tear and limited ROM in shoulder, OA in multiple joints, dupuytren's contracture- she requires tramadol 50 mg three times daily which has kept her doing her ADls Plan to having hand surgery but no planned date. She is participating in PT, stretches and hot and cold therapies to help with pain. Also sees several specialist to help control pain.   Review of Systems:  Review of Systems  Constitutional: Negative for chills and fever.  Musculoskeletal: Positive for joint pain and myalgias. Negative for back pain, falls and neck pain.  Neurological: Positive for tremors.  Psychiatric/Behavioral: The patient is nervous/anxious.     Past Medical History:  Diagnosis Date  . Anemia   . Anxiety    hx of-not on any meds  . Arthritis    "shoulders, arms, fingers, neck, back" (01/31/2016)  . Chronic cervical pain   . Family history of breast cancer   . History of bronchitis   . History of colon polyps   . History of kidney stones   . HIV infection ([place]) dx'd 2000   takes odefsey  . Hyperlipidemia    takes Pravastatin daily  . Hypertension    takes Benicar HCT daily  . Hyperthyroidism   . Hypothyroidism   . Internal hemorrhoids   . Joint pain   . Migraine    "in the past" (01/31/2016)  . Parathyroid  adenoma   . Pituitary adenoma ([place])   . Refusal of blood transfusions as patient is Jehovah's Witness    "but substitutes are fine". (01/31/2016)  . Thyroid cancer ([place]) 12/2015  . Upper back pain    Past Surgical History:  Procedure Laterality Date  . ABDOMINAL HYSTERECTOMY    . BREAST EXCISIONAL BIOPSY Left    no scar seen   . BUNIONECTOMY Right   . COLONOSCOPY    . CYSTOSCOPY    . HAMMER TOE SURGERY Left    "middle toe was broke; turned into hammertoe then repaired"  . HAMMER TOE SURGERY Bilateral    "both small toes"  . PARATHYROIDECTOMY Right 01/31/2016  . PARATHYROIDECTOMY Right 01/31/2016   Procedure: PARATHYROIDECTOMY ON RIGHT SIDE;  Surgeon: Izora Gala, MD;  Location: [facility];  Service: ENT;  Laterality: Right;  . SALPINGOOPHORECTOMY Bilateral   . THYROIDECTOMY N/A 01/31/2016   Procedure: TOTAL THYROIDECTOMY;  Surgeon: Izora Gala, MD;  Location: [facility];  Service: ENT;  Laterality: N/A;  . TOE SURGERY     bunion  . TONSILLECTOMY     as a child  . TOTAL THYROIDECTOMY  01/31/2016  . TUBAL LIGATION     30 yrs ago   Social History:   reports that she has never smoked. She has never used smokeless tobacco. She reports current alcohol use. She reports that she does not  use drugs.  Family History  Problem Relation Age of Onset  . Breast cancer Mother 91  . Hypertension Mother   . Cancer Mother   . Hyperlipidemia Mother   . Stroke Mother   . Other Father        'vocal dysautonomia'  . Breast cancer Sister 65  . Osteoarthritis Maternal Grandmother   . Colon cancer Neg Hx     Medications: Patient's Medications  New Prescriptions   No medications on file  Previous Medications   CYANOCOBALAMIN (B-12 PO)    Take 1 tablet by mouth daily.   DULOXETINE (CYMBALTA) 30 MG CAPSULE    TAKE 1 CAPSULE BY MOUTH EVERY DAY   GLUCOSAMINE-CHONDROITIN 500-400 MG TABLET    Take 1 tablet by mouth 2 (two) times daily.   HYDROCORTISONE (PROCTO-PAK) 1 % CREA    Twice daily as needed for  hemorrhoids   KRILL OIL 500 MG CAPS    Take by mouth daily.   LEVOTHYROXINE (SYNTHROID) 100 MCG TABLET    TAKE 1 TABLET BY MOUTH DAILY BEFORE BREAKFAST.   MULTIPLE VITAMINS-MINERALS (MULTIVITAMIN ADULTS 50+ PO)    Take 1 tablet by mouth daily.   ODEFSEY 200-25-25 MG TABS TABLET    TAKE 1 TABLET BY MOUTH EVERY DAY WITH BREAKFAST   OLMESARTAN-HYDROCHLOROTHIAZIDE (BENICAR HCT) 20-12.5 MG TABLET    TAKE 1 TABLET BY MOUTH DAILY.   PRAVASTATIN (PRAVACHOL) 40 MG TABLET    TAKE 1 TABLET BY MOUTH EVERY DAY   TRAMADOL (ULTRAM) 50 MG TABLET    TAKE 1 TABLET BY MOUTH THREE TIMES A DAY AS NEEDED FOR PAIN  Modified Medications   No medications on file  Discontinued Medications   No medications on file    Physical Exam:  Vitals:   12/24/18 1559  BP: 128/80  Pulse: 73  Temp: 97.8 F (36.6 C)  TempSrc: Temporal  SpO2: 98%  Weight: 131 lb (59.4 kg)  Height: '5\' 5"'  (1.651 m)   Body mass index is 21.8 kg/m. Wt Readings from Last 3 Encounters:  12/24/18 131 lb (59.4 kg)  12/21/18 131 lb 6.4 oz (59.6 kg)  10/25/18 134 lb (60.8 kg)    Physical Exam Constitutional:      Appearance: Normal appearance.  Musculoskeletal:        General: Swelling, tenderness and deformity present.     Right shoulder: Decreased range of motion.     Left shoulder: Decreased range of motion.     Right hand: Deformity present.     Left hand: Deformity present.     Right lower leg: No edema.  Skin:    General: Skin is warm and dry.  Neurological:     Mental Status: She is alert.     Labs reviewed: Basic Metabolic Panel: Recent Labs    02/11/18 1142 03/18/18 1519 08/19/18 1526 09/30/18 1403 10/13/18 1346 12/21/18 1550  NA  --   --  142 143  --   --   K  --   --  3.7 4.1  --   --   CL  --   --  103 105  --   --   CO2  --   --  31 28  --   --   GLUCOSE  --   --  100 76  --   --   BUN  --   --  15 11  --   --   CREATININE  --   --  0.89 1.03*  --   --  CALCIUM 9.2  --  9.8 9.6  --   --   TSH 0.04*  1.15  --   --  34.85* 0.09*   Liver Function Tests: Recent Labs    08/19/18 1526 09/30/18 1403  AST 19 20  ALT 18 16  BILITOT 0.3 0.4  PROT 6.8 6.8   No results for input(s): LIPASE, AMYLASE in the last 8760 hours. No results for input(s): AMMONIA in the last 8760 hours. CBC: Recent Labs    08/19/18 1526 09/30/18 1403  WBC 3.8 4.7  NEUTROABS 1,900  --   HGB 12.1 12.1  HCT 36.6 36.8  MCV 96.1 94.6  PLT 192 220   Lipid Panel: Recent Labs    09/30/18 1403  CHOL 246*  HDL 75  LDLCALC 148*  TRIG 117  CHOLHDL 3.3   TSH: Recent Labs    03/18/18 1519 10/13/18 1346 12/21/18 1550  TSH 1.15 34.85* 0.09*   A1C: Lab Results  Component Value Date   HGBA1C 5.6 11/19/2015     Assessment/Plan 1. Dupuytren's contracture of both hands -worsening contractures. Has met with orthopedic and plans to have surgery to correct.  - traMADol (ULTRAM) 50 MG tablet; TAKE 1 TABLET BY MOUTH THREE TIMES A DAY AS NEEDED FOR PAIN  Dispense: 90 tablet; Refill: 0 - UDS  2. Arthralgia, unspecified joint -pain in multiple joints with tenderness and edema. rheumatology has ruled on RA or reactive arthritis but she persist with very painful joints. Tramadol helps control pain. Taking as prescribed without side effects. Helps her function and be able to preform her ADLs - traMADol (ULTRAM) 50 MG tablet; TAKE 1 TABLET BY MOUTH THREE TIMES A DAY AS NEEDED FOR PAIN  Dispense: 90 tablet; Refill: 0 - UDS  Next appt: 02/03/2019 as scheduled.  Carlos American. [place], [facility] [phone]

## 2018-12-25 LAB — PAIN MGMT, PROFILE 1 W/O CONF, U
Amphetamines: NEGATIVE ng/mL
Barbiturates: NEGATIVE ng/mL
Benzodiazepines: NEGATIVE ng/mL
Cocaine Metabolite: NEGATIVE ng/mL
Creatinine: 60.1 mg/dL
Marijuana Metabolite: NEGATIVE ng/mL
Methadone Metabolite: NEGATIVE ng/mL
Opiates: NEGATIVE ng/mL
Oxidant: NEGATIVE ug/mL
Oxycodone: NEGATIVE ng/mL
Phencyclidine: NEGATIVE ng/mL
pH: 6.5 (ref 4.5–9.0)

## 2019-01-18 ENCOUNTER — Other Ambulatory Visit: Payer: Self-pay | Admitting: Nurse Practitioner

## 2019-01-18 DIAGNOSIS — M2550 Pain in unspecified joint: Secondary | ICD-10-CM

## 2019-01-18 DIAGNOSIS — M720 Palmar fascial fibromatosis [Dupuytren]: Secondary | ICD-10-CM

## 2019-01-18 NOTE — Telephone Encounter (Signed)
Patient is 6 days early, last filled 12/24/2018

## 2019-02-02 ENCOUNTER — Other Ambulatory Visit: Payer: Self-pay | Admitting: Nurse Practitioner

## 2019-02-02 ENCOUNTER — Other Ambulatory Visit: Payer: Self-pay

## 2019-02-02 ENCOUNTER — Ambulatory Visit (INDEPENDENT_AMBULATORY_CARE_PROVIDER_SITE_OTHER): Payer: Medicare Other | Admitting: Nurse Practitioner

## 2019-02-02 ENCOUNTER — Encounter: Payer: Self-pay | Admitting: Nurse Practitioner

## 2019-02-02 DIAGNOSIS — Z1231 Encounter for screening mammogram for malignant neoplasm of breast: Secondary | ICD-10-CM

## 2019-02-02 DIAGNOSIS — Z0000 Encounter for general adult medical examination without abnormal findings: Secondary | ICD-10-CM | POA: Diagnosis not present

## 2019-02-02 NOTE — Progress Notes (Signed)
   This service is provided via telemedicine  No vital signs collected/recorded due to the encounter was a telemedicine visit.   Location of patient (ex: home, work):  Home  Patient consents to a telephone visit:  Yes   Location of the provider (ex: office, home):  [facility], Office   Name of any referring provider: N/A  Names of all persons participating in the telemedicine service and their role in the encounter:  S.Chrae B/CMA, [name], NP, and Patient   Time spent on call:  8 min with medical assistant

## 2019-02-02 NOTE — Progress Notes (Signed)
Subjective:   Laurie Gray is a 64 y.o. female who presents for Medicare Annual (Subsequent) preventive examination.  Review of Systems:   Cardiac Risk Factors include: hypertension;dyslipidemia     Objective:     Vitals: There were no vitals taken for this visit.  There is no height or weight on file to calculate BMI.  Advanced Directives 02/02/2019 08/19/2018 04/15/2018 01/28/2018 10/15/2017 06/11/2017 04/29/2017  Does Patient Have a Medical Advance Directive? No No No No No No No  Would patient like information on creating a medical advance directive? Yes (MAU/Ambulatory/Procedural Areas - Information given) No - Patient declined Yes (MAU/Ambulatory/Procedural Areas - Information given) No - Patient declined - - Yes (MAU/Ambulatory/Procedural Areas - Information given)    Tobacco Social History   Tobacco Use  Smoking Status Never Smoker  Smokeless Tobacco Never Used     Counseling given: Not Answered   Clinical Intake:  Pre-visit preparation completed: Yes  Pain : 0-10 Pain Score: 2  Pain Type: Chronic pain Pain Location: Hand Pain Orientation: Left, Right Pain Radiating Towards: wirst to fingers Pain Descriptors / Indicators: Aching, Throbbing Pain Onset: More than a month ago Pain Frequency: Constant Pain Relieving Factors: tramadol Effect of Pain on Daily Activities: limits her from doing things, grabbing things "depressing"  Pain Relieving Factors: tramadol  BMI - recorded: 21 Nutritional Status: BMI of 19-24  Normal Diabetes: No  How often do you need to have someone help you when you read instructions, pamphlets, or other written materials from your doctor or pharmacy?: 1 - Never What is the last grade level you completed in school?: 2 years of college.  Interpreter Needed?: No     Past Medical History:  Diagnosis Date  . Anemia   . Anxiety    hx of-not on any meds  . Arthritis    "shoulders, arms, fingers, neck, back" (01/31/2016)  . Chronic  cervical pain   . Family history of breast cancer   . History of bronchitis   . History of colon polyps   . History of kidney stones   . HIV infection ([place]) dx'd 2000   takes odefsey  . Hyperlipidemia    takes Pravastatin daily  . Hypertension    takes Benicar HCT daily  . Hyperthyroidism   . Hypothyroidism   . Internal hemorrhoids   . Joint pain   . Migraine    "in the past" (01/31/2016)  . Parathyroid adenoma   . Pituitary adenoma ([place])   . Refusal of blood transfusions as patient is Jehovah's Witness    "but substitutes are fine". (01/31/2016)  . Thyroid cancer ([place]) 12/2015  . Upper back pain    Past Surgical History:  Procedure Laterality Date  . ABDOMINAL HYSTERECTOMY    . BREAST EXCISIONAL BIOPSY Left    no scar seen   . BUNIONECTOMY Right   . COLONOSCOPY    . CYSTOSCOPY    . HAMMER TOE SURGERY Left    "middle toe was broke; turned into hammertoe then repaired"  . HAMMER TOE SURGERY Bilateral    "both small toes"  . PARATHYROIDECTOMY Right 01/31/2016  . PARATHYROIDECTOMY Right 01/31/2016   Procedure: PARATHYROIDECTOMY ON RIGHT SIDE;  Surgeon: Izora Gala, MD;  Location: [facility];  Service: ENT;  Laterality: Right;  . SALPINGOOPHORECTOMY Bilateral   . THYROIDECTOMY N/A 01/31/2016   Procedure: TOTAL THYROIDECTOMY;  Surgeon: Izora Gala, MD;  Location: [facility];  Service: ENT;  Laterality: N/A;  . TOE SURGERY  bunion  . TONSILLECTOMY     as a child  . TOTAL THYROIDECTOMY  01/31/2016  . TUBAL LIGATION     30 yrs ago   Family History  Problem Relation Age of Onset  . Breast cancer Mother 66  . Hypertension Mother   . Cancer Mother   . Hyperlipidemia Mother   . Stroke Mother   . Other Father        'vocal dysautonomia'  . Breast cancer Sister 68  . Osteoarthritis Maternal Grandmother   . Colon cancer Neg Hx    Social History   Socioeconomic History  . Marital status: Widowed    Spouse name: Not on file  . Number of children: Not on file  . Years of  education: Not on file  . Highest education level: Not on file  Occupational History  . Not on file  Tobacco Use  . Smoking status: Never Smoker  . Smokeless tobacco: Never Used  Substance and Sexual Activity  . Alcohol use: Yes    Alcohol/week: 0.0 standard drinks    Comment: 01/31/2016 "might go out to eat and have a drink once/month; no more than that"  . Drug use: No  . Sexual activity: Not Currently    Partners: Male    Comment: declined condoms  Other Topics Concern  . Not on file  Social History Narrative   Normal diet   Drinks Caffeine- coffee/coke/chocolate    Lives in an apartment/ third level/1 person/1 pet   Past profession- Museum/gallery exhibitions officer, cosmetologist   Exercises 3-4 x weekly (cardio and weights)       Social Determinants of Health   Financial Resource Strain:   . Difficulty of Paying Living Expenses: Not on file  Food Insecurity:   . Worried About Charity fundraiser in the Last Year: Not on file  . Ran Out of Food in the Last Year: Not on file  Transportation Needs:   . Lack of Transportation (Medical): Not on file  . Lack of Transportation (Non-Medical): Not on file  Physical Activity:   . Days of Exercise per Week: Not on file  . Minutes of Exercise per Session: Not on file  Stress:   . Feeling of Stress : Not on file  Social Connections:   . Frequency of Communication with Friends and Family: Not on file  . Frequency of Social Gatherings with Friends and Family: Not on file  . Attends Religious Services: Not on file  . Active Member of Clubs or Organizations: Not on file  . Attends Archivist Meetings: Not on file  . Marital Status: Not on file    Outpatient Encounter Medications as of 02/02/2019  Medication Sig  . Cyanocobalamin (B-12 PO) Take 1 tablet by mouth daily.  . DULoxetine (CYMBALTA) 30 MG capsule TAKE 1 CAPSULE BY MOUTH EVERY DAY  . hydrocortisone (PROCTO-PAK) 1 % CREA Twice daily as needed for hemorrhoids  . Krill Oil 500 MG  CAPS Take by mouth daily.  Marland Kitchen levothyroxine (SYNTHROID) 100 MCG tablet TAKE 1 TABLET BY MOUTH DAILY BEFORE BREAKFAST.  . Multiple Vitamins-Minerals (MULTIVITAMIN ADULTS 50+ PO) Take 1 tablet by mouth daily.  . ODEFSEY 200-25-25 MG TABS tablet TAKE 1 TABLET BY MOUTH EVERY DAY WITH BREAKFAST  . olmesartan-hydrochlorothiazide (BENICAR HCT) 20-12.5 MG tablet TAKE 1 TABLET BY MOUTH DAILY.  . pravastatin (PRAVACHOL) 40 MG tablet TAKE 1 TABLET BY MOUTH EVERY DAY  . traMADol (ULTRAM) 50 MG tablet TAKE 1 TABLET BY MOUTH THREE  TIMES A DAY AS NEEDED FOR PAIN  . [DISCONTINUED] glucosamine-chondroitin 500-400 MG tablet Take 1 tablet by mouth 2 (two) times daily.   No facility-administered encounter medications on file as of 02/02/2019.    Activities of Daily Living In your present state of health, do you have any difficulty performing the following activities: 02/02/2019  Hearing? N  Vision? Y  Comment left eye deficits, following with eye doctor  Difficulty concentrating or making decisions? N  Walking or climbing stairs? N  Dressing or bathing? N  Doing errands, shopping? N  Preparing Food and eating ? N  Using the Toilet? N  In the past six months, have you accidently leaked urine? N  Do you have problems with loss of bowel control? N  Managing your Medications? N  Managing your Finances? N  Housekeeping or managing your Housekeeping? N  Some recent data might be hidden    Patient Care Team: Lauree Chandler, NP as PCP - General (Geriatric Medicine) Campbell Riches, MD as PCP - Infectious Diseases (Infectious Diseases) Jackolyn Confer, MD as Consulting Physician (General Surgery)    Assessment:   This is a routine wellness examination for Shakiah.  Exercise Activities and Dietary recommendations Current Exercise Habits: Home exercise routine, Type of exercise: calisthenics;strength training/weights;treadmill, Time (Minutes): 60, Frequency (Times/Week): 6, Weekly Exercise  (Minutes/Week): 360, Intensity: Mild  Goals    . Exercise 3x per week (30 min per time)     Pt would like to continue to exercise at least 5 times a week     . Increase physical activity     Starting 01/21/16, I will attempt to increase my water intake from 2 cups to 5 cups per day.        Fall Risk Fall Risk  02/02/2019 04/15/2018 01/28/2018 10/15/2017 06/25/2017  Falls in the past year? 1 0 0 No No  Number falls in past yr: 0 0 0 - -  Injury with Fall? 0 0 0 - -   Is the patient's home free of loose throw rugs in walkways, pet beds, electrical cords, etc?   yes      Grab bars in the bathroom? yes      Handrails on the stairs?   none      Adequate lighting?   yes  Timed Get Up and Go performed: na  Depression Screen PHQ 2/9 Scores 02/02/2019 01/28/2018 06/25/2017 01/21/2017  PHQ - 2 Score 0 0 0 1  PHQ- 9 Score - - - -     Cognitive Function MMSE - Mini Mental State Exam 01/28/2018 01/21/2016  Orientation to time 5 5  Orientation to Place 5 5  Registration 3 3  Attention/ Calculation 5 5  Recall 3 3  Language- name 2 objects 2 2  Language- repeat 1 1  Language- follow 3 step command 3 3  Language- read & follow direction 1 1  Write a sentence 1 1  Copy design 1 1  Total score 30 30     6CIT Screen 02/02/2019  What Year? 0 points  What month? 0 points  What time? 0 points  Count back from 20 0 points  Months in reverse 0 points  Repeat phrase 2 points  Total Score 2    Immunization History  Administered Date(s) Administered  . Hepatitis B, adult 06/09/2013, 07/11/2013, 12/05/2013  . Influenza Split 09/08/2010, 11/10/2011  . Influenza Whole 12/25/2008, 09/24/2009  . Influenza,inj,Quad PF,6+ Mos 10/11/2012, 11/02/2013, 11/08/2014, 10/22/2015, 09/24/2016, 10/13/2018  .  Influenza-Unspecified 10/15/2017  . Pneumococcal Conjugate-13 03/28/2014  . Pneumococcal Polysaccharide-23 01/24/2010  . Zoster Recombinat (Shingrix) 01/28/2018, 03/10/2018    Qualifies for Shingles  Vaccine?yes, completed  Screening Tests Health Maintenance  Topic Date Due  . TETANUS/TDAP  01/07/2020 (Originally 10/02/1974)  . PAP SMEAR-Modifier  06/06/2019  . MAMMOGRAM  02/12/2020  . DEXA SCAN  10/28/2020  . COLONOSCOPY  01/09/2025  . INFLUENZA VACCINE  Completed  . Hepatitis C Screening  Completed  . HIV Screening  Completed    Cancer Screenings: Lung: Low Dose CT Chest recommended if Age 62-80 years, 30 pack-year currently smoking OR have quit w/in 15years. Patient does not qualify. Breast:  Up to date on Mammogram? Yes   Up to date of Bone Density/Dexa? Yes Colorectal: up to date  Additional Screenings:  Hepatitis C Screening: completed     Plan:      I have personally reviewed and noted the following in the patient's chart:   . Medical and social history . Use of alcohol, tobacco or illicit drugs  . Current medications and supplements . Functional ability and status . Nutritional status . Physical activity . Advanced directives . List of other physicians . Hospitalizations, surgeries, and ER visits in previous 12 months . Vitals . Screenings to include cognitive, depression, and falls . Referrals and appointments  In addition, I have reviewed and discussed with patient certain preventive protocols, quality metrics, and best practice recommendations. A written personalized care plan for preventive services as well as general preventive health recommendations were provided to patient.     Lauree Chandler, NP  02/02/2019

## 2019-02-02 NOTE — Patient Instructions (Signed)
Laurie Gray , Thank you for taking time to come for your Medicare Wellness Visit. I appreciate your ongoing commitment to your health goals. Please review the following plan we discussed and let me know if I can assist you in the future.   Screening recommendations/referrals: Colonoscopy up to date Mammogram up to date- scheduled for next one Bone Density up to date Recommended yearly ophthalmology/optometry visit for glaucoma screening and checkup Recommended yearly dental visit for hygiene and checkup  Vaccinations: Influenza vaccine up to date Pneumococcal vaccine completed Tdap vaccine DUE- to get at pharmacy. Shingles vaccine completed COVID- COVID-19 Vaccine Information can be found at: ShippingScam.co.uk For questions related to vaccine distribution or appointments, please email [email or web address] or call [phone].  You can also call the COVID-19 Line [phone] TanningAlert.tn.     Advanced directives: to complete and bring back to office  Conditions/risks identified: none  Next appointment: 1 year   Preventive Care 28 Years and Older, Female Preventive care refers to lifestyle choices and visits with your health care provider that can promote health and wellness. What does preventive care include?  A yearly physical exam. This is also called an annual well check.  Dental exams once or twice a year.  Routine eye exams. Ask your health care provider how often you should have your eyes checked.  Personal lifestyle choices, including:  Daily care of your teeth and gums.  Regular physical activity.  Eating a healthy diet.  Avoiding tobacco and drug use.  Limiting alcohol use.  Practicing safe sex.  Taking low-dose aspirin every day.  Taking vitamin and mineral supplements as recommended by your health care provider. What happens during an annual well check? The  services and screenings done by your health care provider during your annual well check will depend on your age, overall health, lifestyle risk factors, and family history of disease. Counseling  Your health care provider may ask you questions about your:  Alcohol use.  Tobacco use.  Drug use.  Emotional well-being.  Home and relationship well-being.  Sexual activity.  Eating habits.  History of falls.  Memory and ability to understand (cognition).  Work and work Statistician.  Reproductive health. Screening  You may have the following tests or measurements:  Height, weight, and BMI.  Blood pressure.  Lipid and cholesterol levels. These may be checked every 5 years, or more frequently if you are over 64 years old.  Skin check.  Lung cancer screening. You may have this screening every year starting at age 37 if you have a 30-pack-year history of smoking and currently smoke or have quit within the past 15 years.  Fecal occult blood test (FOBT) of the stool. You may have this test every year starting at age 99.  Flexible sigmoidoscopy or colonoscopy. You may have a sigmoidoscopy every 5 years or a colonoscopy every 10 years starting at age 64.  Hepatitis C blood test.  Hepatitis B blood test.  Sexually transmitted disease (STD) testing.  Diabetes screening. This is done by checking your blood sugar (glucose) after you have not eaten for a while (fasting). You may have this done every 1-3 years.  Bone density scan. This is done to screen for osteoporosis. You may have this done starting at age 33.  Mammogram. This may be done every 1-2 years. Talk to your health care provider about how often you should have regular mammograms. Talk with your health care provider about your test results, treatment options, and if necessary, the need for  more tests. Vaccines  Your health care provider may recommend certain vaccines, such as:  Influenza vaccine. This is recommended  every year.  Tetanus, diphtheria, and acellular pertussis (Tdap, Td) vaccine. You may need a Td booster every 10 years.  Zoster vaccine. You may need this after age 69.  Pneumococcal 13-valent conjugate (PCV13) vaccine. One dose is recommended after age 17.  Pneumococcal polysaccharide (PPSV23) vaccine. One dose is recommended after age 58. Talk to your health care provider about which screenings and vaccines you need and how often you need them. This information is not intended to replace advice given to you by your health care provider. Make sure you discuss any questions you have with your health care provider. Document Released: 01/19/2015 Document Revised: 09/12/2015 Document Reviewed: 10/24/2014 Elsevier Interactive Patient Education  2017 [place] Prevention in the Home Falls can cause injuries. They can happen to people of all ages. There are many things you can do to make your home safe and to help prevent falls. What can I do on the outside of my home?  Regularly fix the edges of walkways and driveways and fix any cracks.  Remove anything that might make you trip as you walk through a door, such as a raised step or threshold.  Trim any bushes or trees on the path to your home.  Use bright outdoor lighting.  Clear any walking paths of anything that might make someone trip, such as rocks or tools.  Regularly check to see if handrails are loose or broken. Make sure that both sides of any steps have handrails.  Any raised decks and porches should have guardrails on the edges.  Have any leaves, snow, or ice cleared regularly.  Use sand or salt on walking paths during winter.  Clean up any spills in your garage right away. This includes oil or grease spills. What can I do in the bathroom?  Use night lights.  Install grab bars by the toilet and in the tub and shower. Do not use towel bars as grab bars.  Use non-skid mats or decals in the tub or shower.  If  you need to sit down in the shower, use a plastic, non-slip stool.  Keep the floor dry. Clean up any water that spills on the floor as soon as it happens.  Remove soap buildup in the tub or shower regularly.  Attach bath mats securely with double-sided non-slip rug tape.  Do not have throw rugs and other things on the floor that can make you trip. What can I do in the bedroom?  Use night lights.  Make sure that you have a light by your bed that is easy to reach.  Do not use any sheets or blankets that are too big for your bed. They should not hang down onto the floor.  Have a firm chair that has side arms. You can use this for support while you get dressed.  Do not have throw rugs and other things on the floor that can make you trip. What can I do in the kitchen?  Clean up any spills right away.  Avoid walking on wet floors.  Keep items that you use a lot in easy-to-reach places.  If you need to reach something above you, use a strong step stool that has a grab bar.  Keep electrical cords out of the way.  Do not use floor polish or wax that makes floors slippery. If you must use wax, use non-skid  floor wax.  Do not have throw rugs and other things on the floor that can make you trip. What can I do with my stairs?  Do not leave any items on the stairs.  Make sure that there are handrails on both sides of the stairs and use them. Fix handrails that are broken or loose. Make sure that handrails are as long as the stairways.  Check any carpeting to make sure that it is firmly attached to the stairs. Fix any carpet that is loose or worn.  Avoid having throw rugs at the top or bottom of the stairs. If you do have throw rugs, attach them to the floor with carpet tape.  Make sure that you have a light switch at the top of the stairs and the bottom of the stairs. If you do not have them, ask someone to add them for you. What else can I do to help prevent falls?  Wear shoes  that:  Do not have high heels.  Have rubber bottoms.  Are comfortable and fit you well.  Are closed at the toe. Do not wear sandals.  If you use a stepladder:  Make sure that it is fully opened. Do not climb a closed stepladder.  Make sure that both sides of the stepladder are locked into place.  Ask someone to hold it for you, if possible.  Clearly mark and make sure that you can see:  Any grab bars or handrails.  First and last steps.  Where the edge of each step is.  Use tools that help you move around (mobility aids) if they are needed. These include:  Canes.  Walkers.  Scooters.  Crutches.  Turn on the lights when you go into a dark area. Replace any light bulbs as soon as they burn out.  Set up your furniture so you have a clear path. Avoid moving your furniture around.  If any of your floors are uneven, fix them.  If there are any pets around you, be aware of where they are.  Review your medicines with your doctor. Some medicines can make you feel dizzy. This can increase your chance of falling. Ask your doctor what other things that you can do to help prevent falls. This information is not intended to replace advice given to you by your health care provider. Make sure you discuss any questions you have with your health care provider. Document Released: 10/19/2008 Document Revised: 05/31/2015 Document Reviewed: 01/27/2014 Elsevier Interactive Patient Education  2017 [employer].

## 2019-02-03 ENCOUNTER — Encounter: Payer: Medicare Other | Admitting: Family

## 2019-02-03 ENCOUNTER — Encounter: Payer: Medicare Other | Admitting: Nurse Practitioner

## 2019-02-22 ENCOUNTER — Other Ambulatory Visit: Payer: Self-pay | Admitting: Nurse Practitioner

## 2019-02-22 ENCOUNTER — Other Ambulatory Visit: Payer: Self-pay

## 2019-02-22 ENCOUNTER — Encounter: Payer: Self-pay | Admitting: Nurse Practitioner

## 2019-02-22 ENCOUNTER — Encounter: Payer: Medicare Other | Admitting: Nurse Practitioner

## 2019-02-22 DIAGNOSIS — M720 Palmar fascial fibromatosis [Dupuytren]: Secondary | ICD-10-CM

## 2019-02-22 DIAGNOSIS — M2550 Pain in unspecified joint: Secondary | ICD-10-CM

## 2019-02-22 MED ORDER — OLMESARTAN MEDOXOMIL-HCTZ 20-12.5 MG PO TABS: 1.0000 | ORAL_TABLET | Freq: Every day | ORAL | 1 refills | Status: DC

## 2019-02-22 MED ORDER — PRAVASTATIN SODIUM 40 MG PO TABS: 40.0000 mg | ORAL_TABLET | Freq: Every day | ORAL | 1 refills | Status: DC

## 2019-02-22 NOTE — Telephone Encounter (Signed)
Patient requested refill.  [place] Verified LR: 01/23/2019 Contract updated Pended Rx and sent to [facility] for approval.   Pravastatin Rx pended and sent to [facility] for approval due to [place].

## 2019-02-22 NOTE — Progress Notes (Signed)
   This service is provided via telemedicine  Vital signs were collected/recorded during telemedicine visit and documented by Green.D RMA.   Location of patient (ex: home, work):  Home  Patient consents to a telephone visit:  Yes  Location of the provider (ex: office, home):   [facility]  Name of any referring provider: N/A    Names of all persons participating in the telemedicine service and their role in the encounter: Patient, Laurie Gray, RMA, [name], NP.   Time spent on call:  8 minutes on the phone with Medical Assistant.   Pt opted to schedule in office appt.

## 2019-02-25 ENCOUNTER — Ambulatory Visit: Payer: Medicare Other | Admitting: Nurse Practitioner

## 2019-02-28 ENCOUNTER — Encounter: Payer: Self-pay | Admitting: Nurse Practitioner

## 2019-02-28 ENCOUNTER — Other Ambulatory Visit: Payer: Self-pay

## 2019-02-28 ENCOUNTER — Ambulatory Visit (INDEPENDENT_AMBULATORY_CARE_PROVIDER_SITE_OTHER): Payer: Medicare Other | Admitting: Nurse Practitioner

## 2019-02-28 VITALS — BP 146/82 | HR 76 | Temp 98.0°F | Ht 65.0 in | Wt 131.0 lb

## 2019-02-28 DIAGNOSIS — E213 Hyperparathyroidism, unspecified: Secondary | ICD-10-CM

## 2019-02-28 DIAGNOSIS — F331 Major depressive disorder, recurrent, moderate: Secondary | ICD-10-CM | POA: Diagnosis not present

## 2019-02-28 DIAGNOSIS — M2550 Pain in unspecified joint: Secondary | ICD-10-CM

## 2019-02-28 DIAGNOSIS — M720 Palmar fascial fibromatosis [Dupuytren]: Secondary | ICD-10-CM

## 2019-02-28 DIAGNOSIS — M8580 Other specified disorders of bone density and structure, unspecified site: Secondary | ICD-10-CM

## 2019-02-28 DIAGNOSIS — E039 Hypothyroidism, unspecified: Secondary | ICD-10-CM

## 2019-02-28 DIAGNOSIS — E785 Hyperlipidemia, unspecified: Secondary | ICD-10-CM

## 2019-02-28 DIAGNOSIS — I10 Essential (primary) hypertension: Secondary | ICD-10-CM

## 2019-02-28 DIAGNOSIS — B20 Human immunodeficiency virus [HIV] disease: Secondary | ICD-10-CM | POA: Diagnosis not present

## 2019-02-28 DIAGNOSIS — G8929 Other chronic pain: Secondary | ICD-10-CM

## 2019-02-28 MED ORDER — TRAMADOL HCL 50 MG PO TABS: 50.0000 mg | ORAL_TABLET | Freq: Three times a day (TID) | ORAL | 0 refills | Status: DC | PRN

## 2019-02-28 NOTE — Progress Notes (Signed)
Careteam: Patient Care Team: Lauree Chandler, NP as PCP - General (Geriatric Medicine) Campbell Riches, MD as PCP - Infectious Diseases (Infectious Diseases) Jackolyn Confer, MD as Consulting Physician (General Surgery)  Advanced Directive information Does Patient Have a Medical Advance Directive?: No, Would patient like information on creating a medical advance directive?: Yes (MAU/Ambulatory/Procedural Areas - Information given)(Paperwork given at previous visit)  Allergies  Allergen Reactions  . Crestor [Rosuvastatin Calcium]     myalgias  . Pravastatin     myalgias  . Sulfonamide Derivatives Swelling    SWELLING REACTION UNSPECIFIED     Chief Complaint  Patient presents with  . Follow-up    6 week follow-up   . Medication Refill    Renew cymbalta and odefsey at [facility] on [address], Dr.Ellison rx's thyroid medication, questions if Janett Billow will send rx.   . Medication Management    Discuss why 15 pills provided for Tramadol RX      HPI: Patient is a 64 y.o. female for routine follow up.  Pt with hx of HIV, thyroid cancer s/p thyroidectomy anxiety and depression, chronic joint pain, hyperlipidemia.   Dupuytren contractures- had procedure and now using brace, having increase in pain and ran out of tramadol due to pharmacy only giving her 5 day supply  HIV-stable, following with ID, 1-2 times yearly.   Anxiety- controlled with cymbalta  Chronic pain- ongoing, tramadol 50 mg TID controls pain.   htn-elevated today due to being in pain. Generally at home much lower.   Hyperlipidemia-continues on pravastatin. Fasting today.   Osteopenia- bone density up to date, continues Vit D with weight bearing exercise.   Hypothyroid- last TSH and have changed synthroid several times. Reports she feels like it is too much- having palpitations.  Currently taking synthroid 100 mcg daily.    Review of Systems:  Review of Systems  Constitutional: Negative for chills, fever  and weight loss.  HENT: Negative for tinnitus.   Respiratory: Negative for cough, sputum production and shortness of breath.   Cardiovascular: Negative for chest pain, palpitations and leg swelling.  Gastrointestinal: Negative for abdominal pain, constipation, diarrhea and heartburn.  Genitourinary: Negative for dysuria, frequency and urgency.  Musculoskeletal: Positive for joint pain and myalgias. Negative for back pain.  Skin: Negative.   Neurological: Negative for dizziness and headaches.  Psychiatric/Behavioral: Negative for depression and memory loss. The patient does not have insomnia.     Past Medical History:  Diagnosis Date  . Anemia   . Anxiety    hx of-not on any meds  . Arthritis    "shoulders, arms, fingers, neck, back" (01/31/2016)  . Chronic cervical pain   . Family history of breast cancer   . History of bronchitis   . History of colon polyps   . History of kidney stones   . HIV infection ([place]) dx'd 2000   takes odefsey  . Hyperlipidemia    takes Pravastatin daily  . Hypertension    takes Benicar HCT daily  . Hyperthyroidism   . Hypothyroidism   . Internal hemorrhoids   . Joint pain   . Migraine    "in the past" (01/31/2016)  . Parathyroid adenoma   . Pituitary adenoma ([place])   . Refusal of blood transfusions as patient is Jehovah's Witness    "but substitutes are fine". (01/31/2016)  . Thyroid cancer ([place]) 12/2015  . Upper back pain    Past Surgical History:  Procedure Laterality Date  . ABDOMINAL HYSTERECTOMY    .  BREAST EXCISIONAL BIOPSY Left    no scar seen   . BUNIONECTOMY Right   . COLONOSCOPY    . CYSTOSCOPY    . HAMMER TOE SURGERY Left    "middle toe was broke; turned into hammertoe then repaired"  . HAMMER TOE SURGERY Bilateral    "both small toes"  . PARATHYROIDECTOMY Right 01/31/2016  . PARATHYROIDECTOMY Right 01/31/2016   Procedure: PARATHYROIDECTOMY ON RIGHT SIDE;  Surgeon: Izora Gala, MD;  Location: [facility];  Service: ENT;  Laterality:  Right;  . SALPINGOOPHORECTOMY Bilateral   . THYROIDECTOMY N/A 01/31/2016   Procedure: TOTAL THYROIDECTOMY;  Surgeon: Izora Gala, MD;  Location: [facility];  Service: ENT;  Laterality: N/A;  . TOE SURGERY     bunion  . TONSILLECTOMY     as a child  . TOTAL THYROIDECTOMY  01/31/2016  . TUBAL LIGATION     30 yrs ago   Social History:   reports that she has never smoked. She has never used smokeless tobacco. She reports current alcohol use. She reports that she does not use drugs.  Family History  Problem Relation Age of Onset  . Breast cancer Mother 31  . Hypertension Mother   . Cancer Mother   . Hyperlipidemia Mother   . Stroke Mother   . Other Father        'vocal dysautonomia'  . Breast cancer Sister 42  . Thyroid disease Sister   . Osteoarthritis Maternal Grandmother   . Colon cancer Neg Hx     Medications: Patient's Medications  New Prescriptions   No medications on file  Previous Medications   DULOXETINE (CYMBALTA) 30 MG CAPSULE    TAKE 1 CAPSULE BY MOUTH EVERY DAY   HYDROCORTISONE (PROCTO-PAK) 1 % CREA    Twice daily as needed for hemorrhoids   KRILL OIL 500 MG CAPS    Take by mouth daily.   LEVOTHYROXINE (SYNTHROID) 100 MCG TABLET    TAKE 1 TABLET BY MOUTH DAILY BEFORE BREAKFAST.   MULTIPLE VITAMINS-MINERALS (MULTIVITAMIN ADULTS 50+ PO)    Take 1 tablet by mouth daily.   ODEFSEY 200-25-25 MG TABS TABLET    TAKE 1 TABLET BY MOUTH EVERY DAY WITH BREAKFAST   OLMESARTAN-HYDROCHLOROTHIAZIDE (BENICAR HCT) 20-12.5 MG TABLET    Take 1 tablet by mouth daily.   PRAVASTATIN (PRAVACHOL) 40 MG TABLET    Take 1 tablet (40 mg total) by mouth daily.  Modified Medications   Modified Medication Previous Medication   TRAMADOL (ULTRAM) 50 MG TABLET traMADol (ULTRAM) 50 MG tablet      Take 1 tablet (50 mg total) by mouth every 8 (eight) hours as needed.    Take 1 tablet (50 mg total) by mouth every 8 (eight) hours as needed for up to 5 days.  Discontinued Medications   CYANOCOBALAMIN (B-12  PO)    Take 1 tablet by mouth daily.    Physical Exam:  Vitals:   02/28/19 1131  BP: (!) 146/82  Pulse: 76  Temp: 98 F (36.7 C)  TempSrc: Temporal  SpO2: 98%  Weight: 131 lb (59.4 kg)  Height: 5\' 5"  (1.651 m)   Body mass index is 21.8 kg/m. Wt Readings from Last 3 Encounters:  02/28/19 131 lb (59.4 kg)  02/22/19 130 lb (59 kg)  12/24/18 131 lb (59.4 kg)    Physical Exam Constitutional:      General: She is not in acute distress.    Appearance: She is well-developed. She is not diaphoretic.  HENT:  Head: Normocephalic and atraumatic.  Eyes:     Conjunctiva/sclera: Conjunctivae normal.     Pupils: Pupils are equal, round, and reactive to light.  Cardiovascular:     Rate and Rhythm: Normal rate and regular rhythm.     Heart sounds: Normal heart sounds.  Pulmonary:     Effort: Pulmonary effort is normal.     Breath sounds: Normal breath sounds.  Abdominal:     General: Bowel sounds are normal.     Palpations: Abdomen is soft.  Musculoskeletal:        General: Deformity (to bilateral hands) present. No tenderness.     Cervical back: Normal range of motion and neck supple.  Skin:    General: Skin is warm and dry.  Neurological:     Mental Status: She is alert and oriented to person, place, and time.  Psychiatric:        Mood and Affect: Mood normal.        Behavior: Behavior normal.     Labs reviewed: Basic Metabolic Panel: Recent Labs    03/18/18 1519 08/19/18 1526 09/30/18 1403 10/13/18 1346 12/21/18 1550  NA  --  142 143  --   --   K  --  3.7 4.1  --   --   CL  --  103 105  --   --   CO2  --  31 28  --   --   GLUCOSE  --  100 76  --   --   BUN  --  15 11  --   --   CREATININE  --  0.89 1.03*  --   --   CALCIUM  --  9.8 9.6  --   --   TSH 1.15  --   --  34.85* 0.09*   Liver Function Tests: Recent Labs    08/19/18 1526 09/30/18 1403  AST 19 20  ALT 18 16  BILITOT 0.3 0.4  PROT 6.8 6.8   No results for input(s): LIPASE, AMYLASE in the  last 8760 hours. No results for input(s): AMMONIA in the last 8760 hours. CBC: Recent Labs    08/19/18 1526 09/30/18 1403  WBC 3.8 4.7  NEUTROABS 1,900  --   HGB 12.1 12.1  HCT 36.6 36.8  MCV 96.1 94.6  PLT 192 220   Lipid Panel: Recent Labs    09/30/18 1403  CHOL 246*  HDL 75  LDLCALC 148*  TRIG 117  CHOLHDL 3.3   TSH: Recent Labs    03/18/18 1519 10/13/18 1346 12/21/18 1550  TSH 1.15 34.85* 0.09*   A1C: Lab Results  Component Value Date   HGBA1C 5.6 11/19/2015     Assessment/Plan 1.Moderate episode of recurrent major depressive disorder (HCC) Controlled on cymbalta 30 mg daily, also helps watch her grandchildren which has been keeping her busy, exercises routinely   2. Chronic joint pain Ongoing with chronic OA, frozen shoulders bilaterally, dupuytren contractures s/p repair on the left. Continues on tramadol which gives pain relief. There was some issues with getting medication at her local pharmacy and then was only given 5 day supply however she is on this chronically, refill provided again today.    3. Human immunodeficiency virus (HIV) disease ([place]) Stable, following with ID routinely for follow up. Continues on odefsey daily   4. Hyperparathyroidism ([place]) Followed by endocrinologist.   5. Dupuytren's contracture of both hands Followed by ortho, status post repair on the left. Reports severe pain, ran out of  tramadol over this weekend. Refilled today - traMADol (ULTRAM) 50 MG tablet; Take 1 tablet (50 mg total) by mouth every 8 (eight) hours as needed.  Dispense: 90 tablet; Refill: 0  6. Arthralgia, unspecified joint - traMADol (ULTRAM) 50 MG tablet; Take 1 tablet (50 mg total) by mouth every 8 (eight) hours as needed.  Dispense: 90 tablet; Refill: 0  7. Osteopenia, unspecified location Continues on vit D and weight bearing activity.  10. Essential hypertension, benign -controlled on home readings. Continue current regimen with dietary  modifications.  - Lipid panel - COMPLETE METABOLIC PANEL WITH GFR - CBC with Differential/Platelet  11. Hyperlipidemia, unspecified hyperlipidemia type -continues on pravastatin, has a hard time tolerating statin but doing well at this time.  - Lipid panel - COMPLETE METABOLIC PANEL WITH GFR  12. Hypothyroidism, unspecified type Continues on synthroid 100 mcg but feels like TSH is too low due to palpitations. Will follow up TSH at this time.    Next appt: 4 months. Carlos American. [place], [facility] [phone]

## 2019-03-01 ENCOUNTER — Encounter: Payer: Self-pay | Admitting: Endocrinology

## 2019-03-01 ENCOUNTER — Other Ambulatory Visit: Payer: Self-pay | Admitting: Endocrinology

## 2019-03-01 LAB — CBC WITH DIFFERENTIAL/PLATELET
Absolute Monocytes: 273 cells/uL (ref 200–950)
Basophils Absolute: 40 cells/uL (ref 0–200)
Basophils Relative: 1.3 %
Eosinophils Absolute: 40 cells/uL (ref 15–500)
Eosinophils Relative: 1.3 %
HCT: 37.2 % (ref 35.0–45.0)
Hemoglobin: 12.3 g/dL (ref 11.7–15.5)
Lymphs Abs: 1671 cells/uL (ref 850–3900)
MCH: 30.9 pg (ref 27.0–33.0)
MCHC: 33.1 g/dL (ref 32.0–36.0)
MCV: 93.5 fL (ref 80.0–100.0)
MPV: 11.2 fL (ref 7.5–12.5)
Monocytes Relative: 8.8 %
Neutro Abs: 1076 cells/uL — ABNORMAL LOW (ref 1500–7800)
Neutrophils Relative %: 34.7 %
Platelets: 203 10*3/uL (ref 140–400)
RBC: 3.98 10*6/uL (ref 3.80–5.10)
RDW: 12.9 % (ref 11.0–15.0)
Total Lymphocyte: 53.9 %
WBC: 3.1 10*3/uL — ABNORMAL LOW (ref 3.8–10.8)

## 2019-03-01 LAB — COMPLETE METABOLIC PANEL WITH GFR
AG Ratio: 2 (calc) (ref 1.0–2.5)
ALT: 17 U/L (ref 6–29)
AST: 23 U/L (ref 10–35)
Albumin: 4.4 g/dL (ref 3.6–5.1)
Alkaline phosphatase (APISO): 51 U/L (ref 37–153)
BUN: 13 mg/dL (ref 7–25)
CO2: 28 mmol/L (ref 20–32)
Calcium: 9.5 mg/dL (ref 8.6–10.4)
Chloride: 104 mmol/L (ref 98–110)
Creat: 0.88 mg/dL (ref 0.50–0.99)
GFR, Est African American: 81 mL/min/{1.73_m2} (ref >=60)
GFR, Est Non African American: 70 mL/min/{1.73_m2} (ref >=60)
Globulin: 2.2 g/dL (calc) (ref 1.9–3.7)
Glucose, Bld: 86 mg/dL (ref 65–99)
Potassium: 4.1 mmol/L (ref 3.5–5.3)
Sodium: 141 mmol/L (ref 135–146)
Total Bilirubin: 0.5 mg/dL (ref 0.2–1.2)
Total Protein: 6.6 g/dL (ref 6.1–8.1)

## 2019-03-01 LAB — LIPID PANEL
Cholesterol: 230 mg/dL — ABNORMAL HIGH (ref <200)
HDL: 68 mg/dL (ref >=50)
LDL Cholesterol (Calc): 143 mg/dL (calc) — ABNORMAL HIGH
Non-HDL Cholesterol (Calc): 162 mg/dL (calc) — ABNORMAL HIGH (ref <130)
Total CHOL/HDL Ratio: 3.4 (calc) (ref <5.0)
Triglycerides: 85 mg/dL (ref <150)

## 2019-03-01 LAB — TSH: TSH: 0.17 mIU/L — ABNORMAL LOW (ref 0.40–4.50)

## 2019-03-01 MED ORDER — LEVOTHYROXINE SODIUM 88 MCG PO TABS: 88.0000 ug | ORAL_TABLET | Freq: Every day | ORAL | 3 refills | Status: DC

## 2019-03-09 ENCOUNTER — Other Ambulatory Visit: Payer: Self-pay

## 2019-03-09 ENCOUNTER — Ambulatory Visit
Admission: RE | Admit: 2019-03-09 | Discharge: 2019-03-09 | Disposition: A | Discharge status: Home / Self Care | Payer: Medicare Other | Source: Ambulatory Visit | Attending: Nurse Practitioner | Admitting: Nurse Practitioner

## 2019-03-09 DIAGNOSIS — Z1231 Encounter for screening mammogram for malignant neoplasm of breast: Secondary | ICD-10-CM | POA: Diagnosis not present

## 2019-03-20 ENCOUNTER — Other Ambulatory Visit: Payer: Self-pay | Admitting: Infectious Diseases

## 2019-03-20 DIAGNOSIS — B20 Human immunodeficiency virus [HIV] disease: Secondary | ICD-10-CM

## 2019-03-23 ENCOUNTER — Other Ambulatory Visit: Payer: Self-pay | Admitting: Endocrinology

## 2019-03-23 ENCOUNTER — Other Ambulatory Visit: Payer: Self-pay | Admitting: Nurse Practitioner

## 2019-03-23 DIAGNOSIS — M2550 Pain in unspecified joint: Secondary | ICD-10-CM

## 2019-03-23 DIAGNOSIS — F329 Major depressive disorder, single episode, unspecified: Secondary | ICD-10-CM

## 2019-03-23 DIAGNOSIS — G8929 Other chronic pain: Secondary | ICD-10-CM

## 2019-03-23 DIAGNOSIS — F32A Depression, unspecified: Secondary | ICD-10-CM

## 2019-03-23 NOTE — Telephone Encounter (Signed)
Pended Rx and sent to [facility] for approval due to [place].

## 2019-03-27 ENCOUNTER — Other Ambulatory Visit: Payer: Self-pay | Admitting: Nurse Practitioner

## 2019-03-27 DIAGNOSIS — M720 Palmar fascial fibromatosis [Dupuytren]: Secondary | ICD-10-CM

## 2019-03-27 DIAGNOSIS — M2550 Pain in unspecified joint: Secondary | ICD-10-CM

## 2019-03-28 NOTE — Telephone Encounter (Signed)
Patient pharmacy faxed over refill. Patient has Opioid Contract on file  12/29/2018. Patient last office visit was 02/28/2019. Medication pend and sent to provider [name], NP. Please Advise.

## 2019-04-13 ENCOUNTER — Ambulatory Visit: Payer: Medicare Other | Admitting: Endocrinology

## 2019-04-26 DIAGNOSIS — M720 Palmar fascial fibromatosis [Dupuytren]: Secondary | ICD-10-CM | POA: Diagnosis not present

## 2019-04-27 ENCOUNTER — Other Ambulatory Visit: Payer: Self-pay | Admitting: Nurse Practitioner

## 2019-04-27 DIAGNOSIS — M720 Palmar fascial fibromatosis [Dupuytren]: Secondary | ICD-10-CM

## 2019-04-27 DIAGNOSIS — M2550 Pain in unspecified joint: Secondary | ICD-10-CM

## 2019-04-27 NOTE — Telephone Encounter (Signed)
Last filled in Epic on 03/29/2019  Treatment agreement on file

## 2019-05-27 ENCOUNTER — Other Ambulatory Visit: Payer: Self-pay | Admitting: Nurse Practitioner

## 2019-05-27 DIAGNOSIS — M720 Palmar fascial fibromatosis [Dupuytren]: Secondary | ICD-10-CM

## 2019-05-27 DIAGNOSIS — M2550 Pain in unspecified joint: Secondary | ICD-10-CM

## 2019-05-30 NOTE — Telephone Encounter (Signed)
Last filled 04/28/2019 in epic.  Opioid treatment agreement on file from 12/29/2019

## 2019-06-26 ENCOUNTER — Other Ambulatory Visit: Payer: Self-pay | Admitting: Nurse Practitioner

## 2019-06-26 DIAGNOSIS — M720 Palmar fascial fibromatosis [Dupuytren]: Secondary | ICD-10-CM

## 2019-06-26 DIAGNOSIS — M2550 Pain in unspecified joint: Secondary | ICD-10-CM

## 2019-06-27 NOTE — Telephone Encounter (Signed)
Pharmacy requested Pended Rx and sent to [facility] for approval.

## 2019-06-28 ENCOUNTER — Ambulatory Visit: Payer: Medicare Other | Admitting: Endocrinology

## 2019-06-28 DIAGNOSIS — Z0289 Encounter for other administrative examinations: Secondary | ICD-10-CM

## 2019-06-29 ENCOUNTER — Ambulatory Visit (INDEPENDENT_AMBULATORY_CARE_PROVIDER_SITE_OTHER): Payer: Medicare Other | Admitting: Nurse Practitioner

## 2019-06-29 ENCOUNTER — Encounter: Payer: Self-pay | Admitting: Nurse Practitioner

## 2019-06-29 ENCOUNTER — Other Ambulatory Visit: Payer: Self-pay | Admitting: *Deleted

## 2019-06-29 ENCOUNTER — Other Ambulatory Visit: Payer: Self-pay

## 2019-06-29 VITALS — BP 118/80 | HR 79 | Temp 97.5°F | Ht 65.0 in | Wt 126.0 lb

## 2019-06-29 DIAGNOSIS — M720 Palmar fascial fibromatosis [Dupuytren]: Secondary | ICD-10-CM

## 2019-06-29 DIAGNOSIS — F331 Major depressive disorder, recurrent, moderate: Secondary | ICD-10-CM

## 2019-06-29 DIAGNOSIS — E785 Hyperlipidemia, unspecified: Secondary | ICD-10-CM

## 2019-06-29 DIAGNOSIS — M2550 Pain in unspecified joint: Secondary | ICD-10-CM | POA: Diagnosis not present

## 2019-06-29 DIAGNOSIS — E039 Hypothyroidism, unspecified: Secondary | ICD-10-CM

## 2019-06-29 DIAGNOSIS — J302 Other seasonal allergic rhinitis: Secondary | ICD-10-CM

## 2019-06-29 DIAGNOSIS — I10 Essential (primary) hypertension: Secondary | ICD-10-CM | POA: Diagnosis not present

## 2019-06-29 MED ORDER — OLOPATADINE HCL 0.2 % OP SOLN: 1.0000 [drp] | Freq: Every day | OPHTHALMIC | 0 refills | Status: DC | PRN

## 2019-06-29 MED ORDER — LORATADINE 10 MG PO TABS: 10.0000 mg | ORAL_TABLET | Freq: Every day | ORAL | 11 refills | Status: DC

## 2019-06-29 MED ORDER — OLMESARTAN MEDOXOMIL-HCTZ 20-12.5 MG PO TABS: 1.0000 | ORAL_TABLET | Freq: Every day | ORAL | 1 refills | Status: DC

## 2019-06-29 NOTE — Progress Notes (Signed)
Careteam: Patient Care Team: Lauree Chandler, NP as PCP - General (Geriatric Medicine) Campbell Riches, MD as PCP - Infectious Diseases (Infectious Diseases) Jackolyn Confer, MD as Consulting Physician (General Surgery)  PLACE OF SERVICE:  [place] Directive information Does Patient Have a Medical Advance Directive?: No, Would patient like information on creating a medical advance directive?: No - Patient declined  Allergies  Allergen Reactions  . Crestor [Rosuvastatin Calcium]     myalgias  . Pravastatin     myalgias  . Sulfonamide Derivatives Swelling    SWELLING REACTION UNSPECIFIED     Chief Complaint  Patient presents with  . Medical Management of Chronic Issues    4 month follow up     HPI: Patient is a 64 y.o. female for routine follow up.  Continue to watch her grandchildren.   Hands continue to be "terrible" hand straightened up but then when right back to flexion position. All fingers on right hand contracted, middle finger is the straightest. More pain after injections. Using tramadol 50 mg TID. Supplements with tylenol.   hypothyroid synthroid 88 mcg- following with endocrine this week- feels like 88 mcg is still too much  Hyperlipidemia- tolerating pravastatin   Hypertension- continues on olmesartan- hctz    Review of Systems:  Review of Systems  Constitutional: Negative for chills, fever and weight loss.  Respiratory: Negative for cough, sputum production and shortness of breath.   Cardiovascular: Negative for chest pain, palpitations and leg swelling.  Gastrointestinal: Negative for abdominal pain, constipation, diarrhea and heartburn.  Genitourinary: Negative for dysuria, frequency and urgency.  Musculoskeletal: Positive for joint pain and myalgias. Negative for back pain and falls.  Skin: Negative.   Neurological: Negative for dizziness and headaches.  Psychiatric/Behavioral: Negative for depression and memory loss. The  patient does not have insomnia.     Past Medical History:  Diagnosis Date  . Anemia   . Anxiety    hx of-not on any meds  . Arthritis    "shoulders, arms, fingers, neck, back" (01/31/2016)  . Chronic cervical pain   . Family history of breast cancer   . History of bronchitis   . History of colon polyps   . History of kidney stones   . HIV infection ([place]) dx'd 2000   takes odefsey  . Hyperlipidemia    takes Pravastatin daily  . Hypertension    takes Benicar HCT daily  . Hyperthyroidism   . Hypothyroidism   . Internal hemorrhoids   . Joint pain   . Migraine    "in the past" (01/31/2016)  . Parathyroid adenoma   . Pituitary adenoma ([place])   . Refusal of blood transfusions as patient is Jehovah's Witness    "but substitutes are fine". (01/31/2016)  . Thyroid cancer ([place]) 12/2015  . Upper back pain    Past Surgical History:  Procedure Laterality Date  . ABDOMINAL HYSTERECTOMY    . BREAST EXCISIONAL BIOPSY Left    no scar seen   . BUNIONECTOMY Right   . COLONOSCOPY    . CYSTOSCOPY    . HAMMER TOE SURGERY Left    "middle toe was broke; turned into hammertoe then repaired"  . HAMMER TOE SURGERY Bilateral    "both small toes"  . PARATHYROIDECTOMY Right 01/31/2016  . PARATHYROIDECTOMY Right 01/31/2016   Procedure: PARATHYROIDECTOMY ON RIGHT SIDE;  Surgeon: Izora Gala, MD;  Location: [facility];  Service: ENT;  Laterality: Right;  . SALPINGOOPHORECTOMY Bilateral   .  THYROIDECTOMY N/A 01/31/2016   Procedure: TOTAL THYROIDECTOMY;  Surgeon: Izora Gala, MD;  Location: [facility];  Service: ENT;  Laterality: N/A;  . TOE SURGERY     bunion  . TONSILLECTOMY     as a child  . TOTAL THYROIDECTOMY  01/31/2016  . TUBAL LIGATION     30 yrs ago   Social History:   reports that she has never smoked. She has never used smokeless tobacco. She reports current alcohol use. She reports that she does not use drugs.  Family History  Problem Relation Age of Onset  . Breast cancer Mother 63  .  Hypertension Mother   . Cancer Mother   . Hyperlipidemia Mother   . Stroke Mother   . Other Father        'vocal dysautonomia'  . Breast cancer Sister 20  . Thyroid disease Sister   . Osteoarthritis Maternal Grandmother   . Colon cancer Neg Hx     Medications: Patient's Medications  New Prescriptions   No medications on file  Previous Medications   DULOXETINE (CYMBALTA) 30 MG CAPSULE    TAKE 1 CAPSULE BY MOUTH EVERY DAY   HYDROCORTISONE (PROCTO-PAK) 1 % CREA    Twice daily as needed for hemorrhoids   KRILL OIL 500 MG CAPS    Take by mouth daily.   LEVOTHYROXINE (SYNTHROID) 88 MCG TABLET    Take 1 tablet (88 mcg total) by mouth daily before breakfast.   MULTIPLE VITAMINS-MINERALS (MULTIVITAMIN ADULTS 50+ PO)    Take 1 tablet by mouth daily.   ODEFSEY 200-25-25 MG TABS TABLET    TAKE 1 TABLET BY MOUTH EVERY DAY WITH BREAKFAST   PRAVASTATIN (PRAVACHOL) 40 MG TABLET    Take 1 tablet (40 mg total) by mouth daily.   TRAMADOL (ULTRAM) 50 MG TABLET    TAKE 1 TABLET (50 MG TOTAL) BY MOUTH EVERY 8 (EIGHT) HOURS AS NEEDED.  Modified Medications   Modified Medication Previous Medication   OLMESARTAN-HYDROCHLOROTHIAZIDE (BENICAR HCT) 20-12.5 MG TABLET olmesartan-hydrochlorothiazide (BENICAR HCT) 20-12.5 MG tablet      Take 1 tablet by mouth daily.    TAKE 1 TABLET BY MOUTH EVERY DAY  Discontinued Medications   LEVOTHYROXINE (SYNTHROID) 100 MCG TABLET    TAKE 1 TABLET BY MOUTH DAILY BEFORE BREAKFAST.    Physical Exam:  Vitals:   06/29/19 1107  BP: 118/80  Pulse: 79  Temp: (!) 97.5 F (36.4 C)  TempSrc: Temporal  SpO2: 97%  Weight: 126 lb (57.2 kg)  Height: 5\' 5"  (1.651 m)   Body mass index is 20.97 kg/m. Wt Readings from Last 3 Encounters:  06/29/19 126 lb (57.2 kg)  02/28/19 131 lb (59.4 kg)  02/22/19 130 lb (59 kg)    Physical Exam Constitutional:      General: She is not in acute distress.    Appearance: She is well-developed. She is not diaphoretic.  HENT:     Head:  Normocephalic and atraumatic.     Mouth/Throat:     Pharynx: No oropharyngeal exudate.  Eyes:     Conjunctiva/sclera: Conjunctivae normal.     Pupils: Pupils are equal, round, and reactive to light.  Cardiovascular:     Rate and Rhythm: Normal rate and regular rhythm.     Heart sounds: Normal heart sounds.  Pulmonary:     Effort: Pulmonary effort is normal.     Breath sounds: Normal breath sounds.  Abdominal:     General: Bowel sounds are normal.  Palpations: Abdomen is soft.  Musculoskeletal:        General: No tenderness.     Cervical back: Normal range of motion and neck supple.  Skin:    General: Skin is warm and dry.  Neurological:     Mental Status: She is alert and oriented to person, place, and time.     Labs reviewed: Basic Metabolic Panel: Recent Labs    08/19/18 1526 09/30/18 1403 10/13/18 1346 12/21/18 1550 02/28/19 1226  NA 142 143  --   --  141  K 3.7 4.1  --   --  4.1  CL 103 105  --   --  104  CO2 31 28  --   --  28  GLUCOSE 100 76  --   --  86  BUN 15 11  --   --  13  CREATININE 0.89 1.03*  --   --  0.88  CALCIUM 9.8 9.6  --   --  9.5  TSH  --   --  34.85* 0.09* 0.17*   Liver Function Tests: Recent Labs    08/19/18 1526 09/30/18 1403 02/28/19 1226  AST 19 20 23   ALT 18 16 17   BILITOT 0.3 0.4 0.5  PROT 6.8 6.8 6.6   No results for input(s): LIPASE, AMYLASE in the last 8760 hours. No results for input(s): AMMONIA in the last 8760 hours. CBC: Recent Labs    08/19/18 1526 09/30/18 1403 02/28/19 1226  WBC 3.8 4.7 3.1*  NEUTROABS 1,900  --  1,076*  HGB 12.1 12.1 12.3  HCT 36.6 36.8 37.2  MCV 96.1 94.6 93.5  PLT 192 220 203   Lipid Panel: Recent Labs    09/30/18 1403 02/28/19 1226  CHOL 246* 230*  HDL 75 68  LDLCALC 148* 143*  TRIG 117 85  CHOLHDL 3.3 3.4   TSH: Recent Labs    10/13/18 1346 12/21/18 1550 02/28/19 1226  TSH 34.85* 0.09* 0.17*   A1C: Lab Results  Component Value Date   HGBA1C 5.6 11/19/2015      Assessment/Plan 1. Seasonal allergies -worsening allergies noted to eyes as well as runny nose.  - Olopatadine HCl 0.2 % SOLN; Apply 1 drop to eye daily as needed.  Dispense: 2.5 mL; Refill: 0 - loratadine (CLARITIN) 10 MG tablet; Take 1 tablet (10 mg total) by mouth daily.  Dispense: 30 tablet; Refill: 11  2. Moderate episode of recurrent major depressive disorder (HCC) Stable, continues on Cymbalta   3. Dupuytren's contracture of both hands Ongoing, procedure by ortho was not successful. Uses tramadol as needed but tries to reserve for severe pain. Also educated that she can use tylenol as well to help supplement tramadol for better pain relief.   4. Arthralgia, unspecified joint Stable, continues tylenol and tramdol PRN  5. Essential hypertension, benign Stable on olmesartan-hctz with dietary modifications.   6. Hypothyroidism, unspecified type -TSH low on last check she has follow up with endocrinology with lab later this week.  7. Hyperlipidemia, unspecified hyperlipidemia type Continues on pravastatin with dietary modifications.   Next appt: 4 months.  Carlos American. [place], [facility] [phone]

## 2019-06-29 NOTE — Patient Instructions (Signed)
To use tylenol as needed for pain- can use in additional to tramadol to help with pain  For allergies (sent to pharmacy)  - PATADAY drops Olopatadine HCl 0.2 % SOLN; Apply 1 drop to eye daily as needed.  Dispense: 2.5 mL; Refill: 0 - loratadine (CLARITIN) 10 MG tablet; Take 1 tablet (10 mg total) by mouth daily.  Dispense: 30 tablet; Refill: 11

## 2019-06-29 NOTE — Telephone Encounter (Signed)
Patient requested refill Epic LR: 05/30/19 Pended Rx and sent to [facility] for approval.

## 2019-07-01 ENCOUNTER — Ambulatory Visit (INDEPENDENT_AMBULATORY_CARE_PROVIDER_SITE_OTHER): Payer: Medicare Other | Admitting: Endocrinology

## 2019-07-01 ENCOUNTER — Other Ambulatory Visit: Payer: Self-pay

## 2019-07-01 ENCOUNTER — Encounter: Payer: Self-pay | Admitting: Endocrinology

## 2019-07-01 VITALS — BP 116/70 | HR 74 | Ht 65.0 in | Wt 126.6 lb

## 2019-07-01 DIAGNOSIS — E890 Postprocedural hypothyroidism: Secondary | ICD-10-CM

## 2019-07-01 DIAGNOSIS — C73 Malignant neoplasm of thyroid gland: Secondary | ICD-10-CM | POA: Diagnosis not present

## 2019-07-01 LAB — TSH: TSH: 0.07 u[IU]/mL — ABNORMAL LOW (ref 0.35–4.50)

## 2019-07-01 LAB — T4, FREE: Free T4: 1.18 ng/dL (ref 0.60–1.60)

## 2019-07-01 MED ORDER — LEVOTHYROXINE SODIUM 75 MCG PO TABS: 75.0000 ug | ORAL_TABLET | Freq: Every day | ORAL | 1 refills | Status: DC

## 2019-07-01 NOTE — Progress Notes (Signed)
Subjective:    Patient ID: Laurie Gray, female    DOB: March 10, 1955, 64 y.o.   MRN: [id number]  HPI Pt returns for f/u stage 1 of differentiated thyroid cancer, with this chronology: 1/18: thyroidectomy: PAPILLARY CARCINOMA, 0.9 CM. - FOCAL MICROSCOPIC EXTRATHYROIDAL EXTENSION PRESENT; INVOLVES RESECTION MARGIN.   3/18: RAI rx with thyrogen, 109 mCi 3/18: post-therapy scan: residual uptake in the thyroid bed. No evidence for distant metastatic disease.   1/19: TG=0.1 (Ab neg) 2/20: TG=0.1 (Ab neg) 10/20: TG=0.1 (Ab neg) Pt also has pituitary cyst (dx'ed 2005, when MRI showed 3.5 x 6.5 mm cyst; f/u in 2020 was unchanged).   She takes synthroid as rx'ed.  She still has intermitt palpitations and anxiety.   Past Medical History:  Diagnosis Date  . Anemia   . Anxiety    hx of-not on any meds  . Arthritis    "shoulders, arms, fingers, neck, back" (01/31/2016)  . Chronic cervical pain   . Family history of breast cancer   . History of bronchitis   . History of colon polyps   . History of kidney stones   . HIV infection ([place]) dx'd 2000   takes odefsey  . Hyperlipidemia    takes Pravastatin daily  . Hypertension    takes Benicar HCT daily  . Hyperthyroidism   . Hypothyroidism   . Internal hemorrhoids   . Joint pain   . Migraine    "in the past" (01/31/2016)  . Parathyroid adenoma   . Pituitary adenoma ([place])   . Refusal of blood transfusions as patient is Jehovah's Witness    "but substitutes are fine". (01/31/2016)  . Thyroid cancer ([place]) 12/2015  . Upper back pain     Past Surgical History:  Procedure Laterality Date  . ABDOMINAL HYSTERECTOMY    . BREAST EXCISIONAL BIOPSY Left    no scar seen   . BUNIONECTOMY Right   . COLONOSCOPY    . CYSTOSCOPY    . HAMMER TOE SURGERY Left    "middle toe was broke; turned into hammertoe then repaired"  . HAMMER TOE SURGERY Bilateral    "both small toes"  . PARATHYROIDECTOMY Right 01/31/2016  . PARATHYROIDECTOMY Right  01/31/2016   Procedure: PARATHYROIDECTOMY ON RIGHT SIDE;  Surgeon: Izora Gala, MD;  Location: [facility];  Service: ENT;  Laterality: Right;  . SALPINGOOPHORECTOMY Bilateral   . THYROIDECTOMY N/A 01/31/2016   Procedure: TOTAL THYROIDECTOMY;  Surgeon: Izora Gala, MD;  Location: [facility];  Service: ENT;  Laterality: N/A;  . TOE SURGERY     bunion  . TONSILLECTOMY     as a child  . TOTAL THYROIDECTOMY  01/31/2016  . TUBAL LIGATION     30 yrs ago    Social History   Socioeconomic History  . Marital status: Widowed    Spouse name: Not on file  . Number of children: Not on file  . Years of education: Not on file  . Highest education level: Not on file  Occupational History  . Not on file  Tobacco Use  . Smoking status: Never Smoker  . Smokeless tobacco: Never Used  Vaping Use  . Vaping Use: Never used  Substance and Sexual Activity  . Alcohol use: Yes    Alcohol/week: 0.0 standard drinks    Comment: 01/31/2016 "might go out to eat and have a drink once/month; no more than that"  . Drug use: No  . Sexual activity: Not Currently    Partners: Male  Comment: declined condoms  Other Topics Concern  . Not on file  Social History Narrative   Normal diet   Drinks Caffeine- coffee/coke/chocolate    Lives in an apartment/ third level/1 person/1 pet   Past profession- Museum/gallery exhibitions officer, cosmetologist   Exercises 3-4 x weekly (cardio and weights)       Social Determinants of Health   Financial Resource Strain:   . Difficulty of Paying Living Expenses:   Food Insecurity:   . Worried About Charity fundraiser in the Last Year:   . Arboriculturist in the Last Year:   Transportation Needs:   . Film/video editor (Medical):   Marland Kitchen Lack of Transportation (Non-Medical):   Physical Activity:   . Days of Exercise per Week:   . Minutes of Exercise per Session:   Stress:   . Feeling of Stress :   Social Connections:   . Frequency of Communication with Friends and Family:   . Frequency of  Social Gatherings with Friends and Family:   . Attends Religious Services:   . Active Member of Clubs or Organizations:   . Attends Archivist Meetings:   Marland Kitchen Marital Status:   Intimate Partner Violence:   . Fear of Current or Ex-Partner:   . Emotionally Abused:   Marland Kitchen Physically Abused:   . Sexually Abused:     Current Outpatient Medications on File Prior to Visit  Medication Sig Dispense Refill  . DULoxetine (CYMBALTA) 30 MG capsule TAKE 1 CAPSULE BY MOUTH EVERY DAY 90 capsule 1  . hydrocortisone (PROCTO-PAK) 1 % CREA Twice daily as needed for hemorrhoids 28.35 g 1  . Krill Oil 500 MG CAPS Take by mouth daily.    Marland Kitchen loratadine (CLARITIN) 10 MG tablet Take 1 tablet (10 mg total) by mouth daily. 30 tablet 11  . Multiple Vitamins-Minerals (MULTIVITAMIN ADULTS 50+ PO) Take 1 tablet by mouth daily.    . ODEFSEY 200-25-25 MG TABS tablet TAKE 1 TABLET BY MOUTH EVERY DAY WITH BREAKFAST 90 tablet 1  . olmesartan-hydrochlorothiazide (BENICAR HCT) 20-12.5 MG tablet Take 1 tablet by mouth daily. 90 tablet 1  . Olopatadine HCl 0.2 % SOLN Apply 1 drop to eye daily as needed. 2.5 mL 0  . pravastatin (PRAVACHOL) 40 MG tablet TAKE 1 TABLET BY MOUTH EVERY DAY 90 tablet 1  . traMADol (ULTRAM) 50 MG tablet TAKE 1 TABLET (50 MG TOTAL) BY MOUTH EVERY 8 (EIGHT) HOURS AS NEEDED. 90 tablet 0   No current facility-administered medications on file prior to visit.    Allergies  Allergen Reactions  . Crestor [Rosuvastatin Calcium]     myalgias  . Pravastatin     myalgias  . Sulfonamide Derivatives Swelling    SWELLING REACTION UNSPECIFIED     Family History  Problem Relation Age of Onset  . Breast cancer Mother 76  . Hypertension Mother   . Cancer Mother   . Hyperlipidemia Mother   . Stroke Mother   . Other Father        'vocal dysautonomia'  . Breast cancer Sister 67  . Thyroid disease Sister   . Osteoarthritis Maternal Grandmother   . Colon cancer Neg Hx     BP 116/70   Pulse 74    Ht _0  (1.651 m)   Wt 126 lb 9.6 oz (57.4 kg)   SpO2 98%   BMI 21.07 kg/m    Review of Systems     Objective:   Physical Exam VITAL SIGNS:  See vs page GENERAL: no distress Neck: a healed scar is present.  I do not appreciate a nodule in the thyroid or elsewhere in the neck  Lab Results  Component Value Date   TSH 0.07 (L) 07/01/2019       Assessment & Plan:  Hypothyroidism: overeplaced.  Reduce synthroid.  Palpitations: in this setting, we might have to shoot for a normal TSH, but we'll try reducing synthroid in stages. PTC: recheck today.    Patient Instructions  Blood tests are requested for you today.  We'll let you know about the results.  Please come back for a follow-up appointment in 6 months.

## 2019-07-01 NOTE — Patient Instructions (Signed)
Blood tests are requested for you today.  We'll let you know about the results.  Please come back for a follow-up appointment in 6 months.   

## 2019-07-04 LAB — THYROGLOBULIN LEVEL: Thyroglobulin: 0.1 ng/mL — ABNORMAL LOW

## 2019-07-04 LAB — THYROGLOBULIN ANTIBODY: Thyroglobulin Ab: <1 IU/mL (ref <=1)

## 2019-07-10 ENCOUNTER — Other Ambulatory Visit: Payer: Self-pay | Admitting: Nurse Practitioner

## 2019-07-10 DIAGNOSIS — J302 Other seasonal allergic rhinitis: Secondary | ICD-10-CM

## 2019-07-12 NOTE — Telephone Encounter (Signed)
RX was last filled on 06/29/2019, please advise if we can give a bigger dispense number and refills

## 2019-07-25 ENCOUNTER — Other Ambulatory Visit: Payer: Self-pay

## 2019-07-25 ENCOUNTER — Other Ambulatory Visit: Payer: Self-pay | Admitting: Nurse Practitioner

## 2019-07-25 ENCOUNTER — Other Ambulatory Visit (HOSPITAL_COMMUNITY)
Admission: RE | Admit: 2019-07-25 | Discharge: 2019-07-25 | Disposition: A | Discharge status: Home / Self Care | Payer: Medicare Other | Source: Ambulatory Visit | Attending: Infectious Diseases | Admitting: Infectious Diseases

## 2019-07-25 ENCOUNTER — Other Ambulatory Visit: Payer: Medicare Other

## 2019-07-25 DIAGNOSIS — Z113 Encounter for screening for infections with a predominantly sexual mode of transmission: Secondary | ICD-10-CM

## 2019-07-25 DIAGNOSIS — Z79899 Other long term (current) drug therapy: Secondary | ICD-10-CM

## 2019-07-25 DIAGNOSIS — B20 Human immunodeficiency virus [HIV] disease: Secondary | ICD-10-CM

## 2019-07-25 DIAGNOSIS — M2550 Pain in unspecified joint: Secondary | ICD-10-CM

## 2019-07-25 DIAGNOSIS — M720 Palmar fascial fibromatosis [Dupuytren]: Secondary | ICD-10-CM

## 2019-07-26 LAB — URINE CYTOLOGY ANCILLARY ONLY
Chlamydia: NEGATIVE
Comment: NEGATIVE
Comment: NORMAL
Neisseria Gonorrhea: NEGATIVE

## 2019-07-26 LAB — T-HELPER CELL (CD4) - (RCID CLINIC ONLY)
CD4 % Helper T Cell: 41 % (ref 33–65)
CD4 T Cell Abs: 830 /uL (ref 400–1790)

## 2019-07-26 NOTE — Telephone Encounter (Signed)
RX last filled 06/29/2019  Opioid treatment agreement on file from 12/24/2018

## 2019-07-28 LAB — COMPREHENSIVE METABOLIC PANEL
AG Ratio: 1.5 (calc) (ref 1.0–2.5)
ALT: 21 U/L (ref 6–29)
AST: 24 U/L (ref 10–35)
Albumin: 4 g/dL (ref 3.6–5.1)
Alkaline phosphatase (APISO): 49 U/L (ref 37–153)
BUN: 12 mg/dL (ref 7–25)
CO2: 31 mmol/L (ref 20–32)
Calcium: 9.6 mg/dL (ref 8.6–10.4)
Chloride: 103 mmol/L (ref 98–110)
Creat: 0.98 mg/dL (ref 0.50–0.99)
Globulin: 2.6 g/dL (calc) (ref 1.9–3.7)
Glucose, Bld: 102 mg/dL — ABNORMAL HIGH (ref 65–99)
Potassium: 4.1 mmol/L (ref 3.5–5.3)
Sodium: 142 mmol/L (ref 135–146)
Total Bilirubin: 0.3 mg/dL (ref 0.2–1.2)
Total Protein: 6.6 g/dL (ref 6.1–8.1)

## 2019-07-28 LAB — LIPID PANEL
Cholesterol: 219 mg/dL — ABNORMAL HIGH (ref <200)
HDL: 67 mg/dL (ref >=50)
LDL Cholesterol (Calc): 129 mg/dL (calc) — ABNORMAL HIGH
Non-HDL Cholesterol (Calc): 152 mg/dL (calc) — ABNORMAL HIGH (ref <130)
Total CHOL/HDL Ratio: 3.3 (calc) (ref <5.0)
Triglycerides: 123 mg/dL (ref <150)

## 2019-07-28 LAB — CBC
HCT: 35.5 % (ref 35.0–45.0)
Hemoglobin: 11.5 g/dL — ABNORMAL LOW (ref 11.7–15.5)
MCH: 30.5 pg (ref 27.0–33.0)
MCHC: 32.4 g/dL (ref 32.0–36.0)
MCV: 94.2 fL (ref 80.0–100.0)
MPV: 11.6 fL (ref 7.5–12.5)
Platelets: 176 10*3/uL (ref 140–400)
RBC: 3.77 10*6/uL — ABNORMAL LOW (ref 3.80–5.10)
RDW: 13.1 % (ref 11.0–15.0)
WBC: 3.9 10*3/uL (ref 3.8–10.8)

## 2019-07-28 LAB — HIV-1 RNA QUANT-NO REFLEX-BLD
HIV 1 RNA Quant: <20 copies/mL
HIV-1 RNA Quant, Log: <1.3 Log copies/mL

## 2019-07-28 LAB — RPR: RPR Ser Ql: NONREACTIVE

## 2019-08-09 ENCOUNTER — Encounter: Payer: Self-pay | Admitting: Infectious Diseases

## 2019-08-09 ENCOUNTER — Ambulatory Visit (INDEPENDENT_AMBULATORY_CARE_PROVIDER_SITE_OTHER): Payer: Medicare Other | Admitting: Infectious Diseases

## 2019-08-09 ENCOUNTER — Other Ambulatory Visit: Payer: Self-pay

## 2019-08-09 VITALS — BP 119/73 | HR 66 | Temp 98.8°F | Wt 129.0 lb

## 2019-08-09 DIAGNOSIS — Z113 Encounter for screening for infections with a predominantly sexual mode of transmission: Secondary | ICD-10-CM

## 2019-08-09 DIAGNOSIS — H6121 Impacted cerumen, right ear: Secondary | ICD-10-CM

## 2019-08-09 DIAGNOSIS — I10 Essential (primary) hypertension: Secondary | ICD-10-CM

## 2019-08-09 DIAGNOSIS — C73 Malignant neoplasm of thyroid gland: Secondary | ICD-10-CM

## 2019-08-09 DIAGNOSIS — Z79899 Other long term (current) drug therapy: Secondary | ICD-10-CM

## 2019-08-09 DIAGNOSIS — B20 Human immunodeficiency virus [HIV] disease: Secondary | ICD-10-CM

## 2019-08-09 DIAGNOSIS — N63 Unspecified lump in unspecified breast: Secondary | ICD-10-CM | POA: Diagnosis not present

## 2019-08-09 DIAGNOSIS — H6120 Impacted cerumen, unspecified ear: Secondary | ICD-10-CM | POA: Insufficient documentation

## 2019-08-09 DIAGNOSIS — M720 Palmar fascial fibromatosis [Dupuytren]: Secondary | ICD-10-CM | POA: Diagnosis not present

## 2019-08-09 NOTE — Progress Notes (Signed)
° °  Subjective:    Patient ID: Laurie Gray, female    DOB: 12/23/1955, 64 y.o.   MRN: [id number]  HPI 64yo F with hx of dx 2000 HIV+, depression, hyperlipidemia, prev lap hysterecomy-BSO (2009). Previouslyon D4T monotherapy,atripla --> odefsy.   Has had f/u with GYN, nl PAP 05-2016. Has had previous abn on colpo (SIL). Hasn't had since her prev GYN retired. No issues though.   She had mammo 02-2017 which showed R cysts.U/s 08-2017: Six-month follow-up bilateral diagnostic mammogram and right breast ultrasound is recommended to continue follow-up of the probably benign mass at 2 o'clock, 3 cm from the nipple. U/s 02-2018: Resolution of the previously seen probably benign mass at 2 o'clock 1 cm from the nipple. Persistent cyst at 2 o'clock, 3 cm from nipple benign in appearance. No evidence of malignancy in either breast. RECOMMENDATION: Annual screening mammography.  Had colon 2013.Hadstool test (-) x 2 this 2019.   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  She also got radiocative Iodine Has had Endo f/u in the last 2 months. Feels better with last med adjustment.   Today she is "depressed"- due to her hands (dupteryn's contractures). She was seen by hand surgery but does not want hand surgery.   HIV 1 RNA Quant (copies/mL)  Date Value  07/25/2019 <20 NOT DETECTED  09/30/2018 <20 NOT DETECTED  12/10/2017 <20 NOT DETECTED   CD4 T Cell Abs (/uL)  Date Value  07/25/2019 830  09/30/2018 621  12/10/2017 460   Doing well with odefsy.  Worried that her Glc and lipids were up. Explained these to her (Glc 102, Chol low 200s).    Review of Systems  Constitutional: Negative for appetite change and unexpected weight change.  Respiratory: Negative for cough and shortness of breath.   Gastrointestinal: Negative for constipation and diarrhea.  Genitourinary: Negative for difficulty urinating.  Please see HPI. All other systems  reviewed and negative.      Objective:   Physical Exam Vitals reviewed.  Constitutional:      Appearance: Normal appearance.  HENT:     Right Ear: There is impacted cerumen.     Left Ear: Ear canal normal.     Mouth/Throat:     Mouth: Mucous membranes are moist.     Pharynx: No oropharyngeal exudate.  Cardiovascular:     Rate and Rhythm: Normal rate and regular rhythm.  Pulmonary:     Effort: Pulmonary effort is normal.     Breath sounds: Normal breath sounds.  Abdominal:     General: Bowel sounds are normal. There is no distension.     Palpations: Abdomen is soft.     Tenderness: There is no abdominal tenderness.  Musculoskeletal:     Cervical back: Normal range of motion and neck supple. No tenderness.     Right lower leg: No edema.     Left lower leg: No edema.  Lymphadenopathy:     Cervical: No cervical adenopathy.  Neurological:     General: No focal deficit present.     Mental Status: She is alert.  Psychiatric:        Mood and Affect: Mood normal.           Assessment & Plan:

## 2019-08-09 NOTE — Assessment & Plan Note (Signed)
She is doing well Offered/refused condoms.  Needs Tdap, defers today Will send for mammo/pap Will see her back in 9 months with labs.

## 2019-08-09 NOTE — Assessment & Plan Note (Signed)
Well controlled today apprecaite PCP f/u

## 2019-08-09 NOTE — Assessment & Plan Note (Signed)
Partially debrided on R. Suggested continued use of debrox, warm, soapy water.

## 2019-08-09 NOTE — Assessment & Plan Note (Signed)
Appreciate Dr Cordelia Pen f/u.  Appears to be doing well.

## 2019-08-09 NOTE — Assessment & Plan Note (Signed)
Encouraged her to have surgery.

## 2019-08-09 NOTE — Assessment & Plan Note (Signed)
Will send for mammo

## 2019-08-11 ENCOUNTER — Encounter: Payer: Medicare Other | Admitting: Infectious Diseases

## 2019-08-16 ENCOUNTER — Telehealth: Payer: Self-pay

## 2019-08-16 NOTE — Telephone Encounter (Addendum)
Incoming fax received from [facility] stating based on patient's prescription refill history we are concerned that the patient may be non compliant to the prescribed dosing regimen for Benicar/HCT 20-12.5 mg  Per Lauree Chandler, NP, call patient to confirm adherence to taking medication as prescribed.  I spoke with patient and she states she takes this medication religiously as prescribed.

## 2019-08-18 ENCOUNTER — Ambulatory Visit: Payer: Medicare Other | Admitting: Infectious Diseases

## 2019-08-22 ENCOUNTER — Other Ambulatory Visit: Payer: Self-pay | Admitting: Nurse Practitioner

## 2019-08-22 DIAGNOSIS — M720 Palmar fascial fibromatosis [Dupuytren]: Secondary | ICD-10-CM

## 2019-08-22 DIAGNOSIS — M2550 Pain in unspecified joint: Secondary | ICD-10-CM

## 2019-08-23 NOTE — Telephone Encounter (Signed)
RX last filled on 07/28/2019, treatment agreement on file from 12/24/2019

## 2019-09-17 ENCOUNTER — Other Ambulatory Visit: Payer: Self-pay | Admitting: Nurse Practitioner

## 2019-09-17 DIAGNOSIS — M720 Palmar fascial fibromatosis [Dupuytren]: Secondary | ICD-10-CM

## 2019-09-17 DIAGNOSIS — M2550 Pain in unspecified joint: Secondary | ICD-10-CM

## 2019-09-26 ENCOUNTER — Other Ambulatory Visit: Payer: Self-pay | Admitting: Nurse Practitioner

## 2019-09-26 DIAGNOSIS — M2550 Pain in unspecified joint: Secondary | ICD-10-CM

## 2019-09-26 DIAGNOSIS — M720 Palmar fascial fibromatosis [Dupuytren]: Secondary | ICD-10-CM

## 2019-10-11 ENCOUNTER — Other Ambulatory Visit: Payer: Self-pay | Admitting: Nurse Practitioner

## 2019-10-11 ENCOUNTER — Other Ambulatory Visit: Payer: Self-pay | Admitting: Endocrinology

## 2019-10-11 DIAGNOSIS — F419 Anxiety disorder, unspecified: Secondary | ICD-10-CM

## 2019-10-11 DIAGNOSIS — M2550 Pain in unspecified joint: Secondary | ICD-10-CM

## 2019-10-11 DIAGNOSIS — G8929 Other chronic pain: Secondary | ICD-10-CM

## 2019-10-11 DIAGNOSIS — F32A Depression, unspecified: Secondary | ICD-10-CM

## 2019-10-11 NOTE — Telephone Encounter (Signed)
Patient is requesting refill. Patient has upcoming appointment 11/02/2019. Patient has drug interaction. Medication pend and sent to provider Lauree Chandler, NP. Please Advise.

## 2019-10-16 DIAGNOSIS — Z20822 Contact with and (suspected) exposure to covid-19: Secondary | ICD-10-CM | POA: Diagnosis not present

## 2019-10-28 ENCOUNTER — Other Ambulatory Visit: Payer: Self-pay | Admitting: Nurse Practitioner

## 2019-10-28 ENCOUNTER — Ambulatory Visit: Payer: Medicare Other | Admitting: Nurse Practitioner

## 2019-10-28 DIAGNOSIS — M2550 Pain in unspecified joint: Secondary | ICD-10-CM

## 2019-10-28 DIAGNOSIS — M720 Palmar fascial fibromatosis [Dupuytren]: Secondary | ICD-10-CM

## 2019-10-31 ENCOUNTER — Other Ambulatory Visit: Payer: Self-pay | Admitting: *Deleted

## 2019-10-31 DIAGNOSIS — M720 Palmar fascial fibromatosis [Dupuytren]: Secondary | ICD-10-CM

## 2019-10-31 DIAGNOSIS — M2550 Pain in unspecified joint: Secondary | ICD-10-CM

## 2019-10-31 MED ORDER — TRAMADOL HCL 50 MG PO TABS: 50.0000 mg | ORAL_TABLET | Freq: Three times a day (TID) | ORAL | 0 refills | Status: DC | PRN

## 2019-10-31 NOTE — Telephone Encounter (Signed)
Patient requested refill Epic LR: 09/26/2019 Contract on [facility] and sent to [place] for approval.

## 2019-11-02 ENCOUNTER — Encounter: Payer: Self-pay | Admitting: Nurse Practitioner

## 2019-11-02 ENCOUNTER — Ambulatory Visit (INDEPENDENT_AMBULATORY_CARE_PROVIDER_SITE_OTHER): Payer: Medicare Other | Admitting: Nurse Practitioner

## 2019-11-02 ENCOUNTER — Other Ambulatory Visit: Payer: Self-pay

## 2019-11-02 VITALS — BP 122/70 | HR 64 | Temp 97.1°F | Ht 65.0 in | Wt 130.0 lb

## 2019-11-02 DIAGNOSIS — J302 Other seasonal allergic rhinitis: Secondary | ICD-10-CM

## 2019-11-02 DIAGNOSIS — I10 Essential (primary) hypertension: Secondary | ICD-10-CM | POA: Diagnosis not present

## 2019-11-02 DIAGNOSIS — M720 Palmar fascial fibromatosis [Dupuytren]: Secondary | ICD-10-CM | POA: Diagnosis not present

## 2019-11-02 DIAGNOSIS — M2550 Pain in unspecified joint: Secondary | ICD-10-CM

## 2019-11-02 DIAGNOSIS — B20 Human immunodeficiency virus [HIV] disease: Secondary | ICD-10-CM

## 2019-11-02 DIAGNOSIS — F331 Major depressive disorder, recurrent, moderate: Secondary | ICD-10-CM

## 2019-11-02 DIAGNOSIS — E039 Hypothyroidism, unspecified: Secondary | ICD-10-CM

## 2019-11-02 DIAGNOSIS — E785 Hyperlipidemia, unspecified: Secondary | ICD-10-CM

## 2019-11-02 NOTE — Patient Instructions (Signed)
To follow up 4 months with labs prior to visit

## 2019-11-02 NOTE — Progress Notes (Signed)
Careteam: Patient Care Team: Lauree Chandler, NP as PCP - General (Geriatric Medicine) Campbell Riches, MD as PCP - Infectious Diseases (Infectious Diseases) Jackolyn Confer, MD as Consulting Physician (General Surgery)  PLACE OF SERVICE:  [facility] Directive information Does Patient Have a Medical Advance Directive?: No, Would patient like information on creating a medical advance directive?: Yes (MAU/Ambulatory/Procedural Areas - Information given) (Given at previous visit)  Allergies  Allergen Reactions  . Crestor [Rosuvastatin Calcium]     myalgias  . Pravastatin     myalgias  . Sulfonamide Derivatives Swelling    SWELLING REACTION UNSPECIFIED     Chief Complaint  Patient presents with  . Medical Management of Chronic Issues    4 month follow-up. Will get flu vaccine at a later date. Patient will call GYN to get an appointment for pap smear     HPI: Patient is a 64 y.o. female for routine follow up.   Had covid booster yesterday so her arm is sore, will go to [facility] next week for flu shot.   Hypothyroid- reports her synthroid dose was decrease at last OV and feels better.   Did debrox and had wax removed.   Total hysterectomy in 2013, last PAP 2018?  Has a lot of pain in her hands. Had release to left hand but   Review of Systems:  Review of Systems  Constitutional: Negative for chills, fever and weight loss.  HENT: Negative for tinnitus.   Respiratory: Negative for cough, sputum production and shortness of breath.   Cardiovascular: Negative for chest pain, palpitations and leg swelling.  Gastrointestinal: Negative for abdominal pain, constipation, diarrhea and heartburn.  Genitourinary: Negative for dysuria, frequency and urgency.  Musculoskeletal: Positive for joint pain and myalgias. Negative for back pain and falls.  Skin: Negative.   Neurological: Negative for dizziness and headaches.  Psychiatric/Behavioral: Negative for depression and  memory loss. The patient does not have insomnia.    Past Medical History:  Diagnosis Date  . Anemia   . Anxiety    hx of-not on any meds  . Arthritis    "shoulders, arms, fingers, neck, back" (01/31/2016)  . Chronic cervical pain   . Family history of breast cancer   . History of bronchitis   . History of colon polyps   . History of kidney stones   . HIV infection ([place]) dx'd 2000   takes odefsey  . Hyperlipidemia    takes Pravastatin daily  . Hypertension    takes Benicar HCT daily  . Hyperthyroidism   . Hypothyroidism   . Internal hemorrhoids   . Joint pain   . Migraine    "in the past" (01/31/2016)  . Parathyroid adenoma   . Pituitary adenoma ([place])   . Refusal of blood transfusions as patient is Jehovah's Witness    "but substitutes are fine". (01/31/2016)  . Thyroid cancer ([place]) 12/2015  . Upper back pain    Past Surgical History:  Procedure Laterality Date  . ABDOMINAL HYSTERECTOMY    . BREAST EXCISIONAL BIOPSY Left    no scar seen   . BUNIONECTOMY Right   . COLONOSCOPY    . CYSTOSCOPY    . HAMMER TOE SURGERY Left    "middle toe was broke; turned into hammertoe then repaired"  . HAMMER TOE SURGERY Bilateral    "both small toes"  . PARATHYROIDECTOMY Right 01/31/2016  . PARATHYROIDECTOMY Right 01/31/2016   Procedure: PARATHYROIDECTOMY ON RIGHT SIDE;  Surgeon: Izora Gala,  MD;  Location: [facility];  Service: ENT;  Laterality: Right;  . SALPINGOOPHORECTOMY Bilateral   . THYROIDECTOMY N/A 01/31/2016   Procedure: TOTAL THYROIDECTOMY;  Surgeon: Izora Gala, MD;  Location: [facility];  Service: ENT;  Laterality: N/A;  . TOE SURGERY     bunion  . TONSILLECTOMY     as a child  . TOTAL THYROIDECTOMY  01/31/2016  . TUBAL LIGATION     30 yrs ago   Social History:   reports that she has never smoked. She has never used smokeless tobacco. She reports previous alcohol use. She reports that she does not use drugs.  Family History  Problem Relation Age of Onset  . Breast cancer  Mother 52  . Hypertension Mother   . Cancer Mother   . Hyperlipidemia Mother   . Stroke Mother   . Other Father        'vocal dysautonomia'  . Breast cancer Sister 5  . Thyroid disease Sister   . Osteoarthritis Maternal Grandmother   . Colon cancer Neg Hx     Medications: Patient's Medications  New Prescriptions   No medications on file  Previous Medications   DULOXETINE (CYMBALTA) 30 MG CAPSULE    TAKE 1 CAPSULE BY MOUTH EVERY DAY   HYDROCORTISONE (PROCTO-PAK) 1 % CREA    Twice daily as needed for hemorrhoids   KRILL OIL 500 MG CAPS    Take by mouth daily.   LEVOTHYROXINE (SYNTHROID) 75 MCG TABLET    TAKE 1 TABLET (75 MCG TOTAL) BY MOUTH DAILY BEFORE BREAKFAST.   LORATADINE (CLARITIN) 10 MG TABLET    Take 1 tablet (10 mg total) by mouth daily.   MULTIPLE VITAMINS-MINERALS (MULTIVITAMIN ADULTS 50+ PO)    Take 1 tablet by mouth daily.   ODEFSEY 200-25-25 MG TABS TABLET    TAKE 1 TABLET BY MOUTH EVERY DAY WITH BREAKFAST   OLMESARTAN-HYDROCHLOROTHIAZIDE (BENICAR HCT) 20-12.5 MG TABLET    Take 1 tablet by mouth daily.   OLOPATADINE (PATANOL) 0.1 % OPHTHALMIC SOLUTION    Place 1 drop into both eyes daily as needed for allergies.   PRAVASTATIN (PRAVACHOL) 40 MG TABLET    TAKE 1 TABLET BY MOUTH EVERY DAY   TRAMADOL (ULTRAM) 50 MG TABLET    Take 1 tablet (50 mg total) by mouth every 8 (eight) hours as needed.  Modified Medications   No medications on file  Discontinued Medications   No medications on file    Physical Exam:  Vitals:   11/02/19 1416  BP: 122/70  Pulse: 64  Temp: (!) 97.1 F (36.2 C)  TempSrc: Temporal  SpO2: 95%  Weight: 130 lb (59 kg)  Height: 5\' 5"  (1.651 m)   Body mass index is 21.63 kg/m. Wt Readings from Last 3 Encounters:  11/02/19 130 lb (59 kg)  08/09/19 129 lb (58.5 kg)  07/01/19 126 lb 9.6 oz (57.4 kg)    Physical Exam Constitutional:      General: She is not in acute distress.    Appearance: She is well-developed. She is not diaphoretic.   HENT:     Head: Normocephalic and atraumatic.     Mouth/Throat:     Pharynx: No oropharyngeal exudate.  Eyes:     Conjunctiva/sclera: Conjunctivae normal.     Pupils: Pupils are equal, round, and reactive to light.  Cardiovascular:     Rate and Rhythm: Normal rate and regular rhythm.     Heart sounds: Normal heart sounds.  Pulmonary:  Effort: Pulmonary effort is normal.     Breath sounds: Normal breath sounds.  Abdominal:     General: Bowel sounds are normal.     Palpations: Abdomen is soft.  Musculoskeletal:        General: Tenderness and deformity (bilateral hands) present.     Cervical back: Normal range of motion and neck supple.  Skin:    General: Skin is warm and dry.  Neurological:     Mental Status: She is alert and oriented to person, place, and time.     Labs reviewed: Basic Metabolic Panel: Recent Labs    12/21/18 1550 02/28/19 1226 07/01/19 1035 07/25/19 1458  NA  --  141  --  142  K  --  4.1  --  4.1  CL  --  104  --  103  CO2  --  28  --  31  GLUCOSE  --  86  --  102*  BUN  --  13  --  12  CREATININE  --  0.88  --  0.98  CALCIUM  --  9.5  --  9.6  TSH 0.09* 0.17* 0.07*  --    Liver Function Tests: Recent Labs    02/28/19 1226 07/25/19 1458  AST 23 24  ALT 17 21  BILITOT 0.5 0.3  PROT 6.6 6.6   No results for input(s): LIPASE, AMYLASE in the last 8760 hours. No results for input(s): AMMONIA in the last 8760 hours. CBC: Recent Labs    02/28/19 1226 07/25/19 1458  WBC 3.1* 3.9  NEUTROABS 1,076*  --   HGB 12.3 11.5*  HCT 37.2 35.5  MCV 93.5 94.2  PLT 203 176   Lipid Panel: Recent Labs    02/28/19 1226 07/25/19 1458  CHOL 230* 219*  HDL 68 67  LDLCALC 143* 129*  TRIG 85 123  CHOLHDL 3.4 3.3   TSH: Recent Labs    12/21/18 1550 02/28/19 1226 07/01/19 1035  TSH 0.09* 0.17* 0.07*   A1C: Lab Results  Component Value Date   HGBA1C 5.6 11/19/2015     Assessment/Plan 1. Seasonal allergies -improved with flonase and  claritin.  2. Dupuytren's contracture of both hands Ongoing, following with hand specialist.   3. Arthralgia, unspecified joint Stable, continues on cymbalta and tramadol PRN.  4. Essential hypertension, benign -controlled on benicar-hctz - COMPLETE METABOLIC PANEL WITH GFR; Future - CBC with Differential/Platelet; Future  5. Hypothyroidism, unspecified type Synthroid was reduced to 75 mcg and feeling better with dose adjustment.  6. Hyperlipidemia, unspecified hyperlipidemia type -dietary modifications encouraged, to continue pravastatin 40 mg daily  - Lipid Panel; Future - COMPLETE METABOLIC PANEL WITH GFR; Future  7. Moderate episode of recurrent major depressive disorder (HCC) Stable on cymbalta.  8. Human immunodeficiency virus (HIV) disease ([place]) Continues to follow up with ID and continues on odefsey  Next appt: 4 months, labs prior  [place] K. Pollard, [facility] [phone]

## 2019-11-21 ENCOUNTER — Other Ambulatory Visit: Payer: Self-pay | Admitting: Infectious Diseases

## 2019-11-21 DIAGNOSIS — B20 Human immunodeficiency virus [HIV] disease: Secondary | ICD-10-CM

## 2019-11-27 ENCOUNTER — Other Ambulatory Visit: Payer: Self-pay | Admitting: Nurse Practitioner

## 2019-11-27 DIAGNOSIS — J302 Other seasonal allergic rhinitis: Secondary | ICD-10-CM

## 2019-11-27 DIAGNOSIS — M2550 Pain in unspecified joint: Secondary | ICD-10-CM

## 2019-11-27 DIAGNOSIS — M720 Palmar fascial fibromatosis [Dupuytren]: Secondary | ICD-10-CM

## 2019-11-28 NOTE — Telephone Encounter (Signed)
Tramadol was last filled on 10/31/2019, treatment agreement on file from 12/24/2018  Will also have Lauree Chandler, NP review and confirm if ok to fill eye drop for allergies   Please advise

## 2019-12-16 ENCOUNTER — Encounter: Payer: Self-pay | Admitting: Endocrinology

## 2019-12-16 ENCOUNTER — Ambulatory Visit (INDEPENDENT_AMBULATORY_CARE_PROVIDER_SITE_OTHER): Payer: Medicare Other | Admitting: Endocrinology

## 2019-12-16 ENCOUNTER — Other Ambulatory Visit: Payer: Self-pay

## 2019-12-16 VITALS — BP 122/60 | HR 61 | Wt 131.6 lb

## 2019-12-16 DIAGNOSIS — C73 Malignant neoplasm of thyroid gland: Secondary | ICD-10-CM | POA: Diagnosis not present

## 2019-12-16 DIAGNOSIS — E213 Hyperparathyroidism, unspecified: Secondary | ICD-10-CM | POA: Diagnosis not present

## 2019-12-16 DIAGNOSIS — E890 Postprocedural hypothyroidism: Secondary | ICD-10-CM | POA: Diagnosis not present

## 2019-12-16 LAB — T4, FREE: Free T4: 0.72 ng/dL (ref 0.60–1.60)

## 2019-12-16 LAB — TSH: TSH: 2.87 u[IU]/mL (ref 0.35–4.50)

## 2019-12-16 LAB — VITAMIN D 25 HYDROXY (VIT D DEFICIENCY, FRACTURES): VITD: 39.6 ng/mL (ref 30.00–100.00)

## 2019-12-16 NOTE — Progress Notes (Signed)
Subjective:    Patient ID: Laurie Gray, female    DOB: 1955/06/02, 64 y.o.   MRN: [id number]  HPI Pt returns for f/u stage 1 of differentiated thyroid cancer, with this chronology: 1/18: thyroidectomy: PAPILLARY CARCINOMA, 0.9 CM. - FOCAL MICROSCOPIC EXTRATHYROIDAL EXTENSION PRESENT; INVOLVES RESECTION MARGIN.   3/18: RAI rx with thyrogen, 109 mCi 3/18: post-therapy scan: residual uptake in the thyroid bed. No evidence for distant metastatic disease.   1/19: TG=0.1 (Ab neg) 2/20: TG=0.1 (Ab neg) 10/20: TG=0.1 (Ab neg) 6/21  TG=0.1 (Ab neg) Pt also has pituitary cyst (dx'ed 2005, when MRI showed 3.5 x 6.5 mm cyst; f/u in 2020 was unchanged).   She takes synthroid as rx'ed.  Main symptom is fatigue.    Pt also has hypercalcemia (she had parathyroidect in 2018).  She takes multivitamin, but not a dedicated Vit-D supplement.   Past Medical History:  Diagnosis Date  . Anemia   . Anxiety    hx of-not on any meds  . Arthritis    "shoulders, arms, fingers, neck, back" (01/31/2016)  . Chronic cervical pain   . Family history of breast cancer   . History of bronchitis   . History of colon polyps   . History of kidney stones   . HIV infection ([place]) dx'd 2000   takes odefsey  . Hyperlipidemia    takes Pravastatin daily  . Hypertension    takes Benicar HCT daily  . Hyperthyroidism   . Hypothyroidism   . Internal hemorrhoids   . Joint pain   . Migraine    "in the past" (01/31/2016)  . Parathyroid adenoma   . Pituitary adenoma ([place])   . Refusal of blood transfusions as patient is Jehovah's Witness    "but substitutes are fine". (01/31/2016)  . Thyroid cancer ([place]) 12/2015  . Upper back pain     Past Surgical History:  Procedure Laterality Date  . ABDOMINAL HYSTERECTOMY    . BREAST EXCISIONAL BIOPSY Left    no scar seen   . BUNIONECTOMY Right   . COLONOSCOPY    . CYSTOSCOPY    . HAMMER TOE SURGERY Left    "middle toe was broke; turned into hammertoe then repaired"   . HAMMER TOE SURGERY Bilateral    "both small toes"  . PARATHYROIDECTOMY Right 01/31/2016  . PARATHYROIDECTOMY Right 01/31/2016   Procedure: PARATHYROIDECTOMY ON RIGHT SIDE;  Surgeon: Izora Gala, MD;  Location: [facility];  Service: ENT;  Laterality: Right;  . SALPINGOOPHORECTOMY Bilateral   . THYROIDECTOMY N/A 01/31/2016   Procedure: TOTAL THYROIDECTOMY;  Surgeon: Izora Gala, MD;  Location: [facility];  Service: ENT;  Laterality: N/A;  . TOE SURGERY     bunion  . TONSILLECTOMY     as a child  . TOTAL THYROIDECTOMY  01/31/2016  . TUBAL LIGATION     30 yrs ago    Social History   Socioeconomic History  . Marital status: Widowed    Spouse name: Not on file  . Number of children: Not on file  . Years of education: Not on file  . Highest education level: Not on file  Occupational History  . Not on file  Tobacco Use  . Smoking status: Never Smoker  . Smokeless tobacco: Never Used  Vaping Use  . Vaping Use: Never used  Substance and Sexual Activity  . Alcohol use: Not Currently    Alcohol/week: 0.0 standard drinks    Comment: 01/31/2016 "might go out to eat and have a  drink once/month; no more than that"  . Drug use: No  . Sexual activity: Not Currently    Partners: Male    Comment: declined condoms  Other Topics Concern  . Not on file  Social History Narrative   Normal diet   Drinks Caffeine- coffee/coke/chocolate    Lives in an apartment/ third level/1 person/1 pet   Past profession- Museum/gallery exhibitions officer, cosmetologist   Exercises 3-4 x weekly (cardio and [employer])       Social Determinants of Health   Financial Resource Strain: Not on file  Food Insecurity: Not on file  Transportation Needs: Not on file  Physical Activity: Not on file  Stress: Not on file  Social Connections: Not on file  Intimate Partner Violence: Not on file    Current Outpatient Medications on File Prior to Visit  Medication Sig Dispense Refill  . DULoxetine (CYMBALTA) 30 MG capsule TAKE 1 CAPSULE BY  MOUTH EVERY DAY 90 capsule 1  . hydrocortisone (PROCTO-PAK) 1 % CREA Twice daily as needed for hemorrhoids 28.35 g 1  . Krill Oil 500 MG CAPS Take by mouth daily.    Marland Kitchen levothyroxine (SYNTHROID) 75 MCG tablet TAKE 1 TABLET (75 MCG TOTAL) BY MOUTH DAILY BEFORE BREAKFAST. 90 tablet 1  . loratadine (CLARITIN) 10 MG tablet Take 1 tablet (10 mg total) by mouth daily. 30 tablet 11  . Multiple Vitamins-Minerals (MULTIVITAMIN ADULTS 50+ PO) Take 1 tablet by mouth daily.    . ODEFSEY 200-25-25 MG TABS tablet TAKE 1 TABLET BY MOUTH EVERY DAY WITH BREAKFAST 30 tablet 5  . olmesartan-hydrochlorothiazide (BENICAR HCT) 20-12.5 MG tablet Take 1 tablet by mouth daily. 90 tablet 1  . olopatadine (PATANOL) 0.1 % ophthalmic solution PLACE 1 DROP INTO BOTH EYES DAILY AS NEEDED FOR ALLERGIES. 15 mL 5  . pravastatin (PRAVACHOL) 40 MG tablet TAKE 1 TABLET BY MOUTH EVERY DAY 90 tablet 1  . traMADol (ULTRAM) 50 MG tablet TAKE 1 TABLET BY MOUTH EVERY 8 HOURS AS NEEDED. 90 tablet 0   No current facility-administered medications on file prior to visit.    Allergies  Allergen Reactions  . Crestor [Rosuvastatin Calcium]     myalgias  . Pravastatin     myalgias  . Sulfonamide Derivatives Swelling    SWELLING REACTION UNSPECIFIED     Family History  Problem Relation Age of Onset  . Breast cancer Mother 89  . Hypertension Mother   . Cancer Mother   . Hyperlipidemia Mother   . Stroke Mother   . Other Father        'vocal dysautonomia'  . Breast cancer Sister 8  . Thyroid disease Sister   . Osteoarthritis Maternal Grandmother   . Colon cancer Neg Hx     BP 122/60 (BP Location: Left Arm, Patient Position: Sitting, Cuff Size: Normal)   Pulse 61   Wt 131 lb 9.6 oz (59.7 kg)   SpO2 98%   BMI 21.90 kg/m    Review of Systems Denies neck swelling or pain    Objective:   Physical Exam VITAL SIGNS:  See vs page GENERAL: no distress Neck: a healed scar is present.  I do not appreciate a nodule in the  thyroid or elsewhere in the neck.    Lab Results  Component Value Date   TSH 2.87 12/16/2019       Assessment & Plan:  Hypothyroidism: well-replaced. Please continue the same synthroid PTC: due for recheck Hypercalcemia: check labs today.    Patient Instructions  Blood  tests are requested for you today.  We'll let you know about the results.   Please come back for a follow-up appointment in 6 months.

## 2019-12-16 NOTE — Patient Instructions (Addendum)
Blood tests are requested for you today.  We'll let you know about the results.  Please come back for a follow-up appointment in 6 months.   

## 2019-12-23 LAB — THYROGLOBULIN LEVEL: Thyroglobulin: 0.1 ng/mL — ABNORMAL LOW

## 2019-12-23 LAB — VITAMIN A: Vitamin A (Retinoic Acid): 51 ug/dL (ref 38–98)

## 2019-12-23 LAB — VITAMIN D 1,25 DIHYDROXY
Vitamin D 1, 25 (OH)2 Total: 46 pg/mL (ref 18–72)
Vitamin D2 1, 25 (OH)2: <8 pg/mL
Vitamin D3 1, 25 (OH)2: 46 pg/mL

## 2019-12-23 LAB — PTH, INTACT AND CALCIUM
Calcium: 9.8 mg/dL (ref 8.6–10.4)
PTH: 64 pg/mL (ref 14–64)

## 2019-12-23 LAB — THYROGLOBULIN ANTIBODY: Thyroglobulin Ab: <1 IU/mL (ref <=1)

## 2019-12-23 LAB — PTH-RELATED PEPTIDE: PTH-Related Protein (PTH-RP): 14 pg/mL (ref 11–20)

## 2019-12-26 ENCOUNTER — Other Ambulatory Visit: Payer: Self-pay | Admitting: Nurse Practitioner

## 2019-12-26 DIAGNOSIS — M2550 Pain in unspecified joint: Secondary | ICD-10-CM

## 2019-12-26 DIAGNOSIS — M720 Palmar fascial fibromatosis [Dupuytren]: Secondary | ICD-10-CM

## 2019-12-27 ENCOUNTER — Other Ambulatory Visit: Payer: Self-pay | Admitting: *Deleted

## 2019-12-27 DIAGNOSIS — M2550 Pain in unspecified joint: Secondary | ICD-10-CM

## 2019-12-27 DIAGNOSIS — M720 Palmar fascial fibromatosis [Dupuytren]: Secondary | ICD-10-CM

## 2019-12-27 MED ORDER — TRAMADOL HCL 50 MG PO TABS: 50.0000 mg | ORAL_TABLET | Freq: Three times a day (TID) | ORAL | 0 refills | Status: DC | PRN

## 2019-12-27 NOTE — Telephone Encounter (Signed)
Patient requested refill Epic LR: 11/28/2019 Contract needs updated. Note added to next appointment.  Pended Rx and sent to [facility] for approval.

## 2020-01-04 ENCOUNTER — Ambulatory Visit: Payer: Medicare Other | Admitting: Endocrinology

## 2020-01-26 ENCOUNTER — Other Ambulatory Visit: Payer: Self-pay | Admitting: Nurse Practitioner

## 2020-01-26 DIAGNOSIS — M720 Palmar fascial fibromatosis [Dupuytren]: Secondary | ICD-10-CM

## 2020-01-26 DIAGNOSIS — M2550 Pain in unspecified joint: Secondary | ICD-10-CM

## 2020-01-26 NOTE — Telephone Encounter (Signed)
Medication last filled in Epic on 12/27/2019  Treatment agreement on file from 12/24/2018  Notation already made on pending appointment 03/09/2020 to update treatment agreement

## 2020-02-06 ENCOUNTER — Ambulatory Visit (INDEPENDENT_AMBULATORY_CARE_PROVIDER_SITE_OTHER): Payer: Medicare Other | Admitting: Nurse Practitioner

## 2020-02-06 ENCOUNTER — Other Ambulatory Visit: Payer: Self-pay

## 2020-02-06 ENCOUNTER — Encounter: Payer: Self-pay | Admitting: Nurse Practitioner

## 2020-02-06 ENCOUNTER — Telehealth: Payer: Self-pay

## 2020-02-06 DIAGNOSIS — Z0000 Encounter for general adult medical examination without abnormal findings: Secondary | ICD-10-CM

## 2020-02-06 NOTE — Patient Instructions (Signed)
Laurie Gray , Thank you for taking time to come for your Medicare Wellness Visit. I appreciate your ongoing commitment to your health goals. Please review the following plan we discussed and let me know if I can assist you in the future.   Screening recommendations/referrals: Colonoscopy up to date Mammogram Due MARCH 2022 Bone Density DUE October 2022 Recommended yearly ophthalmology/optometry visit for glaucoma screening and checkup Recommended yearly dental visit for hygiene and checkup  Vaccinations: Influenza vaccine DUE- to get at your local pharmacy  Pneumococcal vaccine- up to date Tdap vaccine RECOMMENDED- to get local pharmacy Shingles vaccine  COVID vaccine up to date  Advanced directives: recommended to complete and put on file.   Conditions/risks identified: cardiovascular risk factors.   Next appointment: yearly for AWV  Preventive Care 40-64 Years, Female Preventive care refers to lifestyle choices and visits with your health care provider that can promote health and wellness. What does preventive care include?  A yearly physical exam. This is also called an annual well check.  Dental exams once or twice a year.  Routine eye exams. Ask your health care provider how often you should have your eyes checked.  Personal lifestyle choices, including:  Daily care of your teeth and gums.  Regular physical activity.  Eating a healthy diet.  Avoiding tobacco and drug use.  Limiting alcohol use.  Practicing safe sex.  Taking low-dose aspirin daily starting at age 67.  Taking vitamin and mineral supplements as recommended by your health care provider. What happens during an annual well check? The services and screenings done by your health care provider during your annual well check will depend on your age, overall health, lifestyle risk factors, and family history of disease. Counseling  Your health care provider may ask you questions about your:  Alcohol  use.  Tobacco use.  Drug use.  Emotional well-being.  Home and relationship well-being.  Sexual activity.  Eating habits.  Work and work Statistician.  Method of birth control.  Menstrual cycle.  Pregnancy history. Screening  You may have the following tests or measurements:  Height, weight, and BMI.  Blood pressure.  Lipid and cholesterol levels. These may be checked every 5 years, or more frequently if you are over 21 years old.  Skin check.  Lung cancer screening. You may have this screening every year starting at age 55 if you have a 30-pack-year history of smoking and currently smoke or have quit within the past 15 years.  Fecal occult blood test (FOBT) of the stool. You may have this test every year starting at age 56.  Flexible sigmoidoscopy or colonoscopy. You may have a sigmoidoscopy every 5 years or a colonoscopy every 10 years starting at age 48.  Hepatitis C blood test.  Hepatitis B blood test.  Sexually transmitted disease (STD) testing.  Diabetes screening. This is done by checking your blood sugar (glucose) after you have not eaten for a while (fasting). You may have this done every 1-3 years.  Mammogram. This may be done every 1-2 years. Talk to your health care provider about when you should start having regular mammograms. This may depend on whether you have a family history of breast cancer.  BRCA-related cancer screening. This may be done if you have a family history of breast, ovarian, tubal, or peritoneal cancers.  Pelvic exam and Pap test. This may be done every 3 years starting at age 40. Starting at age 6, this may be done every 5 years if you  have a Pap test in combination with an HPV test.  Bone density scan. This is done to screen for osteoporosis. You may have this scan if you are at high risk for osteoporosis. Discuss your test results, treatment options, and if necessary, the need for more tests with your health care  provider. Vaccines  Your health care provider may recommend certain vaccines, such as:  Influenza vaccine. This is recommended every year.  Tetanus, diphtheria, and acellular pertussis (Tdap, Td) vaccine. You may need a Td booster every 10 years.  Zoster vaccine. You may need this after age 78.  Pneumococcal 13-valent conjugate (PCV13) vaccine. You may need this if you have certain conditions and were not previously vaccinated.  Pneumococcal polysaccharide (PPSV23) vaccine. You may need one or two doses if you smoke cigarettes or if you have certain conditions. Talk to your health care provider about which screenings and vaccines you need and how often you need them. This information is not intended to replace advice given to you by your health care provider. Make sure you discuss any questions you have with your health care provider. Document Released: 01/19/2015 Document Revised: 09/12/2015 Document Reviewed: 10/24/2014 Elsevier Interactive Patient Education  2017 [employer] Prevention in the Home Falls can cause injuries. They can happen to people of all ages. There are many things you can do to make your home safe and to help prevent falls. What can I do on the outside of my home?  Regularly fix the edges of walkways and driveways and fix any cracks.  Remove anything that might make you trip as you walk through a door, such as a raised step or threshold.  Trim any bushes or trees on the path to your home.  Use bright outdoor lighting.  Clear any walking paths of anything that might make someone trip, such as rocks or tools.  Regularly check to see if handrails are loose or broken. Make sure that both sides of any steps have handrails.  Any raised decks and porches should have guardrails on the edges.  Have any leaves, snow, or ice cleared regularly.  Use sand or salt on walking paths during winter.  Clean up any spills in your garage right away. This includes  oil or grease spills. What can I do in the bathroom?  Use night lights.  Install grab bars by the toilet and in the tub and shower. Do not use towel bars as grab bars.  Use non-skid mats or decals in the tub or shower.  If you need to sit down in the shower, use a plastic, non-slip stool.  Keep the floor dry. Clean up any water that spills on the floor as soon as it happens.  Remove soap buildup in the tub or shower regularly.  Attach bath mats securely with double-sided non-slip rug tape.  Do not have throw rugs and other things on the floor that can make you trip. What can I do in the bedroom?  Use night lights.  Make sure that you have a light by your bed that is easy to reach.  Do not use any sheets or blankets that are too big for your bed. They should not hang down onto the floor.  Have a firm chair that has side arms. You can use this for support while you get dressed.  Do not have throw rugs and other things on the floor that can make you trip. What can I do in the kitchen?  Clean up any spills right away.  Avoid walking on wet floors.  Keep items that you use a lot in easy-to-reach places.  If you need to reach something above you, use a strong step stool that has a grab bar.  Keep electrical cords out of the way.  Do not use floor polish or wax that makes floors slippery. If you must use wax, use non-skid floor wax.  Do not have throw rugs and other things on the floor that can make you trip. What can I do with my stairs?  Do not leave any items on the stairs.  Make sure that there are handrails on both sides of the stairs and use them. Fix handrails that are broken or loose. Make sure that handrails are as long as the stairways.  Check any carpeting to make sure that it is firmly attached to the stairs. Fix any carpet that is loose or worn.  Avoid having throw rugs at the top or bottom of the stairs. If you do have throw rugs, attach them to the floor  with carpet tape.  Make sure that you have a light switch at the top of the stairs and the bottom of the stairs. If you do not have them, ask someone to add them for you. What else can I do to help prevent falls?  Wear shoes that:  Do not have high heels.  Have rubber bottoms.  Are comfortable and fit you well.  Are closed at the toe. Do not wear sandals.  If you use a stepladder:  Make sure that it is fully opened. Do not climb a closed stepladder.  Make sure that both sides of the stepladder are locked into place.  Ask someone to hold it for you, if possible.  Clearly mark and make sure that you can see:  Any grab bars or handrails.  First and last steps.  Where the edge of each step is.  Use tools that help you move around (mobility aids) if they are needed. These include:  Canes.  Walkers.  Scooters.  Crutches.  Turn on the lights when you go into a dark area. Replace any light bulbs as soon as they burn out.  Set up your furniture so you have a clear path. Avoid moving your furniture around.  If any of your floors are uneven, fix them.  If there are any pets around you, be aware of where they are.  Review your medicines with your doctor. Some medicines can make you feel dizzy. This can increase your chance of falling. Ask your doctor what other things that you can do to help prevent falls. This information is not intended to replace advice given to you by your health care provider. Make sure you discuss any questions you have with your health care provider. Document Released: 10/19/2008 Document Revised: 05/31/2015 Document Reviewed: 01/27/2014 Elsevier Interactive Patient Education  2017 [employer].

## 2020-02-06 NOTE — Progress Notes (Signed)
Subjective:   Laurie Gray is a 65 y.o. female who presents for Medicare Annual (Subsequent) preventive examination.  Review of Systems     Cardiac Risk Factors include: dyslipidemia;hypertension     Objective:    Today's Vitals   02/06/20 1538  PainSc: 6    There is no height or weight on file to calculate BMI.  Advanced Directives 02/06/2020 11/02/2019 06/29/2019 02/28/2019 02/22/2019 02/02/2019 08/19/2018  Does Patient Have a Medical Advance Directive? No No No No No No No  Would patient like information on creating a medical advance directive? Yes (MAU/Ambulatory/Procedural Areas - Information given) Yes (MAU/Ambulatory/Procedural Areas - Information given) No - Patient declined Yes (MAU/Ambulatory/Procedural Areas - Information given) - Yes (MAU/Ambulatory/Procedural Areas - Information given) No - Patient declined    Current Medications (verified) Outpatient Encounter Medications as of 02/06/2020  Medication Sig  . DULoxetine (CYMBALTA) 30 MG capsule TAKE 1 CAPSULE BY MOUTH EVERY DAY  . hydrocortisone (PROCTO-PAK) 1 % CREA Twice daily as needed for hemorrhoids  . levothyroxine (SYNTHROID) 75 MCG tablet TAKE 1 TABLET (75 MCG TOTAL) BY MOUTH DAILY BEFORE BREAKFAST.  Marland Kitchen loratadine (CLARITIN) 10 MG tablet Take 1 tablet (10 mg total) by mouth daily.  . Multiple Vitamins-Minerals (MULTIVITAMIN ADULTS 50+ PO) Take 1 tablet by mouth daily.  . ODEFSEY 200-25-25 MG TABS tablet TAKE 1 TABLET BY MOUTH EVERY DAY WITH BREAKFAST  . olmesartan-hydrochlorothiazide (BENICAR HCT) 20-12.5 MG tablet Take 1 tablet by mouth daily.  Marland Kitchen olopatadine (PATANOL) 0.1 % ophthalmic solution PLACE 1 DROP INTO BOTH EYES DAILY AS NEEDED FOR ALLERGIES.  Marland Kitchen pravastatin (PRAVACHOL) 40 MG tablet TAKE 1 TABLET BY MOUTH EVERY DAY  . traMADol (ULTRAM) 50 MG tablet TAKE 1 TABLET BY MOUTH EVERY 8 HOURS AS NEEDED  . [DISCONTINUED] Krill Oil 500 MG CAPS Take by mouth daily.   No facility-administered encounter  medications on file as of 02/06/2020.    Allergies (verified) Crestor [rosuvastatin calcium], Pravastatin, and Sulfonamide derivatives   History: Past Medical History:  Diagnosis Date  . Anemia   . Anxiety    hx of-not on any meds  . Arthritis    "shoulders, arms, fingers, neck, back" (01/31/2016)  . Chronic cervical pain   . Family history of breast cancer   . History of bronchitis   . History of colon polyps   . History of kidney stones   . HIV infection ([place]) dx'd 2000   takes odefsey  . Hyperlipidemia    takes Pravastatin daily  . Hypertension    takes Benicar HCT daily  . Hyperthyroidism   . Hypothyroidism   . Internal hemorrhoids   . Joint pain   . Migraine    "in the past" (01/31/2016)  . Parathyroid adenoma   . Pituitary adenoma ([place])   . Refusal of blood transfusions as patient is Jehovah's Witness    "but substitutes are fine". (01/31/2016)  . Thyroid cancer ([place]) 12/2015  . Upper back pain    Past Surgical History:  Procedure Laterality Date  . ABDOMINAL HYSTERECTOMY    . BREAST EXCISIONAL BIOPSY Left    no scar seen   . BUNIONECTOMY Right   . COLONOSCOPY    . CYSTOSCOPY    . HAMMER TOE SURGERY Left    "middle toe was broke; turned into hammertoe then repaired"  . HAMMER TOE SURGERY Bilateral    "both small toes"  . PARATHYROIDECTOMY Right 01/31/2016  . PARATHYROIDECTOMY Right 01/31/2016   Procedure: PARATHYROIDECTOMY ON RIGHT SIDE;  Surgeon: Serena Colonel, MD;  Location: [facility] OR;  Service: ENT;  Laterality: Right;  . SALPINGOOPHORECTOMY Bilateral   . THYROIDECTOMY N/A 01/31/2016   Procedure: TOTAL THYROIDECTOMY;  Surgeon: Serena Colonel, MD;  Location[facility] OR;  Service: ENT;  Laterality: N/A;  . TOE SURGERY     bunion  . TONSILLECTOMY     as a child  . TOTAL THYROIDECTOMY  01/31/2016  . TUBAL LIGATION     30 yrs ago   Family History  Problem Relation Age of Onset  . Breast cancer Mother 7  . Hypertension Mother   . Cancer Mother   . Hyperlipidemia  Mother   . Stroke Mother   . Other Father        'vocal dysautonomia'  . Breast cancer Sister 96  . Thyroid disease Sister   . Osteoarthritis Maternal Grandmother   . Colon cancer Neg Hx    Social History   Socioeconomic History  . Marital status: Widowed    Spouse name: Not on file  . Number of children: Not on file  . Years of education: Not on file  . Highest education level: Not on file  Occupational History  . Not on file  Tobacco Use  . Smoking status: Never Smoker  . Smokeless tobacco: Never Used  Vaping Use  . Vaping Use: Never used  Substance and Sexual Activity  . Alcohol use: Not Currently    Alcohol/week: 0.0 standard drinks    Comment: 01/31/2016 "might go out to eat and have a drink once/month; no more than that"  . Drug use: No  . Sexual activity: Not Currently    Partners: Male    Comment: declined condoms  Other Topics Concern  . Not on file  Social History Narrative   Normal diet   Drinks Caffeine- coffee/coke/chocolate    Lives in an apartment/ third level/1 person/1 pet   Past profession- Market researcher, cosmetologist   Exercises 3-4 x weekly (cardio and weights)       Social Determinants of Health   Financial Resource Strain: Not on file  Food Insecurity: Not on file  Transportation Needs: Not on file  Physical Activity: Not on file  Stress: Not on file  Social Connections: Not on file    Tobacco Counseling Counseling given: Not Answered   Clinical Intake:  Pre-visit preparation completed: Yes  Pain : 0-10 Pain Score: 6  Pain Type: Chronic pain Pain Location: Hand Pain Orientation: Right,Left Pain Descriptors / Indicators: Aching Pain Onset: More than a month ago Pain Frequency: Constant Pain Relieving Factors: tramadol Effect of Pain on Daily Activities: hard to do any activities due to pain, sleep  Pain Relieving Factors: tramadol  BMI - recorded: 21 Nutritional Status: BMI of 19-24  Normal Nutritional Risks:  None Diabetes: No  How often do you need to have someone help you when you read instructions, pamphlets, or other written materials from your doctor or pharmacy?: 1 - Never  Diabetic?no         Activities of Daily Living In your present state of health, do you have any difficulty performing the following activities: 02/06/2020  Hearing? N  Vision? Y  Difficulty concentrating or making decisions? Y  Comment if she misses tramadol and having a lot of pain can not concentrate  Walking or climbing stairs? N  Dressing or bathing? N  Doing errands, shopping? N  Preparing Food and eating ? N  Using the Toilet? N  In the past six months, have  you accidently leaked urine? Y  Do you have problems with loss of bowel control? N  Managing your Medications? N  Managing your Finances? N  Housekeeping or managing your Housekeeping? N  Some recent data might be hidden    Patient Care Team: Lauree Chandler, NP as PCP - General (Geriatric Medicine) Campbell Riches, MD as PCP - Infectious Diseases (Infectious Diseases) Jackolyn Confer, MD as Consulting Physician (General Surgery)  Indicate any recent Medical Services you may have received from other than [facility] providers in the past year (date may be approximate).     Assessment:   This is a routine wellness examination for Laurie Gray.  Hearing/Vision screen  Hearing Screening   125Hz  250Hz  500Hz  1000Hz  2000Hz  3000Hz  4000Hz  6000Hz  8000Hz   Right ear:           Left ear:           Comments: No hearing issues   Vision Screening Comments: Last eye exam greater than 12 months ago, patient requesting referral   Dietary issues and exercise activities discussed: Current Exercise Habits: Home exercise routine, Type of exercise: treadmill, Time (Minutes): 60, Frequency (Times/Week): 4, Weekly Exercise (Minutes/Week): 240  Goals    . Exercise 3x per week (30 min per time)     Pt would like to continue to exercise at least 5 times a week      . Increase physical activity     Starting 01/21/16, I will attempt to increase my water intake from 2 cups to 5 cups per day.       Depression Screen PHQ 2/9 Scores 02/06/2020 08/09/2019 06/29/2019 02/02/2019 01/28/2018 06/25/2017 01/21/2017  PHQ - 2 Score 0 1 0 0 0 0 1  PHQ- 9 Score - - - - - - -    Fall Risk Fall Risk  02/06/2020 11/02/2019 08/09/2019 06/29/2019 02/28/2019  Falls in the past year? 0 0 0 0 1  Number falls in past yr: 0 0 - 0 0  Injury with Fall? 0 0 - 0 0    FALL RISK PREVENTION PERTAINING TO THE HOME:  Any stairs in or around the home? Yes  If so, are there any without handrails? No  Home free of loose throw rugs in walkways, pet beds, electrical cords, etc? Yes  Adequate lighting in your home to reduce risk of falls? Yes   ASSISTIVE DEVICES UTILIZED TO PREVENT FALLS:  Life alert? No  Use of a cane, walker or w/c? No  Grab bars in the bathroom? Yes  Shower chair or bench in shower? Yes  Elevated toilet seat or a handicapped toilet? No   TIMED UP AND GO:  Was the test performed? No .    Cognitive Function: MMSE - Mini Mental State Exam 01/28/2018 01/21/2016  Orientation to time 5 5  Orientation to Place 5 5  Registration 3 3  Attention/ Calculation 5 5  Recall 3 3  Language- name 2 objects 2 2  Language- repeat 1 1  Language- follow 3 step command 3 3  Language- read & follow direction 1 1  Write a sentence 1 1  Copy design 1 1  Total score 30 30     6CIT Screen 02/06/2020 02/02/2019  What Year? 0 points 0 points  What month? 0 points 0 points  What time? 0 points 0 points  Count back from 20 0 points 0 points  Months in reverse 0 points 0 points  Repeat phrase 0 points 2 points  Total  Score 0 2    Immunizations Immunization History  Administered Date(s) Administered  . Hepatitis B, adult 06/09/2013, 07/11/2013, 12/05/2013  . Influenza Split 09/08/2010, 11/10/2011  . Influenza Whole 12/25/2008, 09/24/2009  . Influenza,inj,Quad PF,6+ Mos  10/11/2012, 11/02/2013, 11/08/2014, 10/22/2015, 09/24/2016, 10/13/2018  . Influenza-Unspecified 10/15/2017  . PFIZER(Purple Top)SARS-COV-2 Vaccination 03/12/2019, 04/15/2019, 10/30/2019  . Pneumococcal Conjugate-13 03/28/2014  . Pneumococcal Polysaccharide-23 01/24/2010  . Zoster Recombinat (Shingrix) 01/28/2018, 03/10/2018    TDAP status: Due, Education has been provided regarding the importance of this vaccine. Advised may receive this vaccine at local pharmacy or Health Dept. Aware to provide a copy of the vaccination record if obtained from local pharmacy or Health Dept. Verbalized acceptance and understanding.  Flu Vaccine status: Due, Education has been provided regarding the importance of this vaccine. Advised may receive this vaccine at local pharmacy or Health Dept. Aware to provide a copy of the vaccination record if obtained from local pharmacy or Health Dept. Verbalized acceptance and understanding.  Pneumococcal vaccine status: Up to date  Covid-19 vaccine status: Completed vaccines  Qualifies for Shingles Vaccine? Yes   Zostavax completed No   Shingrix Completed?: Yes  Screening Tests Health Maintenance  Topic Date Due  . TETANUS/TDAP  Never done  . PAP SMEAR-Modifier  06/06/2019  . INFLUENZA VACCINE  08/07/2019  . COVID-19 Vaccine (4 - Booster for Pfizer series) 04/29/2020  . DEXA SCAN  10/28/2020  . MAMMOGRAM  03/08/2021  . COLONOSCOPY (Pts 45-42yrs Insurance coverage will need to be confirmed)  01/09/2025  . Hepatitis C Screening  Completed  . HIV Screening  Completed    Health Maintenance  Health Maintenance Due  Topic Date Due  . TETANUS/TDAP  Never done  . PAP SMEAR-Modifier  06/06/2019  . INFLUENZA VACCINE  08/07/2019    Colorectal cancer screening: No longer required.   Mammogram status: Completed march 2021. Repeat every year  Bone Density status: Completed 10/2019. Results reflect: Bone density results: OSTEOPENIA. Repeat every 2 years.  Lung  Cancer Screening: (Low Dose CT Chest recommended if Age 73-80 years, 30 pack-year currently smoking OR have quit w/in 15years.) does not qualify.   Additional Screening:  Hepatitis C Screening: does qualify; Completed 2015   Vision Screening: Recommended annual ophthalmology exams for early detection of glaucoma and other disorders of the eye. Is the patient up to date with their annual eye exam?  No  Who is the provider or what is the name of the office in which the patient attends annual eye exams? Needs referral  If pt is not established with a provider, would they like to be referred to a provider to establish care? Yes .   Dental Screening: Recommended annual dental exams for proper oral hygiene  Community Resource Referral / Chronic Care Management: CRR required this visit?  No   CCM required this visit?  No      Plan:     I have personally reviewed and noted the following in the patient's chart:   . Medical and social history . Use of alcohol, tobacco or illicit drugs  . Current medications and supplements . Functional ability and status . Nutritional status . Physical activity . Advanced directives . List of other physicians . Hospitalizations, surgeries, and ER visits in previous 12 months . Vitals . Screenings to include cognitive, depression, and falls . Referrals and appointments  In addition, I have reviewed and discussed with patient certain preventive protocols, quality metrics, and best practice recommendations. A written personalized care plan  for preventive services as well as general preventive health recommendations were provided to patient.     Sharon Seller, NP   02/06/2020    Virtual Visit via Telephone Note  I connected with@ on 02/06/20 at  3:15 PM EST by telephone and verified that I am speaking with the correct person using two identifiers.  Location: Patient: home Provider: [facility]   I discussed the limitations, risks, security and  privacy concerns of performing an evaluation and management service by telephone and the availability of in person appointments. I also discussed with the patient that there may be a patient responsible charge related to this service. The patient expressed understanding and agreed to proceed.   I discussed the assessment and treatment plan with the patient. The patient was provided an opportunity to ask questions and all were answered. The patient agreed with the plan and demonstrated an understanding of the instructions.   The patient was advised to call back or seek an in-person evaluation if the symptoms worsen or if the condition fails to improve as anticipated.  I provided 18 minutes of non-face-to-face time during this encounter.  Janene Harvey. [employer]ag[employer] printed and mailed

## 2020-02-06 NOTE — Telephone Encounter (Signed)
Ms. brenlynn, fake are scheduled for a virtual visit with your provider today.    Just as we do with appointments in the office, we must obtain your consent to participate.  Your consent will be active for this visit and any virtual visit you may have with one of our providers in the next 365 days.    If you have a MyChart account, I can also send a copy of this consent to you electronically.  All virtual visits are billed to your insurance company just like a traditional visit in the office.  As this is a virtual visit, video technology does not allow for your provider to perform a traditional examination.  This may limit your provider's ability to fully assess your condition.  If your provider identifies any concerns that need to be evaluated in person or the need to arrange testing such as labs, EKG, etc, we will make arrangements to do so.    Although advances in technology are sophisticated, we cannot ensure that it will always work on either your end or our end.  If the connection with a video visit is poor, we may have to switch to a telephone visit.  With either a video or telephone visit, we are not always able to ensure that we have a secure connection.   I need to obtain your verbal consent now.   Are you willing to proceed with your visit today?   Reighn Viera Okonski has provided verbal consent on 02/06/2020 for a virtual visit (video or telephone).   Leigh Aurora [place], [place] 02/06/2020  3:16 PM

## 2020-02-06 NOTE — Progress Notes (Signed)
   This service is provided via telemedicine  No vital signs collected/recorded due to the encounter was a telemedicine visit.   Location of patient (ex: home, work):  Home  Patient consents to a telephone visit: Yes, see telephone visit dated 02/06/20  Location of the provider (ex: office, home):  [facility], Office   Name of any referring provider:  N/A  Names of all persons participating in the telemedicine service and their role in the encounter:  S.Chrae B/CMA, [name], NP, and Patient   Time spent on call:  9 min with medical assistant

## 2020-02-21 DIAGNOSIS — H35352 Cystoid macular degeneration, left eye: Secondary | ICD-10-CM | POA: Diagnosis not present

## 2020-02-23 ENCOUNTER — Other Ambulatory Visit: Payer: Self-pay | Admitting: Nurse Practitioner

## 2020-02-23 DIAGNOSIS — M2550 Pain in unspecified joint: Secondary | ICD-10-CM

## 2020-02-23 DIAGNOSIS — M720 Palmar fascial fibromatosis [Dupuytren]: Secondary | ICD-10-CM

## 2020-02-28 ENCOUNTER — Other Ambulatory Visit: Payer: Self-pay | Admitting: Nurse Practitioner

## 2020-02-28 DIAGNOSIS — M2550 Pain in unspecified joint: Secondary | ICD-10-CM

## 2020-02-28 DIAGNOSIS — M720 Palmar fascial fibromatosis [Dupuytren]: Secondary | ICD-10-CM

## 2020-02-28 NOTE — Telephone Encounter (Signed)
Patient requested refill Epic LR: 01/26/2020 Contract need updated, note added to upcoming appointment Pended Rx and sent to [facility] for approval.

## 2020-02-28 NOTE — Telephone Encounter (Signed)
Patient has request refill on medication "Tramadol 50 mg". Patient last refill was 01/26/2020 with 90 tablets to be taken every 8 hours as needed. Medication pend and sent to PCP Lauree Chandler, NP . Please Advise.

## 2020-03-05 ENCOUNTER — Other Ambulatory Visit: Payer: Medicare Other

## 2020-03-05 DIAGNOSIS — E785 Hyperlipidemia, unspecified: Secondary | ICD-10-CM

## 2020-03-05 DIAGNOSIS — I10 Essential (primary) hypertension: Secondary | ICD-10-CM

## 2020-03-06 ENCOUNTER — Other Ambulatory Visit: Payer: Medicare Other

## 2020-03-07 ENCOUNTER — Other Ambulatory Visit: Payer: Self-pay

## 2020-03-07 ENCOUNTER — Other Ambulatory Visit: Payer: Medicare Other

## 2020-03-07 DIAGNOSIS — I10 Essential (primary) hypertension: Secondary | ICD-10-CM | POA: Diagnosis not present

## 2020-03-07 DIAGNOSIS — R7309 Other abnormal glucose: Secondary | ICD-10-CM | POA: Diagnosis not present

## 2020-03-07 DIAGNOSIS — H43811 Vitreous degeneration, right eye: Secondary | ICD-10-CM | POA: Diagnosis not present

## 2020-03-07 DIAGNOSIS — H34832 Tributary (branch) retinal vein occlusion, left eye, with macular edema: Secondary | ICD-10-CM | POA: Diagnosis not present

## 2020-03-07 DIAGNOSIS — E785 Hyperlipidemia, unspecified: Secondary | ICD-10-CM | POA: Diagnosis not present

## 2020-03-09 ENCOUNTER — Ambulatory Visit: Payer: Medicare Other | Admitting: Nurse Practitioner

## 2020-03-09 LAB — LIPID PANEL
Cholesterol: 212 mg/dL — ABNORMAL HIGH (ref <200)
HDL: 72 mg/dL (ref >=50)
LDL Cholesterol (Calc): 123 mg/dL (calc) — ABNORMAL HIGH
Non-HDL Cholesterol (Calc): 140 mg/dL (calc) — ABNORMAL HIGH (ref <130)
Total CHOL/HDL Ratio: 2.9 (calc) (ref <5.0)
Triglycerides: 78 mg/dL (ref <150)

## 2020-03-09 LAB — CBC WITH DIFFERENTIAL/PLATELET
Absolute Monocytes: 315 cells/uL (ref 200–950)
Basophils Absolute: 30 cells/uL (ref 0–200)
Basophils Relative: 1 %
Eosinophils Absolute: 69 cells/uL (ref 15–500)
Eosinophils Relative: 2.3 %
HCT: 36.9 % (ref 35.0–45.0)
Hemoglobin: 12.5 g/dL (ref 11.7–15.5)
Lymphs Abs: 1164 cells/uL (ref 850–3900)
MCH: 31.4 pg (ref 27.0–33.0)
MCHC: 33.9 g/dL (ref 32.0–36.0)
MCV: 92.7 fL (ref 80.0–100.0)
MPV: 11.2 fL (ref 7.5–12.5)
Monocytes Relative: 10.5 %
Neutro Abs: 1422 cells/uL — ABNORMAL LOW (ref 1500–7800)
Neutrophils Relative %: 47.4 %
Platelets: 201 10*3/uL (ref 140–400)
RBC: 3.98 10*6/uL (ref 3.80–5.10)
RDW: 12.5 % (ref 11.0–15.0)
Total Lymphocyte: 38.8 %
WBC: 3 10*3/uL — ABNORMAL LOW (ref 3.8–10.8)

## 2020-03-09 LAB — COMPLETE METABOLIC PANEL WITH GFR
AG Ratio: 1.9 (calc) (ref 1.0–2.5)
ALT: 19 U/L (ref 6–29)
AST: 21 U/L (ref 10–35)
Albumin: 4.3 g/dL (ref 3.6–5.1)
Alkaline phosphatase (APISO): 58 U/L (ref 37–153)
BUN: 15 mg/dL (ref 7–25)
CO2: 31 mmol/L (ref 20–32)
Calcium: 9.7 mg/dL (ref 8.6–10.4)
Chloride: 103 mmol/L (ref 98–110)
Creat: 0.95 mg/dL (ref 0.50–0.99)
GFR, Est African American: 73 mL/min/{1.73_m2} (ref >=60)
GFR, Est Non African American: 63 mL/min/{1.73_m2} (ref >=60)
Globulin: 2.3 g/dL (calc) (ref 1.9–3.7)
Glucose, Bld: 107 mg/dL — ABNORMAL HIGH (ref 65–99)
Potassium: 4.3 mmol/L (ref 3.5–5.3)
Sodium: 141 mmol/L (ref 135–146)
Total Bilirubin: 0.4 mg/dL (ref 0.2–1.2)
Total Protein: 6.6 g/dL (ref 6.1–8.1)

## 2020-03-09 LAB — HEMOGLOBIN A1C
Hgb A1c MFr Bld: 5.9 % of total Hgb — ABNORMAL HIGH (ref <5.7)
Mean Plasma Glucose: 123 mg/dL
eAG (mmol/L): 6.8 mmol/L

## 2020-03-09 LAB — TEST AUTHORIZATION

## 2020-03-12 ENCOUNTER — Ambulatory Visit: Payer: Medicare Other | Admitting: Nurse Practitioner

## 2020-03-14 ENCOUNTER — Ambulatory Visit (INDEPENDENT_AMBULATORY_CARE_PROVIDER_SITE_OTHER): Payer: Medicare Other | Admitting: Nurse Practitioner

## 2020-03-14 ENCOUNTER — Other Ambulatory Visit: Payer: Self-pay

## 2020-03-14 ENCOUNTER — Encounter: Payer: Self-pay | Admitting: Nurse Practitioner

## 2020-03-14 VITALS — BP 110/90 | HR 74 | Temp 97.5°F | Ht 65.0 in | Wt 129.0 lb

## 2020-03-14 DIAGNOSIS — B20 Human immunodeficiency virus [HIV] disease: Secondary | ICD-10-CM | POA: Diagnosis not present

## 2020-03-14 DIAGNOSIS — E039 Hypothyroidism, unspecified: Secondary | ICD-10-CM | POA: Diagnosis not present

## 2020-03-14 DIAGNOSIS — E785 Hyperlipidemia, unspecified: Secondary | ICD-10-CM | POA: Diagnosis not present

## 2020-03-14 DIAGNOSIS — J302 Other seasonal allergic rhinitis: Secondary | ICD-10-CM

## 2020-03-14 DIAGNOSIS — R5383 Other fatigue: Secondary | ICD-10-CM

## 2020-03-14 DIAGNOSIS — I10 Essential (primary) hypertension: Secondary | ICD-10-CM

## 2020-03-14 DIAGNOSIS — M2550 Pain in unspecified joint: Secondary | ICD-10-CM | POA: Diagnosis not present

## 2020-03-14 DIAGNOSIS — M720 Palmar fascial fibromatosis [Dupuytren]: Secondary | ICD-10-CM

## 2020-03-14 DIAGNOSIS — Z23 Encounter for immunization: Secondary | ICD-10-CM

## 2020-03-14 DIAGNOSIS — F331 Major depressive disorder, recurrent, moderate: Secondary | ICD-10-CM

## 2020-03-14 NOTE — Patient Instructions (Addendum)
To use 3 good thing app daily
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
## 2020-03-15 LAB — CBC WITH DIFFERENTIAL/PLATELET
Absolute Monocytes: 319 cells/uL (ref 200–950)
Basophils Absolute: 20 cells/uL (ref 0–200)
Basophils Relative: 0.7 %
Eosinophils Absolute: 78 cells/uL (ref 15–500)
Eosinophils Relative: 2.7 %
HCT: 38.2 % (ref 35.0–45.0)
Hemoglobin: 12.9 g/dL (ref 11.7–15.5)
Lymphs Abs: 1424 cells/uL (ref 850–3900)
MCH: 31.2 pg (ref 27.0–33.0)
MCHC: 33.8 g/dL (ref 32.0–36.0)
MCV: 92.5 fL (ref 80.0–100.0)
MPV: 11.7 fL (ref 7.5–12.5)
Monocytes Relative: 11 %
Neutro Abs: 1059 cells/uL — ABNORMAL LOW (ref 1500–7800)
Neutrophils Relative %: 36.5 %
Platelets: 207 10*3/uL (ref 140–400)
RBC: 4.13 10*6/uL (ref 3.80–5.10)
RDW: 12.6 % (ref 11.0–15.0)
Total Lymphocyte: 49.1 %
WBC: 2.9 10*3/uL — ABNORMAL LOW (ref 3.8–10.8)

## 2020-03-15 LAB — DRUG MONITOR, PANEL 1, W/CONF, URINE
Amphetamines: NEGATIVE ng/mL (ref <500)
Barbiturates: NEGATIVE ng/mL (ref <300)
Benzodiazepines: NEGATIVE ng/mL (ref <100)
Cocaine Metabolite: NEGATIVE ng/mL (ref <150)
Creatinine: 125.5 mg/dL
Marijuana Metabolite: NEGATIVE ng/mL (ref <20)
Methadone Metabolite: NEGATIVE ng/mL (ref <100)
Opiates: NEGATIVE ng/mL (ref <100)
Oxidant: NEGATIVE ug/mL
Oxycodone: NEGATIVE ng/mL (ref <100)
Phencyclidine: NEGATIVE ng/mL (ref <25)
pH: 6.9 (ref 4.5–9.0)

## 2020-03-15 LAB — DM TEMPLATE

## 2020-03-15 LAB — TSH: TSH: 0.29 mIU/L — ABNORMAL LOW (ref 0.40–4.50)

## 2020-03-21 ENCOUNTER — Other Ambulatory Visit: Payer: Self-pay

## 2020-03-21 DIAGNOSIS — R5383 Other fatigue: Secondary | ICD-10-CM

## 2020-03-25 ENCOUNTER — Encounter: Payer: Self-pay | Admitting: Nurse Practitioner

## 2020-03-26 ENCOUNTER — Other Ambulatory Visit: Payer: Self-pay | Admitting: Nurse Practitioner

## 2020-03-26 DIAGNOSIS — M720 Palmar fascial fibromatosis [Dupuytren]: Secondary | ICD-10-CM

## 2020-03-26 DIAGNOSIS — M2550 Pain in unspecified joint: Secondary | ICD-10-CM

## 2020-03-26 NOTE — Telephone Encounter (Signed)
Patient has request refill on medication "Olmesartsan" and "Tramadol". Patient last refill on medication "Olmesartan" was 06/29/2019 and "Tramadol" was 02/28/2020. Medication pend and sent to PCP Lauree Chandler, NP .

## 2020-03-27 ENCOUNTER — Other Ambulatory Visit: Payer: Self-pay | Admitting: Nurse Practitioner

## 2020-03-27 DIAGNOSIS — E039 Hypothyroidism, unspecified: Secondary | ICD-10-CM

## 2020-03-27 MED ORDER — LEVOTHYROXINE SODIUM 50 MCG PO TABS
50.0000 ug | ORAL_TABLET | Freq: Every day | ORAL | 2 refills | Status: DC
Start: 2020-03-27 — End: 2020-04-19

## 2020-04-18 ENCOUNTER — Other Ambulatory Visit: Payer: Self-pay | Admitting: Nurse Practitioner

## 2020-04-18 ENCOUNTER — Other Ambulatory Visit: Payer: Self-pay | Admitting: Endocrinology

## 2020-04-18 DIAGNOSIS — E039 Hypothyroidism, unspecified: Secondary | ICD-10-CM

## 2020-04-18 DIAGNOSIS — F32A Depression, unspecified: Secondary | ICD-10-CM

## 2020-04-18 DIAGNOSIS — G8929 Other chronic pain: Secondary | ICD-10-CM

## 2020-04-19 ENCOUNTER — Other Ambulatory Visit: Payer: Self-pay | Admitting: Endocrinology

## 2020-04-24 ENCOUNTER — Other Ambulatory Visit: Payer: Self-pay

## 2020-04-24 ENCOUNTER — Other Ambulatory Visit (HOSPITAL_COMMUNITY)
Admission: RE | Admit: 2020-04-24 | Discharge: 2020-04-24 | Disposition: A | Discharge status: Home / Self Care | Payer: Medicare Other | Source: Ambulatory Visit | Attending: Infectious Diseases | Admitting: Infectious Diseases

## 2020-04-24 ENCOUNTER — Other Ambulatory Visit: Payer: Medicare Other

## 2020-04-24 DIAGNOSIS — Z79899 Other long term (current) drug therapy: Secondary | ICD-10-CM

## 2020-04-24 DIAGNOSIS — Z113 Encounter for screening for infections with a predominantly sexual mode of transmission: Secondary | ICD-10-CM | POA: Diagnosis present

## 2020-04-24 DIAGNOSIS — B20 Human immunodeficiency virus [HIV] disease: Secondary | ICD-10-CM

## 2020-04-25 LAB — URINE CYTOLOGY ANCILLARY ONLY
Chlamydia: NEGATIVE
Comment: NEGATIVE
Comment: NORMAL
Neisseria Gonorrhea: NEGATIVE

## 2020-04-25 LAB — T-HELPER CELL (CD4) - (RCID CLINIC ONLY)
CD4 % Helper T Cell: 41 % (ref 33–65)
CD4 T Cell Abs: 644 /uL (ref 400–1790)

## 2020-04-26 LAB — COMPREHENSIVE METABOLIC PANEL
AG Ratio: 2 (calc) (ref 1.0–2.5)
ALT: 27 U/L (ref 6–29)
AST: 29 U/L (ref 10–35)
Albumin: 4.3 g/dL (ref 3.6–5.1)
Alkaline phosphatase (APISO): 52 U/L (ref 37–153)
BUN/Creatinine Ratio: 14 (calc) (ref 6–22)
BUN: 15 mg/dL (ref 7–25)
CO2: 33 mmol/L — ABNORMAL HIGH (ref 20–32)
Calcium: 9.9 mg/dL (ref 8.6–10.4)
Chloride: 103 mmol/L (ref 98–110)
Creat: 1.05 mg/dL — ABNORMAL HIGH (ref 0.50–0.99)
Globulin: 2.2 g/dL (calc) (ref 1.9–3.7)
Glucose, Bld: 98 mg/dL (ref 65–99)
Potassium: 4.1 mmol/L (ref 3.5–5.3)
Sodium: 142 mmol/L (ref 135–146)
Total Bilirubin: 0.3 mg/dL (ref 0.2–1.2)
Total Protein: 6.5 g/dL (ref 6.1–8.1)

## 2020-04-26 LAB — CBC
HCT: 35.5 % (ref 35.0–45.0)
Hemoglobin: 11.6 g/dL — ABNORMAL LOW (ref 11.7–15.5)
MCH: 31 pg (ref 27.0–33.0)
MCHC: 32.7 g/dL (ref 32.0–36.0)
MCV: 94.9 fL (ref 80.0–100.0)
MPV: 11.7 fL (ref 7.5–12.5)
Platelets: 197 10*3/uL (ref 140–400)
RBC: 3.74 10*6/uL — ABNORMAL LOW (ref 3.80–5.10)
RDW: 13.1 % (ref 11.0–15.0)
WBC: 3.4 10*3/uL — ABNORMAL LOW (ref 3.8–10.8)

## 2020-04-26 LAB — LIPID PANEL
Cholesterol: 225 mg/dL — ABNORMAL HIGH (ref <200)
HDL: 65 mg/dL (ref >=50)
LDL Cholesterol (Calc): 130 mg/dL (calc) — ABNORMAL HIGH
Non-HDL Cholesterol (Calc): 160 mg/dL (calc) — ABNORMAL HIGH (ref <130)
Total CHOL/HDL Ratio: 3.5 (calc) (ref <5.0)
Triglycerides: 167 mg/dL — ABNORMAL HIGH (ref <150)

## 2020-04-26 LAB — RPR: RPR Ser Ql: NONREACTIVE

## 2020-04-26 LAB — HIV-1 RNA QUANT-NO REFLEX-BLD
HIV 1 RNA Quant: NOT DETECTED Copies/mL
HIV-1 RNA Quant, Log: NOT DETECTED Log cps/mL

## 2020-04-27 ENCOUNTER — Other Ambulatory Visit: Payer: Self-pay | Admitting: Family

## 2020-04-27 DIAGNOSIS — M720 Palmar fascial fibromatosis [Dupuytren]: Secondary | ICD-10-CM

## 2020-04-27 DIAGNOSIS — M2550 Pain in unspecified joint: Secondary | ICD-10-CM

## 2020-05-08 ENCOUNTER — Encounter: Payer: Medicare Other | Admitting: Infectious Diseases

## 2020-05-10 ENCOUNTER — Other Ambulatory Visit: Payer: Self-pay

## 2020-05-10 ENCOUNTER — Encounter: Payer: Self-pay | Admitting: Infectious Diseases

## 2020-05-10 ENCOUNTER — Ambulatory Visit (INDEPENDENT_AMBULATORY_CARE_PROVIDER_SITE_OTHER): Payer: Medicare Other | Admitting: Infectious Diseases

## 2020-05-10 VITALS — BP 128/76 | HR 65 | Wt 133.0 lb

## 2020-05-10 DIAGNOSIS — Z79899 Other long term (current) drug therapy: Secondary | ICD-10-CM | POA: Diagnosis not present

## 2020-05-10 DIAGNOSIS — E890 Postprocedural hypothyroidism: Secondary | ICD-10-CM | POA: Diagnosis not present

## 2020-05-10 DIAGNOSIS — M542 Cervicalgia: Secondary | ICD-10-CM | POA: Diagnosis not present

## 2020-05-10 DIAGNOSIS — B20 Human immunodeficiency virus [HIV] disease: Secondary | ICD-10-CM | POA: Diagnosis not present

## 2020-05-10 DIAGNOSIS — Z113 Encounter for screening for infections with a predominantly sexual mode of transmission: Secondary | ICD-10-CM

## 2020-05-10 NOTE — Assessment & Plan Note (Signed)
Appreciate her f/u with endo Her last TSH was slightly low.

## 2020-05-10 NOTE — Progress Notes (Signed)
   Subjective:    Patient ID: Laurie Gray, female  DOB: Dec 16, 1955, 65 y.o.        MRN: [id number]   HPI 65yo F with hx of dx 2000 HIV+, depression, hyperlipidemia, prev lap hysterecomy-BSO (2009). Previouslyon D4T monotherapy,atripla -->odefsy.  Had previous abn on colpo (SIL).Had hysterectomy many years ago (~ 25) then ovaries 15 yrs ago.  She had mammo 02-2017 which showed R cysts.Since normal mammos.   Had colon 2013.Hadstool test (-) x 22019.  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  She also got radiocative Iodine Has had Endo f/u in the last 3 months.   Also hx of dupteryn's contractures- she has been seen by hand surgery:   Daugter is outside waiting her for her, needs to have abrievated visit.  Feels tired.  Worried about CBC. Just started MVI with iron, B12 oral.   eating a lot of spinach, less meat. Doing mediterranean diet.    HIV 1 RNA Quant  Date Value  04/24/2020 Not Detected Copies/mL  07/25/2019 <20 NOT DETECTED copies/mL  09/30/2018 <20 NOT DETECTED copies/mL   CD4 T Cell Abs (/uL)  Date Value  04/24/2020 644  07/25/2019 830  09/30/2018 621     Health Maintenance  Topic Date Due  . TETANUS/TDAP  Never done  . COVID-19 Vaccine (4 - Booster for Pfizer series) 04/29/2020  . INFLUENZA VACCINE  08/06/2020  . DEXA SCAN  10/28/2020  . MAMMOGRAM  03/08/2021  . COLONOSCOPY (Pts 45-35yrs Insurance coverage will need to be confirmed)  01/09/2025  . Hepatitis C Screening  Completed  . HIV Screening  Completed  . HPV VACCINES  Aged Out    Review of Systems  Constitutional: Positive for malaise/fatigue. Negative for chills and fever.  Gastrointestinal: Negative for constipation and diarrhea.  Genitourinary: Negative for dysuria.   Decreased exercise, decreased energy R shoulder and neck pain, attributes to how she slept on it.  Please see HPI. All other systems reviewed and negative.      Objective:  Physical Exam Vitals reviewed.  Constitutional:      Appearance: Normal appearance.  HENT:     Mouth/Throat:     Mouth: Mucous membranes are moist.     Pharynx: No oropharyngeal exudate.  Eyes:     Extraocular Movements: Extraocular movements intact.     Pupils: Pupils are equal, round, and reactive to light.  Cardiovascular:     Rate and Rhythm: Normal rate and regular rhythm.  Pulmonary:     Effort: Pulmonary effort is normal.     Breath sounds: Normal breath sounds.  Abdominal:     General: Bowel sounds are normal. There is no distension.     Palpations: Abdomen is soft.     Tenderness: There is no abdominal tenderness.  Musculoskeletal:     Cervical back: Normal range of motion and neck supple. No rigidity or tenderness.     Right lower leg: No edema.     Left lower leg: No edema.  Neurological:     Mental Status: She is alert.  Psychiatric:        Mood and Affect: Mood normal.            Assessment & Plan:

## 2020-05-10 NOTE — Assessment & Plan Note (Signed)
She is doing well on odefsy Offered/refused condoms.  Has gotten covid vax Will send her for mammo, pap (?) with hx of SIL and hysterectomy Will see her back in 9 months with labs prior We reviewed her labs, I tried to reassure her that her anemia was mild, her Glc was now normal.

## 2020-05-10 NOTE — Assessment & Plan Note (Signed)
She suggests this is positional. I wonder if there is a component of torticolis.

## 2020-05-15 ENCOUNTER — Other Ambulatory Visit: Payer: Self-pay | Admitting: Infectious Diseases

## 2020-05-15 ENCOUNTER — Other Ambulatory Visit: Payer: Medicare Other

## 2020-05-15 ENCOUNTER — Other Ambulatory Visit: Payer: Self-pay

## 2020-05-15 DIAGNOSIS — R5383 Other fatigue: Secondary | ICD-10-CM

## 2020-05-15 DIAGNOSIS — Z1231 Encounter for screening mammogram for malignant neoplasm of breast: Secondary | ICD-10-CM

## 2020-05-16 LAB — TSH: TSH: 24.75 mIU/L — ABNORMAL HIGH (ref 0.40–4.50)

## 2020-05-24 ENCOUNTER — Other Ambulatory Visit: Payer: Self-pay | Admitting: Infectious Diseases

## 2020-05-24 DIAGNOSIS — B20 Human immunodeficiency virus [HIV] disease: Secondary | ICD-10-CM

## 2020-05-28 ENCOUNTER — Other Ambulatory Visit: Payer: Self-pay | Admitting: Nurse Practitioner

## 2020-05-28 DIAGNOSIS — M2550 Pain in unspecified joint: Secondary | ICD-10-CM

## 2020-05-28 DIAGNOSIS — M720 Palmar fascial fibromatosis [Dupuytren]: Secondary | ICD-10-CM

## 2020-05-28 NOTE — Telephone Encounter (Signed)
Patient has request refill on medication "Tramadol 50mg ". Patient last refill was 04/27/2020. Patient was given 90 tablets to take every 8 hours as needed. Medication pend and routed to PCP Dewaine Oats, Carlos American, NP for approval.

## 2020-05-29 ENCOUNTER — Encounter: Payer: Self-pay | Admitting: Nurse Practitioner

## 2020-06-07 ENCOUNTER — Ambulatory Visit: Payer: Medicare Other | Admitting: Endocrinology

## 2020-06-22 ENCOUNTER — Encounter: Payer: Self-pay | Admitting: Obstetrics and Gynecology

## 2020-06-22 ENCOUNTER — Other Ambulatory Visit (HOSPITAL_COMMUNITY)
Admission: RE | Admit: 2020-06-22 | Discharge: 2020-06-22 | Disposition: A | Discharge status: Home / Self Care | Payer: Medicare Other | Source: Ambulatory Visit | Attending: Obstetrics and Gynecology | Admitting: Obstetrics and Gynecology

## 2020-06-22 ENCOUNTER — Ambulatory Visit (INDEPENDENT_AMBULATORY_CARE_PROVIDER_SITE_OTHER): Payer: Medicare Other | Admitting: Obstetrics and Gynecology

## 2020-06-22 ENCOUNTER — Other Ambulatory Visit: Payer: Self-pay

## 2020-06-22 ENCOUNTER — Telehealth: Payer: Self-pay | Admitting: Obstetrics and Gynecology

## 2020-06-22 VITALS — BP 122/72 | Ht 65.5 in | Wt 138.0 lb

## 2020-06-22 DIAGNOSIS — Z9189 Other specified personal risk factors, not elsewhere classified: Secondary | ICD-10-CM | POA: Diagnosis not present

## 2020-06-22 DIAGNOSIS — Z23 Encounter for immunization: Secondary | ICD-10-CM

## 2020-06-22 DIAGNOSIS — Z1272 Encounter for screening for malignant neoplasm of vagina: Secondary | ICD-10-CM | POA: Diagnosis not present

## 2020-06-22 DIAGNOSIS — R590 Localized enlarged lymph nodes: Secondary | ICD-10-CM

## 2020-06-22 DIAGNOSIS — Z008 Encounter for other general examination: Secondary | ICD-10-CM

## 2020-06-22 DIAGNOSIS — Z21 Asymptomatic human immunodeficiency virus [HIV] infection status: Secondary | ICD-10-CM

## 2020-06-22 DIAGNOSIS — Z01419 Encounter for gynecological examination (general) (routine) without abnormal findings: Secondary | ICD-10-CM

## 2020-06-22 DIAGNOSIS — N63 Unspecified lump in unspecified breast: Secondary | ICD-10-CM

## 2020-06-22 DIAGNOSIS — Z1239 Encounter for other screening for malignant neoplasm of breast: Secondary | ICD-10-CM

## 2020-06-22 NOTE — Telephone Encounter (Signed)
Please schedule dx bilateral mammogram and left breast/left axillary ultrasound.  Patient has a 1 cm left axillary node palpable.  [facility]

## 2020-06-22 NOTE — Progress Notes (Addendum)
65 y.o. G2P2 Widowed [place] American female here for breast and pelvic exam.   Notes a lump in her left axilla.  Seems to be stable in size.   Retired.  Has 5 grandchildren.   PCP:  [name] NP  No LMP recorded. Patient has had a hysterectomy.           Sexually active: No.    The current method of family planning is hysterectomy. Exercising: Yes walking Smoker:  no  Health Maintenance: Pap:  06/05/2016 Neg, 06/01/15 - normal, neg HR HPV, 04/19/14 - normal, 04/06/13 - LGSIL. History of abnormal Pap:  no MMG:  03/09/2019 - BI-RADS1, cat c density.  Colonoscopy:  2017 - Hemorrhoids.  Due  2027. BMD:  10/29/2018  osteopenia, PCP following.  TDaP: ?  States she is due.  HIV: Positive Hep C: 2021 by PCP- Neg Screening Labs: PCP   reports that she has never smoked. She has never used smokeless tobacco. She reports previous alcohol use. She reports that she does not use drugs.  Past Medical History:  Diagnosis Date   Anemia    Anxiety    hx of-not on any meds   Arthritis    "shoulders, arms, fingers, neck, back" (01/31/2016)   Chronic cervical pain    Family history of breast cancer    History of bronchitis    History of colon polyps    History of kidney stones    HIV infection ([place]) dx'd 2000   takes odefsey   Hyperlipidemia    takes Pravastatin daily   Hypertension    takes Benicar HCT daily   Hyperthyroidism    Hypothyroidism    Internal hemorrhoids    Joint pain    Migraine    "in the past" (01/31/2016)   Parathyroid adenoma    Pituitary adenoma ([place])    Refusal of blood transfusions as patient is Jehovah's Witness    "but substitutes are fine". (01/31/2016)   Thyroid cancer ([place]) 12/2015   Upper back pain     Past Surgical History:  Procedure Laterality Date   ABDOMINAL HYSTERECTOMY     BREAST EXCISIONAL BIOPSY Left    no scar seen    BUNIONECTOMY Right    COLONOSCOPY     CYSTOSCOPY     HAMMER TOE SURGERY Left    "middle toe was broke; turned into  hammertoe then repaired"   HAMMER TOE SURGERY Bilateral    "both small toes"   PARATHYROIDECTOMY Right 01/31/2016   PARATHYROIDECTOMY Right 01/31/2016   Procedure: PARATHYROIDECTOMY ON RIGHT SIDE;  Surgeon: Izora Gala, MD;  Location: [facility];  Service: ENT;  Laterality: Right;   SALPINGOOPHORECTOMY Bilateral    THYROIDECTOMY N/A 01/31/2016   Procedure: TOTAL THYROIDECTOMY;  Surgeon: Izora Gala, MD;  Location: [facility];  Service: ENT;  Laterality: N/A;   TOE SURGERY     bunion   TONSILLECTOMY     as a child   TOTAL THYROIDECTOMY  01/31/2016   TUBAL LIGATION     30 yrs ago    Current Outpatient Medications  Medication Sig Dispense Refill   DULoxetine (CYMBALTA) 30 MG capsule TAKE 1 CAPSULE BY MOUTH EVERY DAY 90 capsule 1   levothyroxine (SYNTHROID) 75 MCG tablet TAKE 1 TABLET (75 MCG TOTAL) BY MOUTH DAILY BEFORE BREAKFAST. 90 tablet 1   Multiple Vitamins-Minerals (MULTIVITAMIN ADULTS 50+ PO) Take 1 tablet by mouth daily.     ODEFSEY 200-25-25 MG TABS tablet TAKE 1 TABLET BY MOUTH EVERY DAY WITH  BREAKFAST 30 tablet 5   olmesartan-hydrochlorothiazide (BENICAR HCT) 20-12.5 MG tablet TAKE 1 TABLET BY MOUTH EVERY DAY 90 tablet 1   pravastatin (PRAVACHOL) 40 MG tablet TAKE 1 TABLET BY MOUTH EVERY DAY 90 tablet 1   traMADol (ULTRAM) 50 MG tablet TAKE 1 TABLET BY MOUTH EVERY 8 HOURS AS NEEDED 90 tablet 0   No current facility-administered medications for this visit.    Family History  Problem Relation Age of Onset   Breast cancer Mother 65   Hypertension Mother    Cancer Mother    Hyperlipidemia Mother    Stroke Mother    Other Father        'vocal dysautonomia'   Breast cancer Sister 58   Thyroid disease Sister    Osteoarthritis Maternal Grandmother    Colon cancer Neg Hx     Review of Systems  Constitutional: Negative.   HENT: Negative.    Eyes: Negative.   Respiratory: Negative.    Cardiovascular: Negative.   Gastrointestinal: Negative.   Endocrine: Negative.    Genitourinary: Negative.   Musculoskeletal: Negative.   Skin: Negative.   Allergic/Immunologic: Negative.   Neurological: Negative.   Hematological: Negative.   Psychiatric/Behavioral: Negative.     Exam:   Ht 5' 5.5" (1.664 m)   Wt 138 lb (62.6 kg)   BMI 22.62 kg/m     General appearance: alert, cooperative and appears stated age Head: normocephalic, without obvious abnormality, atraumatic Neck: no adenopathy, supple, symmetrical, trachea midline and thyroid normal to inspection and palpation Lungs: clear to auscultation bilaterally Breasts: normal appearance, no masses or tenderness, No nipple retraction or dimpling, No nipple discharge or bleeding, 1 cm node palpable in left axilla.  5 mm sized right axillary node.  Heart: regular rate and rhythm Abdomen: soft, non-tender; no masses, no organomegaly Extremities: extremities normal, atraumatic, no cyanosis or edema Skin: skin color, texture, turgor normal. No rashes or lesions Lymph nodes: cervical, supraclavicular, and axillary nodes normal. Neurologic: grossly normal  Pelvic: External genitalia:  no lesions              No abnormal inguinal nodes palpated.              Urethra:  normal appearing urethra with no masses, tenderness or lesions              Bartholins and Skenes: normal                 Vagina: normal appearing vagina with normal color and discharge, no lesions              Cervix: absent              Pap taken: yes Bimanual Exam:  Uterus:  absent              Adnexa: no mass, fullness, tenderness              Rectal exam: yes.  Confirms.              Anus:  normal sphincter tone, no lesions  Chaperone was present for exam.  Assessment:    Screening breast exam.  Left axillary node 1 cm, nontender. FH of breast cancer.  Patient has had negative genetic testing.  Pelvic exam without abnormal findings.  Status post TAH for fibroids. Hx LGSIL 2015.  Vaginal cancer screening today. Status post BSO  2009 HIV positive.  HIV RNA quant negative.   Plan: Mammogram dx and left breast US/Axillary  [place] Self breast awareness reviewed. Pap and reflex HR HPV. Guidelines for Calcium, Vitamin D, regular exercise program including cardiovascular and weight bearing exercise. TDap today.  Follow up in 2 years and prn.   36 min total time was spent for this patient encounter, including preparation, face-to-face counseling with the patient, coordination of care, and documentation of the encounter.

## 2020-06-22 NOTE — Patient Instructions (Signed)

## 2020-06-25 LAB — CYTOLOGY - PAP: Diagnosis: NEGATIVE

## 2020-06-25 NOTE — Telephone Encounter (Signed)
Patient scheduled at the breast center on 07/19/20 @ 2:30pm, left message for patient to call.

## 2020-06-28 ENCOUNTER — Other Ambulatory Visit: Payer: Self-pay | Admitting: Nurse Practitioner

## 2020-06-28 ENCOUNTER — Other Ambulatory Visit: Payer: Self-pay | Admitting: Family

## 2020-06-28 DIAGNOSIS — M720 Palmar fascial fibromatosis [Dupuytren]: Secondary | ICD-10-CM

## 2020-06-28 DIAGNOSIS — M2550 Pain in unspecified joint: Secondary | ICD-10-CM

## 2020-06-28 NOTE — Telephone Encounter (Signed)
RX last filled on 05/29/2020  Treatment agreement on file from March 2022

## 2020-07-04 NOTE — Telephone Encounter (Signed)
Called patient again and she picked up she asked I send a my chart message with time and date.

## 2020-07-09 ENCOUNTER — Other Ambulatory Visit: Payer: Self-pay | Admitting: Nurse Practitioner

## 2020-07-09 DIAGNOSIS — E039 Hypothyroidism, unspecified: Secondary | ICD-10-CM

## 2020-07-10 ENCOUNTER — Encounter: Payer: Self-pay | Admitting: Endocrinology

## 2020-07-10 ENCOUNTER — Encounter: Payer: Self-pay | Admitting: Nurse Practitioner

## 2020-07-10 ENCOUNTER — Other Ambulatory Visit: Payer: Self-pay | Admitting: Endocrinology

## 2020-07-10 MED ORDER — LEVOTHYROXINE SODIUM 75 MCG PO TABS: 75.0000 ug | ORAL_TABLET | Freq: Every day | ORAL | 1 refills | Status: DC

## 2020-07-10 NOTE — Telephone Encounter (Signed)
It is prescribed by her endocrinologist.

## 2020-07-13 ENCOUNTER — Ambulatory Visit: Payer: Medicare Other | Admitting: Nurse Practitioner

## 2020-07-19 ENCOUNTER — Ambulatory Visit
Admission: RE | Admit: 2020-07-19 | Discharge: 2020-07-19 | Disposition: A | Discharge status: Home / Self Care | Payer: Medicare Other | Source: Ambulatory Visit | Attending: Obstetrics and Gynecology | Admitting: Obstetrics and Gynecology

## 2020-07-19 ENCOUNTER — Other Ambulatory Visit: Payer: Self-pay | Admitting: Obstetrics and Gynecology

## 2020-07-19 ENCOUNTER — Other Ambulatory Visit: Payer: Self-pay

## 2020-07-19 DIAGNOSIS — N63 Unspecified lump in unspecified breast: Secondary | ICD-10-CM

## 2020-07-19 DIAGNOSIS — N6489 Other specified disorders of breast: Secondary | ICD-10-CM | POA: Diagnosis not present

## 2020-07-19 DIAGNOSIS — R922 Inconclusive mammogram: Secondary | ICD-10-CM | POA: Diagnosis not present

## 2020-07-26 ENCOUNTER — Other Ambulatory Visit: Payer: Self-pay | Admitting: Nurse Practitioner

## 2020-07-26 DIAGNOSIS — M2550 Pain in unspecified joint: Secondary | ICD-10-CM

## 2020-07-26 DIAGNOSIS — M720 Palmar fascial fibromatosis [Dupuytren]: Secondary | ICD-10-CM

## 2020-07-27 DIAGNOSIS — Z20822 Contact with and (suspected) exposure to covid-19: Secondary | ICD-10-CM | POA: Diagnosis not present

## 2020-07-27 NOTE — Telephone Encounter (Signed)
Medication pended and sent to Dinah Ngetich,NP for approval

## 2020-08-17 ENCOUNTER — Encounter: Payer: Self-pay | Admitting: Infectious Diseases

## 2020-08-17 ENCOUNTER — Encounter: Payer: Self-pay | Admitting: Nurse Practitioner

## 2020-08-17 NOTE — Telephone Encounter (Signed)
Message routed to [place], Jessica K, NP  

## 2020-08-26 ENCOUNTER — Other Ambulatory Visit: Payer: Self-pay | Admitting: Family

## 2020-08-26 DIAGNOSIS — M720 Palmar fascial fibromatosis [Dupuytren]: Secondary | ICD-10-CM

## 2020-08-26 DIAGNOSIS — M2550 Pain in unspecified joint: Secondary | ICD-10-CM

## 2020-08-27 NOTE — Telephone Encounter (Signed)
Non-opioid treatment agreement on file from: 03/14/2020  RX last filled on 07/27/20

## 2020-09-04 ENCOUNTER — Ambulatory Visit (INDEPENDENT_AMBULATORY_CARE_PROVIDER_SITE_OTHER): Payer: Medicare Other | Admitting: Endocrinology

## 2020-09-04 ENCOUNTER — Other Ambulatory Visit: Payer: Self-pay

## 2020-09-04 VITALS — BP 116/74 | HR 63 | Ht 65.5 in | Wt 135.2 lb

## 2020-09-04 DIAGNOSIS — C73 Malignant neoplasm of thyroid gland: Secondary | ICD-10-CM

## 2020-09-04 DIAGNOSIS — E213 Hyperparathyroidism, unspecified: Secondary | ICD-10-CM | POA: Diagnosis not present

## 2020-09-04 DIAGNOSIS — E890 Postprocedural hypothyroidism: Secondary | ICD-10-CM

## 2020-09-04 LAB — VITAMIN D 25 HYDROXY (VIT D DEFICIENCY, FRACTURES): VITD: 43.22 ng/mL (ref 30.00–100.00)

## 2020-09-04 LAB — TSH: TSH: 1.92 u[IU]/mL (ref 0.35–5.50)

## 2020-09-04 LAB — T4, FREE: Free T4: 0.83 ng/dL (ref 0.60–1.60)

## 2020-09-04 NOTE — Progress Notes (Signed)
Subjective:    Patient ID: Laurie Gray, female    DOB: September 30, 1955, 65 y.o.   MRN: [id number]  HPI Pt returns for f/u stage 1 PTC, with this chronology: 1/18: thyroidectomy: PAPILLARY CARCINOMA, 0.9 CM. - FOCAL MICROSCOPIC EXTRATHYROIDAL EXTENSION.  INVOLVES MARGIN.   3/18: RAI rx with thyrogen, 109 mCi 3/18: post-therapy scan: uptake in the thyroid bed only 1/19: TG=0.1 (Ab neg) 2/20: TG=0.1 (Ab neg) 10/20: TG=0.1 (Ab neg) 6/21  TG=0.1 (Ab neg) Pt also has pituitary cyst (dx'ed 2005, when MRI showed 3.5 x 6.5 mm cyst; f/u in 2020 was unchanged; pit function was normal in 2020).   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  She has intermitt HA and fatigue.  Pt also had parathyroidect in 2018.  She takes multivitamin, but not a dedicated Vit-D supplement.  Past Medical History:  Diagnosis Date   Anemia    Anxiety    hx of-not on any meds   Arthritis    "shoulders, arms, fingers, neck, back" (01/31/2016)   Chronic cervical pain    Family history of breast cancer    History of bronchitis    History of colon polyps    History of kidney stones    HIV infection ([place]) dx'd 2000   takes odefsey   Hyperlipidemia    takes Pravastatin daily   Hypertension    takes Benicar HCT daily   Hyperthyroidism    Hypothyroidism    Internal hemorrhoids    Joint pain    Migraine    "in the past" (01/31/2016)   Parathyroid adenoma    Pituitary adenoma ([place])    Refusal of blood transfusions as patient is Jehovah's Witness    "but substitutes are fine". (01/31/2016)   Thyroid cancer ([place]) 12/2015   Upper back pain     Past Surgical History:  Procedure Laterality Date   ABDOMINAL HYSTERECTOMY     BREAST EXCISIONAL BIOPSY Left    no scar seen    BUNIONECTOMY Right    COLONOSCOPY     CYSTOSCOPY     HAMMER TOE SURGERY Left    "middle toe was broke; turned into hammertoe then repaired"   HAMMER TOE SURGERY Bilateral    "both small toes"   PARATHYROIDECTOMY Right  01/31/2016   PARATHYROIDECTOMY Right 01/31/2016   Procedure: PARATHYROIDECTOMY ON RIGHT SIDE;  Surgeon: Izora Gala, MD;  Location: [facility] OR;  Service: ENT;  Laterality: Right;   SALPINGOOPHORECTOMY Bilateral    THYROIDECTOMY N/A 01/31/2016   Procedure: TOTAL THYROIDECTOMY;  Surgeon: Izora Gala, MD;  Location: [place];  Service: ENT;  Laterality: N/A;   TOE SURGERY     bunion   TONSILLECTOMY     as a child   TOTAL THYROIDECTOMY  01/31/2016   TUBAL LIGATION     30 yrs ago    Social History   Socioeconomic History   Marital status: Widowed    Spouse name: Not on file   Number of children: Not on file   Years of education: Not on file   Highest education level: Not on file  Occupational History   Not on file  Tobacco Use   Smoking status: Never   Smokeless tobacco: Never  Vaping Use   Vaping Use: Never used  Substance and Sexual Activity   Alcohol use: Not Currently    Alcohol/week: 0.0 standard drinks    Comment: 01/31/2016 "might go out to eat and have a drink once/month; no more than that"   Drug use:  No   Sexual activity: Not Currently    Partners: Male    Comment: declined condoms  Other Topics Concern   Not on file  Social History Narrative   Normal diet   Drinks Caffeine- coffee/coke/chocolate    Lives in an apartment/ third level/1 person/1 pet   Past profession- Museum/gallery exhibitions officer, Theatre manager   Exercises 3-4 x weekly (cardio and weights)       Social Determinants of Health   Financial Resource Strain: Not on file  Food Insecurity: Not on file  Transportation Needs: Not on file  Physical Activity: Not on file  Stress: Not on file  Social Connections: Not on file  Intimate Partner Violence: Not on file    Current Outpatient Medications on File Prior to Visit  Medication Sig Dispense Refill   DULoxetine (CYMBALTA) 30 MG capsule TAKE 1 CAPSULE BY MOUTH EVERY DAY 90 capsule 1   levothyroxine (SYNTHROID) 75 MCG tablet Take 1 tablet (75 mcg total) by mouth daily  before breakfast. 90 tablet 1   Multiple Vitamins-Minerals (MULTIVITAMIN ADULTS 50+ PO) Take 1 tablet by mouth daily.     ODEFSEY 200-25-25 MG TABS tablet TAKE 1 TABLET BY MOUTH EVERY DAY WITH BREAKFAST 30 tablet 5   olmesartan-hydrochlorothiazide (BENICAR HCT) 20-12.5 MG tablet TAKE 1 TABLET BY MOUTH EVERY DAY 90 tablet 1   pravastatin (PRAVACHOL) 40 MG tablet TAKE 1 TABLET BY MOUTH EVERY DAY 90 tablet 1   traMADol (ULTRAM) 50 MG tablet TAKE 1 TABLET BY MOUTH EVERY 8 HOURS AS NEEDED 90 tablet 0   No current facility-administered medications on file prior to visit.    Allergies  Allergen Reactions   Crestor [Rosuvastatin Calcium]     myalgias   Pravastatin     myalgias   Sulfonamide Derivatives Swelling    SWELLING REACTION UNSPECIFIED     Family History  Problem Relation Age of Onset   Breast cancer Mother 42   Hypertension Mother    Cancer Mother    Hyperlipidemia Mother    Stroke Mother    Other Father        'vocal dysautonomia'   Breast cancer Sister 78   Thyroid disease Sister    Osteoarthritis Maternal Grandmother    Colon cancer Neg Hx     BP 116/74 (BP Location: Right Arm, Patient Position: Sitting, Cuff Size: Large)   Pulse 63   Ht 5' 5.5" (1.664 m)   Wt 135 lb 3.2 oz (61.3 kg)   SpO2 97%   BMI 22.16 kg/m    Review of Systems Denies numbness and muscle cramps.      Objective:   Physical Exam Neck: a healed scar is present.  I do not appreciate a nodule in the thyroid or elsewhere in the neck.    Lab Results  Component Value Date   TSH 1.92 09/04/2020      Assessment & Plan:  Hypothyroidism: well-controlled.  Please continue the same synthroid.  PTC: recheck today.   Patient Instructions  Blood tests are requested for you today.  We'll let you know about the results.   Please come back for a follow-up appointment in 1 year.

## 2020-09-04 NOTE — Patient Instructions (Signed)
Blood tests are requested for you today.  We'll let you know about the results.  Please come back for a follow-up appointment in 1 year.    

## 2020-09-05 LAB — PTH, INTACT AND CALCIUM
Calcium: 9.9 mg/dL (ref 8.6–10.4)
PTH: 25 pg/mL (ref 16–77)

## 2020-09-05 LAB — THYROGLOBULIN LEVEL: Thyroglobulin: 0.1 ng/mL — ABNORMAL LOW

## 2020-09-05 LAB — THYROGLOBULIN ANTIBODY: Thyroglobulin Ab: <1 IU/mL (ref <=1)

## 2020-09-27 ENCOUNTER — Other Ambulatory Visit: Payer: Self-pay | Admitting: Nurse Practitioner

## 2020-09-27 DIAGNOSIS — F419 Anxiety disorder, unspecified: Secondary | ICD-10-CM

## 2020-09-27 DIAGNOSIS — M2550 Pain in unspecified joint: Secondary | ICD-10-CM

## 2020-09-27 DIAGNOSIS — F32A Depression, unspecified: Secondary | ICD-10-CM

## 2020-09-27 DIAGNOSIS — M720 Palmar fascial fibromatosis [Dupuytren]: Secondary | ICD-10-CM

## 2020-09-27 DIAGNOSIS — G8929 Other chronic pain: Secondary | ICD-10-CM

## 2020-09-27 NOTE — Telephone Encounter (Signed)
Patient has request refill on medication Cymbalta, Pravastatin, and Tramadol. Patient medications have allergy contraindication. Medications pend and sent to PCP Dewaine Oats Carlos American, NP for approval. Please Advise.

## 2020-10-28 ENCOUNTER — Other Ambulatory Visit: Payer: Self-pay | Admitting: Nurse Practitioner

## 2020-10-28 DIAGNOSIS — M2550 Pain in unspecified joint: Secondary | ICD-10-CM

## 2020-10-28 DIAGNOSIS — M720 Palmar fascial fibromatosis [Dupuytren]: Secondary | ICD-10-CM

## 2020-10-29 NOTE — Telephone Encounter (Signed)
Patient has request refill on medication "Tramadol 50mg ". Patient last refill was 09/27/2020. Patient has Non Opioid contract on file that was signed on 04/09/2020. Medication pend and sent to PCP Dewaine Oats Carlos American, NP for approval. Please Advise.

## 2020-11-02 ENCOUNTER — Telehealth: Payer: Self-pay | Admitting: Nurse Practitioner

## 2020-11-02 NOTE — Telephone Encounter (Signed)
I called the patient and left a vm to call us back to schedule a follow up appt since she missed her 4 Month follow up in July

## 2020-12-01 ENCOUNTER — Other Ambulatory Visit: Payer: Self-pay | Admitting: Nurse Practitioner

## 2020-12-01 DIAGNOSIS — M720 Palmar fascial fibromatosis [Dupuytren]: Secondary | ICD-10-CM

## 2020-12-01 DIAGNOSIS — M2550 Pain in unspecified joint: Secondary | ICD-10-CM

## 2020-12-03 NOTE — Telephone Encounter (Signed)
Patient has request refill on medication "Tramadol". Patient last refill on medication dated 10/29/20. Patient has Non Opioid contract on file dated 04/09/2020. Patient medication pend and sent to PCP Laurie Oats Carlos American, NP for approval. Please Advise.

## 2020-12-28 ENCOUNTER — Telehealth: Payer: Self-pay | Admitting: Nurse Practitioner

## 2020-12-28 ENCOUNTER — Other Ambulatory Visit: Payer: Self-pay | Admitting: Nurse Practitioner

## 2020-12-28 ENCOUNTER — Other Ambulatory Visit: Payer: Self-pay | Admitting: Infectious Diseases

## 2020-12-28 DIAGNOSIS — F419 Anxiety disorder, unspecified: Secondary | ICD-10-CM

## 2020-12-28 DIAGNOSIS — M720 Palmar fascial fibromatosis [Dupuytren]: Secondary | ICD-10-CM

## 2020-12-28 DIAGNOSIS — M2550 Pain in unspecified joint: Secondary | ICD-10-CM

## 2020-12-28 DIAGNOSIS — B20 Human immunodeficiency virus [HIV] disease: Secondary | ICD-10-CM

## 2020-12-28 NOTE — Telephone Encounter (Signed)
I called pt and left a vm to call back and schedule next AWV

## 2020-12-28 NOTE — Telephone Encounter (Signed)
Patient has request refill on medication Cymbalta, and Tramadol. Patient has request refill on Tramadol too early. However Cymbalta needs to be refilled. Medication has warnings. Please Advise.

## 2020-12-30 ENCOUNTER — Other Ambulatory Visit: Payer: Self-pay | Admitting: Nurse Practitioner

## 2020-12-30 DIAGNOSIS — M2550 Pain in unspecified joint: Secondary | ICD-10-CM

## 2020-12-30 DIAGNOSIS — M720 Palmar fascial fibromatosis [Dupuytren]: Secondary | ICD-10-CM

## 2021-01-01 NOTE — Telephone Encounter (Signed)
RX last filled on 12/03/20, treatment agreement on file from [facility]

## 2021-01-25 ENCOUNTER — Other Ambulatory Visit: Payer: Self-pay

## 2021-01-25 ENCOUNTER — Ambulatory Visit (INDEPENDENT_AMBULATORY_CARE_PROVIDER_SITE_OTHER): Payer: Medicare Other | Admitting: Nurse Practitioner

## 2021-01-25 ENCOUNTER — Encounter: Payer: Self-pay | Admitting: Nurse Practitioner

## 2021-01-25 VITALS — BP 112/76 | HR 68 | Temp 97.7°F | Ht 65.5 in | Wt 134.0 lb

## 2021-01-25 DIAGNOSIS — I10 Essential (primary) hypertension: Secondary | ICD-10-CM | POA: Diagnosis not present

## 2021-01-25 DIAGNOSIS — E890 Postprocedural hypothyroidism: Secondary | ICD-10-CM

## 2021-01-25 DIAGNOSIS — B20 Human immunodeficiency virus [HIV] disease: Secondary | ICD-10-CM

## 2021-01-25 DIAGNOSIS — M2550 Pain in unspecified joint: Secondary | ICD-10-CM

## 2021-01-25 DIAGNOSIS — M720 Palmar fascial fibromatosis [Dupuytren]: Secondary | ICD-10-CM

## 2021-01-25 DIAGNOSIS — J383 Other diseases of vocal cords: Secondary | ICD-10-CM | POA: Diagnosis not present

## 2021-01-25 DIAGNOSIS — E213 Hyperparathyroidism, unspecified: Secondary | ICD-10-CM | POA: Diagnosis not present

## 2021-01-25 DIAGNOSIS — F331 Major depressive disorder, recurrent, moderate: Secondary | ICD-10-CM

## 2021-01-25 DIAGNOSIS — E2839 Other primary ovarian failure: Secondary | ICD-10-CM

## 2021-01-25 DIAGNOSIS — E785 Hyperlipidemia, unspecified: Secondary | ICD-10-CM

## 2021-01-25 NOTE — Patient Instructions (Addendum)
Decrease cymbalta to every other day for 1 week then every 3 days for a week then stop.   Follow up in 3 weeks (telephone visit)  3 w

## 2021-01-25 NOTE — Progress Notes (Signed)
Careteam: Patient Care Team: Lauree Chandler, NP as PCP - General (Geriatric Medicine) Campbell Riches, MD as PCP - Infectious Diseases (Infectious Diseases) Jackolyn Confer, MD as Consulting Physician (General Surgery)  PLACE OF SERVICE:  [place] Directive information Does Patient Have a Medical Advance Directive?: No, Would patient like information on creating a medical advance directive?: Yes (MAU/Ambulatory/Procedural Areas - Information given)  Allergies  Allergen Reactions   Crestor [Rosuvastatin Calcium]     myalgias   Pravastatin     myalgias   Sulfonamide Derivatives Swelling    SWELLING REACTION UNSPECIFIED     Chief Complaint  Patient presents with   Medical Management of Chronic Issues    6-7 month follow-up and discuss need for PCV, covid boosters, and DEXA or post pone if patient refuses. Patient c/o tiredness and headaches (worse in the morning).     HPI: Patient is a 66 y.o. female for routine follow up.   Continues to help with her grandchildren, does not watch them as much. Got a cat which has really helped her anxiety and depression.also staying busy. Going to bible study.   Using tramadol every 6 hours - continues to have headaches which tramadol does help. Gets worse when she goes longer than 6 hours between doses. Been ongoing for a while.   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    Continues to have headaches, blurred vision in left eye (chronic- unchanged) no visual changes to right eye.  No dizziness, denies being off balance.  Review of Systems:  Review of Systems  Constitutional:  Negative for chills, fever and weight loss.  HENT:  Negative for tinnitus.   Respiratory:  Negative for cough, sputum production and shortness of breath.   Cardiovascular:  Negative for chest pain, palpitations and leg swelling.  Gastrointestinal:  Negative for abdominal pain, constipation,  diarrhea and heartburn.  Genitourinary:  Negative for dysuria, frequency and urgency.  Musculoskeletal:  Positive for joint pain and myalgias. Negative for back pain and falls.  Skin: Negative.   Neurological:  Positive for headaches. Negative for dizziness.  Psychiatric/Behavioral:  Negative for depression and memory loss. The patient does not have insomnia.    Past Medical History:  Diagnosis Date   Anemia    Anxiety    hx of-not on any meds   Arthritis    "shoulders, arms, fingers, neck, back" (01/31/2016)   Chronic cervical pain    Family history of breast cancer    History of bronchitis    History of colon polyps    History of kidney stones    HIV infection ([place]) dx'd 2000   takes odefsey   Hyperlipidemia    takes Pravastatin daily   Hypertension    takes Benicar HCT daily   Hyperthyroidism    Hypothyroidism    Internal hemorrhoids    Joint pain    Migraine    "in the past" (01/31/2016)   Parathyroid adenoma    Pituitary adenoma ([place])    Refusal of blood transfusions as patient is Jehovah's Witness    "but substitutes are fine". (01/31/2016)   Thyroid cancer ([place]) 12/2015   Upper back pain    Past Surgical History:  Procedure Laterality Date   ABDOMINAL HYSTERECTOMY     BREAST EXCISIONAL BIOPSY Left    no scar seen    BUNIONECTOMY Right    COLONOSCOPY     CYSTOSCOPY  HAMMER TOE SURGERY Left   ° "middle toe was broke; turned into hammertoe then repaired"  ° HAMMER TOE SURGERY Bilateral   ° "both small toes"  ° PARATHYROIDECTOMY Right 01/31/2016  ° PARATHYROIDECTOMY Right 01/31/2016  ° Procedure: PARATHYROIDECTOMY ON RIGHT SIDE;  Surgeon: Jefry Rosen, MD;  Location: [facility] OR;  Service: ENT;  Laterality: Right;  ° SALPINGOOPHORECTOMY Bilateral   ° THYROIDECTOMY N/A 01/31/2016  ° Procedure: TOTAL THYROIDECTOMY;  Surgeon: Jefry Rosen, MD;  Location: [facility] OR;  Service: ENT;  Laterality: N/A;  ° TOE SURGERY    ° bunion  ° TONSILLECTOMY    ° as a child  ° TOTAL THYROIDECTOMY   01/31/2016  ° TUBAL LIGATION    ° 30 yrs ago  ° °Social History: °  reports that she has never smoked. She has never used smokeless tobacco. She reports that she does not currently use alcohol. She reports that she does not use drugs. ° °Family History  °Problem Relation Age of Onset  ° Breast cancer Mother 30  ° Hypertension Mother   ° Cancer Mother   ° Hyperlipidemia Mother   ° Stroke Mother   ° Other Father   °     'vocal dysautonomia'  ° Breast cancer Sister 66  ° Thyroid disease Sister   ° Osteoarthritis Maternal Grandmother   ° Colon cancer Neg Hx   ° ° °Medications: °Patient's Medications  °New Prescriptions  ° No medications on file  °Previous Medications  ° DULOXETINE (CYMBALTA) 30 MG CAPSULE    TAKE 1 CAPSULE BY MOUTH EVERY DAY  ° LEVOTHYROXINE (SYNTHROID) 75 MCG TABLET    Take 1 tablet (75 mcg total) by mouth daily before breakfast.  ° MULTIPLE VITAMINS-MINERALS (MULTIVITAMIN ADULTS 50+ PO)    Take 1 tablet by mouth daily.  ° ODEFSEY 200-25-25 MG TABS TABLET    TAKE 1 TABLET BY MOUTH EVERY DAY WITH BREAKFAST  ° OLMESARTAN-HYDROCHLOROTHIAZIDE (BENICAR HCT) 20-12.5 MG TABLET    TAKE 1 TABLET BY MOUTH EVERY DAY  ° PRAVASTATIN (PRAVACHOL) 40 MG TABLET    TAKE 1 TABLET BY MOUTH EVERY DAY  ° TRAMADOL (ULTRAM) 50 MG TABLET    TAKE 1 TABLET BY MOUTH EVERY 8 HOURS AS NEEDED  °Modified Medications  ° No medications on file  °Discontinued Medications  ° No medications on file  ° ° °Physical Exam: ° °Vitals:  ° 01/25/21 1310  °BP: 112/76  °Pulse: 68  °Temp: 97.7 °F (36.5 °C)  °TempSrc: Temporal  °SpO2: 98%  °Weight: 134 lb (60.8 kg)  °Height: 5' 5.5" (1.664 m)  ° °Body mass index is 21.96 kg/m². °Wt Readings from Last 3 Encounters:  °01/25/21 134 lb (60.8 kg)  °09/04/20 135 lb 3.2 oz (61.3 kg)  °06/22/20 138 lb (62.6 kg)  ° ° °Physical Exam °Constitutional:   °   General: She is not in acute distress. °   Appearance: She is well-developed. She is not diaphoretic.  °HENT:  °   Head: Normocephalic and atraumatic.  °    Right Ear: Tympanic membrane, ear canal and external ear normal.  °   Left Ear: Tympanic membrane, ear canal and external ear normal.  °   Nose: Nose normal.  °   Mouth/Throat:  °   Pharynx: No oropharyngeal exudate.  °Eyes:  °   Extraocular Movements: Extraocular movements intact.  °   Conjunctiva/sclera: Conjunctivae normal.  °   Pupils: Pupils are equal, round, and reactive to light.  °Cardiovascular:  °     Rate and Rhythm: Normal rate and regular rhythm.  °   Heart sounds: Normal heart sounds.  °Pulmonary:  °   Effort: Pulmonary effort is normal.  °   Breath sounds: Normal breath sounds.  °Abdominal:  °   General: Bowel sounds are normal.  °   Palpations: Abdomen is soft.  °Musculoskeletal:  °   Cervical back: Normal range of motion and neck supple.  °   Right lower leg: No edema.  °   Left lower leg: No edema.  °Skin: °   General: Skin is warm and dry.  °Neurological:  °   Mental Status: She is alert and oriented to person, place, and time. Mental status is at baseline.  °   Cranial Nerves: No cranial nerve deficit.  °   Sensory: No sensory deficit.  °   Motor: No weakness.  °   Coordination: Coordination normal.  °   Gait: Gait normal.  °   Deep Tendon Reflexes: Reflexes normal.  °Psychiatric:     °   Mood and Affect: Mood normal.  ° ° °Labs reviewed: °Basic Metabolic Panel: °Recent Labs  °  03/07/20 °1115 03/14/20 °1533 04/24/20 °1428 05/15/20 °1513 09/04/20 °1425  °NA 141  --  142  --   --   °K 4.3  --  4.1  --   --   °CL 103  --  103  --   --   °CO2 31  --  33*  --   --   °GLUCOSE 107*  --  98  --   --   °BUN 15  --  15  --   --   °CREATININE 0.95  --  1.05*  --   --   °CALCIUM 9.7  --  9.9  --  9.9  °TSH  --  0.29*  --  24.75* 1.92  ° °Liver Function Tests: °Recent Labs  °  03/07/20 °1115 04/24/20 °1428  °AST 21 29  °ALT 19 27  °BILITOT 0.4 0.3  °PROT 6.6 6.5  ° °No results for input(s): LIPASE, AMYLASE in the last 8760 hours. °No results for input(s): AMMONIA in the last 8760 hours. °CBC: °Recent Labs  °   03/07/20 °1115 03/14/20 °1533 04/24/20 °1428  °WBC 3.0* 2.9* 3.4*  °NEUTROABS 1,422* 1,059*  --   °HGB 12.5 12.9 11.6*  °HCT 36.9 38.2 35.5  °MCV 92.7 92.5 94.9  °PLT 201 207 197  ° °Lipid Panel: °Recent Labs  °  03/07/20 °1115 04/24/20 °1428  °CHOL 212* 225*  °HDL 72 65  °LDLCALC 123* 130*  °TRIG 78 167*  °CHOLHDL 2.9 3.5  ° °TSH: °Recent Labs  °  03/14/20 °1533 05/15/20 °1513 09/04/20 °1425  °TSH 0.29* 24.75* 1.92  ° °A1C: °Lab Results  °Component Value Date  ° HGBA1C 5.9 (H) 03/07/2020  ° ° ° °Assessment/Plan °1. Postoperative hypothyroidism °-TSh at goal, continues on synthroid 75 mcg ° °2. Arthralgia, unspecified joint °Ongoing, continues lifestyle modifications with tramadol PRN.  ° °3. Dupuytren's contracture of both hands °-ongoing, continues lifestyle modifications ° °4. Essential hypertension, benign °-Blood pressure well controlled °Continue current medications °Recheck metabolic panel °- CMP with eGFR([facility]) °- CBC with Differential/Platelet ° °5. Human immunodeficiency virus (HIV) disease (HCC) °-stable, continues to follow up with ID. ° °6. Hyperlipidemia, unspecified hyperlipidemia type °-continues on pravastatin with dietary modifications. °- Lipid Panel °- CMP with eGFR([facility]) ° °7. Moderate episode of recurrent major depressive disorder (HCC) °-stable, denies any depression at this time,   she also does have some anxiety but feels like this is controlled. She is on cymbalta and questions if she needs medication, will have her titrate off to see if this benefits headache but will monitor for worsening depression  8. Estrogen deficiency - DG Bone Density; Future  9. 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    Next appt: 3 weeks for mood and pain and headache- (stopping cymbalta) Dantonio Justen K. [place], [facility] [phone]

## 2021-01-26 LAB — COMPLETE METABOLIC PANEL WITH GFR
AG Ratio: 1.8 (calc) (ref 1.0–2.5)
ALT: 15 U/L (ref 6–29)
AST: 20 U/L (ref 10–35)
Albumin: 4.4 g/dL (ref 3.6–5.1)
Alkaline phosphatase (APISO): 56 U/L (ref 37–153)
BUN: 13 mg/dL (ref 7–25)
CO2: 35 mmol/L — ABNORMAL HIGH (ref 20–32)
Calcium: 10 mg/dL (ref 8.6–10.4)
Chloride: 102 mmol/L (ref 98–110)
Creat: 0.98 mg/dL (ref 0.50–1.05)
Globulin: 2.5 g/dL (calc) (ref 1.9–3.7)
Glucose, Bld: 67 mg/dL (ref 65–99)
Potassium: 4 mmol/L (ref 3.5–5.3)
Sodium: 143 mmol/L (ref 135–146)
Total Bilirubin: 0.4 mg/dL (ref 0.2–1.2)
Total Protein: 6.9 g/dL (ref 6.1–8.1)
eGFR: 64 mL/min/{1.73_m2} (ref >=60)

## 2021-01-26 LAB — CBC WITH DIFFERENTIAL/PLATELET
Absolute Monocytes: 339 cells/uL (ref 200–950)
Basophils Absolute: 31 cells/uL (ref 0–200)
Basophils Relative: 0.7 %
Eosinophils Absolute: 79 cells/uL (ref 15–500)
Eosinophils Relative: 1.8 %
HCT: 38.6 % (ref 35.0–45.0)
Hemoglobin: 13.1 g/dL (ref 11.7–15.5)
Lymphs Abs: 2156 cells/uL (ref 850–3900)
MCH: 31.5 pg (ref 27.0–33.0)
MCHC: 33.9 g/dL (ref 32.0–36.0)
MCV: 92.8 fL (ref 80.0–100.0)
MPV: 11.8 fL (ref 7.5–12.5)
Monocytes Relative: 7.7 %
Neutro Abs: 1795 cells/uL (ref 1500–7800)
Neutrophils Relative %: 40.8 %
Platelets: 198 10*3/uL (ref 140–400)
RBC: 4.16 10*6/uL (ref 3.80–5.10)
RDW: 12.8 % (ref 11.0–15.0)
Total Lymphocyte: 49 %
WBC: 4.4 10*3/uL (ref 3.8–10.8)

## 2021-01-26 LAB — LIPID PANEL
Cholesterol: 222 mg/dL — ABNORMAL HIGH (ref <200)
HDL: 72 mg/dL (ref >=50)
LDL Cholesterol (Calc): 118 mg/dL (calc) — ABNORMAL HIGH
Non-HDL Cholesterol (Calc): 150 mg/dL (calc) — ABNORMAL HIGH (ref <130)
Total CHOL/HDL Ratio: 3.1 (calc) (ref <5.0)
Triglycerides: 197 mg/dL — ABNORMAL HIGH (ref <150)

## 2021-01-31 ENCOUNTER — Other Ambulatory Visit: Payer: Self-pay | Admitting: Nurse Practitioner

## 2021-01-31 DIAGNOSIS — M720 Palmar fascial fibromatosis [Dupuytren]: Secondary | ICD-10-CM

## 2021-01-31 DIAGNOSIS — M2550 Pain in unspecified joint: Secondary | ICD-10-CM

## 2021-01-31 NOTE — Telephone Encounter (Signed)
Patient has request refill on medication "Tramadol". Patient last refill dated 01/01/2021. Patient has Non Opioid Contract on file dated 04/09/2020. Medication pend and sent to PCP Dewaine Oats Carlos American, NP for approval.

## 2021-02-05 ENCOUNTER — Other Ambulatory Visit: Payer: Self-pay | Admitting: Nurse Practitioner

## 2021-02-05 DIAGNOSIS — Z1231 Encounter for screening mammogram for malignant neoplasm of breast: Secondary | ICD-10-CM

## 2021-02-09 ENCOUNTER — Other Ambulatory Visit: Payer: Self-pay | Admitting: Nurse Practitioner

## 2021-02-11 NOTE — Telephone Encounter (Signed)
Patient has request refill on medication "Pravastatin 40mg ". Medication last refilled 09/27/2020. Patient medication has Allergy Contraindications. Medication pend and sent to PCP Dewaine Oats Carlos American, NP for approval. Please Advise.

## 2021-02-19 ENCOUNTER — Encounter: Payer: Self-pay | Admitting: Nurse Practitioner

## 2021-02-19 ENCOUNTER — Other Ambulatory Visit: Payer: Self-pay

## 2021-02-19 ENCOUNTER — Ambulatory Visit (INDEPENDENT_AMBULATORY_CARE_PROVIDER_SITE_OTHER): Payer: Medicare Other | Admitting: Nurse Practitioner

## 2021-02-19 ENCOUNTER — Telehealth: Payer: Self-pay

## 2021-02-19 DIAGNOSIS — Z0000 Encounter for general adult medical examination without abnormal findings: Secondary | ICD-10-CM | POA: Diagnosis not present

## 2021-02-19 NOTE — Patient Instructions (Addendum)
Laurie Gray , Thank you for taking time to come for your Medicare Wellness Visit. I appreciate your ongoing commitment to your health goals. Please review the following plan we discussed and let me know if I can assist you in the future.   Screening recommendations/referrals: Colonoscopy up to date Mammogram scheduled Bone Density scheduled Recommended yearly ophthalmology/optometry visit for glaucoma screening and checkup Recommended yearly dental visit for hygiene and checkup  Vaccinations: Influenza vaccine up to date Pneumococcal vaccine DUE- recommend to get at your local pharmacy or in office at your next follow up Tdap vaccine up to date Shingles vaccine up to date    Advanced directives: recommended to complete   Conditions/risks identified: advance age, hyperlipidemia, hypertension   Next appointment: yearly for awv   Preventive Care 32 Years and Older, Female Preventive care refers to lifestyle choices and visits with your health care provider that can promote health and wellness. What does preventive care include? A yearly physical exam. This is also called an annual well check. Dental exams once or twice a year. Routine eye exams. Ask your health care provider how often you should have your eyes checked. Personal lifestyle choices, including: Daily care of your teeth and gums. Regular physical activity. Eating a healthy diet. Avoiding tobacco and drug use. Limiting alcohol use. Practicing safe sex. Taking low-dose aspirin every day. Taking vitamin and mineral supplements as recommended by your health care provider. What happens during an annual well check? The services and screenings done by your health care provider during your annual well check will depend on your age, overall health, lifestyle risk factors, and family history of disease. Counseling  Your health care provider may ask you questions about your: Alcohol use. Tobacco use. Drug use. Emotional  well-being. Home and relationship well-being. Sexual activity. Eating habits. History of falls. Memory and ability to understand (cognition). Work and work Statistician. Reproductive health. Screening  You may have the following tests or measurements: Height, weight, and BMI. Blood pressure. Lipid and cholesterol levels. These may be checked every 5 years, or more frequently if you are over 47 years old. Skin check. Lung cancer screening. You may have this screening every year starting at age 48 if you have a 30-pack-year history of smoking and currently smoke or have quit within the past 15 years. Fecal occult blood test (FOBT) of the stool. You may have this test every year starting at age 50. Flexible sigmoidoscopy or colonoscopy. You may have a sigmoidoscopy every 5 years or a colonoscopy every 10 years starting at age 43. Hepatitis C blood test. Hepatitis B blood test. Sexually transmitted disease (STD) testing. Diabetes screening. This is done by checking your blood sugar (glucose) after you have not eaten for a while (fasting). You may have this done every 1-3 years. Bone density scan. This is done to screen for osteoporosis. You may have this done starting at age 18. Mammogram. This may be done every 1-2 years. Talk to your health care provider about how often you should have regular mammograms. Talk with your health care provider about your test results, treatment options, and if necessary, the need for more tests. Vaccines  Your health care provider may recommend certain vaccines, such as: Influenza vaccine. This is recommended every year. Tetanus, diphtheria, and acellular pertussis (Tdap, Td) vaccine. You may need a Td booster every 10 years. Zoster vaccine. You may need this after age 28. Pneumococcal 13-valent conjugate (PCV13) vaccine. One dose is recommended after age 67. Pneumococcal polysaccharide (  PPSV23) vaccine. One dose is recommended after age 49. Talk to your  health care provider about which screenings and vaccines you need and how often you need them. This information is not intended to replace advice given to you by your health care provider. Make sure you discuss any questions you have with your health care provider. Document Released: 01/19/2015 Document Revised: 09/12/2015 Document Reviewed: 10/24/2014 Elsevier Interactive Patient Education  2017 [employer] Prevention in the Home Falls can cause injuries. They can happen to people of all ages. There are many things you can do to make your home safe and to help prevent falls. What can I do on the outside of my home? Regularly fix the edges of walkways and driveways and fix any cracks. Remove anything that might make you trip as you walk through a door, such as a raised step or threshold. Trim any bushes or trees on the path to your home. Use bright outdoor lighting. Clear any walking paths of anything that might make someone trip, such as rocks or tools. Regularly check to see if handrails are loose or broken. Make sure that both sides of any steps have handrails. Any raised decks and porches should have guardrails on the edges. Have any leaves, snow, or ice cleared regularly. Use sand or salt on walking paths during winter. Clean up any spills in your garage right away. This includes oil or grease spills. What can I do in the bathroom? Use night lights. Install grab bars by the toilet and in the tub and shower. Do not use towel bars as grab bars. Use non-skid mats or decals in the tub or shower. If you need to sit down in the shower, use a plastic, non-slip stool. Keep the floor dry. Clean up any water that spills on the floor as soon as it happens. Remove soap buildup in the tub or shower regularly. Attach bath mats securely with double-sided non-slip rug tape. Do not have throw rugs and other things on the floor that can make you trip. What can I do in the bedroom? Use night  lights. Make sure that you have a light by your bed that is easy to reach. Do not use any sheets or blankets that are too big for your bed. They should not hang down onto the floor. Have a firm chair that has side arms. You can use this for support while you get dressed. Do not have throw rugs and other things on the floor that can make you trip. What can I do in the kitchen? Clean up any spills right away. Avoid walking on wet floors. Keep items that you use a lot in easy-to-reach places. If you need to reach something above you, use a strong step stool that has a grab bar. Keep electrical cords out of the way. Do not use floor polish or wax that makes floors slippery. If you must use wax, use non-skid floor wax. Do not have throw rugs and other things on the floor that can make you trip. What can I do with my stairs? Do not leave any items on the stairs. Make sure that there are handrails on both sides of the stairs and use them. Fix handrails that are broken or loose. Make sure that handrails are as long as the stairways. Check any carpeting to make sure that it is firmly attached to the stairs. Fix any carpet that is loose or worn. Avoid having throw rugs at the top or  bottom of the stairs. If you do have throw rugs, attach them to the floor with carpet tape. Make sure that you have a light switch at the top of the stairs and the bottom of the stairs. If you do not have them, ask someone to add them for you. What else can I do to help prevent falls? Wear shoes that: Do not have high heels. Have rubber bottoms. Are comfortable and fit you well. Are closed at the toe. Do not wear sandals. If you use a stepladder: Make sure that it is fully opened. Do not climb a closed stepladder. Make sure that both sides of the stepladder are locked into place. Ask someone to hold it for you, if possible. Clearly mark and make sure that you can see: Any grab bars or handrails. First and last  steps. Where the edge of each step is. Use tools that help you move around (mobility aids) if they are needed. These include: Canes. Walkers. Scooters. Crutches. Turn on the lights when you go into a dark area. Replace any light bulbs as soon as they burn out. Set up your furniture so you have a clear path. Avoid moving your furniture around. If any of your floors are uneven, fix them. If there are any pets around you, be aware of where they are. Review your medicines with your doctor. Some medicines can make you feel dizzy. This can increase your chance of falling. Ask your doctor what other things that you can do to help prevent falls. This information is not intended to replace advice given to you by your health care provider. Make sure you discuss any questions you have with your health care provider. Document Released: 10/19/2008 Document Revised: 05/31/2015 Document Reviewed: 01/27/2014 Elsevier Interactive Patient Education  2017 [employer].

## 2021-02-19 NOTE — Telephone Encounter (Signed)
[name], maselli are scheduled for a virtual visit with your provider today.    Just as we do with appointments in the office, we must obtain your consent to participate.  Your consent will be active for this visit and any virtual visit you may have with one of our providers in the next 365 days.    If you have a MyChart account, I can also send a copy of this consent to you electronically.  All virtual visits are billed to your insurance company just like a traditional visit in the office.  As this is a virtual visit, video technology does not allow for your provider to perform a traditional examination.  This may limit your provider's ability to fully assess your condition.  If your provider identifies any concerns that need to be evaluated in person or the need to arrange testing such as labs, EKG, etc, we will make arrangements to do so.    Although advances in technology are sophisticated, we cannot ensure that it will always work on either your end or our end.  If the connection with a video visit is poor, we may have to switch to a telephone visit.  With either a video or telephone visit, we are not always able to ensure that we have a secure connection.   I need to obtain your verbal consent now.   Are you willing to proceed with your visit today?   Laurie Gray has provided verbal consent on 02/19/2021 for a virtual visit (video or telephone).   [name], [facility] 02/19/2021  3:07 PM

## 2021-02-19 NOTE — Progress Notes (Signed)
Subjective:   Laurie Gray is a 66 y.o. female who presents for Medicare Annual (Subsequent) preventive examination.  Review of Systems     Cardiac Risk Factors include: advanced age (>54men, >7 women);hypertension;dyslipidemia     Objective:    Today's Vitals   02/19/21 1520  PainSc: 5    There is no height or weight on file to calculate BMI.  Advanced Directives 02/19/2021 01/25/2021 03/14/2020 02/06/2020 11/02/2019 06/29/2019 02/28/2019  Does Patient Have a Medical Advance Directive? No No No No No No No  Would patient like information on creating a medical advance directive? - Yes (MAU/Ambulatory/Procedural Areas - Information given) Yes (MAU/Ambulatory/Procedural Areas - Information given) Yes (MAU/Ambulatory/Procedural Areas - Information given) Yes (MAU/Ambulatory/Procedural Areas - Information given) No - Patient declined Yes (MAU/Ambulatory/Procedural Areas - Information given)    Current Medications (verified) Outpatient Encounter Medications as of 02/19/2021  Medication Sig   levothyroxine (SYNTHROID) 75 MCG tablet Take 1 tablet (75 mcg total) by mouth daily before breakfast.   Multiple Vitamins-Minerals (MULTIVITAMIN ADULTS 50+ PO) Take 1 tablet by mouth daily.   ODEFSEY 200-25-25 MG TABS tablet TAKE 1 TABLET BY MOUTH EVERY DAY WITH BREAKFAST   olmesartan-hydrochlorothiazide (BENICAR HCT) 20-12.5 MG tablet TAKE 1 TABLET BY MOUTH EVERY DAY   pravastatin (PRAVACHOL) 40 MG tablet TAKE 1 TABLET BY MOUTH EVERY DAY   traMADol (ULTRAM) 50 MG tablet TAKE 1 TABLET BY MOUTH EVERY 8 HOURS AS NEEDED   No facility-administered encounter medications on file as of 02/19/2021.    Allergies (verified) Crestor [rosuvastatin calcium], Pravastatin, and Sulfonamide derivatives   History: Past Medical History:  Diagnosis Date   Anemia    Anxiety    hx of-not on any meds   Arthritis    "shoulders, arms, fingers, neck, back" (01/31/2016)   Chronic cervical pain    Family  history of breast cancer    History of bronchitis    History of colon polyps    History of kidney stones    HIV infection ([place]) dx'd 2000   takes odefsey   Hyperlipidemia    takes Pravastatin daily   Hypertension    takes Benicar HCT daily   Hyperthyroidism    Hypothyroidism    Internal hemorrhoids    Joint pain    Migraine    "in the past" (01/31/2016)   Parathyroid adenoma    Pituitary adenoma ([place])    Refusal of blood transfusions as patient is Jehovah's Witness    "but substitutes are fine". (01/31/2016)   Thyroid cancer ([place]) 12/2015   Upper back pain    Past Surgical History:  Procedure Laterality Date   ABDOMINAL HYSTERECTOMY     BREAST EXCISIONAL BIOPSY Left    no scar seen    BUNIONECTOMY Right    COLONOSCOPY     CYSTOSCOPY     HAMMER TOE SURGERY Left    "middle toe was broke; turned into hammertoe then repaired"   HAMMER TOE SURGERY Bilateral    "both small toes"   PARATHYROIDECTOMY Right 01/31/2016   PARATHYROIDECTOMY Right 01/31/2016   Procedure: PARATHYROIDECTOMY ON RIGHT SIDE;  Surgeon: Izora Gala, MD;  Location: [place];  Service: ENT;  Laterality: Right;   SALPINGOOPHORECTOMY Bilateral    THYROIDECTOMY N/A 01/31/2016   Procedure: TOTAL THYROIDECTOMY;  Surgeon: Izora Gala, MD;  Location: [facility];  Service: ENT;  Laterality: N/A;   TOE SURGERY     bunion   TONSILLECTOMY     as a child   TOTAL THYROIDECTOMY  01/31/2016   TUBAL LIGATION     15 yrs ago   Family History  Problem Relation Age of Onset   Breast cancer Mother 29   Hypertension Mother    Cancer Mother    Hyperlipidemia Mother    Stroke Mother    Other Father        'vocal dysautonomia'   Breast cancer Sister 57   Thyroid disease Sister    Osteoarthritis Maternal Grandmother    Colon cancer Neg Hx    Social History   Socioeconomic History   Marital status: Widowed    Spouse name: Not on file   Number of children: Not on file   Years of education: Not on file   Highest education  level: Not on file  Occupational History   Not on file  Tobacco Use   Smoking status: Never   Smokeless tobacco: Never  Vaping Use   Vaping Use: Never used  Substance and Sexual Activity   Alcohol use: Not Currently    Alcohol/week: 0.0 standard drinks    Comment: 01/31/2016 "might go out to eat and have a drink once/month; no more than that"   Drug use: No   Sexual activity: Not Currently    Partners: Male    Comment: declined condoms  Other Topics Concern   Not on file  Social History Narrative   Normal diet   Drinks Caffeine- coffee/coke/chocolate    Lives in an apartment/ third level/1 person/1 pet   Past profession- Museum/gallery exhibitions officer, Theatre manager   Exercises 3-4 x weekly (cardio and [employer])       Social Determinants of Health   Financial Resource Strain: Not on file  Food Insecurity: Not on file  Transportation Needs: Not on file  Physical Activity: Not on file  Stress: Not on file  Social Connections: Not on file    Tobacco Counseling Counseling given: Not Answered   Clinical Intake:  Pre-visit preparation completed: Yes  Pain : 0-10 Pain Score: 5  Pain Type: Chronic pain Pain Location: Head Pain Orientation: Right, Left     BMI - recorded: 21.99 Nutritional Status: BMI of 19-24  Normal  How often do you need to have someone help you when you read instructions, pamphlets, or other written materials from your doctor or pharmacy?: 1 - Never  Diabetic?no         Activities of Daily Living In your present state of health, do you have any difficulty performing the following activities: 02/19/2021  Hearing? N  Vision? Y  Difficulty concentrating or making decisions? N  Walking or climbing stairs? Y  Comment difficulty climbing stairs  Dressing or bathing? N  Doing errands, shopping? N  Preparing Food and eating ? N  Using the Toilet? N  In the past six months, have you accidently leaked urine? N  Do you have problems with loss of bowel control?  N  Managing your Medications? N  Managing your Finances? N  Housekeeping or managing your Housekeeping? N  Some recent data might be hidden    Patient Care Team: Lauree Chandler, NP as PCP - General (Geriatric Medicine) Campbell Riches, MD as PCP - Infectious Diseases (Infectious Diseases) Jackolyn Confer, MD as Consulting Physician (General Surgery)  Indicate any recent Medical Services you may have received from other than [facility] providers in the past year (date may be approximate).     Assessment:   This is a routine wellness examination for Buena.  Hearing/Vision screen Hearing Screening - Comments::  Patient has no hearing problems Vision Screening - Comments:: Patient can't see clearly out of left eye. Patient's last eye exam was within past year. Patient forgot doctor's name.  Dietary issues and exercise activities discussed: Current Exercise Habits: Home exercise routine, Type of exercise: calisthenics;walking;Other - see comments, Time (Minutes): 20, Frequency (Times/Week): 3, Weekly Exercise (Minutes/Week): 60   Goals Addressed   None    Depression Screen PHQ 2/9 Scores 02/19/2021 01/25/2021 05/10/2020 02/06/2020 08/09/2019 06/29/2019 02/02/2019  PHQ - 2 Score 0 0 0 0 1 0 0  PHQ- 9 Score - - - - - - -    Fall Risk Fall Risk  02/19/2021 01/25/2021 05/10/2020 02/06/2020 11/02/2019  Falls in the past year? 0 0 0 0 0  Number falls in past yr: 0 0 - 0 0  Injury with Fall? 0 0 - 0 0  Risk for fall due to : No Fall Risks No Fall Risks - - -  Follow up Falls evaluation completed Falls evaluation completed Falls evaluation completed - -    FALL RISK PREVENTION PERTAINING TO THE HOME:  Any stairs in or around the home? Yes  If so, are there any without handrails? No  Home free of loose throw rugs in walkways, pet beds, electrical cords, etc? Yes  Adequate lighting in your home to reduce risk of falls? Yes   ASSISTIVE DEVICES UTILIZED TO PREVENT FALLS:  Life alert? No  Use  of a cane, walker or w/c? No  Grab bars in the bathroom? Yes  Shower chair or bench in shower? Yes  Elevated toilet seat or a handicapped toilet? No   TIMED UP AND GO:  Was the test performed? No .   Cognitive Function: MMSE - Mini Mental State Exam 01/28/2018 01/21/2016  Orientation to time 5 5  Orientation to Place 5 5  Registration 3 3  Attention/ Calculation 5 5  Recall 3 3  Language- name 2 objects 2 2  Language- repeat 1 1  Language- follow 3 step command 3 3  Language- read & follow direction 1 1  Write a sentence 1 1  Copy design 1 1  Total score 30 30     6CIT Screen 02/19/2021 02/06/2020 02/02/2019  What Year? 0 points 0 points 0 points  What month? 0 points 0 points 0 points  What time? 0 points 0 points 0 points  Count back from 20 0 points 0 points 0 points  Months in reverse 0 points 0 points 0 points  Repeat phrase 0 points 0 points 2 points  Total Score 0 0 2    Immunizations Immunization History  Administered Date(s) Administered   Fluad Quad(high Dose 65+) 03/14/2020   Hepatitis B, adult 06/09/2013, 07/11/2013, 12/05/2013   Influenza Split 09/08/2010, 11/10/2011   Influenza Whole 12/25/2008, 09/24/2009   Influenza, High Dose Seasonal PF 09/04/2020   Influenza,inj,Quad PF,6+ Mos 10/11/2012, 11/02/2013, 11/08/2014, 10/22/2015, 09/24/2016, 10/13/2018   Influenza-Unspecified 10/15/2017   PFIZER(Purple Top)SARS-COV-2 Vaccination 03/12/2019, 04/15/2019, 10/30/2019   Pneumococcal Conjugate-13 03/28/2014   Pneumococcal Polysaccharide-23 01/24/2010   Tdap 06/22/2020   Zoster Recombinat (Shingrix) 03/11/2018, 11/11/2018    TDAP status: Up to date  Flu Vaccine status: Up to date  Pneumococcal vaccine status: Due, Education has been provided regarding the importance of this vaccine. Advised may receive this vaccine at local pharmacy or Health Dept. Aware to provide a copy of the vaccination record if obtained from local pharmacy or Health Dept. Verbalized  acceptance and understanding.  Covid-19 vaccine  status: Information provided on how to obtain vaccines.   Qualifies for Shingles Vaccine? Yes   Zostavax completed No   Shingrix Completed?: Yes  Screening Tests Health Maintenance  Topic Date Due   COVID-19 Vaccine (4 - Booster for Pfizer series) 12/25/2019   Pneumonia Vaccine 19+ Years old (3) 10/01/2020   DEXA SCAN  10/28/2020   MAMMOGRAM  07/20/2022   COLONOSCOPY (Pts 45-79yrs Insurance coverage will need to be confirmed)  01/09/2025   TETANUS/TDAP  06/23/2030   INFLUENZA VACCINE  Completed   Hepatitis C Screening  Completed   HIV Screening  Completed   Zoster Vaccines- Shingrix  Completed   HPV VACCINES  Aged Out    Health Maintenance  Health Maintenance Due  Topic Date Due   COVID-19 Vaccine (4 - Booster for Pfizer series) 12/25/2019   Pneumonia Vaccine 62+ Years old (57) 10/01/2020   DEXA SCAN  10/28/2020    Colorectal cancer screening: Type of screening: Colonoscopy. Completed 2017. Repeat every 10 years  Mammogram status: Completed 07/19/20. Repeat every year  Bone Density status: Ordered and scheduled. Pt provided with contact info and advised to call to schedule appt.  Lung Cancer Screening: (Low Dose CT Chest recommended if Age 12-80 years, 30 pack-year currently smoking OR have quit w/in 15years.) does not qualify.   Lung Cancer Screening Referral: na  Additional Screening:  Hepatitis C Screening: does qualify; Completed 2015   Vision Screening: Recommended annual ophthalmology exams for early detection of glaucoma and other disorders of the eye. Is the patient up to date with their annual eye exam?  Yes  Who is the provider or what is the name of the office in which the patient attends annual eye exams? unknown If pt is not established with a provider, would they like to be referred to a provider to establish care? No .   Dental Screening: Recommended annual dental exams for proper oral  hygiene  Community Resource Referral / Chronic Care Management: CRR required this visit?  No   CCM required this visit?  No      Plan:     I have personally reviewed and noted the following in the patients chart:   Medical and social history Use of alcohol, tobacco or illicit drugs  Current medications and supplements including opioid prescriptions.  Functional ability and status Nutritional status Physical activity Advanced directives List of other physicians Hospitalizations, surgeries, and ER visits in previous 12 months Vitals Screenings to include cognitive, depression, and falls Referrals and appointments  In addition, I have reviewed and discussed with patient certain preventive protocols, quality metrics, and best practice recommendations. A written personalized care plan for preventive services as well as general preventive health recommendations were provided to patient.     Lauree Chandler, NP   02/19/2021    Virtual Visit via Telephone Note  I connected with patient 02/19/21 at  3:15 PM EST by telephone and verified that I am speaking with the correct person using two identifiers.  Location: Patient: home Provider: [facility]    I discussed the limitations, risks, security and privacy concerns of performing an evaluation and management service by telephone and the availability of in person appointments. I also discussed with the patient that there may be a patient responsible charge related to this service. The patient expressed understanding and agreed to proceed.   I discussed the assessment and treatment plan with the patient. The patient was provided an opportunity to ask questions and all were answered.  The patient agreed with the plan and demonstrated an understanding of the instructions.   The patient was advised to call back or seek an in-person evaluation if the symptoms worsen or if the condition fails to improve as anticipated.  I provided 15  minutes of non-face-to-face time during this encounter.  [employer]. Harle Battiest [employer] printed and mailed

## 2021-02-19 NOTE — Progress Notes (Signed)
This service is provided via telemedicine  No vital signs collected/recorded due to the encounter was a telemedicine visit.   Location of patient (ex: home, work):  Home  Patient consents to a telephone visit:  Yes, see encounter dated 02/19/2021  Location of the provider (ex: office, home):  N/A  Name of any referring provider:  N/A  Names of all persons participating in the telemedicine service and their role in the encounter:  [name], Nurse Practitioner, [name], CMA, and patient.   Time spent on call:  10 minutes with medical assistant

## 2021-03-05 ENCOUNTER — Other Ambulatory Visit: Payer: Self-pay | Admitting: Nurse Practitioner

## 2021-03-05 ENCOUNTER — Other Ambulatory Visit: Payer: Self-pay | Admitting: Infectious Diseases

## 2021-03-05 DIAGNOSIS — M720 Palmar fascial fibromatosis [Dupuytren]: Secondary | ICD-10-CM

## 2021-03-05 DIAGNOSIS — B20 Human immunodeficiency virus [HIV] disease: Secondary | ICD-10-CM

## 2021-03-05 DIAGNOSIS — M2550 Pain in unspecified joint: Secondary | ICD-10-CM

## 2021-03-05 NOTE — Telephone Encounter (Signed)
Patient has request refill on medication "Tramadol". Patient last refill on medication dated 01/31/2021. Patient has Non Opioid Contract on file dated 04/09/2020. Patient has upcoming appointment 03/13/2021. "UPDATE CONTRACT" added to patient appointment note. Medication pend and sent to PCP Lauree Chandler, NP.

## 2021-03-13 ENCOUNTER — Ambulatory Visit: Payer: Medicare Other | Admitting: Nurse Practitioner

## 2021-03-28 ENCOUNTER — Ambulatory Visit: Payer: Medicare Other | Admitting: Infectious Diseases

## 2021-03-31 ENCOUNTER — Other Ambulatory Visit: Payer: Self-pay | Admitting: Nurse Practitioner

## 2021-03-31 ENCOUNTER — Other Ambulatory Visit: Payer: Self-pay | Admitting: Infectious Diseases

## 2021-03-31 DIAGNOSIS — B20 Human immunodeficiency virus [HIV] disease: Secondary | ICD-10-CM

## 2021-03-31 DIAGNOSIS — M720 Palmar fascial fibromatosis [Dupuytren]: Secondary | ICD-10-CM

## 2021-03-31 DIAGNOSIS — M2550 Pain in unspecified joint: Secondary | ICD-10-CM

## 2021-04-01 NOTE — Telephone Encounter (Signed)
Patient scheduled on 4/6 ?

## 2021-04-01 NOTE — Telephone Encounter (Signed)
Patient has request refill on medications "Pravastatin", and "Tramadol". Patient medication Prav last refilled 02/11/2021, and Tram 03/05/2021. Patient has Non Opioid Contract signed 04/09/2020. Patient next appointment 04/02/2021. "UPDATE CONTRACT". Medication Pravastatin has Allergy Contraindications. Medications pend and sent to PCP Dewaine Oats Carlos American, NP  ?

## 2021-04-02 ENCOUNTER — Ambulatory Visit: Payer: Medicare Other | Admitting: Nurse Practitioner

## 2021-04-02 ENCOUNTER — Ambulatory Visit (INDEPENDENT_AMBULATORY_CARE_PROVIDER_SITE_OTHER): Payer: Medicare Other | Admitting: Family

## 2021-04-02 ENCOUNTER — Encounter: Payer: Self-pay | Admitting: Family

## 2021-04-02 ENCOUNTER — Other Ambulatory Visit: Payer: Self-pay

## 2021-04-02 VITALS — BP 100/80 | HR 70 | Temp 97.4°F | Resp 16 | Ht 65.5 in | Wt 134.4 lb

## 2021-04-02 DIAGNOSIS — M2550 Pain in unspecified joint: Secondary | ICD-10-CM | POA: Diagnosis not present

## 2021-04-02 DIAGNOSIS — M25511 Pain in right shoulder: Secondary | ICD-10-CM | POA: Diagnosis not present

## 2021-04-02 DIAGNOSIS — R52 Pain, unspecified: Secondary | ICD-10-CM | POA: Diagnosis not present

## 2021-04-02 DIAGNOSIS — R0602 Shortness of breath: Secondary | ICD-10-CM | POA: Diagnosis not present

## 2021-04-02 DIAGNOSIS — R519 Headache, unspecified: Secondary | ICD-10-CM

## 2021-04-02 DIAGNOSIS — R0989 Other specified symptoms and signs involving the circulatory and respiratory systems: Secondary | ICD-10-CM | POA: Diagnosis not present

## 2021-04-02 DIAGNOSIS — M159 Polyosteoarthritis, unspecified: Secondary | ICD-10-CM | POA: Diagnosis not present

## 2021-04-02 DIAGNOSIS — M25512 Pain in left shoulder: Secondary | ICD-10-CM | POA: Diagnosis not present

## 2021-04-02 DIAGNOSIS — J029 Acute pharyngitis, unspecified: Secondary | ICD-10-CM

## 2021-04-02 DIAGNOSIS — R5383 Other fatigue: Secondary | ICD-10-CM

## 2021-04-02 DIAGNOSIS — G8929 Other chronic pain: Secondary | ICD-10-CM

## 2021-04-02 MED ORDER — SUMATRIPTAN SUCCINATE 25 MG PO TABS: 25.0000 mg | ORAL_TABLET | ORAL | 0 refills | Status: DC | PRN

## 2021-04-02 NOTE — Progress Notes (Signed)
? ?Provider: Marlowe Sax FNP-C ? ?Lauree Chandler, NP ? ?Patient Care Team: ?Lauree Chandler, NP as PCP - General (Geriatric Medicine) ?Campbell Riches, MD as PCP - Infectious Diseases (Infectious Diseases) ?Jackolyn Confer, MD as Consulting Physician (General Surgery) ? ?Extended Emergency Contact Information ?Primary Emergency Contact: Talkington,Theodore ?Address: [address]         [place], [place] [address] [place] ?Mobile Phone: [phone] ?Relation: Son ?Secondary Emergency Contact: Simpson,Cassandra ? [place] ?Mobile Phone: [phone] ?Relation: Sister ? ?Code Status: Full Code  ?Goals of care: Advanced Directive information ? ?  04/02/2021  ?  3:12 PM  ?Advanced Directives  ?Does Patient Have a Medical Advance Directive? No  ?Would patient like information on creating a medical advance directive? No - Patient declined  ? ? ? ?Chief Complaint  ?Patient presents with  ? Acute Visit  ?  Patient complains of fatigue, SOB, and headaches. Patient experienced Sore throat, Chest Congestion, and Body aches at the beginning of March 2023.  ? ? ?HPI:  ?Pt is a 66 y.o. female seen today for an acute visit for evaluation of fatigue ,Headache,shortness of breath,congestion and generalized body aches.has a significant history of hypertension, hypothyroidism, pituitary adenoma, migraines, deceive capsulitis of both shoulders, osteoarthritis, osteopenia, Dupuytren's contracture of both hands, HIV infection among other conditions. ? ?Denies exposure to person with COVID-19 infection.  ?States takes tramadol for chronic pain but when pain medication wears off she gets a headache that usually improves when she takes tramadol.  Headaches have been more frequent.  Has a follow-up appointment in June, 2023 with endocrinologist for follow-up pituitary adenoma. She thinks that this could be also causing her headaches behind left eye. ?She is also concerned of her ongoing chronic  arthritic pain on the shoulders and worsening on the fingers.  States unable to make a fist since fingers cannot close. ? ? ?Past Medical History:  ?Diagnosis Date  ? Anemia   ? Anxiety   ? hx of-not on any meds  ? Arthritis   ? "shoulders, arms, fingers, neck, back" (01/31/2016)  ? Chronic cervical pain   ? Family history of breast cancer   ? History of bronchitis   ? History of colon polyps   ? History of kidney stones   ? HIV infection ([place]) dx'd 2000  ? takes odefsey  ? Hyperlipidemia   ? takes Pravastatin daily  ? Hypertension   ? takes Benicar HCT daily  ? Hyperthyroidism   ? Hypothyroidism   ? Internal hemorrhoids   ? Joint pain   ? Migraine   ? "in the past" (01/31/2016)  ? Parathyroid adenoma   ? Pituitary adenoma ([place])   ? Refusal of blood transfusions as patient is Jehovah's Witness   ? "but substitutes are fine". (01/31/2016)  ? Thyroid cancer ([place]) 12/2015  ? Upper back pain   ? ?Past Surgical History:  ?Procedure Laterality Date  ? ABDOMINAL HYSTERECTOMY    ? BREAST EXCISIONAL BIOPSY Left   ? no scar seen   ? BUNIONECTOMY Right   ? COLONOSCOPY    ? CYSTOSCOPY    ? HAMMER TOE SURGERY Left   ? "middle toe was broke; turned into hammertoe then repaired"  ? HAMMER TOE SURGERY Bilateral   ? "both small toes"  ? PARATHYROIDECTOMY Right 01/31/2016  ? PARATHYROIDECTOMY Right 01/31/2016  ? Procedure: PARATHYROIDECTOMY ON RIGHT SIDE;  Surgeon: Izora Gala, MD;  Location: [facility];  Service: ENT;  Laterality: Right;  ? SALPINGOOPHORECTOMY Bilateral   ? THYROIDECTOMY N/A 01/31/2016  ? Procedure: TOTAL THYROIDECTOMY;  Surgeon: Izora Gala, MD;  Location: [facility];  Service: ENT;  Laterality: N/A;  ? TOE SURGERY    ? bunion  ? TONSILLECTOMY    ? as a child  ? TOTAL THYROIDECTOMY  01/31/2016  ? TUBAL LIGATION    ? 30 yrs ago  ? ? ?Allergies  ?Allergen Reactions  ? Crestor [Rosuvastatin Calcium]   ?  myalgias  ? Pravastatin   ?  myalgias  ? Sulfonamide Derivatives Swelling  ?  SWELLING REACTION UNSPECIFIED   ? ? ?Outpatient  Encounter Medications as of 04/02/2021  ?Medication Sig  ? levothyroxine (SYNTHROID) 75 MCG tablet Take 1 tablet (75 mcg total) by mouth daily before breakfast.  ? Multiple Vitamins-Minerals (MULTIVITAMIN ADULTS 50+ PO) Take 1 tablet by mouth daily.  ? ODEFSEY 200-25-25 MG TABS tablet TAKE 1 TABLET BY MOUTH EVERY DAY WITH BREAKFAST  ? olmesartan-hydrochlorothiazide (BENICAR HCT) 20-12.5 MG tablet TAKE 1 TABLET BY MOUTH EVERY DAY  ? pravastatin (PRAVACHOL) 40 MG tablet TAKE 1 TABLET BY MOUTH EVERY DAY  ? traMADol (ULTRAM) 50 MG tablet TAKE 1 TABLET BY MOUTH EVERY 8 HOURS AS NEEDED  ? ?No facility-administered encounter medications on file as of 04/02/2021.  ? ? ?Review of Systems  ?Constitutional:  Positive for fatigue. Negative for appetite change, chills, fever and unexpected weight change.  ?     Generalized body aches  ?HENT:  Positive for sore throat. Negative for congestion, dental problem, ear discharge, ear pain, facial swelling, hearing loss, nosebleeds, postnasal drip, rhinorrhea, sinus pressure, sinus pain, sneezing, tinnitus and trouble swallowing.   ?     Loss of sense of smell and taste  ?Eyes:  Negative for pain, discharge, redness, itching and visual disturbance.  ?Respiratory:  Negative for cough, chest tightness and wheezing.   ?     Reports shortness of breath but none today.  Chest congestion  ?Cardiovascular:  Negative for chest pain, palpitations and leg swelling.  ?Gastrointestinal:  Negative for abdominal distention, abdominal pain, diarrhea, nausea and vomiting.  ?Musculoskeletal:  Positive for arthralgias and joint swelling. Negative for back pain, gait problem, myalgias, neck pain and neck stiffness.  ?     Chronic worsening pain on bilateral shoulders knees and fingers f  ?Skin:  Negative for color change, pallor, rash and wound.  ?Neurological:  Positive for headaches. Negative for dizziness, syncope, speech difficulty, weakness, light-headedness and numbness.  ?Hematological:  Does not  bruise/bleed easily.  ? ?Immunization History  ?Administered Date(s) Administered  ? Fluad Quad(high Dose 65+) 03/14/2020  ? Hepatitis B, adult 06/09/2013, 07/11/2013, 12/05/2013  ? Influenza Split 09/08/2010, 11/10/2011  ? Influenza Whole 12/25/2008, 09/24/2009  ? Influenza, High Dose Seasonal PF 09/04/2020  ? Influenza,inj,Quad PF,6+ Mos 10/11/2012, 11/02/2013, 11/08/2014, 10/22/2015, 09/24/2016, 10/13/2018  ? Influenza-Unspecified 10/15/2017  ? PFIZER(Purple Top)SARS-COV-2 Vaccination 03/12/2019, 04/15/2019, 10/30/2019  ? Pneumococcal Conjugate-13 03/28/2014  ? Pneumococcal Polysaccharide-23 01/24/2010  ? Tdap 06/22/2020  ? Zoster Recombinat (Shingrix) 03/11/2018, 11/11/2018  ? ?Pertinent  Health Maintenance Due  ?Topic Date Due  ? DEXA SCAN  10/28/2020  ? MAMMOGRAM  07/20/2022  ? COLONOSCOPY (Pts 45-54yr Insurance coverage will need to be confirmed)  01/09/2025  ? INFLUENZA VACCINE  Completed  ? ? ?  02/06/2020  ?  3:21 PM 05/10/2020  ?  2:01 PM 01/25/2021  ?  1:10 PM 02/19/2021  ?  3:00 PM 04/02/2021  ?  3:12 PM  ?  Fall Risk  ?Falls in the past year? 0 0 0 0 0  ?Was there an injury with Fall? 0  0 0 0  ?Fall Risk Category Calculator 0  0 0 0  ?Fall Risk Category Low  Low Low Low  ?Patient Fall Risk Level Low fall risk Low fall risk Low fall risk Low fall risk Low fall risk  ?Patient at Risk for Falls Due to   No Fall Risks No Fall Risks No Fall Risks  ?Fall risk Follow up  Falls evaluation completed Falls evaluation completed Falls evaluation completed Falls evaluation completed  ? ?Functional Status Survey: ?  ? ?Vitals:  ? 04/02/21 1521  ?BP: 100/80  ?Pulse: 70  ?Resp: 16  ?Temp: (!) 97.4 ?F (36.3 ?C)  ?SpO2: 98%  ?Weight: 134 lb 6.4 oz (61 kg)  ?Height: 5' 5.5" (1.664 m)  ? ?Body mass index is 22.03 kg/m?Marland Kitchen ?Physical Exam ? ?Labs reviewed: ?Recent Labs  ?  04/24/20 ?1428 09/04/20 ?1425 01/25/21 ?1334  ?NA 142  --  143  ?K 4.1  --  4.0  ?CL 103  --  102  ?CO2 33*  --  35*  ?GLUCOSE 98  --  67  ?BUN 15  --  13   ?CREATININE 1.05*  --  0.98  ?CALCIUM 9.9 9.9 10.0  ? ?Recent Labs  ?  04/24/20 ?1428 01/25/21 ?1334  ?AST 29 20  ?ALT 27 15  ?BILITOT 0.3 0.4  ?PROT 6.5 6.9  ? ?Recent Labs  ?  04/24/20 ?1428 01/25/21 ?1334  ?WBC 3.

## 2021-04-03 LAB — SARS-COV-2 RNA,(COVID-19) QUALITATIVE NAAT: SARS CoV2 RNA: NOT DETECTED

## 2021-04-11 ENCOUNTER — Other Ambulatory Visit: Payer: Self-pay

## 2021-04-11 ENCOUNTER — Ambulatory Visit (INDEPENDENT_AMBULATORY_CARE_PROVIDER_SITE_OTHER): Payer: Medicare Other | Admitting: Infectious Diseases

## 2021-04-11 ENCOUNTER — Encounter: Payer: Self-pay | Admitting: Infectious Diseases

## 2021-04-11 ENCOUNTER — Ambulatory Visit: Payer: Medicare Other

## 2021-04-11 VITALS — BP 130/78 | HR 75 | Temp 98.6°F | Wt 130.0 lb

## 2021-04-11 DIAGNOSIS — Z23 Encounter for immunization: Secondary | ICD-10-CM | POA: Diagnosis not present

## 2021-04-11 DIAGNOSIS — R519 Headache, unspecified: Secondary | ICD-10-CM

## 2021-04-11 DIAGNOSIS — I10 Essential (primary) hypertension: Secondary | ICD-10-CM

## 2021-04-11 DIAGNOSIS — E890 Postprocedural hypothyroidism: Secondary | ICD-10-CM

## 2021-04-11 DIAGNOSIS — K0889 Other specified disorders of teeth and supporting structures: Secondary | ICD-10-CM

## 2021-04-11 DIAGNOSIS — Z79899 Other long term (current) drug therapy: Secondary | ICD-10-CM

## 2021-04-11 DIAGNOSIS — Z113 Encounter for screening for infections with a predominantly sexual mode of transmission: Secondary | ICD-10-CM | POA: Diagnosis not present

## 2021-04-11 DIAGNOSIS — B20 Human immunodeficiency virus [HIV] disease: Secondary | ICD-10-CM

## 2021-04-11 NOTE — Assessment & Plan Note (Addendum)
Well controlled, will check her Cr today.  ?

## 2021-04-11 NOTE — Assessment & Plan Note (Signed)
Appreciate her endo f/u.  ?

## 2021-04-11 NOTE — Progress Notes (Addendum)
? ?Subjective:  ? ? Patient ID: Laurie Gray, female  DOB: [birth year], 66 y.o.        MRN: [id number] ? ? ?HPI ?66 yo F with hx of dx 2000 HIV+, depression, hyperlipidemia, prev lap hysterecomy-BSO (2009). 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  ?Admits to a few missed doses of odefsy.  ?Had previous abn on colpo (SIL). Had hysterectomy many years ago (~ 25) then ovaries 15 yrs ago.  ?She had mammo 02-2017 which showed R cysts. Since normal mammos.  ?  ?Had colon 2013. Had stool test (-) x 2 2019. Last colon 2017.  ?  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  ?She also got radiocative Iodine ?Has had Endo f/u in the last 6 months.  ?  ?Also hx of dupteryn's contractures- she has been seen by hand surgery: ? ?Has been headaches, daily. Her GP put her on Imitrex. Also on daily daily tramadol, for her arthritis.  ?She is going to see a headaches specialist next week. Describes them as debilitating. Worries it is from the tramadol.   ?Also has dental pain. Had Access Dental root canal (many years ago) and had cap placed, and still has pain in her L maxillary 2nd incisor. Has bleeding in her mouth in the AM. Occas has L eye pain as well.  ?She was then seen by [facility] practice and had root canal revised, bridge. Pain unchanged.  ? ?HIV 1 RNA Quant  ?Date Value  ?04/24/2020 Not Detected Copies/mL  ?07/25/2019 <20 NOT DETECTED copies/mL  ?09/30/2018 <20 NOT DETECTED copies/mL  ? ?CD4 T Cell Abs (/uL)  ?Date Value  ?04/24/2020 644  ?07/25/2019 830  ?09/30/2018 621  ? ? ? ?Health Maintenance  ?Topic Date Due  ? COVID-19 Vaccine (4 - Booster for Pfizer series) 12/25/2019  ? Pneumonia Vaccine 41+ Years old (30) 10/01/2020  ? DEXA SCAN  10/28/2020  ? INFLUENZA VACCINE  08/06/2021  ? MAMMOGRAM  07/20/2022  ? COLONOSCOPY (Pts 45-62yr Insurance coverage will need to be confirmed)  01/09/2025  ? TETANUS/TDAP  06/23/2030  ? Hepatitis C Screening  Completed  ? HIV  Screening  Completed  ? Zoster Vaccines- Shingrix  Completed  ? HPV VACCINES  Aged Out  ? ? ? ? ?Review of Systems  ?Constitutional:  Negative for chills, fever and weight loss.  ?Eyes:  Positive for pain.  ?Respiratory:  Negative for cough and shortness of breath.   ?Gastrointestinal:  Negative for constipation and diarrhea.  ?Genitourinary:  Negative for dysuria.  ?Neurological:  Positive for headaches.  ? ?Please see HPI. All other systems reviewed and negative. ? ?   ?Objective:  ?Physical Exam ?Vitals reviewed.  ?Constitutional:   ?   Appearance: Normal appearance.  ?HENT:  ?   Mouth/Throat:  ?   Mouth: Mucous membranes are moist.  ?   Dentition: No dental tenderness.  ?   Pharynx: No oropharyngeal exudate.  ? ?   Comments: No d/c, pain, tenderness, no looseness.  ?Eyes:  ?   Extraocular Movements: Extraocular movements intact.  ?   Pupils: Pupils are equal, round, and reactive to light.  ?Cardiovascular:  ?   Rate and Rhythm: Normal rate and regular rhythm.  ?Pulmonary:  ?   Effort: Pulmonary effort is normal.  ?   Breath sounds: Normal breath sounds.  ?Abdominal:  ?   General: Bowel sounds are normal. There is no distension.  ?   Palpations: Abdomen is  soft.  ?   Tenderness: There is no abdominal tenderness.  ?Musculoskeletal:     ?   General: Normal range of motion.  ?   Cervical back: Normal range of motion and neck supple.  ?   Right lower leg: No edema.  ?   Left lower leg: No edema.  ?Neurological:  ?   General: No focal deficit present.  ?   Mental Status: She is alert.  ? ?Tooth is actually 2nd incisor, not first.  ? ? ? ?   ?Assessment & Plan:  ? ?

## 2021-04-11 NOTE — Assessment & Plan Note (Addendum)
Will check her labs today ?Will f/u with her GYN for PAP ?PCV20 today, defers COVID vax ?She is thinking about [facility].  ?Will see her back in 6 months.  ?

## 2021-04-11 NOTE — Addendum Note (Signed)
Addended by: Leatrice Jewels on: 04/11/2021 03:40 PM ? ? Modules accepted: Orders ? ?

## 2021-04-11 NOTE — Assessment & Plan Note (Signed)
Appreciate headache clinic f/u and PCP.  ?

## 2021-04-11 NOTE — Assessment & Plan Note (Signed)
Given a card with info for Dr Satira Sark.  ?

## 2021-04-12 ENCOUNTER — Encounter: Payer: Self-pay | Admitting: Infectious Diseases

## 2021-04-12 LAB — T-HELPER CELL (CD4) - (RCID CLINIC ONLY)
CD4 % Helper T Cell: 44 % (ref 33–65)
CD4 T Cell Abs: 681 /uL (ref 400–1790)

## 2021-04-13 LAB — COMPREHENSIVE METABOLIC PANEL
AG Ratio: 1.5 (calc) (ref 1.0–2.5)
ALT: 12 U/L (ref 6–29)
AST: 18 U/L (ref 10–35)
Albumin: 4.2 g/dL (ref 3.6–5.1)
Alkaline phosphatase (APISO): 55 U/L (ref 37–153)
BUN: 12 mg/dL (ref 7–25)
CO2: 30 mmol/L (ref 20–32)
Calcium: 10.2 mg/dL (ref 8.6–10.4)
Chloride: 104 mmol/L (ref 98–110)
Creat: 1.03 mg/dL (ref 0.50–1.05)
Globulin: 2.8 g/dL (calc) (ref 1.9–3.7)
Glucose, Bld: 92 mg/dL (ref 65–99)
Potassium: 3.8 mmol/L (ref 3.5–5.3)
Sodium: 143 mmol/L (ref 135–146)
Total Bilirubin: 0.5 mg/dL (ref 0.2–1.2)
Total Protein: 7 g/dL (ref 6.1–8.1)

## 2021-04-13 LAB — CBC
HCT: 36.4 % (ref 35.0–45.0)
Hemoglobin: 12.1 g/dL (ref 11.7–15.5)
MCH: 31 pg (ref 27.0–33.0)
MCHC: 33.2 g/dL (ref 32.0–36.0)
MCV: 93.3 fL (ref 80.0–100.0)
MPV: 11.9 fL (ref 7.5–12.5)
Platelets: 216 10*3/uL (ref 140–400)
RBC: 3.9 10*6/uL (ref 3.80–5.10)
RDW: 12.7 % (ref 11.0–15.0)
WBC: 4.2 10*3/uL (ref 3.8–10.8)

## 2021-04-13 LAB — RPR: RPR Ser Ql: NONREACTIVE

## 2021-04-13 LAB — HIV-1 RNA QUANT-NO REFLEX-BLD
HIV 1 RNA Quant: NOT DETECTED copies/mL
HIV-1 RNA Quant, Log: NOT DETECTED Log copies/mL

## 2021-04-16 ENCOUNTER — Encounter: Payer: Self-pay | Admitting: Infectious Diseases

## 2021-04-18 ENCOUNTER — Other Ambulatory Visit: Payer: Self-pay | Admitting: Endocrinology

## 2021-04-23 ENCOUNTER — Encounter: Payer: Self-pay | Admitting: Endocrinology

## 2021-04-23 ENCOUNTER — Ambulatory Visit (INDEPENDENT_AMBULATORY_CARE_PROVIDER_SITE_OTHER): Payer: Medicare Other | Admitting: Endocrinology

## 2021-04-23 VITALS — BP 112/70 | HR 82 | Ht 65.5 in | Wt 130.6 lb

## 2021-04-23 DIAGNOSIS — E890 Postprocedural hypothyroidism: Secondary | ICD-10-CM

## 2021-04-23 DIAGNOSIS — C73 Malignant neoplasm of thyroid gland: Secondary | ICD-10-CM

## 2021-04-23 LAB — T4, FREE: Free T4: 1.07 ng/dL (ref 0.60–1.60)

## 2021-04-23 LAB — TSH: TSH: 0.93 u[IU]/mL (ref 0.35–5.50)

## 2021-04-23 NOTE — Patient Instructions (Addendum)
Blood tests are requested for you today.  We'll let you know about the results.   ?You should have an endocrinology follow-up appointment in 1 year.   ?

## 2021-04-23 NOTE — Progress Notes (Signed)
? ?Subjective:  ? ? Patient ID: Laurie Gray, female    DOB: [birth year], 66 y.o.   MRN: [id number] ? ?HPI ?Pt returns for f/u stage 1 PTC, with this chronology: ?1/18: thyroidectomy: PTC, 0.9 CM. - FOCAL MICROSCOPIC EXTRATHYROIDAL EXTENSION.  INVOLVES MARGIN.   ?3/18: RAI rx with thyrogen, 109 mCi ?3/18: post-therapy scan: uptake in the thyroid bed only.   ?1/19: TG=0.1 (Ab neg) ?2/20: TG=0.1 (Ab neg) ?10/20: TG=0.1 (Ab neg) ?6/21  TG=0.1 (Ab neg) ?8/22  TG=0.1 (Ab neg) ?Pt also has pituitary cyst (dx'ed 2005, when MRI showed 3.5 x 6.5 mm cyst; f/u in 2020 was unchanged; pit function was normal in 2020).   ?Postop hypothyroidism: she takes synthroid as rx'ed.  pt states she feels well in general.   ?Pt also had parathyroidect in 2018.  She takes multivitamin, but not a dedicated Vit-D supplement.   ?Past Medical History:  ?Diagnosis Date  ? Anemia   ? Anxiety   ? hx of-not on any meds  ? Arthritis   ? "shoulders, arms, fingers, neck, back" (01/31/2016)  ? Chronic cervical pain   ? Family history of breast cancer   ? History of bronchitis   ? History of colon polyps   ? History of kidney stones   ? HIV infection ([place]) dx'd 2000  ? takes odefsey  ? Hyperlipidemia   ? takes Pravastatin daily  ? Hypertension   ? takes Benicar HCT daily  ? Hyperthyroidism   ? Hypothyroidism   ? Internal hemorrhoids   ? Joint pain   ? Migraine   ? "in the past" (01/31/2016)  ? Parathyroid adenoma   ? Pituitary adenoma ([place])   ? Refusal of blood transfusions as patient is Jehovah's Witness   ? "but substitutes are fine". (01/31/2016)  ? Thyroid cancer ([place]) 12/2015  ? Upper back pain   ? ? ?Past Surgical History:  ?Procedure Laterality Date  ? ABDOMINAL HYSTERECTOMY    ? BREAST EXCISIONAL BIOPSY Left   ? no scar seen   ? BUNIONECTOMY Right   ? COLONOSCOPY    ? CYSTOSCOPY    ? HAMMER TOE SURGERY Left   ? "middle toe was broke; turned into hammertoe then repaired"  ? HAMMER TOE SURGERY Bilateral   ? "both small toes"  ?  PARATHYROIDECTOMY Right 01/31/2016  ? PARATHYROIDECTOMY Right 01/31/2016  ? Procedure: PARATHYROIDECTOMY ON RIGHT SIDE;  Surgeon: Izora Gala, MD;  Location: [facility];  Service: ENT;  Laterality: Right;  ? SALPINGOOPHORECTOMY Bilateral   ? THYROIDECTOMY N/A 01/31/2016  ? Procedure: TOTAL THYROIDECTOMY;  Surgeon: Izora Gala, MD;  Location: [facility];  Service: ENT;  Laterality: N/A;  ? TOE SURGERY    ? bunion  ? TONSILLECTOMY    ? as a child  ? TOTAL THYROIDECTOMY  01/31/2016  ? TUBAL LIGATION    ? 30 yrs ago  ? ? ?Social History  ? ?Socioeconomic History  ? Marital status: Widowed  ?  Spouse name: Not on file  ? Number of children: Not on file  ? Years of education: Not on file  ? Highest education level: Not on file  ?Occupational History  ? Not on file  ?Tobacco Use  ? Smoking status: Never  ? Smokeless tobacco: Never  ?Vaping Use  ? Vaping Use: Never used  ?Substance and Sexual Activity  ? Alcohol use: Not Currently  ?  Alcohol/week: 0.0 standard drinks  ?  Comment: 01/31/2016 "might go out to eat and have a drink  once/month; no more than that"  ? Drug use: No  ? Sexual activity: Not Currently  ?  Partners: Male  ?  Comment: declined condoms  ?Other Topics Concern  ? Not on file  ?Social History Narrative  ? Normal diet  ? Drinks Caffeine- coffee/coke/chocolate   ? Lives in an apartment/ third level/1 person/1 pet  ? Past profession- Museum/gallery exhibitions officer, cosmetologist  ? Exercises 3-4 x weekly (cardio and weights)  ?    ? ?Social Determinants of Health  ? ?Financial Resource Strain: Not on file  ?Food Insecurity: Not on file  ?Transportation Needs: Not on file  ?Physical Activity: Not on file  ?Stress: Not on file  ?Social Connections: Not on file  ?Intimate Partner Violence: Not on file  ? ? ?Current Outpatient Medications on File Prior to Visit  ?Medication Sig Dispense Refill  ? levothyroxine (SYNTHROID) 75 MCG tablet TAKE 1 TABLET BY MOUTH DAILY BEFORE BREAKFAST. 90 tablet 1  ? Multiple Vitamins-Minerals (MULTIVITAMIN ADULTS  50+ PO) Take 1 tablet by mouth daily.    ? ODEFSEY 200-25-25 MG TABS tablet TAKE 1 TABLET BY MOUTH EVERY DAY WITH BREAKFAST 30 tablet 0  ? olmesartan-hydrochlorothiazide (BENICAR HCT) 20-12.5 MG tablet TAKE 1 TABLET BY MOUTH EVERY DAY 90 tablet 1  ? pravastatin (PRAVACHOL) 40 MG tablet TAKE 1 TABLET BY MOUTH EVERY DAY 90 tablet 0  ? SUMAtriptan (IMITREX) 25 MG tablet Take 1 tablet (25 mg total) by mouth every 2 (two) hours as needed for migraine. May repeat in 2 hours if headache persists or recurs. 10 tablet 0  ? traMADol (ULTRAM) 50 MG tablet TAKE 1 TABLET BY MOUTH EVERY 8 HOURS AS NEEDED 90 tablet 0  ? ?No current facility-administered medications on file prior to visit.  ? ? ?Allergies  ?Allergen Reactions  ? Crestor [Rosuvastatin Calcium]   ?  myalgias  ? Pravastatin   ?  myalgias  ? Sulfonamide Derivatives Swelling  ?  SWELLING REACTION UNSPECIFIED   ? ? ?Family History  ?Problem Relation Age of Onset  ? Breast cancer Mother 53  ? Hypertension Mother   ? Cancer Mother   ? Hyperlipidemia Mother   ? Stroke Mother   ? Other Father   ?     'vocal dysautonomia'  ? Breast cancer Sister 13  ? Thyroid disease Sister   ? Osteoarthritis Maternal Grandmother   ? Colon cancer Neg Hx   ? ? ?BP 112/70 (BP Location: Left Arm, Patient Position: Sitting, Cuff Size: Normal)   Pulse 82   Ht 5' 5.5" (1.664 m)   Wt 130 lb 9.6 oz (59.2 kg)   SpO2 97%   BMI 21.40 kg/m?  ? ? ?Review of Systems ? ?   ?Objective:  ? Physical Exam ?VITAL SIGNS:  See vs page ?GENERAL: no distress ?Neck: a healed scar is present.  I do not appreciate a nodule in the thyroid or elsewhere in the neck.   ? ? ?Lab Results  ?Component Value Date  ? TSH 0.93 04/23/2021  ? ?   ?Assessment & Plan:  ?Hypothyroidism: well-controlled.  Please continue the same synthroid ?PTC: recheck today.   ? ?

## 2021-04-24 LAB — THYROGLOBULIN LEVEL: Thyroglobulin: 0.1 ng/mL — ABNORMAL LOW

## 2021-04-24 LAB — THYROGLOBULIN ANTIBODY: Thyroglobulin Ab: <1 IU/mL (ref <=1)

## 2021-05-01 ENCOUNTER — Other Ambulatory Visit: Payer: Self-pay | Admitting: Family

## 2021-05-01 DIAGNOSIS — R519 Headache, unspecified: Secondary | ICD-10-CM

## 2021-05-01 NOTE — Telephone Encounter (Signed)
High risk warning populated when attempting to approve refill request. Will send to Lauree Chandler, NP to review.  ? ?

## 2021-05-03 ENCOUNTER — Other Ambulatory Visit: Payer: Self-pay | Admitting: Nurse Practitioner

## 2021-05-03 DIAGNOSIS — M2550 Pain in unspecified joint: Secondary | ICD-10-CM

## 2021-05-03 DIAGNOSIS — M720 Palmar fascial fibromatosis [Dupuytren]: Secondary | ICD-10-CM

## 2021-05-06 ENCOUNTER — Other Ambulatory Visit: Payer: Self-pay | Admitting: Infectious Diseases

## 2021-05-06 DIAGNOSIS — B20 Human immunodeficiency virus [HIV] disease: Secondary | ICD-10-CM

## 2021-05-13 NOTE — Progress Notes (Signed)
Referring:  Caesar Bookman, NP [address] [place],  [place] [address]  PCP: Sharon Seller, NP  Neurology was asked to evaluate Laurie Gray, a 66 year old female for a chief complaint of headaches.  Our recommendations of care will be communicated by shared medical record.    CC:  headaches  History provided from self  HPI:  Medical co-morbidities: thyroid cancer s/p thyroidectomy 2018, s/p parathyroidectomy 2018, HIV, HLD, HTN, pituitary adenoma, kidney stones, Dupuytren's contracture, spasmodic dysphonia  The patient presents for evaluation of daily headaches and facial pain which began following a root canal on a left front tooth 3-4 years ago. Has a history of migraines when she was a young adult. They had improved for several years until her root canal. After the procedure she developed a dull ache over her top gums on the left. This then progressed to left frontal throbbing headaches. Currently she has daily headaches. They are worst when her tooth pain is worse. Headaches are associated with photophobia, phonophobia, and nausea. They can last 2 days at a time.  She occasionally has bleeding around where she had the root canal. She is seeing a gum specialist later this week.   She currently takes Tramadol three times a day for arthritis and headaches. She has Imitrex prescribed for headaches, but tries to avoid taking it because it makes her feel very fatigued.  Reports that she hit her head on the left occiput very hard ~2 years ago. Felt dizzy afterwards. Never got evaluated for a concussion.  Headache History: Onset: 3-4 years ago Triggers: tooth pain Aura: blurry Location: left forehead, retro-orbital, face Quality/Description: dull, throbbing, pressure Associated Symptoms:  Photophobia: yes  Phonophobia: yes  Nausea: yes Vomiting: no Worse with activity?: yes Duration of headaches: 2 days  Headache days per month: 30 Headache free days per month:  0  Current Treatment: Abortive Tramadol  Preventative none  Prior Therapies                                 Imitrex 25 mg PRN Cymbalta 30 mg daily - lack of efficacy Tramadol Naproxen  LABS: 04/23/21 TSH wnl  CBC    Component Value Date/Time   WBC 4.2 04/11/2021 1557   RBC 3.90 04/11/2021 1557   HGB 12.1 04/11/2021 1557   HGB 11.1 03/15/2015 1414   HCT 36.4 04/11/2021 1557   HCT 34.6 03/15/2015 1414   PLT 216 04/11/2021 1557   PLT 211 03/15/2015 1414   MCV 93.3 04/11/2021 1557   MCV 92 03/15/2015 1414   MCH 31.0 04/11/2021 1557   MCHC 33.2 04/11/2021 1557   RDW 12.7 04/11/2021 1557   RDW 13.9 03/15/2015 1414   LYMPHSABS 2,156 01/25/2021 1334   LYMPHSABS 1.2 03/15/2015 1414   MONOABS 296 11/19/2015 1516   EOSABS 79 01/25/2021 1334   EOSABS 0.0 03/15/2015 1414   BASOSABS 31 01/25/2021 1334   BASOSABS 0.0 03/15/2015 1414      Latest Ref Rng & Units 04/11/2021    3:57 PM 01/25/2021    1:34 PM 09/04/2020    2:25 PM  CMP  Glucose 65 - 99 mg/dL 92   67     BUN 7 - 25 mg/dL 12   13     Creatinine 0.50 - 1.05 mg/dL 4.69   6.29     Sodium 135 - 146 mmol/L 143   143  Potassium 3.5 - 5.3 mmol/L 3.8   4.0     Chloride 98 - 110 mmol/L 104   102     CO2 20 - 32 mmol/L 30   35     Calcium 8.6 - 10.4 mg/dL 40.9   81.1   9.9    Total Protein 6.1 - 8.1 g/dL 7.0   6.9     Total Bilirubin 0.2 - 1.2 mg/dL 0.5   0.4     AST 10 - 35 U/L 18   20     ALT 6 - 29 U/L 12   15       IMAGING:  none  Current Outpatient Medications on File Prior to Visit  Medication Sig Dispense Refill   levothyroxine (SYNTHROID) 75 MCG tablet TAKE 1 TABLET BY MOUTH DAILY BEFORE BREAKFAST. 90 tablet 1   Multiple Vitamins-Minerals (MULTIVITAMIN ADULTS 50+ PO) Take 1 tablet by mouth daily.     ODEFSEY 200-25-25 MG TABS tablet TAKE 1 TABLET BY MOUTH EVERY DAY WITH BREAKFAST 30 tablet 4   olmesartan-hydrochlorothiazide (BENICAR HCT) 20-12.5 MG tablet TAKE 1 TABLET BY MOUTH EVERY DAY 90 tablet 1    pravastatin (PRAVACHOL) 40 MG tablet TAKE 1 TABLET BY MOUTH EVERY DAY 90 tablet 0   SUMAtriptan (IMITREX) 25 MG tablet Take 1 tablet (25 mg total) by mouth daily. May repeat in 2 hours if headache persists or recurs. 10 tablet 0   traMADol (ULTRAM) 50 MG tablet TAKE 1 TABLET BY MOUTH EVERY 8 HOURS AS NEEDED 90 tablet 0   No current facility-administered medications on file prior to visit.     Allergies: Allergies  Allergen Reactions   Crestor [Rosuvastatin Calcium]     myalgias   Pravastatin     myalgias   Sulfonamide Derivatives Swelling    SWELLING REACTION UNSPECIFIED     Family History: Migraine or other headaches in the family:  mother Aneurysms in a first degree relative:  none Brain tumors in the family:  none Other neurological illness in the family:   none  Past Medical History: Past Medical History:  Diagnosis Date   Anemia    Anxiety    hx of-not on any meds   Arthritis    "shoulders, arms, fingers, neck, back" (01/31/2016)   Chronic cervical pain    Family history of breast cancer    History of bronchitis    History of colon polyps    History of kidney stones    HIV infection (HCC) dx'd 2000   takes odefsey   Hyperlipidemia    takes Pravastatin daily   Hypertension    takes Benicar HCT daily   Hyperthyroidism    Hypothyroidism    Internal hemorrhoids    Joint pain    Migraine    "in the past" (01/31/2016)   Parathyroid adenoma    Pituitary adenoma (HCC)    Refusal of blood transfusions as patient is Jehovah's Witness    "but substitutes are fine". (01/31/2016)   Thyroid cancer (HCC) 12/2015   Upper back pain     Past Surgical History Past Surgical History:  Procedure Laterality Date   ABDOMINAL HYSTERECTOMY     BREAST EXCISIONAL BIOPSY Left    no scar seen    BUNIONECTOMY Right    COLONOSCOPY     CYSTOSCOPY     HAMMER TOE SURGERY Left    "middle toe was broke; turned into hammertoe then repaired"   HAMMER TOE SURGERY Bilateral    "both  small toes"   PARATHYROIDECTOMY Right 01/31/2016   PARATHYROIDECTOMY Right 01/31/2016   Procedure: PARATHYROIDECTOMY ON RIGHT SIDE;  Surgeon: Serena Colonel, MD;  Location: [facility];  Service: ENT;  Laterality: Right;   SALPINGOOPHORECTOMY Bilateral    THYROIDECTOMY N/A 01/31/2016   Procedure: TOTAL THYROIDECTOMY;  Surgeon: Serena Colonel, MD;  Location: [facility];  Service: ENT;  Laterality: N/A;   TOE SURGERY     bunion   TONSILLECTOMY     as a child   TOTAL THYROIDECTOMY  01/31/2016   TUBAL LIGATION     30 yrs ago    Social History: Social History   Tobacco Use   Smoking status: Never   Smokeless tobacco: Never  Vaping Use   Vaping Use: Never used  Substance Use Topics   Alcohol use: Not Currently    Alcohol/week: 0.0 standard drinks    Comment: 01/31/2016 "might go out to eat and have a drink once/month; no more than that"   Drug use: No    ROS: Negative for fevers, chills. Positive for headaches. All other systems reviewed and negative unless stated otherwise in HPI.   Physical Exam:   Vital Signs: BP 122/62   Pulse 65   Ht 5\' 5"  (1.651 m)   Wt 133 lb (60.3 kg)   SpO2 97%   BMI 22.13 kg/m  GENERAL: well appearing,in no acute distress,alert SKIN:  Color, texture, turgor normal. No rashes or lesions HEAD:  Normocephalic/atraumatic. CV:  RRR RESP: Normal respiratory effort MSK: no tenderness to palpation over occiput, neck, or shoulders. Right hand is contracted (baseline)  NEUROLOGICAL: Mental Status: Alert, oriented to person, place and time,Follows commands Cranial Nerves: PERRL, visual fields intact to confrontation, extraocular movements intact, diminished sensation over left V1-3, symmetric smile but favors right side of her mouth when speaking, hearing grossly intact, no dysarthria but voice is strained 2/2 spasmodic dysphonia Motor: muscle strength 5/5 both upper and lower extremities Reflexes: 2+ throughout Sensation: diminished sensation to light touch  LUE Coordination: Finger-to- nose-finger intact bilaterally Gait: normal-based   IMPRESSION: 66 year old female with a history of thyroid cancer s/p thyroidectomy 2018, s/p parathyroidectomy 2018, HIV, HLD, HTN, pituitary adenoma, kidney stones, Dupuytren's contracture, spasmodic dysphonia who presents for evaluation of daily headaches and facial pain following a root canal 3-4 years ago. Will order MRI brain as her exam is significant for decreased sensation on the left face and upper extremity. Her headaches are consistent with chronic migraine. Suspect she has an additional element of medication overuse headache in the setting of daily tramadol use. Will switch Imitrex to Maxalt as this may be less likely to cause side effects. She would like to wait until her MRI results before starting preventive medication.   PLAN: -MRI brain -Stop Imitrex. Start Maxalt 10 mg PRN -Counseled on medication overuse headache -Next steps: consider amitriptyline, gabapentin for headaches and facial pain   I spent a total of 52 minutes chart reviewing and counseling the patient. Headache education was done. Discussed treatment options including preventive and acute medications. Discussed medication overuse headache and to limit use of acute treatments to no more than 2 days/week or 10 days/month. Discussed medication side effects, adverse reactions and drug interactions. Written educational materials and patient instructions outlining all of the above were given.  Follow-up: 4 months   Ocie Doyne, MD 05/14/2021   3:55 PM

## 2021-05-14 ENCOUNTER — Ambulatory Visit (INDEPENDENT_AMBULATORY_CARE_PROVIDER_SITE_OTHER): Payer: Medicare Other | Admitting: Psychiatry

## 2021-05-14 ENCOUNTER — Encounter: Payer: Self-pay | Admitting: Psychiatry

## 2021-05-14 VITALS — BP 122/62 | HR 65 | Ht 65.0 in | Wt 133.0 lb

## 2021-05-14 DIAGNOSIS — R519 Headache, unspecified: Secondary | ICD-10-CM

## 2021-05-14 DIAGNOSIS — G43009 Migraine without aura, not intractable, without status migrainosus: Secondary | ICD-10-CM

## 2021-05-14 MED ORDER — RIZATRIPTAN BENZOATE 10 MG PO TABS: 10.0000 mg | ORAL_TABLET | ORAL | 3 refills | Status: DC | PRN

## 2021-05-14 NOTE — Patient Instructions (Signed)
Start Maxalt 10 mg as needed for migraine. Take this in place of Imitrex. Take at the onset of migraine. If headache recurs or does not fully resolve, you may take a second dose after 2 hours. Please avoid taking more than 2 days per week or 10 days per month. ? ?

## 2021-05-15 ENCOUNTER — Telehealth: Payer: Self-pay | Admitting: Psychiatry

## 2021-05-15 NOTE — Telephone Encounter (Signed)
[employer] medicare order sent to [facility], [facility] they will reach out to the patient to schedule.  

## 2021-05-29 ENCOUNTER — Ambulatory Visit
Admission: RE | Admit: 2021-05-29 | Discharge: 2021-05-29 | Disposition: A | Discharge status: Home / Self Care | Payer: Medicare Other | Source: Ambulatory Visit | Attending: Psychiatry | Admitting: Psychiatry

## 2021-05-29 DIAGNOSIS — R519 Headache, unspecified: Secondary | ICD-10-CM | POA: Diagnosis not present

## 2021-05-29 MED ORDER — GADOBENATE DIMEGLUMINE 529 MG/ML IV SOLN
12.0000 mL | Freq: Once | INTRAVENOUS | Status: AC | PRN
  Administered 2021-05-29: 12 mL via INTRAVENOUS

## 2021-05-31 ENCOUNTER — Other Ambulatory Visit: Payer: Self-pay | Admitting: Nurse Practitioner

## 2021-05-31 DIAGNOSIS — R519 Headache, unspecified: Secondary | ICD-10-CM

## 2021-06-02 ENCOUNTER — Other Ambulatory Visit: Payer: Self-pay | Admitting: Nurse Practitioner

## 2021-06-02 DIAGNOSIS — M720 Palmar fascial fibromatosis [Dupuytren]: Secondary | ICD-10-CM

## 2021-06-02 DIAGNOSIS — M2550 Pain in unspecified joint: Secondary | ICD-10-CM

## 2021-06-26 ENCOUNTER — Encounter: Payer: Self-pay | Admitting: Infectious Diseases

## 2021-06-30 ENCOUNTER — Other Ambulatory Visit: Payer: Self-pay | Admitting: Nurse Practitioner

## 2021-06-30 DIAGNOSIS — M2550 Pain in unspecified joint: Secondary | ICD-10-CM

## 2021-06-30 DIAGNOSIS — M720 Palmar fascial fibromatosis [Dupuytren]: Secondary | ICD-10-CM

## 2021-07-02 ENCOUNTER — Other Ambulatory Visit: Payer: Self-pay | Admitting: Nurse Practitioner

## 2021-07-02 DIAGNOSIS — R519 Headache, unspecified: Secondary | ICD-10-CM

## 2021-07-20 ENCOUNTER — Other Ambulatory Visit: Payer: Self-pay | Admitting: Family

## 2021-07-20 DIAGNOSIS — G8929 Other chronic pain: Secondary | ICD-10-CM

## 2021-07-20 DIAGNOSIS — F419 Anxiety disorder, unspecified: Secondary | ICD-10-CM

## 2021-07-23 ENCOUNTER — Ambulatory Visit
Admission: RE | Admit: 2021-07-23 | Discharge: 2021-07-23 | Disposition: A | Discharge status: Home / Self Care | Payer: Medicare Other | Source: Ambulatory Visit | Attending: Nurse Practitioner | Admitting: Nurse Practitioner

## 2021-07-23 ENCOUNTER — Other Ambulatory Visit: Payer: Self-pay

## 2021-07-23 DIAGNOSIS — M810 Age-related osteoporosis without current pathological fracture: Secondary | ICD-10-CM | POA: Diagnosis not present

## 2021-07-23 DIAGNOSIS — M85851 Other specified disorders of bone density and structure, right thigh: Secondary | ICD-10-CM | POA: Diagnosis not present

## 2021-07-23 DIAGNOSIS — Z1231 Encounter for screening mammogram for malignant neoplasm of breast: Secondary | ICD-10-CM

## 2021-07-23 DIAGNOSIS — Z780 Asymptomatic menopausal state: Secondary | ICD-10-CM | POA: Diagnosis not present

## 2021-07-23 DIAGNOSIS — E2839 Other primary ovarian failure: Secondary | ICD-10-CM

## 2021-08-02 ENCOUNTER — Ambulatory Visit (INDEPENDENT_AMBULATORY_CARE_PROVIDER_SITE_OTHER): Payer: Medicare Other | Admitting: Nurse Practitioner

## 2021-08-02 ENCOUNTER — Encounter: Payer: Self-pay | Admitting: Nurse Practitioner

## 2021-08-02 VITALS — BP 128/92 | HR 74 | Temp 95.8°F | Ht 65.0 in | Wt 133.2 lb

## 2021-08-02 DIAGNOSIS — F331 Major depressive disorder, recurrent, moderate: Secondary | ICD-10-CM | POA: Diagnosis not present

## 2021-08-02 DIAGNOSIS — M816 Localized osteoporosis [Lequesne]: Secondary | ICD-10-CM | POA: Diagnosis not present

## 2021-08-02 DIAGNOSIS — E785 Hyperlipidemia, unspecified: Secondary | ICD-10-CM

## 2021-08-02 DIAGNOSIS — G43709 Chronic migraine without aura, not intractable, without status migrainosus: Secondary | ICD-10-CM | POA: Diagnosis not present

## 2021-08-02 DIAGNOSIS — J383 Other diseases of vocal cords: Secondary | ICD-10-CM

## 2021-08-02 DIAGNOSIS — E890 Postprocedural hypothyroidism: Secondary | ICD-10-CM

## 2021-08-02 DIAGNOSIS — I10 Essential (primary) hypertension: Secondary | ICD-10-CM

## 2021-08-02 DIAGNOSIS — M720 Palmar fascial fibromatosis [Dupuytren]: Secondary | ICD-10-CM

## 2021-08-02 MED ORDER — CALCIUM CARBONATE 600 MG PO TABS: 600.0000 mg | ORAL_TABLET | Freq: Two times a day (BID) | ORAL | 1 refills | Status: AC

## 2021-08-02 MED ORDER — VITAMIN D3 50 MCG (2000 UT) PO CAPS: 2000.0000 [IU] | ORAL_CAPSULE | Freq: Every day | ORAL | 1 refills | Status: AC

## 2021-08-02 NOTE — Progress Notes (Signed)
Careteam: Patient Care Team: Lauree Chandler, NP as PCP - General (Geriatric Medicine) Campbell Riches, MD as PCP - Infectious Diseases (Infectious Diseases) Jackolyn Confer, MD as Consulting Physician (General Surgery)  PLACE OF SERVICE:  [place] Directive information Does Patient Have a Medical Advance Directive?: No, Would patient like information on creating a medical advance directive?: No - Patient declined  Allergies  Allergen Reactions   Crestor [Rosuvastatin Calcium]     myalgias   Pravastatin     myalgias   Sulfonamide Derivatives Swelling    SWELLING REACTION UNSPECIFIED     Chief Complaint  Patient presents with   Medical Management of Chronic Issues    6 month follow-up. Patient has no concerns. Discuss need for covid booster or post pone if patient refuses, [facility] verified. Patient is not ready for another covid vaccine.      HPI: Patient is a 67 y.o. female for routine follow up.   Osteoporosis noted on her dexa scan. She does not want to start another medication but will do cal and vit d and weight bearing exercises.   Headaches are not as frequent.   She is off cymbalta and doing well. No recurrent anxiety or depression.   No longer watching her grandson and she is more sedentary.   No acute concerns at this time.   Tramadol helps control pain due to dupuytrens   Review of Systems:  Review of Systems  Constitutional:  Negative for chills, fever and weight loss.  HENT:  Negative for tinnitus.   Respiratory:  Negative for cough, sputum production and shortness of breath.   Cardiovascular:  Negative for chest pain, palpitations and leg swelling.  Gastrointestinal:  Negative for abdominal pain, constipation, diarrhea and heartburn.  Genitourinary:  Negative for dysuria, frequency and urgency.  Musculoskeletal:  Negative for back pain, falls, joint pain and myalgias.  Skin: Negative.   Neurological:  Negative for dizziness and  headaches.  Psychiatric/Behavioral:  Negative for depression and memory loss. The patient does not have insomnia.     Past Medical History:  Diagnosis Date   Anemia    Anxiety    hx of-not on any meds   Arthritis    "shoulders, arms, fingers, neck, back" (01/31/2016)   Chronic cervical pain    Family history of breast cancer    History of bronchitis    History of colon polyps    History of kidney stones    HIV infection ([place]) dx'd 2000   takes odefsey   Hyperlipidemia    takes Pravastatin daily   Hypertension    takes Benicar HCT daily   Hyperthyroidism    Hypothyroidism    Internal hemorrhoids    Joint pain    Migraine    "in the past" (01/31/2016)   Parathyroid adenoma    Pituitary adenoma ([place])    Refusal of blood transfusions as patient is Jehovah's Witness    "but substitutes are fine". (01/31/2016)   Thyroid cancer ([facility]) 12/2015   Upper back pain    Past Surgical History:  Procedure Laterality Date   ABDOMINAL HYSTERECTOMY     BREAST EXCISIONAL BIOPSY Left    no scar seen    BUNIONECTOMY Right    COLONOSCOPY     CYSTOSCOPY     HAMMER TOE SURGERY Left    "middle toe was broke; turned into hammertoe then repaired"   HAMMER TOE SURGERY Bilateral    "both small toes"   PARATHYROIDECTOMY  Right 01/31/2016   PARATHYROIDECTOMY Right 01/31/2016   Procedure: PARATHYROIDECTOMY ON RIGHT SIDE;  Surgeon: Izora Gala, MD;  Location: [facility];  Service: ENT;  Laterality: Right;   SALPINGOOPHORECTOMY Bilateral    THYROIDECTOMY N/A 01/31/2016   Procedure: TOTAL THYROIDECTOMY;  Surgeon: Izora Gala, MD;  Location: [facility];  Service: ENT;  Laterality: N/A;   TOE SURGERY     bunion   TONSILLECTOMY     as a child   TOTAL THYROIDECTOMY  01/31/2016   TUBAL LIGATION     30 yrs ago   Social History:   reports that she has never smoked. She has never used smokeless tobacco. She reports that she does not currently use alcohol. She reports that she does not use drugs.  Family History   Problem Relation Age of Onset   Breast cancer Mother 65   Hypertension Mother    Cancer Mother    Hyperlipidemia Mother    Stroke Mother    Other Father        'vocal dysautonomia'   Breast cancer Sister 33   Thyroid disease Sister    Osteoarthritis Maternal Grandmother    Colon cancer Neg Hx     Medications: Patient's Medications  New Prescriptions   No medications on file  Previous Medications   CALCIUM CARBONATE (OS-CAL) 600 MG TABS TABLET    Take 600 mg by mouth 2 (two) times daily.   CHOLECALCIFEROL (VITAMIN D3) 50 MCG (2000 UT) CAPSULE    Take 2,000 Units by mouth daily.   LEVOTHYROXINE (SYNTHROID) 75 MCG TABLET    TAKE 1 TABLET BY MOUTH DAILY BEFORE BREAKFAST.   MULTIPLE VITAMINS-MINERALS (MULTIVITAMIN ADULTS 50+ PO)    Take 1 tablet by mouth daily.   ODEFSEY 200-25-25 MG TABS TABLET    TAKE 1 TABLET BY MOUTH EVERY DAY WITH BREAKFAST   OLMESARTAN-HYDROCHLOROTHIAZIDE (BENICAR HCT) 20-12.5 MG TABLET    TAKE 1 TABLET BY MOUTH EVERY DAY   PRAVASTATIN (PRAVACHOL) 40 MG TABLET    TAKE 1 TABLET BY MOUTH EVERY DAY   RIZATRIPTAN (MAXALT) 10 MG TABLET    Take 1 tablet (10 mg total) by mouth as needed for migraine. May repeat in 2 hours if needed   SUMATRIPTAN (IMITREX) 25 MG TABLET    TAKE 1 TABLET (25 MG TOTAL) BY MOUTH DAILY. MAY REPEAT IN 2 HOURS IF HEADACHE PERSISTS OR RECURS.   TRAMADOL (ULTRAM) 50 MG TABLET    Take 1 tablet (50 mg total) by mouth every 8 (eight) hours as needed.  Modified Medications   No medications on file  Discontinued Medications   No medications on file    Physical Exam:  Vitals:   08/02/21 1124  Weight: 133 lb 3.2 oz (60.4 kg)  Height: '5\' 5"'$  (1.651 m)   Body mass index is 22.17 kg/m. Wt Readings from Last 3 Encounters:  08/02/21 133 lb 3.2 oz (60.4 kg)  05/14/21 133 lb (60.3 kg)  04/23/21 130 lb 9.6 oz (59.2 kg)    Physical Exam Constitutional:      General: She is not in acute distress.    Appearance: She is well-developed. She is not  diaphoretic.  HENT:     Head: Normocephalic and atraumatic.     Mouth/Throat:     Pharynx: No oropharyngeal exudate.  Eyes:     Conjunctiva/sclera: Conjunctivae normal.     Pupils: Pupils are equal, round, and reactive to light.  Cardiovascular:     Rate and Rhythm: Normal rate and regular  rhythm.     Heart sounds: Normal heart sounds.  Pulmonary:     Effort: Pulmonary effort is normal.     Breath sounds: Normal breath sounds.  Abdominal:     General: Bowel sounds are normal.     Palpations: Abdomen is soft.  Musculoskeletal:     Cervical back: Normal range of motion and neck supple.     Right lower leg: No edema.     Left lower leg: No edema.  Skin:    General: Skin is warm and dry.  Neurological:     Mental Status: She is alert.  Psychiatric:        Mood and Affect: Mood normal.    Labs reviewed: Basic Metabolic Panel: Recent Labs    09/04/20 1425 01/25/21 1334 04/11/21 1557 04/23/21 1433  NA  --  143 143  --   K  --  4.0 3.8  --   CL  --  102 104  --   CO2  --  35* 30  --   GLUCOSE  --  67 92  --   BUN  --  13 12  --   CREATININE  --  0.98 1.03  --   CALCIUM 9.9 10.0 10.2  --   TSH 1.92  --   --  0.93   Liver Function Tests: Recent Labs    01/25/21 1334 04/11/21 1557  AST 20 18  ALT 15 12  BILITOT 0.4 0.5  PROT 6.9 7.0   No results for input(s): "LIPASE", "AMYLASE" in the last 8760 hours. No results for input(s): "AMMONIA" in the last 8760 hours. CBC: Recent Labs    01/25/21 1334 04/11/21 1557  WBC 4.4 4.2  NEUTROABS 1,795  --   HGB 13.1 12.1  HCT 38.6 36.4  MCV 92.8 93.3  PLT 198 216   Lipid Panel: Recent Labs    01/25/21 1334  CHOL 222*  HDL 72  LDLCALC 118*  TRIG 197*  CHOLHDL 3.1   TSH: Recent Labs    09/04/20 1425 04/23/21 1433  TSH 1.92 0.93   A1C: Lab Results  Component Value Date   HGBA1C 5.9 (H) 03/07/2020     Assessment/Plan 1. Moderate episode of recurrent major depressive disorder (HCC) -mood is stable,  she is off all medications.  2. Dupuytren's contracture of both hands -ongoing, stable, continues on tramadol that helps manage pain.   3. Postoperative hypothyroidism Tsh at goal, continues on synthroid.   4. Spastic dysphonia -ongoing and stable  5. Hyperlipidemia, unspecified hyperlipidemia type -LDL slightly elevated on last labs, continues on Pravachol with dietary modifications.   6. Essential hypertension, benign -Blood pressure well controlled, goal bp <140/90 Continue current medications and dietary modifications follow metabolic panel  7. Chronic migraine without aura without status migrainosus, not intractable -stable at this time.   8. Localized osteoporosis without current pathological fracture -does not wish to start medication at this time for osteoporosis but plans on working on weight bearing exercises with cal and vit d - calcium carbonate (OS-CAL) 600 MG TABS tablet; Take 1 tablet (600 mg total) by mouth 2 (two) times daily.  Dispense: 180 tablet; Refill: 1 - Cholecalciferol (VITAMIN D3) 50 MCG (2000 UT) capsule; Take 1 capsule (2,000 Units total) by mouth daily.  Dispense: 90 capsule; Refill: 1   Return in about 6 months (around 02/02/2022) for routine follow up . [facility] [employer]. [place], [facility] [phone]

## 2021-08-29 ENCOUNTER — Other Ambulatory Visit: Payer: Self-pay | Admitting: Nurse Practitioner

## 2021-08-29 DIAGNOSIS — M2550 Pain in unspecified joint: Secondary | ICD-10-CM

## 2021-08-29 DIAGNOSIS — M720 Palmar fascial fibromatosis [Dupuytren]: Secondary | ICD-10-CM

## 2021-08-29 NOTE — Telephone Encounter (Signed)
Rx was last refilled 07/01/21. Treatment agreement signed 03/2021.

## 2021-09-10 ENCOUNTER — Ambulatory Visit: Payer: Medicare Other | Admitting: Endocrinology

## 2021-09-12 ENCOUNTER — Encounter: Payer: Self-pay | Admitting: Nurse Practitioner

## 2021-09-13 ENCOUNTER — Encounter: Payer: Self-pay | Admitting: Nurse Practitioner

## 2021-09-13 ENCOUNTER — Encounter (HOSPITAL_COMMUNITY): Payer: Self-pay

## 2021-09-13 ENCOUNTER — Emergency Department (HOSPITAL_COMMUNITY)
Admission: EM | Admit: 2021-09-13 | Discharge: 2021-09-13 | Disposition: A | Discharge status: Home / Self Care | Payer: Medicare Other | Attending: Emergency Medicine | Admitting: Emergency Medicine

## 2021-09-13 ENCOUNTER — Ambulatory Visit (INDEPENDENT_AMBULATORY_CARE_PROVIDER_SITE_OTHER): Payer: Medicare Other | Admitting: Nurse Practitioner

## 2021-09-13 ENCOUNTER — Other Ambulatory Visit: Payer: Self-pay | Admitting: Nurse Practitioner

## 2021-09-13 VITALS — BP 116/84 | HR 74 | Temp 97.3°F | Resp 16 | Ht 65.0 in | Wt 131.6 lb

## 2021-09-13 DIAGNOSIS — H5711 Ocular pain, right eye: Secondary | ICD-10-CM

## 2021-09-13 DIAGNOSIS — S0501XA Injury of conjunctiva and corneal abrasion without foreign body, right eye, initial encounter: Secondary | ICD-10-CM | POA: Insufficient documentation

## 2021-09-13 DIAGNOSIS — X58XXXA Exposure to other specified factors, initial encounter: Secondary | ICD-10-CM | POA: Insufficient documentation

## 2021-09-13 DIAGNOSIS — Z23 Encounter for immunization: Secondary | ICD-10-CM

## 2021-09-13 MED ORDER — ERYTHROMYCIN 5 MG/GM OP OINT: TOPICAL_OINTMENT | OPHTHALMIC | 0 refills | Status: DC

## 2021-09-13 MED ORDER — TETRACAINE HCL 0.5 % OP SOLN
2.0000 [drp] | Freq: Once | OPHTHALMIC | Status: AC
  Administered 2021-09-13: 2 [drp] via OPHTHALMIC
  Filled 2021-09-13: qty 4

## 2021-09-13 MED ORDER — FLUORESCEIN SODIUM 1 MG OP STRP
1.0000 | ORAL_STRIP | Freq: Once | OPHTHALMIC | Status: AC
  Administered 2021-09-13: 1 via OPHTHALMIC
  Filled 2021-09-13: qty 1

## 2021-09-13 NOTE — Progress Notes (Signed)
Careteam: Patient Care Team: Lauree Chandler, NP as PCP - General (Geriatric Medicine) Campbell Riches, MD as PCP - Infectious Diseases (Infectious Diseases) Jackolyn Confer, MD as Consulting Physician (General Surgery)  PLACE OF SERVICE:  [facility] Directive information Does Patient Have a Medical Advance Directive?: No, Would patient like information on creating a medical advance directive?: No - Patient declined  Allergies  Allergen Reactions   Crestor [Rosuvastatin Calcium]     myalgias   Pravastatin     myalgias   Sulfonamide Derivatives Swelling    SWELLING REACTION UNSPECIFIED     Chief Complaint  Patient presents with   Acute Visit    Patient complains of grandchild scratching her in right eye.     HPI: Patient is a 66 y.o. female due to scratch in eye Yesterday grandson scratched eye.  He was climbing on her back and grabbed her face as he felt like he was falling She said it was very painful over night and could not sleep. Tearing, clear drainage continuously.  Eye is now red No changes in vision but hard to drive/see due to light sensitivity.    Review of Systems:  Review of Systems  Constitutional:  Negative for chills and fever.  Eyes:  Positive for photophobia, pain, discharge and redness. Negative for blurred vision and double vision.    Past Medical History:  Diagnosis Date   Anemia    Anxiety    hx of-not on any meds   Arthritis    "shoulders, arms, fingers, neck, back" (01/31/2016)   Chronic cervical pain    Family history of breast cancer    History of bronchitis    History of colon polyps    History of kidney stones    HIV infection ([place]) dx'd 2000   takes odefsey   Hyperlipidemia    takes Pravastatin daily   Hypertension    takes Benicar HCT daily   Hyperthyroidism    Hypothyroidism    Internal hemorrhoids    Joint pain    Migraine    "in the past" (01/31/2016)   Parathyroid adenoma    Pituitary adenoma  ([place])    Refusal of blood transfusions as patient is Jehovah's Witness    "but substitutes are fine". (01/31/2016)   Thyroid cancer ([place]) 12/2015   Upper back pain    Past Surgical History:  Procedure Laterality Date   ABDOMINAL HYSTERECTOMY     BREAST EXCISIONAL BIOPSY Left    no scar seen    BUNIONECTOMY Right    COLONOSCOPY     CYSTOSCOPY     HAMMER TOE SURGERY Left    "middle toe was broke; turned into hammertoe then repaired"   HAMMER TOE SURGERY Bilateral    "both small toes"   PARATHYROIDECTOMY Right 01/31/2016   PARATHYROIDECTOMY Right 01/31/2016   Procedure: PARATHYROIDECTOMY ON RIGHT SIDE;  Surgeon: Izora Gala, MD;  Location: [facility] OR;  Service: ENT;  Laterality: Right;   SALPINGOOPHORECTOMY Bilateral    THYROIDECTOMY N/A 01/31/2016   Procedure: TOTAL THYROIDECTOMY;  Surgeon: Izora Gala, MD;  Location: [facility];  Service: ENT;  Laterality: N/A;   TOE SURGERY     bunion   TONSILLECTOMY     as a child   TOTAL THYROIDECTOMY  01/31/2016   TUBAL LIGATION     30 yrs ago   Social History:   reports that she has never smoked. She has never used smokeless tobacco. She reports that she does not  currently use alcohol. She reports that she does not use drugs.  Family History  Problem Relation Age of Onset   Breast cancer Mother 18   Hypertension Mother    Cancer Mother    Hyperlipidemia Mother    Stroke Mother    Other Father        'vocal dysautonomia'   Breast cancer Sister 30   Thyroid disease Sister    Osteoarthritis Maternal Grandmother    Colon cancer Neg Hx     Medications: Patient's Medications  New Prescriptions   No medications on file  Previous Medications   CALCIUM CARBONATE (OS-CAL) 600 MG TABS TABLET    Take 1 tablet (600 mg total) by mouth 2 (two) times daily.   CHOLECALCIFEROL (VITAMIN D3) 50 MCG (2000 UT) CAPSULE    Take 1 capsule (2,000 Units total) by mouth daily.   LEVOTHYROXINE (SYNTHROID) 75 MCG TABLET    TAKE 1 TABLET BY MOUTH DAILY BEFORE  BREAKFAST.   MULTIPLE VITAMINS-MINERALS (MULTIVITAMIN ADULTS 50+ PO)    Take 1 tablet by mouth daily.   ODEFSEY 200-25-25 MG TABS TABLET    TAKE 1 TABLET BY MOUTH EVERY DAY WITH BREAKFAST   OLMESARTAN-HYDROCHLOROTHIAZIDE (BENICAR HCT) 20-12.5 MG TABLET    TAKE 1 TABLET BY MOUTH EVERY DAY   PRAVASTATIN (PRAVACHOL) 40 MG TABLET    TAKE 1 TABLET BY MOUTH EVERY DAY   RIZATRIPTAN (MAXALT) 10 MG TABLET    Take 1 tablet (10 mg total) by mouth as needed for migraine. May repeat in 2 hours if needed   SUMATRIPTAN (IMITREX) 25 MG TABLET    TAKE 1 TABLET (25 MG TOTAL) BY MOUTH DAILY. MAY REPEAT IN 2 HOURS IF HEADACHE PERSISTS OR RECURS.   TRAMADOL (ULTRAM) 50 MG TABLET    TAKE 1 TABLET BY MOUTH EVERY 8 HOURS AS NEEDED.  Modified Medications   No medications on file  Discontinued Medications   No medications on file    Physical Exam:  Vitals:   09/13/21 1325  BP: 116/84  Pulse: 74  Resp: 16  Temp: (!) 97.3 F (36.3 C)  SpO2: 98%  Weight: 131 lb 9.6 oz (59.7 kg)  Height: '5\' 5"'$  (1.651 m)   Body mass index is 21.9 kg/m. Wt Readings from Last 3 Encounters:  09/13/21 131 lb 9.6 oz (59.7 kg)  08/02/21 133 lb 3.2 oz (60.4 kg)  05/14/21 133 lb (60.3 kg)    Physical Exam Eyes:     General: Lids are normal.        Right eye: Discharge present.     Conjunctiva/sclera:     Right eye: Right conjunctiva is injected.     Pupils: Pupils are equal, round, and reactive to light.  Neurological:     Mental Status: She is alert.     Labs reviewed: Basic Metabolic Panel: Recent Labs    01/25/21 1334 04/11/21 1557 04/23/21 1433  NA 143 143  --   K 4.0 3.8  --   CL 102 104  --   CO2 35* 30  --   GLUCOSE 67 92  --   BUN 13 12  --   CREATININE 0.98 1.03  --   CALCIUM 10.0 10.2  --   TSH  --   --  0.93   Liver Function Tests: Recent Labs    01/25/21 1334 04/11/21 1557  AST 20 18  ALT 15 12  BILITOT 0.4 0.5  PROT 6.9 7.0   No results for input(s): "LIPASE", "  AMYLASE" in the last  8760 hours. No results for input(s): "AMMONIA" in the last 8760 hours. CBC: Recent Labs    01/25/21 1334 04/11/21 1557  WBC 4.4 4.2  NEUTROABS 1,795  --   HGB 13.1 12.1  HCT 38.6 36.4  MCV 92.8 93.3  PLT 198 216   Lipid Panel: Recent Labs    01/25/21 1334  CHOL 222*  HDL 72  LDLCALC 118*  TRIG 197*  CHOLHDL 3.1   TSH: Recent Labs    04/23/21 1433  TSH 0.93   A1C: Lab Results  Component Value Date   HGBA1C 5.9 (H) 03/07/2020     Assessment/Plan 1. Pain of right eye Called her ophthalmologist who is in [place] today and they offered her an appt, she reports she can not go to [place] to have evaluated due to sensitivity, they recommended eye doctor here in [place] however there was no openings. She stated she had no one to take her to [place]. Called several ophthalmology offices but no same day availability for evaluation. She opted to to go the ED for further evaluation at this time to rule out significant damage/abrasion.   Carlos American. [place], [facility] [phone]

## 2021-09-13 NOTE — ED Triage Notes (Signed)
Pt arrived via POV, c/o right eye pain. States believes eye was scratched while playing with grandson last night.

## 2021-09-13 NOTE — ED Provider Triage Note (Signed)
Emergency Medicine Provider Triage Evaluation Note  Laurie Gray , a 66 y.o. female  was evaluated in triage.  Pt complains of R eye pain since yesterday morning. Light sensitive and watering.   Review of Systems  Positive: R eye pain Negative: Fever   Physical Exam  BP 126/75 (BP Location: Left Arm)   Pulse 77   Temp 98.8 F (37.1 C) (Oral)   Resp 16   SpO2 100%  Gen:   Awake, no distress   Resp:  Normal effort  MSK:   Moves extremities without difficulty  Other:    Medical Decision Making  Medically screening exam initiated at 3:18 PM.  Appropriate orders placed.  Laurie Gray was informed that the remainder of the evaluation will be completed by another provider, this initial triage assessment does not replace that evaluation, and the importance of remaining in the ED until their evaluation is complete.  No contact lenses.    Laurie Gray, [place] 09/13/21 1519

## 2021-09-13 NOTE — ED Provider Notes (Signed)
[facility] DEPT Provider Note   CSN: [id number] Arrival date & time: 09/13/21  1442     History  Chief Complaint  Patient presents with   Eye Pain    Laurie Gray is a 66 y.o. female.   Eye Pain  Laurie Gray , a 66 y.o. female  was evaluated in [facility] ED.  Pt complains of R eye pain since yesterday morning when her grandchild scratched her eye with a finger while riding on the patient's back.  She states that her right eye has been light sensitive and watering since that time.  She went to her primary care office however they were unable to do an eye exam because they did not have the necessary equipment and recommended she go to the emergency room.     Home Medications Prior to Admission medications   Medication Sig Start Date End Date Taking? Authorizing Provider  erythromycin ophthalmic ointment Place a 1/2 inch ribbon of ointment into the lower eyelid. Four times daily 09/13/21  Yes Chantavia Bazzle, [place] S, PA  calcium carbonate (OS-CAL) 600 MG TABS tablet Take 1 tablet (600 mg total) by mouth 2 (two) times daily. 08/02/21   Lauree Chandler, NP  Cholecalciferol (VITAMIN D3) 50 MCG (2000 UT) capsule Take 1 capsule (2,000 Units total) by mouth daily. 08/02/21   Lauree Chandler, NP  levothyroxine (SYNTHROID) 75 MCG tablet TAKE 1 TABLET BY MOUTH DAILY BEFORE BREAKFAST. 04/18/21   Renato Shin, MD  Multiple Vitamins-Minerals (MULTIVITAMIN ADULTS 50+ PO) Take 1 tablet by mouth daily.    [provider]  ODEFSEY 200-25-25 MG TABS tablet TAKE 1 TABLET BY MOUTH EVERY DAY WITH BREAKFAST 05/08/21   Campbell Riches, MD  olmesartan-hydrochlorothiazide (BENICAR HCT) 20-12.5 MG tablet TAKE 1 TABLET BY MOUTH EVERY DAY 09/13/21   Lauree Chandler, NP  pravastatin (PRAVACHOL) 40 MG tablet TAKE 1 TABLET BY MOUTH EVERY DAY 06/04/21   Lauree Chandler, NP  rizatriptan (MAXALT) 10 MG tablet Take 1 tablet (10 mg total) by mouth as needed for  migraine. May repeat in 2 hours if needed 05/14/21   Genia Harold, MD  SUMAtriptan (IMITREX) 25 MG tablet TAKE 1 TABLET (25 MG TOTAL) BY MOUTH DAILY. MAY REPEAT IN 2 HOURS IF HEADACHE PERSISTS OR RECURS. 07/02/21   Lauree Chandler, NP  traMADol (ULTRAM) 50 MG tablet TAKE 1 TABLET BY MOUTH EVERY 8 HOURS AS NEEDED. 08/29/21   Lauree Chandler, NP      Allergies    Crestor [rosuvastatin calcium], Pravastatin, and Sulfonamide derivatives    Review of Systems   Review of Systems  Eyes:  Positive for pain.    Physical Exam Updated Vital Signs BP 121/80 (BP Location: Left Arm)   Pulse (!) 59   Temp 98 F (36.7 C) (Oral)   Resp 16   SpO2 100%  Physical Exam Vitals and nursing note reviewed.  Constitutional:      General: She is not in acute distress.    Appearance: Normal appearance. She is not ill-appearing.  HENT:     Head: Normocephalic and atraumatic.  Eyes:     General: No scleral icterus.       Right eye: No discharge.        Left eye: No discharge.     Extraocular Movements: Extraocular movements intact.     Conjunctiva/sclera: Conjunctivae normal.     Pupils: Pupils are equal, round, and reactive to light.     Comments:  Right eye pressures 11, 15, 15  Horizontally oriented linear corneal abrasion located just under the pupil of the right eye.  Pulmonary:     Effort: Pulmonary effort is normal.     Breath sounds: No stridor.  Neurological:     Mental Status: She is alert and oriented to person, place, and time. Mental status is at baseline.     ED Results / Procedures / Treatments   Labs (all labs ordered are listed, but only abnormal results are displayed) Labs Reviewed - No data to display  EKG None  Radiology No results found.  Procedures Procedures    Medications Ordered in ED Medications  tetracaine (PONTOCAINE) 0.5 % ophthalmic solution 2 drop (2 drops Right Eye Given by Other 09/13/21 1725)  fluorescein ophthalmic strip 1 strip (1 strip Right  Eye Given by Other 09/13/21 1628)    ED Course/ Medical Decision Making/ A&P                           Medical Decision Making Risk Prescription drug management.   Laurie Gray , a 66 y.o. female  was evaluated in [facility] ED.  Pt complains of R eye pain since yesterday morning when her grandchild scratched her eye with a finger while riding on the patient's back.  She states that her right eye has been light sensitive and watering since that time.  She went to her primary care office however they were unable to do an eye exam because they did not have the necessary equipment and recommended she go to the emergency room.  Corneal abrasion on exam.  Pressures of right eye less than 20.  Patient is not a contact lens wearer.  Will discharge with Romycin ointment, she will follow-up with ophthalmology as soon as possible.  Return precautions were discussed.   Final Clinical Impression(s) / ED Diagnoses Final diagnoses:  Abrasion of right cornea, initial encounter    Rx / DC Orders ED Discharge Orders          Ordered    erythromycin ophthalmic ointment        09/13/21 1720              Tedd Sias, [place] 09/13/21 2215    Margette Fast, MD 09/13/21 [phone]

## 2021-09-13 NOTE — Discharge Instructions (Signed)
Please follow-up with an ophthalmologist as soon as you are able, use the antibiotic ointment 4 times daily in your right eye.  Cool compresses can be helpful.

## 2021-09-17 DIAGNOSIS — S0501XA Injury of conjunctiva and corneal abrasion without foreign body, right eye, initial encounter: Secondary | ICD-10-CM | POA: Diagnosis not present

## 2021-09-25 ENCOUNTER — Ambulatory Visit (INDEPENDENT_AMBULATORY_CARE_PROVIDER_SITE_OTHER): Payer: Medicare Other | Admitting: Infectious Diseases

## 2021-09-25 ENCOUNTER — Other Ambulatory Visit: Payer: Self-pay

## 2021-09-25 ENCOUNTER — Encounter: Payer: Self-pay | Admitting: Infectious Diseases

## 2021-09-25 ENCOUNTER — Other Ambulatory Visit: Payer: Self-pay | Admitting: Nurse Practitioner

## 2021-09-25 VITALS — BP 96/61 | HR 69 | Temp 98.0°F | Ht 65.0 in | Wt 133.5 lb

## 2021-09-25 DIAGNOSIS — Z23 Encounter for immunization: Secondary | ICD-10-CM

## 2021-09-25 DIAGNOSIS — B20 Human immunodeficiency virus [HIV] disease: Secondary | ICD-10-CM | POA: Diagnosis not present

## 2021-09-25 DIAGNOSIS — E890 Postprocedural hypothyroidism: Secondary | ICD-10-CM | POA: Diagnosis not present

## 2021-09-25 DIAGNOSIS — M720 Palmar fascial fibromatosis [Dupuytren]: Secondary | ICD-10-CM

## 2021-09-25 DIAGNOSIS — M2550 Pain in unspecified joint: Secondary | ICD-10-CM

## 2021-09-25 MED ORDER — DOLUTEGRAVIR-LAMIVUDINE 50-300 MG PO TABS: 1.0000 | ORAL_TABLET | Freq: Every day | ORAL | 3 refills | Status: DC

## 2021-09-25 NOTE — Telephone Encounter (Signed)
Patient has request refill on medication Tramadol '50mg'$ . Patient medication last refilled 08/29/2021. Patient has Non Opioid Contract dated 04/02/2021. Patient medication pend and sent to PCP Dewaine Oats Carlos American, NP for approval. Please Advise.

## 2021-09-25 NOTE — Assessment & Plan Note (Signed)
Appreciate her endo f/u.  Suggest that getting rid of tenofovir may help her osteoperosis.

## 2021-09-25 NOTE — Progress Notes (Signed)
Subjective:    Patient ID: Laurie Gray, female  DOB: 1955/12/13, 66 y.o.        MRN: [id number]   HPI 66 yo F with hx of dx 2000 HIV+, depression, hyperlipidemia, prev lap hysterecomy-BSO (2009). 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  Had previous abn on colpo (SIL). Had hysterectomy many years ago (~ 25) then ovaries 15 yrs ago.  She had mammo 02-2017 which showed R cysts. Since normal mammos.    Had colon 2013. Had stool test (-) x 2 2019.    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  She also got radiocative Iodine Has had Endo f/u in the last 3 months.    Also hx of dupteryn's contractures- she has been seen by hand surgery. Had L 4th digit release which did not help.  She still has pain (and contractures) in all her digits.  Does not currently want surgery.   Was in ED with corneal abrasion in L eye 2 weeks. Her vision has not been completely normal yet.  Has optho f/u next week.   Was dx with osteoperosis recently via BMD.     HIV 1 RNA Quant  Date Value  04/11/2021 NOT DETECTED copies/mL  04/24/2020 Not Detected Copies/mL  07/25/2019 <20 NOT DETECTED copies/mL   CD4 T Cell Abs (/uL)  Date Value  04/11/2021 681  04/24/2020 644  07/25/2019 830     Health Maintenance  Topic Date Due  . INFLUENZA VACCINE  08/06/2021  . COVID-19 Vaccine (4 - Pfizer risk series) 12/03/2021 (Originally 12/25/2019)  . MAMMOGRAM  07/24/2023  . DEXA SCAN  07/24/2023  . COLONOSCOPY (Pts 45-96yr Insurance coverage will need to be confirmed)  01/09/2025  . TETANUS/TDAP  06/23/2030  . Pneumonia Vaccine 66 Years old  Completed  . Hepatitis C Screening  Completed  . HIV Screening  Completed  . Zoster Vaccines- Shingrix  Completed  . HPV VACCINES  Aged Out      Review of Systems  Constitutional:  Negative for chills, fever and weight loss.  HENT:  Positive for sore throat.   Respiratory:  Negative for cough and  shortness of breath.   Gastrointestinal:  Negative for constipation and diarrhea.  Genitourinary:  Negative for dysuria.  Musculoskeletal:  Positive for joint pain.  Psychiatric/Behavioral:  The patient does not have insomnia.    Please see HPI. All other systems reviewed and negative.     Objective:  Physical Exam Vitals reviewed.  Constitutional:      Appearance: Normal appearance.  HENT:     Mouth/Throat:     Mouth: Mucous membranes are moist.     Pharynx: Oropharynx is clear.  Cardiovascular:     Rate and Rhythm: Normal rate and regular rhythm.  Pulmonary:     Effort: Pulmonary effort is normal.     Breath sounds: Normal breath sounds.  Abdominal:     General: Abdomen is flat. Bowel sounds are normal. There is no distension.     Palpations: Abdomen is soft.     Tenderness: There is no abdominal tenderness.  Musculoskeletal:     Cervical back: Normal range of motion and neck supple. No tenderness.     Right lower leg: No edema.     Left lower leg: No edema.  Neurological:     General: No focal deficit present.     Mental Status: She is alert.  Psychiatric:        Mood  and Affect: Mood normal.          Assessment & Plan:

## 2021-09-25 NOTE — Assessment & Plan Note (Signed)
She has questions about newer rx. With her osteoperosis, will change her to dovato to see if this helps.  Getting flu shot today Will get COVID vax [facility].  Will see her back in 2 months

## 2021-10-03 ENCOUNTER — Ambulatory Visit (INDEPENDENT_AMBULATORY_CARE_PROVIDER_SITE_OTHER): Payer: Medicare Other | Admitting: Psychiatry

## 2021-10-03 VITALS — BP 117/74 | HR 60 | Ht 65.0 in | Wt 134.5 lb

## 2021-10-03 DIAGNOSIS — G43009 Migraine without aura, not intractable, without status migrainosus: Secondary | ICD-10-CM | POA: Diagnosis not present

## 2021-10-03 MED ORDER — UBRELVY 100 MG PO TABS: 100.0000 mg | ORAL_TABLET | ORAL | 0 refills | Status: AC | PRN

## 2021-10-03 NOTE — Progress Notes (Signed)
   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  At her last visit, brain MRI was ordered. She was started on Maxalt for migraine rescue. Was noted to be overusing tramadol and counseled on medication overuse headache.  Interval History: Headaches are less frequent since her last visit. She switched some of her other medications which seemed to help. Maxalt helps her headache but makes her drowsy. She tries to avoid taking it because of the side effects and will often try Excedrin instead.  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  Headache days per month: 8 Headache free days per month: 22  Current Headache Regimen: Preventative: none Abortive: Maxalt 10 mg PRN   Prior Therapies                                  Imitrex 25 mg PRN - drowsiness Maxalt 10 mg PRN - drowsiness Cymbalta 30 mg daily - lack of efficacy Tramadol Naproxen  Physical Exam:   Vital Signs: BP 117/74   Pulse 60   Ht '5\' 5"'$  (1.651 m)   Wt 134 lb 8 oz (61 kg)   BMI 22.38 kg/m  GENERAL:  well appearing, in no acute distress, alert  SKIN:  Color, texture, turgor normal. No rashes or lesions HEAD:  Normocephalic/atraumatic. RESP: normal respiratory effort MSK:  No gross joint deformities.   NEUROLOGICAL: Mental Status: Alert, oriented to person, place and time, Follows commands, and Speech fluent and appropriate. Cranial Nerves: PERRL, face symmetric, no dysarthria, hearing grossly intact Motor: moves all extremities equally Gait: normal-based.  IMPRESSION: 66 year old female with a history of  thyroid cancer s/p thyroidectomy 2018, s/p  parathyroidectomy 2018, HIV, HLD, HTN, pituitary adenoma, kidney stones, spasmodic dysphonia who presents for follow up of migraines. MRI with stable pituitary microadenoma vs cyst, images reviewed with patient today. Maxalt has helped for rescue but she avoids taking it because it makes her drowsy. She has failed two triptans due to side effects. Ubrelvy sample provided today. If this is effective will send in a prescription. She is not interested in starting a preventive medication at this time.  PLAN: -Rescue: Continue Maxalt 10 mg PRN for now. Ubrelvy sample provided. If effective will send in a prescription   Follow-up: 6 months  I spent a total of 40 minutes on the date of the service. Headache education was done. Discussed treatment options including preventive and acute medications. Discussed medication overuse headache and to limit use of acute treatments to no more than 2 days/week or 10 days/month. Discussed medication side effects, adverse reactions and drug interactions. Written educational materials and patient instructions outlining all of the above were given.  Genia Harold, MD 10/03/21 4:08 PM

## 2021-10-03 NOTE — Patient Instructions (Signed)
Try Laurie Gray as needed for migraines. Take one pill at onset of migraine. May repeat a dose in 2 hours if headache persists. If this medication is effective, let me know and I will send in a prescription for you

## 2021-10-11 ENCOUNTER — Encounter: Payer: Self-pay | Admitting: Infectious Diseases

## 2021-10-21 ENCOUNTER — Other Ambulatory Visit: Payer: Self-pay | Admitting: Family

## 2021-10-21 ENCOUNTER — Other Ambulatory Visit: Payer: Self-pay | Admitting: Nurse Practitioner

## 2021-10-21 DIAGNOSIS — G8929 Other chronic pain: Secondary | ICD-10-CM

## 2021-10-21 DIAGNOSIS — F32A Depression, unspecified: Secondary | ICD-10-CM

## 2021-10-21 DIAGNOSIS — M720 Palmar fascial fibromatosis [Dupuytren]: Secondary | ICD-10-CM

## 2021-10-21 DIAGNOSIS — M2550 Pain in unspecified joint: Secondary | ICD-10-CM

## 2021-10-21 NOTE — Telephone Encounter (Signed)
Patient is requesting a refill of the following medications: Requested Prescriptions   Pending Prescriptions Disp Refills   traMADol (ULTRAM) 50 MG tablet [Pharmacy Med Name: TRAMADOL HCL 50 MG TABLET] 90 tablet 0    Sig: TAKE 1 TABLET BY MOUTH EVERY 8 HOURS AS NEEDED    Date of last refill: 09/26/2021  Refill amount: 90  Treatment agreement date: 04/02/21

## 2021-10-23 ENCOUNTER — Other Ambulatory Visit: Payer: Self-pay | Admitting: Nurse Practitioner

## 2021-10-23 DIAGNOSIS — M720 Palmar fascial fibromatosis [Dupuytren]: Secondary | ICD-10-CM

## 2021-10-23 DIAGNOSIS — M2550 Pain in unspecified joint: Secondary | ICD-10-CM

## 2021-10-24 NOTE — Telephone Encounter (Signed)
Patient has request refill on medication Tramadol '50mg'$ . Patient last refill dated 09/26/2021. Patient has Non Opioid Contract on file dated 04/09/2021. Patient medication pend and sent to PCP Dewaine Oats Carlos American, NP for approval.

## 2021-11-01 ENCOUNTER — Encounter: Payer: Self-pay | Admitting: *Deleted

## 2021-11-01 ENCOUNTER — Telehealth (INDEPENDENT_AMBULATORY_CARE_PROVIDER_SITE_OTHER): Payer: Medicare Other | Admitting: Adult Health

## 2021-11-01 ENCOUNTER — Encounter: Payer: Self-pay | Admitting: Adult Health

## 2021-11-01 VITALS — BP 120/79 | Temp 97.8°F | Ht 65.0 in | Wt 134.5 lb

## 2021-11-01 DIAGNOSIS — U099 Post covid-19 condition, unspecified: Secondary | ICD-10-CM

## 2021-11-01 NOTE — Progress Notes (Signed)
DATE:  11/01/2021 MRN:  [id number]  BIRTHDAY: 07/26/1955   Contact Information     Name Relation Home Work [place] Son   [phone]   Lawana Chambers   [phone]        Code Status History     Date Active Date Inactive Code Status Order ID Comments User Context   01/31/2016 1706 02/01/2016 1717 Full Code [id number]  Izora Gala, MD Inpatient        Chief Complaint  Patient presents with   Acute Visit    Complains of positive Covid    HISTORY OF PRESENT ILLNESS: This is a 66 year old female who is being seen for an acute visit regarding testing positive for COVID-19. She has a PMH of thyroid cancer S/P thyroidectomy 2018, S/P parathyroidectomy 2018, HIV, HTN, pituitary adenoma, kidney stones, Dupuytren's contracture and spasmodic dysphonia. She has 3 COVID-19 vaccines. She tested positive for COVID-19 on October 12, 2021. She is wondering why she is still testing positive for COVID-19. She had coughing, chills and fever for the first week only. She now complains of headache, lightheadedness and brain fog.   PAST MEDICAL HISTORY:  Past Medical History:  Diagnosis Date   Anemia    Anxiety    hx of-not on any meds   Arthritis    "shoulders, arms, fingers, neck, back" (01/31/2016)   Chronic cervical pain    Family history of breast cancer    History of bronchitis    History of colon polyps    History of kidney stones    HIV infection ([place]) dx'd 2000   takes odefsey   Hyperlipidemia    takes Pravastatin daily   Hypertension    takes Benicar HCT daily   Hyperthyroidism    Hypothyroidism    Internal hemorrhoids    Joint pain    Migraine    "in the past" (01/31/2016)   Parathyroid adenoma    Pituitary adenoma ([place])    Refusal of blood transfusions as patient is Jehovah's Witness    "but substitutes are fine". (01/31/2016)   Thyroid cancer ([place]) 12/2015   Upper back pain      CURRENT MEDICATIONS: Reviewed  Patient's  Medications  New Prescriptions   No medications on file  Previous Medications   CALCIUM CARBONATE (OS-CAL) 600 MG TABS TABLET    Take 1 tablet (600 mg total) by mouth 2 (two) times daily.   CHOLECALCIFEROL (VITAMIN D3) 50 MCG (2000 UT) CAPSULE    Take 1 capsule (2,000 Units total) by mouth daily.   DOLUTEGRAVIR-LAMIVUDINE (DOVATO) 50-300 MG TABLET    Take 1 tablet by mouth daily.   ERYTHROMYCIN OPHTHALMIC OINTMENT    Place a 1/2 inch ribbon of ointment into the lower eyelid. Four times daily   LEVOTHYROXINE (SYNTHROID) 75 MCG TABLET    TAKE 1 TABLET BY MOUTH DAILY BEFORE BREAKFAST.   MULTIPLE VITAMINS-MINERALS (MULTIVITAMIN ADULTS 50+ PO)    Take 1 tablet by mouth daily.   OLMESARTAN-HYDROCHLOROTHIAZIDE (BENICAR HCT) 20-12.5 MG TABLET    TAKE 1 TABLET BY MOUTH EVERY DAY   PRAVASTATIN (PRAVACHOL) 40 MG TABLET    TAKE 1 TABLET BY MOUTH EVERY DAY   RIZATRIPTAN (MAXALT) 10 MG TABLET    Take 1 tablet (10 mg total) by mouth as needed for migraine. May repeat in 2 hours if needed   TRAMADOL (ULTRAM) 50 MG TABLET    TAKE 1 TABLET BY MOUTH EVERY 8 HOURS AS NEEDED   UBROGEPANT (UBRELVY) 100  MG TABS    Take 100 mg by mouth as needed.  Modified Medications   No medications on file  Discontinued Medications   No medications on file     Allergies  Allergen Reactions   Crestor [Rosuvastatin Calcium]     myalgias   Pravastatin     myalgias   Sulfonamide Derivatives Swelling    SWELLING REACTION UNSPECIFIED      REVIEW OF SYSTEMS:  GENERAL: no change in appetite, no fatigue, no weight changes, no fever, chills or weakness SKIN: Denies rash, itching, wounds, ulcer sores, or nail abnormality EYES: Denies change in vision, dry eyes, eye pain, itching or discharge EARS: Denies change in hearing, ringing in ears, or earache NOSE: Denies nasal congestion or epistaxis MOUTH and THROAT: Denies oral discomfort, gingival pain or bleeding, pain from teeth or hoarseness   RESPIRATORY: no cough, SOB, DOE,  wheezing, hemoptysis CARDIAC: no chest pain, edema or palpitations GI: no abdominal pain, diarrhea, constipation, heart burn, nausea or vomiting GU: Denies dysuria, frequency, hematuria, incontinence, or discharge MUSCULOSKELETAL: Denies joit pain, muscle pain, back pain, restricted movement, or unusual weakness CIRCULATION: Denies claudication, edema of legs, varicosities, or cold extremities NEUROLOGICAL: Denies dizziness, syncope, numbness, or headache PSYCHIATRIC: Denies feeling of depression or anxiety. No report of hallucinations, insomnia, paranoia, or agitation ENDOCRINE: Denies polyphagia, polyuria, polydipsia, heat or cold intolerance HEME/LYMPH: Denies excessive bruising, petechia, enlarged lymph nodes, or bleeding problems IMMUNOLOGIC: Denies history of frequent infections, AIDS, or use of immunosuppressive agents     LABS/RADIOLOGY: Labs reviewed: Basic Metabolic Panel: Recent Labs    01/25/21 1334 04/11/21 1557  NA 143 143  K 4.0 3.8  CL 102 104  CO2 35* 30  GLUCOSE 67 92  BUN 13 12  CREATININE 0.98 1.03  CALCIUM 10.0 10.2   Liver Function Tests: Recent Labs    01/25/21 1334 04/11/21 1557  AST 20 18  ALT 15 12  BILITOT 0.4 0.5  PROT 6.9 7.0   No results for input(s): "LIPASE", "AMYLASE" in the last 8760 hours. No results for input(s): "AMMONIA" in the last 8760 hours. CBC: Recent Labs    01/25/21 1334 04/11/21 1557  WBC 4.4 4.2  NEUTROABS 1,795  --   HGB 13.1 12.1  HCT 38.6 36.4  MCV 92.8 93.3  PLT 198 216   A1C: Invalid input(s): "A1C" Lipid Panel: Recent Labs    01/25/21 1334  HDL 72   Cardiac Enzymes: No results for input(s): "CKTOTAL", "CKMB", "CKMBINDEX", "TROPONINI" in the last 8760 hours. BNP: Invalid input(s): "POCBNP" CBG: No results for input(s): "GLUCAP" in the last 8760 hours.    No results found.  ASSESSMENT/PLAN:  1. COVID-19 long hauler -  concentration and memory problems persists for 6 weeks among COVID-19  patients -  fatigue, weakness and poor endurance may last for  3 months -  instructed patient to eat a balance diet, drink lots of fluids and exercise -  discussed that today is her 20 days of testing positive and she is no longer considered infectious -  she may test positive for COVID-19 for 3 months and does not need to keep testing for it - may take Tramadol PRN for pain    Time spent on non face to face visit:  14 minutes  The patient gave consent to this video visit. Explained to the patient the risk and privacy issue that was involved with this video call.   The patient was advised to call back and ask for an  in-person evaluation if the symptoms worsen or if the condition fails to improve.   Durenda Age, NP [employer] [phone]

## 2021-11-01 NOTE — Progress Notes (Signed)
This service is provided via telemedicine  Vitals within this encounter was collected by the patient at home.   Location of patient (ex: home, work):  home  Patient consents to a telephone visit:  yes, see telephone encounter 02/19/2021  Location of the provider (ex: office, home):  office  Name of any referring provider:  Lauree Chandler, NP   Names of all persons participating in the telemedicine service and their role in the encounter:  Monina Wynelle Cleveland NP, Tahisha Hakim/CMA, and patient  Time spent on call:  15

## 2021-11-01 NOTE — Telephone Encounter (Signed)
Error

## 2021-11-01 NOTE — Patient Instructions (Addendum)

## 2021-11-05 NOTE — Addendum Note (Signed)
Addended by: Truddie Crumble on: 11/05/2021 03:34 PM   Modules accepted: Orders

## 2021-11-05 NOTE — Addendum Note (Signed)
Addended by: Truddie Crumble on: 11/05/2021 03:33 PM   Modules accepted: Orders

## 2021-11-06 ENCOUNTER — Other Ambulatory Visit: Payer: Medicare Other

## 2021-11-07 ENCOUNTER — Other Ambulatory Visit: Payer: Medicare Other

## 2021-11-07 ENCOUNTER — Other Ambulatory Visit (HOSPITAL_COMMUNITY)
Admission: RE | Admit: 2021-11-07 | Discharge: 2021-11-07 | Disposition: A | Discharge status: Home / Self Care | Payer: Medicare Other | Source: Ambulatory Visit | Attending: Infectious Diseases | Admitting: Infectious Diseases

## 2021-11-07 DIAGNOSIS — Z113 Encounter for screening for infections with a predominantly sexual mode of transmission: Secondary | ICD-10-CM | POA: Diagnosis present

## 2021-11-07 DIAGNOSIS — Z79899 Other long term (current) drug therapy: Secondary | ICD-10-CM

## 2021-11-07 DIAGNOSIS — B20 Human immunodeficiency virus [HIV] disease: Secondary | ICD-10-CM

## 2021-11-08 LAB — T-HELPER CELL (CD4) - (RCID CLINIC ONLY)
CD4 % Helper T Cell: 45 % (ref 33–65)
CD4 T Cell Abs: 693 /uL (ref 400–1790)

## 2021-11-08 LAB — URINE CYTOLOGY ANCILLARY ONLY
Chlamydia: NEGATIVE
Comment: NEGATIVE
Comment: NORMAL
Neisseria Gonorrhea: NEGATIVE

## 2021-11-09 LAB — COMPREHENSIVE METABOLIC PANEL
ALT: 12 IU/L (ref 0–32)
AST: 18 IU/L (ref 0–40)
Albumin/Globulin Ratio: 1.7 (ref 1.2–2.2)
Albumin: 4 g/dL (ref 3.9–4.9)
Alkaline Phosphatase: 70 IU/L (ref 44–121)
BUN/Creatinine Ratio: 10 — ABNORMAL LOW (ref 12–28)
BUN: 9 mg/dL (ref 8–27)
Bilirubin Total: 0.3 mg/dL (ref 0.0–1.2)
CO2: 28 mmol/L (ref 20–29)
Calcium: 9.7 mg/dL (ref 8.7–10.3)
Chloride: 103 mmol/L (ref 96–106)
Creatinine, Ser: 0.92 mg/dL (ref 0.57–1.00)
Globulin, Total: 2.4 g/dL (ref 1.5–4.5)
Glucose: 110 mg/dL — ABNORMAL HIGH (ref 70–99)
Potassium: 4.3 mmol/L (ref 3.5–5.2)
Sodium: 144 mmol/L (ref 134–144)
Total Protein: 6.4 g/dL (ref 6.0–8.5)
eGFR: 69 mL/min/{1.73_m2} (ref >59)

## 2021-11-09 LAB — HIV-1 RNA QUANT-NO REFLEX-BLD: HIV-1 RNA Viral Load: <20 copies/mL

## 2021-11-09 LAB — CBC
Hematocrit: 32.5 % — ABNORMAL LOW (ref 34.0–46.6)
Hemoglobin: 11.1 g/dL (ref 11.1–15.9)
MCH: 31.9 pg (ref 26.6–33.0)
MCHC: 34.2 g/dL (ref 31.5–35.7)
MCV: 93 fL (ref 79–97)
Platelets: 246 10*3/uL (ref 150–450)
RBC: 3.48 x10E6/uL — ABNORMAL LOW (ref 3.77–5.28)
RDW: 12.8 % (ref 11.7–15.4)
WBC: 3.8 10*3/uL (ref 3.4–10.8)

## 2021-11-09 LAB — LIPID PANEL
Chol/HDL Ratio: 3.4 ratio (ref 0.0–4.4)
Cholesterol, Total: 175 mg/dL (ref 100–199)
HDL: 52 mg/dL (ref >39)
LDL Chol Calc (NIH): 104 mg/dL — ABNORMAL HIGH (ref 0–99)
Triglycerides: 103 mg/dL (ref 0–149)
VLDL Cholesterol Cal: 19 mg/dL (ref 5–40)

## 2021-11-09 LAB — RPR: RPR Ser Ql: NONREACTIVE

## 2021-11-20 ENCOUNTER — Other Ambulatory Visit: Payer: Self-pay

## 2021-11-20 ENCOUNTER — Ambulatory Visit (INDEPENDENT_AMBULATORY_CARE_PROVIDER_SITE_OTHER): Payer: Medicare Other | Admitting: Infectious Diseases

## 2021-11-20 ENCOUNTER — Encounter: Payer: Self-pay | Admitting: Infectious Diseases

## 2021-11-20 VITALS — BP 104/55 | HR 70 | Temp 98.6°F | Ht 65.0 in | Wt 131.2 lb

## 2021-11-20 DIAGNOSIS — K089 Disorder of teeth and supporting structures, unspecified: Secondary | ICD-10-CM | POA: Diagnosis not present

## 2021-11-20 DIAGNOSIS — I10 Essential (primary) hypertension: Secondary | ICD-10-CM | POA: Diagnosis not present

## 2021-11-20 DIAGNOSIS — R5383 Other fatigue: Secondary | ICD-10-CM

## 2021-11-20 DIAGNOSIS — B20 Human immunodeficiency virus [HIV] disease: Secondary | ICD-10-CM

## 2021-11-20 DIAGNOSIS — R519 Headache, unspecified: Secondary | ICD-10-CM

## 2021-11-20 NOTE — Assessment & Plan Note (Signed)
Will check her CBC (hgb had dropped slightly) and her Glc (eleavted at last visit).

## 2021-11-20 NOTE — Assessment & Plan Note (Signed)
Bp well controlled asx

## 2021-11-20 NOTE — Assessment & Plan Note (Signed)
Dental f/u next week.

## 2021-11-20 NOTE — Progress Notes (Signed)
Subjective:    Patient ID: Laurie Gray, female  DOB: 1955-06-28, 66 y.o.        MRN: [id number]   HPI 66 yo F with hx of dx 2000 HIV+, depression, hyperlipidemia, prev lap hysterecomy-BSO (2009). 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  Had previous abn on colpo (SIL). Had hysterectomy many years ago (~ 25) then ovaries 15 yrs ago.  She had mammo 02-2017 which showed R cysts. Since normal mammos.    Had colon 2013. Had stool test (-) x 2 2019.    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  She also got radiocative Iodine Has had Endo f/u next week.   Also hx of dupteryn's contractures- she has been seen by hand surgery. Had L 4th digit release which did not help.  She still has pain (and contractures) in all her digits.  Does not currently want surgery.    Was in ED with corneal abrasion in September.    Was dx with osteoperosis recently via BMD. Due to this at her f/u in Sept she was changed to [place] (to eliminate tdf).   She was sick after change but then found she had COVID. No further abn dreams.  Has noticed no other changes.   She has had headaches since COVID. Poor sleep, muscle and joint pains. Brain fog. Has lost taste as well (has had some improvement recently). Took no meds for this.  Noted to be quite anxious by nursing.   HIV 1 RNA Quant  Date Value  04/11/2021 NOT DETECTED copies/mL  04/24/2020 Not Detected Copies/mL  07/25/2019 <20 NOT DETECTED copies/mL   HIV-1 RNA Viral Load (copies/mL)  Date Value  11/07/2021 <20   CD4 T Cell Abs (/uL)  Date Value  11/07/2021 693  04/11/2021 681  04/24/2020 644     Health Maintenance  Topic Date Due  . COVID-19 Vaccine (4 - Pfizer risk series) 12/03/2021 (Originally 12/25/2019)  . Medicare Annual Wellness (AWV)  02/19/2022  . MAMMOGRAM  07/24/2023  . DEXA SCAN  07/24/2023  . COLONOSCOPY (Pts 45-68yr Insurance coverage will need to be confirmed)   01/09/2025  . TETANUS/TDAP  06/23/2030  . Pneumonia Vaccine 66 Years old  Completed  . INFLUENZA VACCINE  Completed  . Hepatitis C Screening  Completed  . Zoster Vaccines- Shingrix  Completed  . HPV VACCINES  Aged Out      Review of Systems  Constitutional:  Positive for malaise/fatigue. Negative for weight loss.  Eyes:  Negative for blurred vision and double vision.  Gastrointestinal:  Negative for blood in stool, constipation, diarrhea and vomiting.  Musculoskeletal:  Positive for myalgias.  Neurological:  Positive for headaches.  Psychiatric/Behavioral:  Positive for memory loss.     Please see HPI. All other systems reviewed and negative.     Objective:  Physical Exam Vitals reviewed.  Constitutional:      General: She is not in acute distress.    Appearance: Normal appearance. She is not ill-appearing.  HENT:     Mouth/Throat:     Mouth: Mucous membranes are moist.     Pharynx: No oropharyngeal exudate.  Cardiovascular:     Rate and Rhythm: Normal rate and regular rhythm.     Heart sounds: No murmur heard. Pulmonary:     Effort: Pulmonary effort is normal.     Breath sounds: Normal breath sounds.  Abdominal:     General: Bowel sounds are normal. There is no  distension.     Palpations: Abdomen is soft.     Tenderness: There is no abdominal tenderness.  Musculoskeletal:        General: Normal range of motion.     Cervical back: Normal range of motion and neck supple.     Right lower leg: No edema.     Left lower leg: No edema.  Neurological:     General: No focal deficit present.     Mental Status: She is alert.  Psychiatric:        Attention and Perception: Attention and perception normal.        Mood and Affect: Mood is anxious.          Assessment & Plan:

## 2021-11-20 NOTE — Assessment & Plan Note (Signed)
Normal BP Will watch to see if improves, post covid.

## 2021-11-20 NOTE — Assessment & Plan Note (Signed)
She is doing well her CD4 and HIV RNA post art change show efficacy  HIV 1 RNA Quant  Date Value  04/11/2021 NOT DETECTED copies/mL  04/24/2020 Not Detected Copies/mL  07/25/2019 <20 NOT DETECTED copies/mL   HIV-1 RNA Viral Load (copies/mL)  Date Value  11/07/2021 <20   CD4 T Cell Abs (/uL)  Date Value  11/07/2021 693  04/11/2021 681  04/24/2020 644

## 2021-11-21 ENCOUNTER — Other Ambulatory Visit: Payer: Self-pay | Admitting: Nurse Practitioner

## 2021-11-21 DIAGNOSIS — M2550 Pain in unspecified joint: Secondary | ICD-10-CM

## 2021-11-21 DIAGNOSIS — M720 Palmar fascial fibromatosis [Dupuytren]: Secondary | ICD-10-CM

## 2021-11-21 LAB — BASIC METABOLIC PANEL
BUN/Creatinine Ratio: 16 (ref 12–28)
BUN: 15 mg/dL (ref 8–27)
CO2: 27 mmol/L (ref 20–29)
Calcium: 9.7 mg/dL (ref 8.7–10.3)
Chloride: 101 mmol/L (ref 96–106)
Creatinine, Ser: 0.92 mg/dL (ref 0.57–1.00)
Glucose: 81 mg/dL (ref 70–99)
Potassium: 4.4 mmol/L (ref 3.5–5.2)
Sodium: 142 mmol/L (ref 134–144)
eGFR: 69 mL/min/{1.73_m2} (ref >59)

## 2021-11-21 LAB — CBC
Hematocrit: 35.9 % (ref 34.0–46.6)
Hemoglobin: 11.7 g/dL (ref 11.1–15.9)
MCH: 31.4 pg (ref 26.6–33.0)
MCHC: 32.6 g/dL (ref 31.5–35.7)
MCV: 96 fL (ref 79–97)
Platelets: 203 10*3/uL (ref 150–450)
RBC: 3.73 x10E6/uL — ABNORMAL LOW (ref 3.77–5.28)
RDW: 13.7 % (ref 11.7–15.4)
WBC: 4.2 10*3/uL (ref 3.4–10.8)

## 2021-11-21 NOTE — Telephone Encounter (Signed)
Medication that was refuse is not on active medications list.

## 2021-11-25 ENCOUNTER — Telehealth: Payer: Self-pay

## 2021-11-25 ENCOUNTER — Encounter: Payer: Self-pay | Admitting: Infectious Diseases

## 2021-11-25 DIAGNOSIS — M2550 Pain in unspecified joint: Secondary | ICD-10-CM

## 2021-11-25 MED ORDER — TRAMADOL HCL 50 MG PO TABS: 50.0000 mg | ORAL_TABLET | Freq: Three times a day (TID) | ORAL | 0 refills | Status: DC | PRN

## 2021-11-25 NOTE — Telephone Encounter (Signed)
Patient called requesting a refill on her Tramadol. I informed patient that Tramadol is no longer on her active medication list. Patient states Dr.Hatcher's office removed by accident and it should be on her list as tramadol 50 mg, 1 tablet by mouth three times daily.   Lauree Chandler, NP will have to determine if rx can be added back and approve for a refill ir warranted.   Treatment agreement on file from 04/02/2021   Please advise

## 2021-11-25 NOTE — Telephone Encounter (Signed)
Left message informing patient rx sent

## 2021-11-25 NOTE — Telephone Encounter (Signed)
Rx sent to pharmacy   

## 2021-12-23 ENCOUNTER — Other Ambulatory Visit: Payer: Self-pay | Admitting: Nurse Practitioner

## 2021-12-23 DIAGNOSIS — M2550 Pain in unspecified joint: Secondary | ICD-10-CM

## 2021-12-23 NOTE — Telephone Encounter (Signed)
Pharmacy requested refills.  Tramadol LR: 11/25/2021 Contract Date: 04/02/2021 Pended Rx's and sent to [facility] for approval.

## 2022-01-13 ENCOUNTER — Other Ambulatory Visit: Payer: Self-pay

## 2022-01-13 MED ORDER — LEVOTHYROXINE SODIUM 75 MCG PO TABS: 75.0000 ug | ORAL_TABLET | Freq: Every day | ORAL | 0 refills | Status: DC

## 2022-01-20 ENCOUNTER — Other Ambulatory Visit: Payer: Self-pay | Admitting: Nurse Practitioner

## 2022-01-20 DIAGNOSIS — M2550 Pain in unspecified joint: Secondary | ICD-10-CM

## 2022-01-20 NOTE — Telephone Encounter (Signed)
Patient has request refill on medication Tramadol. Patient medication last refilled 12/23/2021. Patient has Non Opioid Contract dated 04/09/2021. Patient medication pend and sent to PCP Dewaine Oats Carlos American, NP for approval.

## 2022-02-07 ENCOUNTER — Ambulatory Visit (INDEPENDENT_AMBULATORY_CARE_PROVIDER_SITE_OTHER): Payer: 59 | Admitting: Nurse Practitioner

## 2022-02-07 ENCOUNTER — Encounter: Payer: Self-pay | Admitting: Nurse Practitioner

## 2022-02-07 VITALS — BP 122/78 | HR 52 | Temp 97.9°F | Ht 65.0 in | Wt 133.0 lb

## 2022-02-07 DIAGNOSIS — M720 Palmar fascial fibromatosis [Dupuytren]: Secondary | ICD-10-CM | POA: Diagnosis not present

## 2022-02-07 DIAGNOSIS — F331 Major depressive disorder, recurrent, moderate: Secondary | ICD-10-CM

## 2022-02-07 DIAGNOSIS — I10 Essential (primary) hypertension: Secondary | ICD-10-CM

## 2022-02-07 DIAGNOSIS — E890 Postprocedural hypothyroidism: Secondary | ICD-10-CM | POA: Diagnosis not present

## 2022-02-07 DIAGNOSIS — M2550 Pain in unspecified joint: Secondary | ICD-10-CM

## 2022-02-07 DIAGNOSIS — B20 Human immunodeficiency virus [HIV] disease: Secondary | ICD-10-CM | POA: Diagnosis not present

## 2022-02-07 NOTE — Progress Notes (Signed)
Careteam: Patient Care Team: Sharon Seller, NP as PCP - General (Geriatric Medicine) Ginnie Smart, MD as PCP - Infectious Diseases (Infectious Diseases) Avel Peace, MD as Consulting Physician (General Surgery)  PLACE OF SERVICE:  [facility]  Advanced Directive information    Allergies  Allergen Reactions   Crestor [Rosuvastatin Calcium]     myalgias   Pravastatin     myalgias   Sulfonamide Derivatives Swelling    SWELLING REACTION UNSPECIFIED     Chief Complaint  Patient presents with   Medical Management of Chronic Issues    6 month follow-up. Discuss need for covid boosters and AWV(pending for 02/25/22)     HPI: Patient is a 67 y.o. female for routine follow up.   Will see endocrinologist in March to follow up thyroid  Saw ID in November and updated blood work.  Had COVID, had a lot of post covid fatigue.   Continues to use tramadol 50 mg by mouth three times daily- doing well on this.  Functioning well.   Depression/anxiety- talking to therapist. She has 8 week session 'able to" feels like this is very beneficial.  1 hour a week.   Review of Systems:  Review of Systems  Constitutional:  Negative for chills, fever and weight loss.  HENT:  Negative for tinnitus.   Respiratory:  Negative for cough, sputum production and shortness of breath.   Cardiovascular:  Negative for chest pain, palpitations and leg swelling.  Gastrointestinal:  Negative for abdominal pain, constipation, diarrhea and heartburn.  Genitourinary:  Negative for dysuria, frequency and urgency.  Musculoskeletal:  Positive for joint pain and myalgias. Negative for back pain and falls.  Skin: Negative.   Neurological:  Negative for dizziness and headaches.  Psychiatric/Behavioral:  Negative for depression and memory loss. The patient does not have insomnia.     Past Medical History:  Diagnosis Date   Anemia    Anxiety    hx of-not on any meds   Arthritis    "shoulders,  arms, fingers, neck, back" (01/31/2016)   Chronic cervical pain    Family history of breast cancer    History of bronchitis    History of colon polyps    History of kidney stones    HIV infection ([facility]) dx'd 2000   takes odefsey   Hyperlipidemia    takes Pravastatin daily   Hypertension    takes Benicar HCT daily   Hyperthyroidism    Hypothyroidism    Internal hemorrhoids    Joint pain    Migraine    "in the past" (01/31/2016)   Parathyroid adenoma    Pituitary adenoma ([facility])    Refusal of blood transfusions as patient is Jehovah's Witness    "but substitutes are fine". (01/31/2016)   Thyroid cancer ([facility]) 12/2015   Upper back pain    Past Surgical History:  Procedure Laterality Date   ABDOMINAL HYSTERECTOMY     BREAST EXCISIONAL BIOPSY Left    no scar seen    BUNIONECTOMY Right    COLONOSCOPY     CYSTOSCOPY     HAMMER TOE SURGERY Left    "middle toe was broke; turned into hammertoe then repaired"   HAMMER TOE SURGERY Bilateral    "both small toes"   PARATHYROIDECTOMY Right 01/31/2016   PARATHYROIDECTOMY Right 01/31/2016   Procedure: PARATHYROIDECTOMY ON RIGHT SIDE;  Surgeon: Serena Colonel, MD;  Location: [facility] OR;  Service: ENT;  Laterality: Right;   SALPINGOOPHORECTOMY Bilateral    THYROIDECTOMY  N/A 01/31/2016   Procedure: TOTAL THYROIDECTOMY;  Surgeon: Serena Colonel, MD;  Location: [facility] OR;  Service: ENT;  Laterality: N/A;   TOE SURGERY     bunion   TONSILLECTOMY     as a child   TOTAL THYROIDECTOMY  01/31/2016   TUBAL LIGATION     30 yrs ago   Social History:   reports that she has never smoked. She has never used smokeless tobacco. She reports that she does not currently use alcohol. She reports that she does not use drugs.  Family History  Problem Relation Age of Onset   Breast cancer Mother 57   Hypertension Mother    Cancer Mother    Hyperlipidemia Mother    Stroke Mother    Other Father        'vocal dysautonomia'   Breast cancer Sister 21   Thyroid disease  Sister    Osteoarthritis Maternal Grandmother    Colon cancer Neg Hx     Medications: Patient's Medications  New Prescriptions   No medications on file  Previous Medications   CALCIUM CARBONATE (OS-CAL) 600 MG TABS TABLET    Take 1 tablet (600 mg total) by mouth 2 (two) times daily.   CHOLECALCIFEROL (VITAMIN D3) 50 MCG (2000 UT) CAPSULE    Take 1 capsule (2,000 Units total) by mouth daily.   DOLUTEGRAVIR-LAMIVUDINE (DOVATO) 50-300 MG TABLET    Take 1 tablet by mouth daily.   LEVOTHYROXINE (SYNTHROID) 75 MCG TABLET    Take 1 tablet (75 mcg total) by mouth daily before breakfast.   MULTIPLE VITAMINS-MINERALS (MULTIVITAMIN ADULTS 50+ PO)    Take 1 tablet by mouth daily.   OLMESARTAN-HYDROCHLOROTHIAZIDE (BENICAR HCT) 20-12.5 MG TABLET    TAKE 1 TABLET BY MOUTH EVERY DAY   PRAVASTATIN (PRAVACHOL) 40 MG TABLET    TAKE 1 TABLET BY MOUTH EVERY DAY   TRAMADOL (ULTRAM) 50 MG TABLET    TAKE 1 TABLET BY MOUTH EVERY 8 HOURS AS NEEDED   UBROGEPANT (UBRELVY) 100 MG TABS    Take 100 mg by mouth as needed.  Modified Medications   No medications on file  Discontinued Medications   No medications on file    Physical Exam:  Vitals:   02/07/22 1104  BP: 122/78  Pulse: (!) 52  Temp: 97.9 F (36.6 C)  TempSrc: Temporal  SpO2: 99%  Weight: 133 lb (60.3 kg)  Height: 5\' 5"  (1.651 m)   Body mass index is 22.13 kg/m. Wt Readings from Last 3 Encounters:  02/07/22 133 lb (60.3 kg)  11/20/21 131 lb 3.2 oz (59.5 kg)  11/01/21 134 lb 8 oz (61 kg)    Physical Exam Constitutional:      General: She is not in acute distress.    Appearance: She is well-developed. She is not diaphoretic.  HENT:     Head: Normocephalic and atraumatic.     Mouth/Throat:     Pharynx: No oropharyngeal exudate.  Eyes:     Conjunctiva/sclera: Conjunctivae normal.     Pupils: Pupils are equal, round, and reactive to light.  Cardiovascular:     Rate and Rhythm: Normal rate and regular rhythm.     Heart sounds: Normal  heart sounds.  Pulmonary:     Effort: Pulmonary effort is normal.     Breath sounds: Normal breath sounds.  Abdominal:     General: Bowel sounds are normal.     Palpations: Abdomen is soft.  Musculoskeletal:        General:  Deformity (with contractures to bilatearl hands) present.     Cervical back: Normal range of motion and neck supple.     Right lower leg: No edema.     Left lower leg: No edema.  Skin:    General: Skin is warm and dry.  Neurological:     Mental Status: She is alert. Mental status is at baseline.  Psychiatric:        Mood and Affect: Mood normal.     Labs reviewed: Basic Metabolic Panel: Recent Labs    04/11/21 1557 04/23/21 1433 11/07/21 1438 11/20/21 1537  NA 143  --  144 142  K 3.8  --  4.3 4.4  CL 104  --  103 101  CO2 30  --  28 27  GLUCOSE 92  --  110* 81  BUN 12  --  9 15  CREATININE 1.03  --  0.92 0.92  CALCIUM 10.2  --  9.7 9.7  TSH  --  0.93  --   --    Liver Function Tests: Recent Labs    04/11/21 1557 11/07/21 1438  AST 18 18  ALT 12 12  ALKPHOS  --  70  BILITOT 0.5 0.3  PROT 7.0 6.4  ALBUMIN  --  4.0   No results for input(s): "LIPASE", "AMYLASE" in the last 8760 hours. No results for input(s): "AMMONIA" in the last 8760 hours. CBC: Recent Labs    04/11/21 1557 11/07/21 1438 11/20/21 1537  WBC 4.2 3.8 4.2  HGB 12.1 11.1 11.7  HCT 36.4 32.5* 35.9  MCV 93.3 93 96  PLT 216 246 203   Lipid Panel: Recent Labs    11/07/21 1438  CHOL 175  HDL 52  LDLCALC 104*  TRIG 103  CHOLHDL 3.4   TSH: Recent Labs    04/23/21 1433  TSH 0.93   A1C: Lab Results  Component Value Date   HGBA1C 5.9 (H) 03/07/2020     Assessment/Plan 1. Dupuytren's contracture of both hands -limited on ADLs due to progressive contractures, has seen orthopedic and not interested in surgery at this time - Ambulatory referral to Occupational Therapy for further evaluation and treatment as needed  2. Arthralgia, unspecified joint -stable  at this time.  3. Human immunodeficiency virus (HIV) disease ([facility]) -stable continues to follow up with ID  4. Essential hypertension, benign -Blood pressure well controlled, goal bp <140/90 Continue current medications and dietary modifications follow metabolic panel  5. Postoperative hypothyroidism -controlled on current regimen  6. Moderate episode of recurrent major depressive disorder ([facility]) Stable, meeting with therapist now and doing well.   Return in about 6 months (around 08/08/2022) for routine follow up.  Janene Harvey. Biagio Borg [facility] [phone]

## 2022-02-19 NOTE — Therapy (Deleted)
OUTPATIENT OCCUPATIONAL THERAPY ORTHO EVALUATION  Patient Name: Laurie Gray MRN: [id number] DOB:[birth year], 67 y.o., female Today's Date: 02/19/2022  PCP: Marland Kitchen REFERRING PROVIDER: Lauree Chandler, NP   END OF SESSION:   Past Medical History:  Diagnosis Date   Anemia    Anxiety    hx of-not on any meds   Arthritis    "shoulders, arms, fingers, neck, back" (01/31/2016)   Chronic cervical pain    Family history of breast cancer    History of bronchitis    History of colon polyps    History of kidney stones    HIV infection ([place]) dx'd 2000   takes odefsey   Hyperlipidemia    takes Pravastatin daily   Hypertension    takes Benicar HCT daily   Hyperthyroidism    Hypothyroidism    Internal hemorrhoids    Joint pain    Migraine    "in the past" (01/31/2016)   Parathyroid adenoma    Pituitary adenoma ([place])    Refusal of blood transfusions as patient is Jehovah's Witness    "but substitutes are fine". (01/31/2016)   Thyroid cancer ([place]) 12/2015   Upper back pain    Past Surgical History:  Procedure Laterality Date   ABDOMINAL HYSTERECTOMY     BREAST EXCISIONAL BIOPSY Left    no scar seen    BUNIONECTOMY Right    COLONOSCOPY     CYSTOSCOPY     HAMMER TOE SURGERY Left    "middle toe was broke; turned into hammertoe then repaired"   HAMMER TOE SURGERY Bilateral    "both small toes"   PARATHYROIDECTOMY Right 01/31/2016   PARATHYROIDECTOMY Right 01/31/2016   Procedure: PARATHYROIDECTOMY ON RIGHT SIDE;  Surgeon: Izora Gala, MD;  Location: [facility];  Service: ENT;  Laterality: Right;   SALPINGOOPHORECTOMY Bilateral    THYROIDECTOMY N/A 01/31/2016   Procedure: TOTAL THYROIDECTOMY;  Surgeon: Izora Gala, MD;  Location: 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   Genetic testing 01/04/2018   Family history of breast cancer    Hypothyroidism 08/06/2017   Arthralgia 02/04/2017   Hyperparathyroidism ([place]) 02/04/2017   Skin lesion of left arm 12/04/2016   Poor dentition 12/04/2016   Adhesive capsulitis of both shoulders 09/24/2016   High risk medication use 09/24/2016   Chronic right shoulder pain 02/18/2016   Chronic left shoulder pain 02/18/2016   Neck pain 02/18/2016   Thyroid cancer ([place]) 01/31/2016   Status post surgery 05/29/2015   Hammertoe 04/02/2015   S/P bunionectomy 11/08/2014   Arthritis 07/07/2014   Osteopenia 04/19/2014   Routine general medical examination at a health care facility 03/28/2014   Depression 11/02/2013   Hyperlipidemia 11/02/2013   Vaginal dysplasia 04/28/2013   Vaginal atrophy 04/06/2013   Right hip pain 08/23/2012   Pain, dental 04/07/2012   Vaginal bleeding 02/17/2012   Headache 12/01/2011   Cellulitis 07/03/2010   Contracture of finger joint 07/03/2010   Disorder of bone and cartilage 09/27/2009   HAND PAIN,  RIGHT 09/24/2009   Essential hypertension, benign 07/27/2008   BREAST MASS 07/03/2008   Human immunodeficiency virus (HIV) disease ([place]) 04/03/2008   HSV 04/03/2008    ONSET DATE: 02/07/22  REFERRING DIAG: M72.0 (ICD-10-CM) - Dupuytren's contracture of both  THERAPY DIAG:  No diagnosis found.  Rationale for Evaluation and Treatment: Rehabilitation  SUBJECTIVE:   SUBJECTIVE STATEMENT: *** Pt accompanied by: {accompnied:27141}  PERTINENT HISTORY: Diagnosis M72.0 (ICD-10-CM) - Dupuytren's contracture of both PMH HIV, depression, hypothyroidism, HTN, arthralgia     PRECAUTIONS: {Therapy precautions:24002}  WEIGHT BEARING RESTRICTIONS: {Yes ***/No:24003}  PAIN:   Are you having pain? {OPRCPAIN:27236}  FALLS: Has patient fallen in last 6 months? {fallsyesno:27318}  LIVING ENVIRONMENT: Lives with: {OPRC lives with:25569::"lives with their family"} Lives in: {Lives in:25570} Stairs: {opstairs:27293} Has following equipment at home: {Assistive devices:23999}  PLOF: {PLOF:24004}  PATIENT GOALS: ***  NEXT MD VISIT: ***  OBJECTIVE:   HAND DOMINANCE: {MISC; OT HAND DOMINANCE:[id number]}  ADLs: Overall ADLs: *** Transfers/ambulation related to ADLs: Eating: *** Grooming: *** UB Dressing: *** LB Dressing: *** Toileting: *** Bathing: *** Tub Shower transfers: *** Equipment: {equipment:25573}  FUNCTIONAL OUTCOME MEASURES: {OTFUNCTIONALMEASURES:27238}  UPPER EXTREMITY ROM:     {AROM/PROM:27142} ROM Right eval Left eval  Shoulder flexion    Shoulder abduction    Shoulder adduction    Shoulder extension    Shoulder internal rotation    Shoulder external rotation    Elbow flexion    Elbow extension    Wrist flexion    Wrist extension    Wrist ulnar deviation    Wrist radial deviation    Wrist pronation    Wrist supination    (Blank rows = not tested)  {AROM/PROM:27142} ROM Right eval Left eval  Thumb MCP (0-60)    Thumb IP (0-80)    Thumb Radial abd/add (0-55)     Thumb Palmar abd/add (0-45)     Thumb Opposition to Small Finger     Index MCP (0-90)     Index PIP (0-100)     Index DIP (0-70)      Long MCP (0-90)      Long PIP (0-100)      Long DIP (0-70)      Ring MCP (0-90)      Ring PIP (0-100)      Ring DIP (0-70)      Little MCP (0-90)      Little PIP (0-100)      Little DIP (0-70)      (Blank rows = not tested)   UPPER EXTREMITY MMT:     MMT Right eval Left eval  Shoulder flexion    Shoulder abduction    Shoulder adduction    Shoulder extension    Shoulder internal rotation    Shoulder external rotation    Middle trapezius    Lower trapezius    Elbow flexion    Elbow extension    Wrist  flexion    Wrist extension    Wrist ulnar deviation    Wrist radial deviation    Wrist pronation    Wrist supination    (Blank rows = not tested)  HAND FUNCTION: {handfunction:27230}  COORDINATION: {otcoordination:27237}  SENSATION: {sensation:27233}  EDEMA: ***  COGNITION: Overall cognitive status: {cognition:24006} Areas of impairment: {impairedcognition:27234}  OBSERVATIONS: ***   TODAY'S TREATMENT:  DATE: ***    PATIENT EDUCATION: Education details: *** Person educated: {Person educated:25204} Education method: {Education Method:25205} Education comprehension: {Education Comprehension:25206}  HOME EXERCISE PROGRAM: ***  GOALS: Goals reviewed with patient? {yes/no:20286}  SHORT TERM GOALS: Target date: ***  *** Baseline: Goal status: {GOALSTATUS:25110}  2.  *** Baseline:  Goal status: {GOALSTATUS:25110}  3.  *** Baseline:  Goal status: {GOALSTATUS:25110}  4.  *** Baseline:  Goal status: {GOALSTATUS:25110}  5.  *** Baseline:  Goal status: {GOALSTATUS:25110}  6.  *** Baseline:  Goal status: {GOALSTATUS:25110}  LONG TERM GOALS: Target date: ***  *** Baseline:  Goal status: {GOALSTATUS:25110}  2.  *** Baseline:  Goal status: {GOALSTATUS:25110}  3.  *** Baseline:  Goal status: {GOALSTATUS:25110}  4.  *** Baseline:  Goal status: {GOALSTATUS:25110}  5.  *** Baseline:  Goal status: {GOALSTATUS:25110}  6.  *** Baseline:  Goal status: {GOALSTATUS:25110}  ASSESSMENT:  CLINICAL IMPRESSION: Patient is a *** y.o. *** who was seen today for occupational therapy evaluation for ***.   PERFORMANCE DEFICITS: in functional skills including {OT physical skills:25468}, cognitive skills including {OT cognitive skills:25469}, and psychosocial skills including {OT psychosocial skills:25470}.   IMPAIRMENTS: are  limiting patient from {OT performance deficits:25471}.   COMORBIDITIES: {Comorbidities:25485} that affects occupational performance. Patient will benefit from skilled OT to address above impairments and improve overall function.  MODIFICATION OR ASSISTANCE TO COMPLETE EVALUATION: {OT modification:25474}  OT OCCUPATIONAL PROFILE AND HISTORY: {OT PROFILE AND HISTORY:25484}  CLINICAL DECISION MAKING: {OT CDM:25475}  REHAB POTENTIAL: {rehabpotential:25112}  EVALUATION COMPLEXITY: {Evaluation complexity:25115}      PLAN:  OT FREQUENCY: {rehab frequency:25116}  OT DURATION: {rehab duration:25117}  PLANNED INTERVENTIONS: {OT Interventions:25467}  RECOMMENDED OTHER SERVICES: ***  CONSULTED AND AGREED WITH PLAN OF CARE: [id number]  PLAN FOR NEXT SESSION: Theone Murdoch, OT 02/19/2022, 4:32 PM

## 2022-02-20 ENCOUNTER — Ambulatory Visit: Payer: 59 | Attending: Occupational Therapy | Admitting: Occupational Therapy

## 2022-02-20 ENCOUNTER — Other Ambulatory Visit: Payer: Self-pay | Admitting: Nurse Practitioner

## 2022-02-20 DIAGNOSIS — M2550 Pain in unspecified joint: Secondary | ICD-10-CM

## 2022-02-20 MED ORDER — TRAMADOL HCL 50 MG PO TABS: 50.0000 mg | ORAL_TABLET | Freq: Three times a day (TID) | ORAL | 0 refills | Status: DC | PRN

## 2022-02-20 NOTE — Telephone Encounter (Signed)
Patient request refill on Tramadol 50 mg tablet. Medication last filled 01/20/2022. Patient has up to date contract on file.  Medication pended and sent to [name], NP

## 2022-02-25 ENCOUNTER — Ambulatory Visit (INDEPENDENT_AMBULATORY_CARE_PROVIDER_SITE_OTHER): Payer: 59 | Admitting: Nurse Practitioner

## 2022-02-25 ENCOUNTER — Encounter: Payer: Self-pay | Admitting: Nurse Practitioner

## 2022-02-25 VITALS — BP 119/75 | HR 70 | Ht 65.0 in | Wt 133.0 lb

## 2022-02-25 DIAGNOSIS — Z0000 Encounter for general adult medical examination without abnormal findings: Secondary | ICD-10-CM | POA: Diagnosis not present

## 2022-02-25 NOTE — Patient Instructions (Signed)
Laurie Gray , Thank you for taking time to come for your Medicare Wellness Visit. I appreciate your ongoing commitment to your health goals. Please review the following plan we discussed and let me know if I can assist you in the future.   Screening recommendations/referrals: Colonoscopy up to date Mammogram due in July Bone Density up to date Recommended yearly ophthalmology/optometry visit for glaucoma screening and checkup Recommended yearly dental visit for hygiene and checkup  Vaccinations: Influenza vaccine- due annually in September/October Pneumococcal vaccine up to date Tdap vaccine up to date Shingles vaccine up to date    Advanced directives: recommended to bring to place on file  Conditions/risks identified: advanced age  Next appointment: yearly   Preventive Care 31 Years and Older, Female Preventive care refers to lifestyle choices and visits with your health care provider that can promote health and wellness. What does preventive care include? A yearly physical exam. This is also called an annual well check. Dental exams once or twice a year. Routine eye exams. Ask your health care provider how often you should have your eyes checked. Personal lifestyle choices, including: Daily care of your teeth and gums. Regular physical activity. Eating a healthy diet. Avoiding tobacco and drug use. Limiting alcohol use. Practicing safe sex. Taking low-dose aspirin every day. Taking vitamin and mineral supplements as recommended by your health care provider. What happens during an annual well check? The services and screenings done by your health care provider during your annual well check will depend on your age, overall health, lifestyle risk factors, and family history of disease. Counseling  Your health care provider may ask you questions about your: Alcohol use. Tobacco use. Drug use. Emotional well-being. Home and relationship well-being. Sexual activity. Eating  habits. History of falls. Memory and ability to understand (cognition). Work and work Statistician. Reproductive health. Screening  You may have the following tests or measurements: Height, weight, and BMI. Blood pressure. Lipid and cholesterol levels. These may be checked every 5 years, or more frequently if you are over 58 years old. Skin check. Lung cancer screening. You may have this screening every year starting at age 27 if you have a 30-pack-year history of smoking and currently smoke or have quit within the past 15 years. Fecal occult blood test (FOBT) of the stool. You may have this test every year starting at age 71. Flexible sigmoidoscopy or colonoscopy. You may have a sigmoidoscopy every 5 years or a colonoscopy every 10 years starting at age 30. Hepatitis C blood test. Hepatitis B blood test. Sexually transmitted disease (STD) testing. Diabetes screening. This is done by checking your blood sugar (glucose) after you have not eaten for a while (fasting). You may have this done every 1-3 years. Bone density scan. This is done to screen for osteoporosis. You may have this done starting at age 41. Mammogram. This may be done every 1-2 years. Talk to your health care provider about how often you should have regular mammograms. Talk with your health care provider about your test results, treatment options, and if necessary, the need for more tests. Vaccines  Your health care provider may recommend certain vaccines, such as: Influenza vaccine. This is recommended every year. Tetanus, diphtheria, and acellular pertussis (Tdap, Td) vaccine. You may need a Td booster every 10 years. Zoster vaccine. You may need this after age 26. Pneumococcal 13-valent conjugate (PCV13) vaccine. One dose is recommended after age 49. Pneumococcal polysaccharide (PPSV23) vaccine. One dose is recommended after age 61. Talk  to your health care provider about which screenings and vaccines you need and how  often you need them. This information is not intended to replace advice given to you by your health care provider. Make sure you discuss any questions you have with your health care provider. Document Released: 01/19/2015 Document Revised: 09/12/2015 Document Reviewed: 10/24/2014 Elsevier Interactive Patient Education  2017 [place] Prevention in the Home Falls can cause injuries. They can happen to people of all ages. There are many things you can do to make your home safe and to help prevent falls. What can I do on the outside of my home? Regularly fix the edges of walkways and driveways and fix any cracks. Remove anything that might make you trip as you walk through a door, such as a raised step or threshold. Trim any bushes or trees on the path to your home. Use bright outdoor lighting. Clear any walking paths of anything that might make someone trip, such as rocks or tools. Regularly check to see if handrails are loose or broken. Make sure that both sides of any steps have handrails. Any raised decks and porches should have guardrails on the edges. Have any leaves, snow, or ice cleared regularly. Use sand or salt on walking paths during winter. Clean up any spills in your garage right away. This includes oil or grease spills. What can I do in the bathroom? Use night lights. Install grab bars by the toilet and in the tub and shower. Do not use towel bars as grab bars. Use non-skid mats or decals in the tub or shower. If you need to sit down in the shower, use a plastic, non-slip stool. Keep the floor dry. Clean up any water that spills on the floor as soon as it happens. Remove soap buildup in the tub or shower regularly. Attach bath mats securely with double-sided non-slip rug tape. Do not have throw rugs and other things on the floor that can make you trip. What can I do in the bedroom? Use night lights. Make sure that you have a light by your bed that is easy to  reach. Do not use any sheets or blankets that are too big for your bed. They should not hang down onto the floor. Have a firm chair that has side arms. You can use this for support while you get dressed. Do not have throw rugs and other things on the floor that can make you trip. What can I do in the kitchen? Clean up any spills right away. Avoid walking on wet floors. Keep items that you use a lot in easy-to-reach places. If you need to reach something above you, use a strong step stool that has a grab bar. Keep electrical cords out of the way. Do not use floor polish or wax that makes floors slippery. If you must use wax, use non-skid floor wax. Do not have throw rugs and other things on the floor that can make you trip. What can I do with my stairs? Do not leave any items on the stairs. Make sure that there are handrails on both sides of the stairs and use them. Fix handrails that are broken or loose. Make sure that handrails are as long as the stairways. Check any carpeting to make sure that it is firmly attached to the stairs. Fix any carpet that is loose or worn. Avoid having throw rugs at the top or bottom of the stairs. If you do have throw rugs,  attach them to the floor with carpet tape. Make sure that you have a light switch at the top of the stairs and the bottom of the stairs. If you do not have them, ask someone to add them for you. What else can I do to help prevent falls? Wear shoes that: Do not have high heels. Have rubber bottoms. Are comfortable and fit you well. Are closed at the toe. Do not wear sandals. If you use a stepladder: Make sure that it is fully opened. Do not climb a closed stepladder. Make sure that both sides of the stepladder are locked into place. Ask someone to hold it for you, if possible. Clearly mark and make sure that you can see: Any grab bars or handrails. First and last steps. Where the edge of each step is. Use tools that help you move  around (mobility aids) if they are needed. These include: Canes. Walkers. Scooters. Crutches. Turn on the lights when you go into a dark area. Replace any light bulbs as soon as they burn out. Set up your furniture so you have a clear path. Avoid moving your furniture around. If any of your floors are uneven, fix them. If there are any pets around you, be aware of where they are. Review your medicines with your doctor. Some medicines can make you feel dizzy. This can increase your chance of falling. Ask your doctor what other things that you can do to help prevent falls. This information is not intended to replace advice given to you by your health care provider. Make sure you discuss any questions you have with your health care provider. Document Released: 10/19/2008 Document Revised: 05/31/2015 Document Reviewed: 01/27/2014 Elsevier Interactive Patient Education  2017 [employer].

## 2022-02-25 NOTE — Progress Notes (Signed)
Subjective:   Laurie Gray is a 67 y.o. female who presents for Medicare Annual (Subsequent) preventive examination.  Review of Systems     Cardiac Risk Factors include: advanced age (>27mn, >>39women);hypertension;dyslipidemia     Objective:    Today's Vitals   02/25/22 1022 02/25/22 1042  BP: 119/75   Pulse: 70   Weight: 133 lb (60.3 kg)   Height: 5' 5"$  (1.651 m)   PainSc:  3    Body mass index is 22.13 kg/m.     02/25/2022    8:44 AM 11/20/2021    3:07 PM 11/01/2021    8:52 AM 09/25/2021    3:17 PM 09/13/2021    1:29 PM 09/13/2021   12:30 PM 08/02/2021   12:57 PM  Advanced Directives  Does Patient Have a Medical Advance Directive? Yes Yes Yes Yes No No No  Type of AParamedicof AOfferleLiving will HCrowleyLiving will       Does patient want to make changes to medical advance directive? No - Patient declined No - Patient declined No - Patient declined      Copy of H[place]in Chart? No - copy requested No - copy requested       Would patient like information on creating a medical advance directive?     No - Patient declined Yes (MAU/Ambulatory/Procedural Areas - Information given) No - Patient declined    Current Medications (verified) Outpatient Encounter Medications as of 02/25/2022  Medication Sig   acetaminophen (TYLENOL) 325 MG tablet Take 650 mg by mouth as needed.   amoxicillin (AMOXIL) 250 MG capsule Take 250 mg by mouth daily. For tooth abscess per the dentist   calcium carbonate (OS-CAL) 600 MG TABS tablet Take 1 tablet (600 mg total) by mouth 2 (two) times daily.   Cholecalciferol (VITAMIN D3) 50 MCG (2000 UT) capsule Take 1 capsule (2,000 Units total) by mouth daily.   dolutegravir-lamiVUDine (DOVATO) 50-300 MG tablet Take 1 tablet by mouth daily.   levothyroxine (SYNTHROID) 75 MCG tablet Take 1 tablet (75 mcg total) by mouth daily before breakfast.   Multiple Vitamins-Minerals  (MULTIVITAMIN ADULTS 50+ PO) Take 1 tablet by mouth daily.   olmesartan-hydrochlorothiazide (BENICAR HCT) 20-12.5 MG tablet TAKE 1 TABLET BY MOUTH EVERY DAY   pravastatin (PRAVACHOL) 40 MG tablet TAKE 1 TABLET BY MOUTH EVERY DAY   traMADol (ULTRAM) 50 MG tablet Take 1 tablet (50 mg total) by mouth every 8 (eight) hours as needed.   Ubrogepant (UBRELVY) 100 MG TABS Take 100 mg by mouth as needed.   No facility-administered encounter medications on file as of 02/25/2022.    Allergies (verified) Crestor [rosuvastatin calcium], Pravastatin, and Sulfonamide derivatives   History: Past Medical History:  Diagnosis Date   Anemia    Anxiety    hx of-not on any meds   Arthritis    "shoulders, arms, fingers, neck, back" (01/31/2016)   Chronic cervical pain    Family history of breast cancer    History of bronchitis    History of colon polyps    History of kidney stones    [place] [place]   takes odefsey   Hyperlipidemia    takes Pravastatin daily   Hypertension    takes Benicar HCT daily   Hyperthyroidism    Hypothyroidism    Internal hemorrhoids    Joint pain    Migraine    "in the past" (01/31/2016)   Parathyroid  adenoma    Pituitary adenoma ([place])    Refusal of blood transfusions as patient is Jehovah's Witness    "but substitutes are fine". (01/31/2016)   Thyroid cancer ([place]) 12/2015   Upper back pain    Past Surgical History:  Procedure Laterality Date   ABDOMINAL HYSTERECTOMY     BREAST EXCISIONAL BIOPSY Left    no scar seen    BUNIONECTOMY Right    COLONOSCOPY     CYSTOSCOPY     HAMMER TOE SURGERY Left    "middle toe was broke; turned into hammertoe then repaired"   HAMMER TOE SURGERY Bilateral    "both small toes"   PARATHYROIDECTOMY Right 01/31/2016   PARATHYROIDECTOMY Right 01/31/2016   Procedure: PARATHYROIDECTOMY ON RIGHT SIDE;  Surgeon: Izora Gala, MD;  Location: [facility];  Service: ENT;  Laterality: Right;   SALPINGOOPHORECTOMY Bilateral     THYROIDECTOMY N/A 01/31/2016   Procedure: TOTAL THYROIDECTOMY;  Surgeon: Izora Gala, MD;  Location: [facility] [facility] OR;  Service: ENT;  Laterality: N/A;   TOE SURGERY     bunion   TONSILLECTOMY     as a child   TOTAL THYROIDECTOMY  01/31/2016   TUBAL LIGATION     23 yrs ago   Family History  Problem Relation Age of Onset   Breast cancer Mother 73   Hypertension Mother    Cancer Mother    Hyperlipidemia Mother    Stroke Mother    Other Father        'vocal dysautonomia'   Breast cancer Sister 35   Thyroid disease Sister    Osteoarthritis Maternal Grandmother    Colon cancer Neg Hx    Social History   Socioeconomic History   Marital status: Widowed    Spouse name: Not on file   Number of children: Not on file   Years of education: Not on file   Highest education level: Not on file  Occupational History   Not on file  Tobacco Use   Smoking status: Never   Smokeless tobacco: Never  Vaping Use   Vaping Use: Never used  Substance and Sexual Activity   Alcohol use: Not Currently    Alcohol/week: 0.0 standard drinks of alcohol    Comment: 01/31/2016 "might go out to eat and have a drink once/month; no more than that"   Drug use: No   Sexual activity: Not Currently    Partners: Male    Comment: declined condoms  Other Topics Concern   Not on file  Social History Narrative   Normal diet   Drinks Caffeine- coffee/coke/chocolate    Lives in an apartment/ third level/1 person/1 pet   Past profession- Museum/gallery exhibitions officer, cosmetologist   Exercises 3-4 x weekly (cardio and weights)    Right handed    Social Determinants of Health   Financial Resource Strain: Low Risk  (01/21/2017)   Overall Financial Resource Strain (CARDIA)    Difficulty of Paying Living Expenses: Not hard at all  Food Insecurity: No Food Insecurity (01/21/2017)   Hunger Vital Sign    Worried About Running Out of Food in the Last Year: Never true    Ran Out of Food in the Last Year: Never true  Transportation Needs: No  Transportation Needs (01/21/2017)   PRAPARE - Hydrologist (Medical): No    Lack of Transportation (Non-Medical): No  Physical Activity: Inactive (01/21/2017)   Exercise Vital Sign    Days of Exercise per Week: 0 days  Minutes of Exercise per Session: 0 min  Stress: No Stress Concern Present (01/21/2017)   [place]    Feeling of Stress : Only a little  Social Connections: Somewhat Isolated (01/21/2017)   Social Connection and Isolation Panel [NHANES]    Frequency of Communication with Friends and Family: More than three times a week    Frequency of Social Gatherings with Friends and Family: More than three times a week    Attends Religious Services: More than 4 times per year    Active Member of [employer] or Organizations: No    Attends Archivist Meetings: Never    Marital Status: Widowed    Tobacco Counseling Counseling given: Not Answered   Clinical Intake:  Pre-visit preparation completed: Yes  Pain : 0-10 Pain Score: 3  Pain Type: Chronic pain Pain Location: Generalized     BMI - recorded: 22.13 Diabetes: No  How often do you need to have someone help you when you read instructions, pamphlets, or other written materials from your doctor or pharmacy?: 1 - Never  Diabetic?no         Activities of Daily Living    02/25/2022   10:40 AM 11/20/2021    3:06 PM  In your present state of health, do you have any difficulty performing the following activities:  Hearing? 0 0  Vision? 0 1  Difficulty concentrating or making decisions? 0 1  Walking or climbing stairs? 0 0  Dressing or bathing? 0 0  Doing errands, shopping? 0 0  Preparing Food and eating ? N   Using the Toilet? N   In the past six months, have you accidently leaked urine? Y   Do you have problems with loss of bowel control? N   Managing your Medications? N   Managing your Finances? N    Housekeeping or managing your Housekeeping? N     Patient Care Team: Lauree Chandler, NP as PCP - General (Geriatric Medicine) Campbell Riches, MD as PCP - Infectious Diseases (Infectious Diseases) Jackolyn Confer, MD as Consulting Physician (General Surgery)  Indicate any recent Medical Services you may have received from other than [facility] providers in the past year (date may be approximate).     Assessment:   This is a routine wellness examination for Laurie Gray.  Hearing/Vision screen Hearing Screening - Comments:: No hearing issues Vision Screening - Comments:: Last eye exam less than 12 months ago with Dr.Matthews (?) or Dr.Greven   Dietary issues and exercise activities discussed: Current Exercise Habits: Home exercise routine, Type of exercise: treadmill, Time (Minutes): 20, Frequency (Times/Week): 3, Weekly Exercise (Minutes/Week): 60   Goals Addressed   None    Depression Screen    02/25/2022    8:45 AM 11/20/2021    3:40 PM 09/25/2021    3:15 PM 02/19/2021    2:58 PM 01/25/2021    1:10 PM 05/10/2020    2:01 PM 02/06/2020    3:21 PM  PHQ 2/9 Scores  PHQ - 2 Score  2 0 0 0 0 0  PHQ- 9 Score  11       Exception Documentation Other- indicate reason in comment box          Fall Risk    02/25/2022    8:44 AM 02/07/2022    1:10 PM 11/20/2021    3:06 PM 09/25/2021    3:15 PM 09/13/2021    1:28 PM  [place]  in the past year? 1 1 1 $ 0 0  Number falls in past yr: 0 0 0 0 0  Injury with Fall? 0 0 0 0 0  Risk for fall due to : No Fall Risks No Fall Risks History of fall(s) No Fall Risks No Fall Risks  Risk for fall due to: Comment   Fell over treadmill yesterday    Follow up Falls evaluation completed Falls evaluation completed Falls evaluation completed Falls evaluation completed;Falls prevention discussed Falls evaluation completed    FALL RISK PREVENTION PERTAINING TO THE HOME:  Any stairs in or around the home? No  If so, are there any without handrails? No   Home free of loose throw rugs in walkways, pet beds, electrical cords, etc? Yes  Adequate lighting in your home to reduce risk of falls? Yes   ASSISTIVE DEVICES UTILIZED TO PREVENT FALLS:  Life alert? No  Use of a cane, walker or w/c? No  Grab bars in the bathroom? Yes  Shower chair or bench in shower? Yes  Elevated toilet seat or a handicapped toilet? No   TIMED UP AND GO:  Was the test performed? No .    Cognitive Function:    01/28/2018    3:30 PM 01/21/2016    1:03 PM  MMSE - Mini Mental State Exam  Orientation to time 5 5  Orientation to Place 5 5  Registration 3 3  Attention/ Calculation 5 5  Recall 3 3  Language- name 2 objects 2 2  Language- repeat 1 1  Language- follow 3 step command 3 3  Language- read & follow direction 1 1  Write a sentence 1 1  Copy design 1 1  Total score 30 30        02/25/2022   10:25 AM 02/19/2021    3:01 PM 02/06/2020    3:28 PM 02/02/2019    3:34 PM  6CIT Screen  What Year? 0 points 0 points 0 points 0 points  What month? 0 points 0 points 0 points 0 points  What time? 0 points 0 points 0 points 0 points  Count back from 20 0 points 0 points 0 points 0 points  Months in reverse 0 points 0 points 0 points 0 points  Repeat phrase 0 points 0 points 0 points 2 points  Total Score 0 points 0 points 0 points 2 points    Immunizations Immunization History  Administered Date(s) Administered   Fluad Quad(high Dose 65+) 03/14/2020, 09/23/2021   Hepatitis B, ADULT 06/09/2013, 07/11/2013, 12/05/2013   Influenza Split 09/08/2010, 11/10/2011   Influenza Whole 12/25/2008, 09/24/2009   Influenza, High Dose Seasonal PF 09/04/2020   Influenza,inj,Quad PF,6+ Mos 10/11/2012, 11/02/2013, 11/08/2014, 10/22/2015, 09/24/2016, 10/13/2018, 09/25/2021   Influenza-Unspecified 10/15/2017   PFIZER(Purple Top)SARS-COV-2 Vaccination 03/12/2019, 04/15/2019, 10/30/2019   PNEUMOCOCCAL CONJUGATE-20 04/11/2021   Pneumococcal Conjugate-13 03/28/2014    Pneumococcal Polysaccharide-23 01/24/2010   Tdap 06/22/2020   Zoster Recombinat (Shingrix) 03/11/2018, 11/11/2018    TDAP status: Up to date  Flu Vaccine status: Up to date  Pneumococcal vaccine status: Up to date  Covid-19 vaccine status: Information provided on how to obtain vaccines.   Qualifies for Shingles Vaccine? Yes   Zostavax completed No   Shingrix Completed?: No.    Education has been provided regarding the importance of this vaccine. Patient has been advised to call insurance company to determine out of pocket expense if they have not yet received this vaccine. Advised may also receive vaccine at local pharmacy or  Health Dept. Verbalized acceptance and understanding.  Screening Tests Health Maintenance  Topic Date Due   COVID-19 Vaccine (4 - 2023-24 season) 07/07/2022 (Originally 09/06/2021)   Medicare Annual Wellness (AWV)  02/26/2023   MAMMOGRAM  07/24/2023   DEXA SCAN  07/24/2023   COLONOSCOPY (Pts 45-9yr Insurance coverage will need to be confirmed)  01/09/2025   DTaP/Tdap/Td (2 - Td or Tdap) 06/23/2030   Pneumonia Vaccine 67 Years old  Completed   INFLUENZA VACCINE  Completed   Hepatitis C Screening  Completed   Zoster Vaccines- Shingrix  Completed   HPV VACCINES  Aged Out    Health Maintenance  There are no preventive care reminders to display for this patient.   Colorectal cancer screening: Type of screening: Colonoscopy. Completed 2017. Repeat every 10 years  Mammogram status: Completed 07/23/2021. Repeat every year  Bone Density status: Completed 07/23/2021. Results reflect: Bone density results: OSTEOPOROSIS. Repeat every 2 years.  Lung Cancer Screening: (Low Dose CT Chest recommended if Age 778-80years, 30 pack-year currently smoking OR have quit w/in 15years.) does not qualify.   Lung Cancer Screening Referral: na  Additional Screening:  Hepatitis C Screening: does qualify; Completed 2015   Vision Screening: Recommended annual ophthalmology  exams for early detection of glaucoma and other disorders of the eye. Is the patient up to date with their annual eye exam?  Yes  Who is the provider or what is the name of the office in which the patient attends annual eye exams? Dr MZigmund DanielIf pt is not established with a provider, would they like to be referred to a provider to establish care? No .   Dental Screening: Recommended annual dental exams for proper oral hygiene  Community Resource Referral / Chronic Care Management: CRR required this visit?  No   CCM required this visit?  No      Plan:     I have personally reviewed and noted the following in the patient's chart:   Medical and social history Use of alcohol, tobacco or illicit drugs  Current medications and supplements including opioid prescriptions. Patient is currently taking opioid prescriptions. Information provided to patient regarding non-opioid alternatives. Patient advised to discuss non-opioid treatment plan with their provider. Functional ability and status Nutritional status Physical activity Advanced directives List of other physicians Hospitalizations, surgeries, and ER visits in previous 12 months Vitals Screenings to include cognitive, depression, and falls Referrals and appointments  In addition, I have reviewed and discussed with patient certain preventive protocols, quality metrics, and best practice recommendations. A written personalized care plan for preventive services as well as general preventive health recommendations were provided to patient.     JLauree Chandler NP   02/25/2022   Virtual Visit via Video Note  I connected with BBonney Rousselon 02/25/22 at 10:20 AM EST by a video enabled telemedicine application and verified that I am speaking with the correct person using two identifiers.  Location: Patient: home Provider: [place]   I discussed the limitations of evaluation and management by telemedicine and the  availability of in person appointments. The patient expressed understanding and agreed to proceed.    I discussed the assessment and treatment plan with the patient. The patient was provided an opportunity to ask questions and all were answered. The patient agreed with the plan and demonstrated an understanding of the instructions.   The patient was advised to call back or seek an in-person evaluation if the symptoms worsen or if the condition fails  to improve as anticipated.  I provided 15 minutes of non-face-to-face time during this encounter.  Carlos American. Dewaine Oats, AGNP Avs printed and mailed.

## 2022-02-25 NOTE — Progress Notes (Signed)
   This service is provided via telemedicine  No vital signs collected/recorded due to the encounter was a telemedicine visit.   Location of patient (ex: home, work):  Home  Patient consents to a telephone visit: Yes, see telephone visit dated 02/25/22  Location of the provider (ex: office, home):  [place], [place] Location    Name of any referring provider:  N/A  Names of all persons participating in the telemedicine service and their role in the encounter:  S.Chrae B/CMA, [name], NP, and Patient   Time spent on call:  8 min with medical assistant

## 2022-03-11 ENCOUNTER — Ambulatory Visit: Payer: Medicare Other | Admitting: Internal Medicine

## 2022-03-11 NOTE — Progress Notes (Deleted)
Name: Laurie Gray  MRN/ DOB: [id number], 08/23/1955    Age/ Sex: 67 y.o., female     PCP: Lauree Chandler, NP   Reason for Endocrinology Evaluation: Hx PTC/postoperative hypothyroid     Initial Endocrinology Clinic Visit: 02/04/2017    PATIENT IDENTIFIER: Ms. Laurie Gray is a 67 y.o., female with a past medical history of ***. She has followed with [facility] Endocrinology clinic since   for consultative assistance with management of her PTC/postoperative hypothyroid.   HISTORICAL SUMMARY:  01/2016: thyroidectomy: PTC, 0.9 CM. - FOCAL MICROSCOPIC EXTRATHYROIDAL EXTENSION.  INVOLVES MARGIN.   03/2016: RAI rx with thyrogen, 109 mCi 03/2016: post-therapy scan: uptake in the thyroid bed only.   01/2017: TG=0.1 (Ab neg) 02/2018: TG=0.1 (Ab neg) 10/2018:TG=0.1 (Ab neg) 06/2019: TG=0.1 (Ab neg) 08/2020: TG=0.1 (Ab neg) 04/2021: Tg= 0.1 (Ab neg)    PITUITARY HISTORY: Pt  has pituitary cyst (dx'ed 2005, when MRI showed 3.5 x 6.5 mm cyst; f/u in 2020 was unchanged and again 05/2021 Pituitary function was normal in 2020   Pt also had parathyroidect in 2018.  She takes multivitamin, but not a dedicated Vit-D supplement.    SUBJECTIVE:    Today (03/11/2022):  Ms. Laurie Gray is here for hx of PTC, postoperative hypothyroidism.      HISTORY:  Past Medical History:  Past Medical History:  Diagnosis Date   Anemia    Anxiety    hx of-not on any meds   Arthritis    "shoulders, arms, fingers, neck, back" (01/31/2016)   Chronic cervical pain    Family history of breast cancer    History of bronchitis    History of colon polyps    History of kidney stones    HIV infection ([place]) dx'd 2000   takes odefsey   Hyperlipidemia    takes Pravastatin daily   Hypertension    takes Benicar HCT daily   Hyperthyroidism    Hypothyroidism    Internal hemorrhoids    Joint pain    Migraine    "in the past" (01/31/2016)   Parathyroid adenoma    Pituitary adenoma ([place])    Refusal  of blood transfusions as patient is Jehovah's Witness    "but substitutes are fine". (01/31/2016)   Thyroid cancer ([place]) 12/2015   Upper back pain    Past Surgical History:  Past Surgical History:  Procedure Laterality Date   ABDOMINAL HYSTERECTOMY     BREAST EXCISIONAL BIOPSY Left    no scar seen    BUNIONECTOMY Right    COLONOSCOPY     CYSTOSCOPY     HAMMER TOE SURGERY Left    "middle toe was broke; turned into hammertoe then repaired"   HAMMER TOE SURGERY Bilateral    "both small toes"   PARATHYROIDECTOMY Right 01/31/2016   PARATHYROIDECTOMY Right 01/31/2016   Procedure: PARATHYROIDECTOMY ON RIGHT SIDE;  Surgeon: Izora Gala, MD;  Location: [facility] OR;  Service: ENT;  Laterality: Right;   SALPINGOOPHORECTOMY Bilateral    THYROIDECTOMY N/A 01/31/2016   Procedure: TOTAL THYROIDECTOMY;  Surgeon: Izora Gala, MD;  Location: [place];  Service: ENT;  Laterality: N/A;   TOE SURGERY     bunion   TONSILLECTOMY     as a child   TOTAL THYROIDECTOMY  01/31/2016   TUBAL LIGATION     30 yrs ago   Social History:  reports that she has never smoked. She has never used smokeless tobacco. She reports that she does not currently use alcohol. She  reports that she does not use drugs. Family History:  Family History  Problem Relation Age of Onset   Breast cancer Mother 62   Hypertension Mother    Cancer Mother    Hyperlipidemia Mother    Stroke Mother    Other Father        'vocal dysautonomia'   Breast cancer Sister 49   Thyroid disease Sister    Osteoarthritis Maternal Grandmother    Colon cancer Neg Hx      HOME MEDICATIONS: Allergies as of 03/11/2022       Reactions   Crestor [rosuvastatin Calcium]    myalgias   Pravastatin    myalgias   Sulfonamide Derivatives Swelling   SWELLING REACTION UNSPECIFIED         Medication List        Accurate as of March 11, 2022  7:28 AM. If you have any questions, ask your nurse or doctor.          acetaminophen 325 MG tablet Commonly  known as: TYLENOL Take 650 mg by mouth as needed.   amoxicillin 250 MG capsule Commonly known as: AMOXIL Take 250 mg by mouth daily. For tooth abscess per the dentist   calcium carbonate 600 MG Tabs tablet Commonly known as: OS-CAL Take 1 tablet (600 mg total) by mouth 2 (two) times daily.   dolutegravir-lamiVUDine 50-300 MG tablet Commonly known as: DOVATO Take 1 tablet by mouth daily.   levothyroxine 75 MCG tablet Commonly known as: SYNTHROID Take 1 tablet (75 mcg total) by mouth daily before breakfast.   MULTIVITAMIN ADULTS 50+ PO Take 1 tablet by mouth daily.   olmesartan-hydrochlorothiazide 20-12.5 MG tablet Commonly known as: BENICAR HCT TAKE 1 TABLET BY MOUTH EVERY DAY   pravastatin 40 MG tablet Commonly known as: PRAVACHOL TAKE 1 TABLET BY MOUTH EVERY DAY   traMADol 50 MG tablet Commonly known as: ULTRAM Take 1 tablet (50 mg total) by mouth every 8 (eight) hours as needed.   Ubrelvy 100 MG Tabs Generic drug: Ubrogepant Take 100 mg by mouth as needed.   Vitamin D3 50 MCG (2000 UT) Caps Generic drug: Cholecalciferol Take 1 capsule (2,000 Units total) by mouth daily.          OBJECTIVE:   PHYSICAL EXAM: VS: There were no vitals taken for this visit.   EXAM: General: Pt appears well and is in NAD  Eyes: External eye exam normal without stare, lid lag or exophthalmos.  EOM intact.    Neck: General: Supple without adenopathy. Thyroid: Thyroid size normal.  No goiter or nodules appreciated. No thyroid bruit.  Lungs: Clear with good BS bilat with no rales, rhonchi, or wheezes  Heart: Auscultation: RRR.  Abdomen: Normoactive bowel sounds, soft, nontender, without masses or organomegaly palpable  Extremities:  BL LE: No pretibial edema normal ROM and strength.  Mental Status: Judgment, insight: Intact Orientation: Oriented to time, place, and person Mood and affect: No depression, anxiety, or agitation     DATA REVIEWED: *** MRI Brain  05/29/2021  MRI scan of the brain with and without contrast dated 12 thin sections of the pituitary shows mild changes of chronic small vessel disease and generalized cortical atrophy and a small 3 x 4 mm posterior pituitary microadenoma versus pituitary cyst which appears unchanged compared with previous MRI from 11/08/2018.   ASSESSMENT / PLAN / RECOMMENDATIONS:   ***  Plan: ***    Medications   ***   Signed electronically by: Mack Guise, MD  [facility] Endocrinology  [facility] [address] [place], [place] [address] Phone: [phone] FAX: [phone]      CC: Lauree Chandler, NP [place] [address] Phone: [phone]  Fax: [phone]   Return to Endocrinology clinic as below: Future Appointments  Date Time Provider [place]  03/11/2022  2:00 PM Nikoloz Huy, Melanie Crazier, MD [facility] None  03/12/2022  3:30 PM Dennis Bast, OT [facility]  04/29/2022  1:00 PM Genia Harold, MD [facility] None  08/08/2022  1:00 PM Lauree Chandler, NP [facility] None  03/06/2023  3:00 PM Lauree Chandler, NP [facility] None

## 2022-03-12 ENCOUNTER — Encounter: Payer: Self-pay | Admitting: Occupational Therapy

## 2022-03-12 ENCOUNTER — Ambulatory Visit: Payer: 59 | Attending: Nurse Practitioner | Admitting: Occupational Therapy

## 2022-03-12 DIAGNOSIS — M79641 Pain in right hand: Secondary | ICD-10-CM | POA: Diagnosis not present

## 2022-03-12 DIAGNOSIS — M720 Palmar fascial fibromatosis [Dupuytren]: Secondary | ICD-10-CM | POA: Insufficient documentation

## 2022-03-12 DIAGNOSIS — M79642 Pain in left hand: Secondary | ICD-10-CM | POA: Insufficient documentation

## 2022-03-12 NOTE — Therapy (Addendum)
OUTPATIENT OCCUPATIONAL THERAPY ORTHO EVALUATION  Patient Name: Laurie Gray MRN: [id number] DOB:August 23, 1955, 67 y.o., female Today's Date: 03/12/2022  PCP: Lauree Chandler, NP REFERRING PROVIDER: Lauree Chandler, NP  END OF SESSION: Visit # 1 OT Start Time: [phone] OT Stop Time: [id number] OT Time Calculation: 35 min  Past Medical History:  Diagnosis Date   Anemia    Anxiety    hx of-not on any meds   Arthritis    "shoulders, arms, fingers, neck, back" (01/31/2016)   Chronic cervical pain    Family history of breast cancer    History of bronchitis    History of colon polyps    History of kidney stones    HIV infection ([place]) dx'd 2000   takes odefsey   Hyperlipidemia    takes Pravastatin daily   Hypertension    takes Benicar HCT daily   Hyperthyroidism    Hypothyroidism    Internal hemorrhoids    Joint pain    Migraine    "in the past" (01/31/2016)   Parathyroid adenoma    Pituitary adenoma ([place])    Refusal of blood transfusions as patient is Jehovah's Witness    "but substitutes are fine". (01/31/2016)   Thyroid cancer ([place]) 12/2015   Upper back pain    Past Surgical History:  Procedure Laterality Date   ABDOMINAL HYSTERECTOMY     BREAST EXCISIONAL BIOPSY Left    no scar seen    BUNIONECTOMY Right    COLONOSCOPY     CYSTOSCOPY     HAMMER TOE SURGERY Left    "middle toe was broke; turned into hammertoe then repaired"   HAMMER TOE SURGERY Bilateral    "both small toes"   PARATHYROIDECTOMY Right 01/31/2016   PARATHYROIDECTOMY Right 01/31/2016   Procedure: PARATHYROIDECTOMY ON RIGHT SIDE;  Surgeon: Izora Gala, MD;  Location: [facility];  Service: ENT;  Laterality: Right;   SALPINGOOPHORECTOMY Bilateral    THYROIDECTOMY N/A 01/31/2016   Procedure: TOTAL THYROIDECTOMY;  Surgeon: Izora Gala, MD;  Location: [facility];  Service: ENT;  Laterality: N/A;   TOE SURGERY     bunion   TONSILLECTOMY     as a child   TOTAL THYROIDECTOMY  01/31/2016   TUBAL LIGATION      30 yrs ago   Patient Active Problem List   Diagnosis Date Noted   Fatigue 11/20/2021   Cerumen impaction 08/09/2019   Moderate episode of recurrent major depressive disorder ([place]) 02/28/2019   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   Dupuytren's contracture of both hands 08/19/2018   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   Genetic testing 01/04/2018   Family history of breast cancer    Hypothyroidism 08/06/2017   Arthralgia 02/04/2017   Hyperparathyroidism ([place]) 02/04/2017   Skin lesion of left arm 12/04/2016   Poor dentition 12/04/2016   Adhesive capsulitis of both shoulders 09/24/2016   High risk medication use 09/24/2016   Chronic right shoulder pain 02/18/2016   Chronic left shoulder pain 02/18/2016   Neck pain 02/18/2016   Thyroid cancer ([place]) 01/31/2016   Status post surgery 05/29/2015   Hammertoe 04/02/2015   S/P bunionectomy 11/08/2014   Arthritis 07/07/2014   Osteopenia 04/19/2014   Routine general medical examination at a health care facility 03/28/2014   Depression 11/02/2013   Hyperlipidemia 11/02/2013   Vaginal dysplasia 04/28/2013   Vaginal atrophy 04/06/2013   Right hip pain 08/23/2012   Pain, dental 04/07/2012   Vaginal bleeding 02/17/2012   Headache 12/01/2011   Cellulitis 07/03/2010  Contracture of finger joint 07/03/2010   Disorder of bone and cartilage 09/27/2009   HAND PAIN, RIGHT 09/24/2009   Essential hypertension, benign 07/27/2008   BREAST MASS 07/03/2008   Human immunodeficiency virus (HIV) disease ([place]) 04/03/2008   HSV 04/03/2008    ONSET DATE: 2015  REFERRING DIAG: M72.0 (ICD-10-CM) - Dupuytren's contracture of both hands  THERAPY DIAG:  Pain in left hand  Pain in right hand  Dupuytren's contracture of both hands  Rationale for Evaluation and Treatment: Rehabilitation  SUBJECTIVE:   SUBJECTIVE STATEMENT: Pt states people thinks she is flipping them off if she waves to them due to her finger deformities. She is able to hold her phone in her  hand in a way that she is able to hide her deformities otherwise. Denies family history of Dupuytren's. Sleeps on arms and hands a lot. Has heating pads she uses at home. Pt has tried heat, stretching, wraps in past and has been seen by ortho in last 2-3 years. They recommended surgery. Pt states she is not able to go without use of one of her hands for 12 weeks as she helps with her grandchildren though does acknowledge they are more independent now. Xiaflex injection unsuccessful in L digit 4. Pt reports she used to live in [place] but lives near family in [place].   Pt accompanied by: self  PERTINENT HISTORY:"Dupuytren's contracture of both hands -limited on ADLs due to progressive contractures, has seen orthopedic and not interested in surgery at this time - Ambulatory referral to Occupational Therapy for further evaluation and treatment as needed"  PRECAUTIONS: None  WEIGHT BEARING RESTRICTIONS: No  PAIN:  Are you having pain? Yes: NPRS scale: 3/10 Pain location: Joints Pain description: usual pain, pain helps keep it manageable Aggravating factors: cold, rainy weather, sleep positioning Relieving factors: positioning; medication;   FALLS: Has patient fallen in last 6 months? 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   NEXT MD VISIT: 08/08/2022  OBJECTIVE:   HAND DOMINANCE: Right  ADLs: Overall ADLs: requires increased time and modifications as a whole due to hand deformities. She is unable to wash her hands satisfactorily.   FUNCTIONAL OUTCOME MEASURES: Quick Dash: 27.3% disability   UPPER EXTREMITY ROM:     R: 60% digit ext; full composite flex 2-5; full opposition L: full composite flex and opposition; 80% ext  HAND FUNCTION: Pt unable to lie hands flat on  table. Unable to assume basic grip or pinch secondary to contractures.   SENSATION: Wakes up with numbness B hands  EDEMA: None observable but reports joint swelling  OBSERVATIONS: Cording noted R digits 1-5 most noticeably digit 5 (with contracture) and least affected digit 3 LUE: cording noted digits 1,2,4,and 5; most noticeably digit 1 (with contracture), least affected digit 5 (middle finger unaffected)   TODAY'S TREATMENT:  N/a this visit  PATIENT EDUCATION: Education details: OT Role and POC; recommendation for ortho consult Person educated: Patient Education method: Explanation Education comprehension: verbalized understanding  HOME EXERCISE PROGRAM: N/a  GOALS: N/a - evaluation only  ASSESSMENT:  CLINICAL IMPRESSION: Patient is a 67 y.o. female who was seen today for occupational therapy evaluation for Dupuytren's. Given the severity of her cording and contractures of B digits, pt should consult with orthopedics. The severity of her disease and her understanding of stretching and wrapping/bracing does not make her a good candidate for skilled OT services at this time. Would re-consult should pt be a good candidate and proceed with surgical intervention.   PERFORMANCE DEFICITS: in functional skills including ADLs, IADLs, coordination, dexterity, sensation, ROM, strength, pain, fascial restrictions, flexibility, Fine motor control, and UE functional use.   IMPAIRMENTS: are limiting patient from ADLs, IADLs, rest and sleep, and leisure.   COMORBIDITIES: may have co-morbidities  that affects occupational performance. Patient will benefit from skilled OT to address above impairments and improve overall function.  MODIFICATION OR ASSISTANCE TO COMPLETE EVALUATION: Min-Moderate modification of tasks or assist with assess necessary to complete an  evaluation.  OT OCCUPATIONAL PROFILE AND HISTORY: Problem focused assessment: Including review of records relating to presenting problem.  CLINICAL DECISION MAKING: LOW - limited treatment options, no task modification necessary  REHAB POTENTIAL: Poor Pt is not a good candidate for skilled OT services at this time  EVALUATION COMPLEXITY: Low      PLAN:  OT - evaluation only  RECOMMENDED OTHER SERVICES: consult with orthopedics  CONSULTED AND AGREED WITH PLAN OF CARE: Patient   Dennis Bast, OT 03/12/2022, 4:31 PM

## 2022-03-13 NOTE — Progress Notes (Deleted)
Name: Laurie Gray  MRN/ DOB: [id number], 08-05-1955    Age/ Sex: 67 y.o., female     PCP: Lauree Chandler, NP   Reason for Endocrinology Evaluation: Hx PTC/postoperative hypothyroid     Initial Endocrinology Clinic Visit: 02/04/2017    PATIENT IDENTIFIER: Laurie Gray is a 67 y.o., female with a past medical history of ***. She has followed with [facility] Endocrinology clinic since   for consultative assistance with management of her PTC/postoperative hypothyroid.   HISTORICAL SUMMARY:  01/2016: thyroidectomy: PTC, 0.9 CM. - FOCAL MICROSCOPIC EXTRATHYROIDAL EXTENSION.  INVOLVES MARGIN.   03/2016: RAI rx with thyrogen, 109 mCi 03/2016: post-therapy scan: uptake in the thyroid bed only.   01/2017: TG=0.1 (Ab neg) 02/2018: TG=0.1 (Ab neg) 10/2018:TG=0.1 (Ab neg) 06/2019: TG=0.1 (Ab neg) 08/2020: TG=0.1 (Ab neg) 04/2021: Tg= 0.1 (Ab neg)    PITUITARY HISTORY: Pt  has pituitary cyst (dx'ed 2005, when MRI showed 3.5 x 6.5 mm cyst; f/u in 2020 was unchanged and again 05/2021 Pituitary function was normal in 2020   Pt also had parathyroidect in 2018.  She takes multivitamin, but not a dedicated Vit-D supplement.    SUBJECTIVE:    Today (03/13/2022):  Laurie Gray is here for hx of PTC, postoperative hypothyroidism.      HISTORY:  Past Medical History:  Past Medical History:  Diagnosis Date   Anemia    Anxiety    hx of-not on any meds   Arthritis    "shoulders, arms, fingers, neck, back" (01/31/2016)   Chronic cervical pain    Family history of breast cancer    History of bronchitis    History of colon polyps    History of kidney stones    HIV infection ([place]) dx'd 2000   takes odefsey   Hyperlipidemia    takes Pravastatin daily   Hypertension    takes Benicar HCT daily   Hyperthyroidism    Hypothyroidism    Internal hemorrhoids    Joint pain    Migraine    "in the past" (01/31/2016)   Parathyroid adenoma    Pituitary adenoma ([place])    Refusal  of blood transfusions as patient is Jehovah's Witness    "but substitutes are fine". (01/31/2016)   Thyroid cancer ([place]) 12/2015   Upper back pain    Past Surgical History:  Past Surgical History:  Procedure Laterality Date   ABDOMINAL HYSTERECTOMY     BREAST EXCISIONAL BIOPSY Left    no scar seen    BUNIONECTOMY Right    COLONOSCOPY     CYSTOSCOPY     HAMMER TOE SURGERY Left    "middle toe was broke; turned into hammertoe then repaired"   HAMMER TOE SURGERY Bilateral    "both small toes"   PARATHYROIDECTOMY Right 01/31/2016   PARATHYROIDECTOMY Right 01/31/2016   Procedure: PARATHYROIDECTOMY ON RIGHT SIDE;  Surgeon: Izora Gala, MD;  Location: [facility] OR;  Service: ENT;  Laterality: Right;   SALPINGOOPHORECTOMY Bilateral    THYROIDECTOMY N/A 01/31/2016   Procedure: TOTAL THYROIDECTOMY;  Surgeon: Izora Gala, MD;  Location: [facility];  Service: ENT;  Laterality: N/A;   TOE SURGERY     bunion   TONSILLECTOMY     as a child   TOTAL THYROIDECTOMY  01/31/2016   TUBAL LIGATION     30 yrs ago   Social History:  reports that she has never smoked. She has never used smokeless tobacco. She reports that she does not currently use alcohol. She  reports that she does not use drugs. Family History:  Family History  Problem Relation Age of Onset   Breast cancer Mother 18   Hypertension Mother    Cancer Mother    Hyperlipidemia Mother    Stroke Mother    Other Father        'vocal dysautonomia'   Breast cancer Sister 50   Thyroid disease Sister    Osteoarthritis Maternal Grandmother    Colon cancer Neg Hx      HOME MEDICATIONS: Allergies as of 03/14/2022       Reactions   Crestor [rosuvastatin Calcium]    myalgias   Pravastatin    myalgias   Sulfonamide Derivatives Swelling   SWELLING REACTION UNSPECIFIED         Medication List        Accurate as of March 13, 2022  1:54 PM. If you have any questions, ask your nurse or doctor.          acetaminophen 325 MG tablet Commonly  known as: TYLENOL Take 650 mg by mouth as needed.   amoxicillin 250 MG capsule Commonly known as: AMOXIL Take 250 mg by mouth daily. For tooth abscess per the dentist   calcium carbonate 600 MG Tabs tablet Commonly known as: OS-CAL Take 1 tablet (600 mg total) by mouth 2 (two) times daily.   dolutegravir-lamiVUDine 50-300 MG tablet Commonly known as: DOVATO Take 1 tablet by mouth daily.   levothyroxine 75 MCG tablet Commonly known as: SYNTHROID Take 1 tablet (75 mcg total) by mouth daily before breakfast.   MULTIVITAMIN ADULTS 50+ PO Take 1 tablet by mouth daily.   olmesartan-hydrochlorothiazide 20-12.5 MG tablet Commonly known as: BENICAR HCT TAKE 1 TABLET BY MOUTH EVERY DAY   pravastatin 40 MG tablet Commonly known as: PRAVACHOL TAKE 1 TABLET BY MOUTH EVERY DAY   traMADol 50 MG tablet Commonly known as: ULTRAM Take 1 tablet (50 mg total) by mouth every 8 (eight) hours as needed.   Ubrelvy 100 MG Tabs Generic drug: Ubrogepant Take 100 mg by mouth as needed.   Vitamin D3 50 MCG (2000 UT) Caps Generic drug: Cholecalciferol Take 1 capsule (2,000 Units total) by mouth daily.          OBJECTIVE:   PHYSICAL EXAM: VS: There were no vitals taken for this visit.   EXAM: General: Pt appears well and is in NAD  Eyes: External eye exam normal without stare, lid lag or exophthalmos.  EOM intact.    Neck: General: Supple without adenopathy. Thyroid: Thyroid size normal.  No goiter or nodules appreciated. No thyroid bruit.  Lungs: Clear with good BS bilat with no rales, rhonchi, or wheezes  Heart: Auscultation: RRR.  Abdomen: Normoactive bowel sounds, soft, nontender, without masses or organomegaly palpable  Extremities:  BL LE: No pretibial edema normal ROM and strength.  Mental Status: Judgment, insight: Intact Orientation: Oriented to time, place, and person Mood and affect: No depression, anxiety, or agitation     DATA REVIEWED: *** MRI Brain  05/29/2021  MRI scan of the brain with and without contrast dated 12 thin sections of the pituitary shows mild changes of chronic small vessel disease and generalized cortical atrophy and a small 3 x 4 mm posterior pituitary microadenoma versus pituitary cyst which appears unchanged compared with previous MRI from 11/08/2018.   ASSESSMENT / PLAN / RECOMMENDATIONS:   ***  Plan: ***    Medications   ***   Signed electronically by: Mack Guise, MD  [facility] Endocrinology  [facility] [address] [place], [place] [address] Phone: [phone] FAX: [phone]      CC: Lauree Chandler, NP [place] [address] Phone: [phone]  Fax: [phone]   Return to Endocrinology clinic as below: Future Appointments  Date Time Provider [place]  03/14/2022 11:30 AM Daisy Lites, Melanie Crazier, MD [facility] None  04/29/2022  1:00 PM Genia Harold, MD [facility] None  08/08/2022  1:00 PM Lauree Chandler, NP [facility] None  03/06/2023  3:00 PM Lauree Chandler, NP [facility] None

## 2022-03-14 ENCOUNTER — Ambulatory Visit: Payer: Medicare Other | Admitting: Internal Medicine

## 2022-03-20 ENCOUNTER — Other Ambulatory Visit: Payer: Self-pay | Admitting: Nurse Practitioner

## 2022-03-20 DIAGNOSIS — M2550 Pain in unspecified joint: Secondary | ICD-10-CM

## 2022-03-21 NOTE — Telephone Encounter (Signed)
Pharmacy requested refill.  Epic LR: 02/20/2022 Contract Date: 04/02/2021  Pended Rx and sent to [facility] for approval.

## 2022-04-12 ENCOUNTER — Other Ambulatory Visit: Payer: Self-pay | Admitting: Internal Medicine

## 2022-04-17 ENCOUNTER — Other Ambulatory Visit: Payer: Self-pay | Admitting: Nurse Practitioner

## 2022-04-17 DIAGNOSIS — M2550 Pain in unspecified joint: Secondary | ICD-10-CM

## 2022-04-17 NOTE — Telephone Encounter (Signed)
Pharmacy requested refill.  Epic LR: 03/21/2022 Contract Date: 04/02/2021. Note added to upcoming appointment to update  Pended Rx and sent to [facility] for approval.

## 2022-04-28 ENCOUNTER — Telehealth: Payer: Self-pay | Admitting: Psychiatry

## 2022-04-28 NOTE — Telephone Encounter (Signed)
LVM and sent mychart msg informing pt of cx of tomorrow's appt- MD on maternity leave

## 2022-04-29 ENCOUNTER — Ambulatory Visit: Payer: 59 | Admitting: Psychiatry

## 2022-05-06 ENCOUNTER — Ambulatory Visit: Payer: Medicare Other | Admitting: Psychiatry

## 2022-05-16 ENCOUNTER — Other Ambulatory Visit: Payer: Self-pay | Admitting: Nurse Practitioner

## 2022-05-16 ENCOUNTER — Other Ambulatory Visit: Payer: Self-pay

## 2022-05-16 DIAGNOSIS — M2550 Pain in unspecified joint: Secondary | ICD-10-CM

## 2022-05-16 NOTE — Telephone Encounter (Signed)
Patient called and states that she needs refill on medication Tramadol 50mg . Patient medication last refilled 04/18/2022. Patient has Non Opioid Contract dated 04/02/2021. Patient has upcoming appointment 08/08/2022. Update Contract added to patient appointment note. Medication pend and sent to PCP Janyth Contes Janene Harvey, NP for approval.

## 2022-05-16 NOTE — Telephone Encounter (Signed)
Please refer to telephone note.  

## 2022-05-22 ENCOUNTER — Other Ambulatory Visit: Payer: Self-pay | Admitting: Internal Medicine

## 2022-05-26 NOTE — Progress Notes (Unsigned)
Name: Laurie Gray  MRN/ DOB: [id number], 19-Dec-1955    Age/ Sex: 67 y.o., female     PCP: Sharon Seller, NP   Reason for Endocrinology Evaluation: Postoperative hypothyroid/PTC     Initial Endocrinology Clinic Visit: 02/04/2017    PATIENT IDENTIFIER: Laurie Gray is a 67 y.o., female with a past medical history of hypothyroid, dyslipidemia, HTN. She has followed with [facility] since 02/04/2017 for consultative assistance with management of her postoperative hypothyroid/PTC.   HISTORICAL SUMMARY:    Pt returns for f/u stage 1 PTC, with this chronology: 1/18: thyroidectomy: PTC, 0.9 CM. - FOCAL MICROSCOPIC EXTRATHYROIDAL EXTENSION.  INVOLVES MARGIN.   3/18: RAI rx with thyrogen, 109 mCi 3/18: post-therapy scan: uptake in the thyroid bed only.   1/19: TG=0.1 (Ab neg) 2/20: TG=0.1 (Ab neg) 10/20: TG=0.1 (Ab neg) 6/21  TG=0.1 (Ab neg) 8/22  TG=0.1 (Ab neg) 04/2021 TG=0.1 (Ab neg)   Pt also has pituitary cyst (dx'ed 2005, when MRI showed 3.5 x 6.5 mm cyst; f/u in 2020 was unchanged; pit function was normal in 2020).     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  pt states she feels well in general.    She is S/P parathyroidectomy 2018 due to primary hyperparathyroidism with normalization of serum calcium  Sister with thyroid goiter    Patient was followed by Dr. Everardo All from 2019 until 04/2021  SUBJECTIVE:    Today (05/26/2022):  Laurie Gray is here for postoperative hypothyroidism and hx  of PTC.     Denies local neck swelling  No palpitations  Denies  diarrhea but has occasional constipation  Denies tremors  She has been tired lately and has no taste , had dental work a few months ago , had COVID in 12/2021 Does not sleep well at night , melatonin made her feel funny  Hair has been shedding and has started  Biotin , but held it a week ago   Levothyroxine 75 mcg daily  Oscal D 600 mg BID  Vitamin D 2000 iu  daily      HISTORY:  Past Medical History:  Past Medical History:  Diagnosis Date   Anemia    Anxiety    hx of-not on any meds   Arthritis    "shoulders, arms, fingers, neck, back" (01/31/2016)   Chronic cervical pain    Family history of breast cancer    History of bronchitis    History of colon polyps    History of kidney stones    HIV infection (HCC) dx'd 2000   takes odefsey   Hyperlipidemia    takes Pravastatin daily   Hypertension    takes Benicar HCT daily   Hyperthyroidism    Hypothyroidism    Internal hemorrhoids    Joint pain    Migraine    "in the past" (01/31/2016)   Parathyroid adenoma    Pituitary adenoma (HCC)    Refusal of blood transfusions as patient is Jehovah's Witness    "but substitutes are fine". (01/31/2016)   Thyroid cancer (HCC) 12/2015   Upper back pain    Past Surgical History:  Past Surgical History:  Procedure Laterality Date   ABDOMINAL HYSTERECTOMY     BREAST EXCISIONAL BIOPSY Left    no scar seen    BUNIONECTOMY Right    COLONOSCOPY     CYSTOSCOPY     HAMMER TOE SURGERY Left    "middle toe was broke; turned into hammertoe then repaired"  HAMMER TOE SURGERY Bilateral    "both small toes"   PARATHYROIDECTOMY Right 01/31/2016   PARATHYROIDECTOMY Right 01/31/2016   Procedure: PARATHYROIDECTOMY ON RIGHT SIDE;  Surgeon: Serena Colonel, MD;  Location: [facility] OR;  Service: ENT;  Laterality: Right;   SALPINGOOPHORECTOMY Bilateral    THYROIDECTOMY N/A 01/31/2016   Procedure: TOTAL THYROIDECTOMY;  Surgeon: Serena Colonel, MD;  Location: [facility] OR;  Service: ENT;  Laterality: N/A;   TOE SURGERY     bunion   TONSILLECTOMY     as a child   TOTAL THYROIDECTOMY  01/31/2016   TUBAL LIGATION     30 yrs ago   Social History:  reports that she has never smoked. She has never used smokeless tobacco. She reports that she does not currently use alcohol. She reports that she does not use drugs. Family History:  Family History  Problem Relation Age of Onset    Breast cancer Mother 47   Hypertension Mother    Cancer Mother    Hyperlipidemia Mother    Stroke Mother    Other Father        'vocal dysautonomia'   Breast cancer Sister 74   Thyroid disease Sister    Osteoarthritis Maternal Grandmother    Colon cancer Neg Hx      HOME MEDICATIONS: Allergies as of 05/27/2022       Reactions   Crestor [rosuvastatin Calcium]    myalgias   Pravastatin    myalgias   Sulfonamide Derivatives Swelling   SWELLING REACTION UNSPECIFIED         Medication List        Accurate as of May 26, 2022 12:32 PM. If you have any questions, ask your nurse or doctor.          acetaminophen 325 MG tablet Commonly known as: TYLENOL Take 650 mg by mouth as needed.   amoxicillin 250 MG capsule Commonly known as: AMOXIL Take 250 mg by mouth daily. For tooth abscess per the dentist   calcium carbonate 600 MG Tabs tablet Commonly known as: OS-CAL Take 1 tablet (600 mg total) by mouth 2 (two) times daily.   dolutegravir-lamiVUDine 50-300 MG tablet Commonly known as: DOVATO Take 1 tablet by mouth daily.   levothyroxine 75 MCG tablet Commonly known as: SYNTHROID TAKE 1 TABLET BY MOUTH EVERY DAY BEFORE BREAKFAST   MULTIVITAMIN ADULTS 50+ PO Take 1 tablet by mouth daily.   olmesartan-hydrochlorothiazide 20-12.5 MG tablet Commonly known as: BENICAR HCT TAKE 1 TABLET BY MOUTH EVERY DAY   pravastatin 40 MG tablet Commonly known as: PRAVACHOL TAKE 1 TABLET BY MOUTH EVERY DAY   traMADol 50 MG tablet Commonly known as: ULTRAM TAKE 1 TABLET BY MOUTH EVERY 8 HOURS AS NEEDED   Ubrelvy 100 MG Tabs Generic drug: Ubrogepant Take 100 mg by mouth as needed.   Vitamin D3 50 MCG (2000 UT) capsule Take 1 capsule (2,000 Units total) by mouth daily.          OBJECTIVE:   PHYSICAL EXAM: VS: BP 120/80 (BP Location: Left Arm, Patient Position: Sitting, Cuff Size: Small)   Pulse 69   Ht 5\' 5"  (1.651 m)   Wt 130 lb (59 kg)   SpO2 99%   BMI  21.63 kg/m    EXAM: General: Pt appears well and is in NAD  Neck: General: Supple without adenopathy. Thyroid: No goiter or nodules appreciated.  Lungs: Clear with good BS bilat   Heart: Auscultation: RRR.  Abdomen: soft, nontender  Extremities:  BL LE: No pretibial edema  Mental Status: Judgment, insight: Intact Orientation: Oriented to time, place, and person Mood and affect: No depression, anxiety, or agitation     DATA REVIEWED:  Latest Reference Range & Units 04/23/21 14:33  TSH 0.35 - 5.50 uIU/mL 0.93  T4,Free(Direct) 0.60 - 1.60 ng/dL 1.61  Thyroglobulin ng/mL 0.1 (L)  Thyroglobulin Ab < or = 1 IU/mL <1  (L): Data is abnormally low  Pathology report 01/31/2016 Specimen: Total thyroid. Procedure (including lymph node sampling if applicable): Total thyroidectomy. Specimen Integrity (intact/fragmented): Intact. Tumor focality: Unifocal. Dominant tumor: Maximum tumor size (cm): 0.9 cm Tumor laterality: Right lobe. Histologic type (including subtype and/or unique features as applicable): Papillary thyroid carcinoma, classic/usual type. Tumor capsule: None. Extrathyroidal extension: Microscopic extension into peri-thyroidal soft tissue. Capsular invasion with degree of invasion if present: N/A. Margins: Focally positive. Lymphatic or vascular invasion: Not identified. Lymph nodes: N/A. TNM code: pT1a, pNX Non-neoplastic thyroid: Nodular hyperplasia. Comments: There is focal microscopic extrathyroidal extension in the area of the positive margin   Old records , labs and images have been reviewed.   ASSESSMENT / PLAN / RECOMMENDATIONS:   Postoperative Hypothyroidism  -Patient with fatigue, otherwise euthyroid - Pt educated extensively on the correct way to take levothyroxine (first thing in the morning with water, 30 minutes before eating or taking other medications). - Pt encouraged to double dose the following day if she were to miss a dose given long half-life  of levothyroxine. -Patient will return for repeat TSH as we do not have a phlebotomist today  Medications  Continue levothyroxine 75 mcg daily    2. Pre-diabetes   -Discussed importance of low-carb diet and avoiding sugar sweetened beverages -She is not on any medications -Patient will return for repeat A1c  3. Hx PTC  -She is s/p total thyroidectomy in 2018 -She is s/p RAI in 2018 -Patient is currently at low risk for recurrence with TSH goal of 0.5-2.0 u IU/mL -Patient will return for TG and TG AB   Follow-up in 1 year Signed electronically by: Lyndle Herrlich, MD  [facility]  [facility] [address] [place], [place] [address] Phone: [phone] FAX: [phone]      CC: Sharon Seller, NP [address] [place] [address] Phone: [phone]  Fax: [phone]   Return to Endocrinology clinic as below: Future Appointments  Date Time Provider Department Center  05/27/2022 12:10 PM Darling Cieslewicz, Konrad Dolores, MD [facility] None  08/08/2022  1:00 PM Sharon Seller, NP [facility] None  03/06/2023  3:00 PM Sharon Seller, NP [facility] None

## 2022-05-27 ENCOUNTER — Other Ambulatory Visit: Payer: Self-pay | Admitting: Nurse Practitioner

## 2022-05-27 ENCOUNTER — Ambulatory Visit (INDEPENDENT_AMBULATORY_CARE_PROVIDER_SITE_OTHER): Payer: 59 | Admitting: Internal Medicine

## 2022-05-27 ENCOUNTER — Telehealth: Payer: 59

## 2022-05-27 ENCOUNTER — Encounter: Payer: Self-pay | Admitting: Internal Medicine

## 2022-05-27 VITALS — BP 120/80 | HR 69 | Ht 65.0 in | Wt 130.0 lb

## 2022-05-27 DIAGNOSIS — E890 Postprocedural hypothyroidism: Secondary | ICD-10-CM | POA: Diagnosis not present

## 2022-05-27 DIAGNOSIS — Z85850 Personal history of malignant neoplasm of thyroid: Secondary | ICD-10-CM

## 2022-05-27 DIAGNOSIS — R7303 Prediabetes: Secondary | ICD-10-CM

## 2022-05-27 DIAGNOSIS — Z1231 Encounter for screening mammogram for malignant neoplasm of breast: Secondary | ICD-10-CM

## 2022-05-27 NOTE — Telephone Encounter (Signed)
Patient called requesting order for diagnostic mammogram due to some tenderness and discomfort in left breast and family history of breast cancer.  Message sent to Abbey Chatters, NP

## 2022-05-27 NOTE — Telephone Encounter (Signed)
Will need in office appt to order

## 2022-05-27 NOTE — Telephone Encounter (Signed)
Patient made appointment with Richarda Blade, NP for 05/28/22

## 2022-05-27 NOTE — Patient Instructions (Signed)

## 2022-05-27 NOTE — Telephone Encounter (Signed)
Prescription refilled by PCP Eubanks, Jessica K, NP  

## 2022-05-28 ENCOUNTER — Ambulatory Visit (INDEPENDENT_AMBULATORY_CARE_PROVIDER_SITE_OTHER): Payer: 59 | Admitting: Adult Health

## 2022-05-28 VITALS — BP 110/70 | HR 72 | Temp 97.3°F | Resp 18 | Ht 65.0 in | Wt 130.0 lb

## 2022-05-28 DIAGNOSIS — Z1231 Encounter for screening mammogram for malignant neoplasm of breast: Secondary | ICD-10-CM | POA: Diagnosis not present

## 2022-05-28 DIAGNOSIS — N644 Mastodynia: Secondary | ICD-10-CM | POA: Diagnosis not present

## 2022-05-28 NOTE — Progress Notes (Signed)
Location:  [facility]   Place of Service:   clinic    CODE STATUS: full   Allergies  Allergen Reactions   Crestor [Rosuvastatin Calcium]     myalgias   Pravastatin     myalgias   Sulfonamide Derivatives Swelling    SWELLING REACTION UNSPECIFIED     Chief Complaint  Patient presents with   Acute Visit    Patient is being seen for left breast tenderness/ pain Patient would also like mammogram     HPI:  She has been having left breast tenderness for the past several months. When she went to schedule a diagnostic mammogram she was told she would need to be seen in the office. She does have some enlarged lymph nodes on the left side. She has a strong family history of breast cancer.   Past Medical History:  Diagnosis Date   Anemia    Anxiety    hx of-not on any meds   Arthritis    "shoulders, arms, fingers, neck, back" (01/31/2016)   Chronic cervical pain    Family history of breast cancer    History of bronchitis    History of colon polyps    History of kidney stones    HIV infection (HCC) dx'd 2000   takes odefsey   Hyperlipidemia    takes Pravastatin daily   Hypertension    takes Benicar HCT daily   Hyperthyroidism    Hypothyroidism    Internal hemorrhoids    Joint pain    Migraine    "in the past" (01/31/2016)   Parathyroid adenoma    Pituitary adenoma (HCC)    Refusal of blood transfusions as patient is Jehovah's Witness    "but substitutes are fine". (01/31/2016)   Thyroid cancer (HCC) 12/2015   Upper back pain     Past Surgical History:  Procedure Laterality Date   ABDOMINAL HYSTERECTOMY     BREAST EXCISIONAL BIOPSY Left    no scar seen    BUNIONECTOMY Right    COLONOSCOPY     CYSTOSCOPY     HAMMER TOE SURGERY Left    "middle toe was broke; turned into hammertoe then repaired"   HAMMER TOE SURGERY Bilateral    "both small toes"   PARATHYROIDECTOMY Right 01/31/2016   PARATHYROIDECTOMY Right 01/31/2016   Procedure: PARATHYROIDECTOMY  ON RIGHT SIDE;  Surgeon: Serena Colonel, MD;  Location: [facility] OR;  Service: ENT;  Laterality: Right;   SALPINGOOPHORECTOMY Bilateral    THYROIDECTOMY N/A 01/31/2016   Procedure: TOTAL THYROIDECTOMY;  Surgeon: Serena Colonel, MD;  Location: [facility] OR;  Service: ENT;  Laterality: N/A;   TOE SURGERY     bunion   TONSILLECTOMY     as a child   TOTAL THYROIDECTOMY  01/31/2016   TUBAL LIGATION     30 yrs ago    Social History   Socioeconomic History   Marital status: Widowed    Spouse name: Not on file   Number of children: Not on file   Years of education: Not on file   Highest education level: Some college, no degree  Occupational History   Not on file  Tobacco Use   Smoking status: Never   Smokeless tobacco: Never  Vaping Use   Vaping Use: Never used  Substance and Sexual Activity   Alcohol use: Not Currently    Alcohol/week: 0.0 standard drinks of alcohol    Comment: 01/31/2016 "might go out to eat and have a drink once/month; no more  than that"   Drug use: No   Sexual activity: Not Currently    Partners: Male    Comment: declined condoms  Other Topics Concern   Not on file  Social History Narrative   Normal diet   Drinks Caffeine- coffee/coke/chocolate    Lives in an apartment/ third level/1 person/1 pet   Past profession- Market researcher, cosmetologist   Exercises 3-4 x weekly (cardio and weights)    Right handed    Social Determinants of Health   Financial Resource Strain: Medium Risk (05/28/2022)   Overall Financial Resource Strain (CARDIA)    Difficulty of Paying Living Expenses: Somewhat hard  Food Insecurity: Food Insecurity Present (05/28/2022)   Hunger Vital Sign    Worried About Running Out of Food in the Last Year: Sometimes true    Ran Out of Food in the Last Year: Sometimes true  Transportation Needs: No Transportation Needs (05/28/2022)   PRAPARE - Administrator, Civil Service (Medical): No    Lack of Transportation (Non-Medical): No  Physical Activity:  Insufficiently Active (05/28/2022)   Exercise Vital Sign    Days of Exercise per Week: 2 days    Minutes of Exercise per Session: 30 min  Stress: No Stress Concern Present (05/28/2022)   [employer] of Occupational Health - Occupational Stress Questionnaire    Feeling of Stress : Only a little  Social Connections: Moderately Integrated (05/28/2022)   Social Connection and Isolation Panel [NHANES]    Frequency of Communication with Friends and Family: More than three times a week    Frequency of Social Gatherings with Friends and Family: Three times a week    Attends Religious Services: More than 4 times per year    Active Member of Clubs or Organizations: Yes    Attends Banker Meetings: More than 4 times per year    Marital Status: Widowed  Intimate Partner Violence: Not At Risk (01/21/2017)   Humiliation, Afraid, Rape, and Kick questionnaire    Fear of Current or Ex-Partner: No    Emotionally Abused: No    Physically Abused: No    Sexually Abused: No   Family History  Problem Relation Age of Onset   Breast cancer Mother 1   Hypertension Mother    Cancer Mother    Hyperlipidemia Mother    Stroke Mother    Other Father        'vocal dysautonomia'   Breast cancer Sister 34   Thyroid disease Sister    Osteoarthritis Maternal Grandmother    Colon cancer Neg Hx       VITAL SIGNS BP 110/70   Pulse 72   Temp (!) 97.3 F (36.3 C)   Resp 18   Ht 5\' 5"  (1.651 m)   Wt 130 lb (59 kg)   SpO2 98%   BMI 21.63 kg/m   Outpatient Encounter Medications as of 05/28/2022  Medication Sig   acetaminophen (TYLENOL) 325 MG tablet Take 650 mg by mouth as needed.   calcium carbonate (OS-CAL) 600 MG TABS tablet Take 1 tablet (600 mg total) by mouth 2 (two) times daily.   Cholecalciferol (VITAMIN D3) 50 MCG (2000 UT) capsule Take 1 capsule (2,000 Units total) by mouth daily.   dolutegravir-lamiVUDine (DOVATO) 50-300 MG tablet Take 1 tablet by mouth daily.   levothyroxine  (SYNTHROID) 75 MCG tablet TAKE 1 TABLET BY MOUTH EVERY DAY BEFORE BREAKFAST   Multiple Vitamins-Minerals (MULTIVITAMIN ADULTS 50+ PO) Take 1 tablet by mouth daily.  olmesartan-hydrochlorothiazide (BENICAR HCT) 20-12.5 MG tablet TAKE 1 TABLET BY MOUTH EVERY DAY   pravastatin (PRAVACHOL) 40 MG tablet TAKE 1 TABLET BY MOUTH EVERY DAY   traMADol (ULTRAM) 50 MG tablet TAKE 1 TABLET BY MOUTH EVERY 8 HOURS AS NEEDED   Ubrogepant (UBRELVY) 100 MG TABS Take 100 mg by mouth as needed.   No facility-administered encounter medications on file as of 05/28/2022.     SIGNIFICANT DIAGNOSTIC EXAMS  Review of Systems  Constitutional:  Negative for malaise/fatigue.  Respiratory:  Negative for cough and shortness of breath.   Cardiovascular:  Negative for chest pain, palpitations and leg swelling.  Gastrointestinal:  Negative for abdominal pain, constipation and heartburn.  Musculoskeletal:  Negative for back pain, joint pain and myalgias.       Left breast pain   Skin: Negative.   Neurological:  Negative for dizziness.  Psychiatric/Behavioral:  The patient is not nervous/anxious.    Physical Exam Constitutional:      General: She is not in acute distress.    Appearance: She is well-developed. She is not diaphoretic.  Neck:     Thyroid: No thyromegaly.  Pulmonary:     Effort: Pulmonary effort is normal. No respiratory distress.  Chest:  Breasts:    Right: Normal.     Left: Normal.     Comments: Breasts are nodular  Abdominal:     General: Bowel sounds are normal. There is no distension.     Palpations: Abdomen is soft.     Tenderness: There is no abdominal tenderness.  Musculoskeletal:        General: Normal range of motion.     Right lower leg: No edema.     Left lower leg: No edema.  Lymphadenopathy:     Cervical: No cervical adenopathy.     Upper Body:     Left upper body: Axillary adenopathy present.  Skin:    General: Skin is warm and dry.  Neurological:     Mental Status: She is  alert and oriented to person, place, and time.  Psychiatric:        Mood and Affect: Mood normal.       ASSESSMENT/ PLAN:  TODAY  Left breast pain: will setup a diagnostic mammogram as soon as possible.    Synthia Innocent NP [facility]   call [phone]

## 2022-05-30 ENCOUNTER — Other Ambulatory Visit (INDEPENDENT_AMBULATORY_CARE_PROVIDER_SITE_OTHER): Payer: 59

## 2022-05-30 DIAGNOSIS — R7303 Prediabetes: Secondary | ICD-10-CM | POA: Diagnosis not present

## 2022-05-30 DIAGNOSIS — Z85850 Personal history of malignant neoplasm of thyroid: Secondary | ICD-10-CM

## 2022-05-30 DIAGNOSIS — E890 Postprocedural hypothyroidism: Secondary | ICD-10-CM

## 2022-05-30 NOTE — Addendum Note (Signed)
Addended by: Clearnce Sorrel on: 05/30/2022 03:36 PM   Modules accepted: Orders

## 2022-05-30 NOTE — Addendum Note (Signed)
Addended by: Aliyana Dlugosz S on: 05/30/2022 03:36 PM   Modules accepted: Orders  

## 2022-06-03 LAB — THYROGLOBULIN ANTIBODY: Thyroglobulin Ab: <1 IU/mL (ref <=1)

## 2022-06-03 LAB — HEMOGLOBIN A1C
Hgb A1c MFr Bld: 6 % of total Hgb — ABNORMAL HIGH (ref <5.7)
Mean Plasma Glucose: 126 mg/dL
eAG (mmol/L): 7 mmol/L

## 2022-06-03 LAB — THYROGLOBULIN LEVEL: Thyroglobulin: 0.1 ng/mL — ABNORMAL LOW

## 2022-06-03 LAB — TSH: TSH: 0.11 mIU/L — ABNORMAL LOW (ref 0.40–4.50)

## 2022-06-04 NOTE — Addendum Note (Signed)
Addended by: Scarlette Shorts on: 06/04/2022 04:13 PM   Modules accepted: Orders

## 2022-06-13 ENCOUNTER — Other Ambulatory Visit: Payer: Self-pay | Admitting: Nurse Practitioner

## 2022-06-13 ENCOUNTER — Other Ambulatory Visit: Payer: Self-pay | Admitting: Internal Medicine

## 2022-06-13 DIAGNOSIS — M2550 Pain in unspecified joint: Secondary | ICD-10-CM

## 2022-06-13 NOTE — Telephone Encounter (Signed)
Patient is requesting a refill of the following medications: Requested Prescriptions   Pending Prescriptions Disp Refills   traMADol (ULTRAM) 50 MG tablet [Pharmacy Med Name: TRAMADOL HCL 50 MG TABLET] 90 tablet 0    Sig: TAKE 1 TABLET BY MOUTH EVERY 8 HOURS AS NEEDED    Date of last refill: 05/16/2022  Refill amount: 0  Treatment agreement date: 04/02/2021

## 2022-06-23 ENCOUNTER — Telehealth: Payer: Self-pay | Admitting: Internal Medicine

## 2022-06-23 ENCOUNTER — Encounter: Payer: Self-pay | Admitting: Adult Health

## 2022-06-23 ENCOUNTER — Other Ambulatory Visit: Payer: Self-pay | Admitting: Adult Health

## 2022-06-23 DIAGNOSIS — N63 Unspecified lump in unspecified breast: Secondary | ICD-10-CM

## 2022-06-23 NOTE — Telephone Encounter (Signed)
Patient was told By Dr. Lonzo Cloud to be see within the next 2 weeks patient scheduled for November, due to no openings. Patient put on wait list..please advise if anything else needs to be done.

## 2022-06-23 NOTE — Telephone Encounter (Signed)
Dr Lonzo Cloud is it ok for patient to wait till November to be seen again, Please advise?

## 2022-06-25 NOTE — Telephone Encounter (Signed)
Pt has been notified that she is supposed to have labs done in 2 months and a f/u in 1 year per Dr. Lonzo Cloud.

## 2022-06-25 NOTE — Telephone Encounter (Signed)
Please help with scheduling.

## 2022-07-01 ENCOUNTER — Other Ambulatory Visit: Payer: Self-pay | Admitting: Nurse Practitioner

## 2022-07-01 ENCOUNTER — Other Ambulatory Visit: Payer: Self-pay | Admitting: Adult Health

## 2022-07-01 DIAGNOSIS — N63 Unspecified lump in unspecified breast: Secondary | ICD-10-CM

## 2022-07-01 DIAGNOSIS — N644 Mastodynia: Secondary | ICD-10-CM

## 2022-07-11 ENCOUNTER — Other Ambulatory Visit: Payer: Self-pay | Admitting: Nurse Practitioner

## 2022-07-11 ENCOUNTER — Telehealth: Payer: Self-pay

## 2022-07-11 DIAGNOSIS — M2550 Pain in unspecified joint: Secondary | ICD-10-CM

## 2022-07-11 NOTE — Telephone Encounter (Signed)
Patient is requesting a refill of the following medications: Requested Prescriptions   Pending Prescriptions Disp Refills   traMADol (ULTRAM) 50 MG tablet [Pharmacy Med Name: TRAMADOL HCL 50 MG TABLET] 90 tablet 0    Sig: TAKE 1 TABLET BY MOUTH EVERY 8 HOURS AS NEEDED    Date of last refill: 06/13/2022  Refill amount: 90  Treatment agreement date: outdated, notation made on pending appointment 08/08/2022

## 2022-07-12 ENCOUNTER — Other Ambulatory Visit: Payer: Self-pay | Admitting: Nurse Practitioner

## 2022-07-12 DIAGNOSIS — M2550 Pain in unspecified joint: Secondary | ICD-10-CM

## 2022-07-14 ENCOUNTER — Other Ambulatory Visit: Payer: Self-pay | Admitting: Nurse Practitioner

## 2022-07-14 NOTE — Telephone Encounter (Signed)
Rx sent 

## 2022-07-14 NOTE — Telephone Encounter (Signed)
Patient has request refill on medication that has Allergy Contraindications. Medication pend and sent to PCP Janyth Contes Janene Harvey, NP for approval.

## 2022-07-14 NOTE — Telephone Encounter (Signed)
Patient has request refill on medication Tramadol 06/13/2022. Patient has Non Opioid Contract dated 04/09/2021. Patient medication pend and sent to PCP Janyth Contes Janene Harvey, NP for approval.

## 2022-07-25 ENCOUNTER — Other Ambulatory Visit: Payer: 59

## 2022-07-29 ENCOUNTER — Other Ambulatory Visit (INDEPENDENT_AMBULATORY_CARE_PROVIDER_SITE_OTHER): Payer: 59

## 2022-07-29 ENCOUNTER — Other Ambulatory Visit: Payer: 59

## 2022-07-29 DIAGNOSIS — E890 Postprocedural hypothyroidism: Secondary | ICD-10-CM | POA: Diagnosis not present

## 2022-07-29 LAB — TSH: TSH: 0.22 u[IU]/mL — ABNORMAL LOW (ref 0.35–5.50)

## 2022-07-30 ENCOUNTER — Telehealth: Payer: Self-pay | Admitting: Internal Medicine

## 2022-07-30 DIAGNOSIS — E890 Postprocedural hypothyroidism: Secondary | ICD-10-CM

## 2022-07-30 NOTE — Telephone Encounter (Signed)
Patient has been advised and would like to speak with you further as she doesn't understand why she is decreasing the medication. Patient states that she did take the medication 7 days a week a few times when she forgot.

## 2022-07-30 NOTE — Telephone Encounter (Signed)
Please let the patient know that her thyroid test continues to show that she is on too much levothyroxine.  Her test is better but still on too much.   Please verify that she has been taking levothyroxine 6 days out of the week (skips 1 day a week)  If so, then I will need to decrease her levothyroxine dose to 50 mcg daily from now on (so she will take it 7 days a week)  Please schedule her for lab recheck in 2 months

## 2022-07-31 MED ORDER — LEVOTHYROXINE SODIUM 50 MCG PO TABS: 50.0000 ug | ORAL_TABLET | Freq: Every day | ORAL | 3 refills | Status: DC

## 2022-07-31 NOTE — Addendum Note (Signed)
Addended by: Scarlette Shorts on: 07/31/2022 09:13 AM   Modules accepted: Orders

## 2022-07-31 NOTE — Telephone Encounter (Signed)
Patient advise and would like the prescription sent to pharmacy

## 2022-08-06 ENCOUNTER — Other Ambulatory Visit: Payer: Self-pay | Admitting: Nurse Practitioner

## 2022-08-06 ENCOUNTER — Ambulatory Visit
Admission: RE | Admit: 2022-08-06 | Discharge: 2022-08-06 | Disposition: A | Discharge status: Home / Self Care | Payer: 59 | Source: Ambulatory Visit | Attending: Nurse Practitioner | Admitting: Nurse Practitioner

## 2022-08-06 DIAGNOSIS — N63 Unspecified lump in unspecified breast: Secondary | ICD-10-CM

## 2022-08-06 DIAGNOSIS — N644 Mastodynia: Secondary | ICD-10-CM

## 2022-08-06 DIAGNOSIS — R2242 Localized swelling, mass and lump, left lower limb: Secondary | ICD-10-CM | POA: Diagnosis not present

## 2022-08-06 DIAGNOSIS — N6311 Unspecified lump in the right breast, upper outer quadrant: Secondary | ICD-10-CM | POA: Diagnosis not present

## 2022-08-07 ENCOUNTER — Encounter: Payer: Self-pay | Admitting: Nurse Practitioner

## 2022-08-08 ENCOUNTER — Encounter: Payer: Self-pay | Admitting: Nurse Practitioner

## 2022-08-08 ENCOUNTER — Ambulatory Visit (INDEPENDENT_AMBULATORY_CARE_PROVIDER_SITE_OTHER): Payer: 59 | Admitting: Nurse Practitioner

## 2022-08-08 VITALS — BP 118/72 | HR 63 | Temp 97.9°F | Ht 65.0 in | Wt 132.0 lb

## 2022-08-08 DIAGNOSIS — M816 Localized osteoporosis [Lequesne]: Secondary | ICD-10-CM | POA: Diagnosis not present

## 2022-08-08 DIAGNOSIS — M159 Polyosteoarthritis, unspecified: Secondary | ICD-10-CM | POA: Diagnosis not present

## 2022-08-08 DIAGNOSIS — E213 Hyperparathyroidism, unspecified: Secondary | ICD-10-CM

## 2022-08-08 DIAGNOSIS — Z1211 Encounter for screening for malignant neoplasm of colon: Secondary | ICD-10-CM

## 2022-08-08 DIAGNOSIS — Z1212 Encounter for screening for malignant neoplasm of rectum: Secondary | ICD-10-CM | POA: Diagnosis not present

## 2022-08-08 DIAGNOSIS — M720 Palmar fascial fibromatosis [Dupuytren]: Secondary | ICD-10-CM

## 2022-08-08 DIAGNOSIS — N6001 Solitary cyst of right breast: Secondary | ICD-10-CM | POA: Diagnosis not present

## 2022-08-08 DIAGNOSIS — I10 Essential (primary) hypertension: Secondary | ICD-10-CM

## 2022-08-08 LAB — CBC WITH DIFFERENTIAL/PLATELET
Absolute Monocytes: 365 cells/uL (ref 200–950)
Basophils Absolute: 40 cells/uL (ref 0–200)
Basophils Relative: 0.9 %
Eosinophils Absolute: 62 cells/uL (ref 15–500)
Eosinophils Relative: 1.4 %
HCT: 37.7 % (ref 35.0–45.0)
Hemoglobin: 12.7 g/dL (ref 11.7–15.5)
Lymphs Abs: 2108 cells/uL (ref 850–3900)
MCH: 31.8 pg (ref 27.0–33.0)
MCHC: 33.7 g/dL (ref 32.0–36.0)
MCV: 94.5 fL (ref 80.0–100.0)
MPV: 11.5 fL (ref 7.5–12.5)
Monocytes Relative: 8.3 %
Neutro Abs: 1826 cells/uL (ref 1500–7800)
Neutrophils Relative %: 41.5 %
Platelets: 222 10*3/uL (ref 140–400)
RBC: 3.99 10*6/uL (ref 3.80–5.10)
RDW: 13 % (ref 11.0–15.0)
Total Lymphocyte: 47.9 %
WBC: 4.4 10*3/uL (ref 3.8–10.8)

## 2022-08-08 NOTE — Progress Notes (Unsigned)
Careteam: Patient Care Team: Sharon Seller, NP as PCP - General (Geriatric Medicine) Ginnie Smart, MD as PCP - Infectious Diseases (Infectious Diseases) Avel Peace, MD as Consulting Physician (General Surgery)  PLACE OF SERVICE:  [facility]  Advanced Directive information Does Patient Have a Medical Advance Directive?: No, Would patient like information on creating a medical advance directive?: Yes (MAU/Ambulatory/Procedural Areas - Information given)  Allergies  Allergen Reactions   Crestor [Rosuvastatin Calcium]     myalgias   Pravastatin     myalgias   Sulfonamide Derivatives Swelling    SWELLING REACTION UNSPECIFIED     Chief Complaint  Patient presents with   Medical Management of Chronic Issues    6 month follow-up and sign treatment agreement. Discuss need for cologuard, covid booster, and flu vaccine (out of stock)      HPI: Patient is a 67 y.o. female for routine follow up.   She had recent follow up with endocrinologist for follow up on thyroid.   Had ultrasound of breast due to abnormal pulling/mass/cyst. Plan to recheck in 6 months.  Family hx of breast cancer.   In a lot of pain in the morning in her hips, shoulders. Also has pain in hands due to dupuytren's.  She does not like her pain regimen. Also feels like she needs tramadol and when she does not take medication can not focus.    Review of Systems:  Review of Systems  Constitutional:  Negative for chills, fever and weight loss.  HENT:  Negative for tinnitus.   Respiratory:  Negative for cough, sputum production and shortness of breath.   Cardiovascular:  Negative for chest pain, palpitations and leg swelling.  Gastrointestinal:  Negative for abdominal pain, constipation, diarrhea and heartburn.  Genitourinary:  Negative for dysuria, frequency and urgency.  Musculoskeletal:  Positive for joint pain and myalgias. Negative for back pain and falls.  Skin: Negative.   Neurological:   Negative for dizziness and headaches.  Psychiatric/Behavioral:  Negative for depression and memory loss. The patient does not have insomnia.   ***  Past Medical History:  Diagnosis Date   Anemia    Anxiety    hx of-not on any meds   Arthritis    "shoulders, arms, fingers, neck, back" (01/31/2016)   Chronic cervical pain    Family history of breast cancer    History of bronchitis    History of colon polyps    History of kidney stones    HIV infection (HCC) dx'd 2000   takes odefsey   Hyperlipidemia    takes Pravastatin daily   Hypertension    takes Benicar HCT daily   Hyperthyroidism    Hypothyroidism    Internal hemorrhoids    Joint pain    Migraine    "in the past" (01/31/2016)   Parathyroid adenoma    Pituitary adenoma (HCC)    Refusal of blood transfusions as patient is Jehovah's Witness    "but substitutes are fine". (01/31/2016)   Thyroid cancer (HCC) 12/2015   Upper back pain    Past Surgical History:  Procedure Laterality Date   ABDOMINAL HYSTERECTOMY     BREAST EXCISIONAL BIOPSY Left    no scar seen    BUNIONECTOMY Right    COLONOSCOPY     CYSTOSCOPY     HAMMER TOE SURGERY Left    "middle toe was broke; turned into hammertoe then repaired"   HAMMER TOE SURGERY Bilateral    "both small toes"  PARATHYROIDECTOMY Right 01/31/2016   PARATHYROIDECTOMY Right 01/31/2016   Procedure: PARATHYROIDECTOMY ON RIGHT SIDE;  Surgeon: Serena Colonel, MD;  Location: [facility] OR;  Service: ENT;  Laterality: Right;   SALPINGOOPHORECTOMY Bilateral    THYROIDECTOMY N/A 01/31/2016   Procedure: TOTAL THYROIDECTOMY;  Surgeon: Serena Colonel, MD;  Location: [facility] OR;  Service: ENT;  Laterality: N/A;   TOE SURGERY     bunion   TONSILLECTOMY     as a child   TOTAL THYROIDECTOMY  01/31/2016   TUBAL LIGATION     30 yrs ago   Social History:   reports that she has never smoked. She has never used smokeless tobacco. She reports that she does not currently use alcohol. She reports that she does not  use drugs.  Family History  Problem Relation Age of Onset   Breast cancer Mother 38   Hypertension Mother    Cancer Mother    Hyperlipidemia Mother    Stroke Mother    Other Father        'vocal dysautonomia'   Breast cancer Sister 68   Thyroid disease Sister    Osteoarthritis Maternal Grandmother    Colon cancer Neg Hx     Medications: Patient's Medications  New Prescriptions   No medications on file  Previous Medications   ACETAMINOPHEN (TYLENOL) 325 MG TABLET    Take 650 mg by mouth as needed.   CALCIUM CARBONATE (OS-CAL) 600 MG TABS TABLET    Take 1 tablet (600 mg total) by mouth 2 (two) times daily.   CHOLECALCIFEROL (VITAMIN D3) 50 MCG (2000 UT) CAPSULE    Take 1 capsule (2,000 Units total) by mouth daily.   DOLUTEGRAVIR-LAMIVUDINE (DOVATO) 50-300 MG TABLET    Take 1 tablet by mouth daily.   LEVOTHYROXINE (SYNTHROID) 50 MCG TABLET    Take 1 tablet (50 mcg total) by mouth daily.   MULTIPLE VITAMINS-MINERALS (MULTIVITAMIN ADULTS 50+ PO)    Take 1 tablet by mouth daily.   OLMESARTAN-HYDROCHLOROTHIAZIDE (BENICAR HCT) 20-12.5 MG TABLET    TAKE 1 TABLET BY MOUTH EVERY DAY   PRAVASTATIN (PRAVACHOL) 40 MG TABLET    TAKE 1 TABLET BY MOUTH EVERY DAY   TRAMADOL (ULTRAM) 50 MG TABLET    TAKE 1 TABLET BY MOUTH EVERY 8 HOURS AS NEEDED   UBROGEPANT (UBRELVY) 100 MG TABS    Take 100 mg by mouth as needed.  Modified Medications   No medications on file  Discontinued Medications   No medications on file    Physical Exam:  Vitals:   08/07/22 1620  BP: 118/72  Pulse: 63  Temp: 97.9 F (36.6 C)  TempSrc: Temporal  SpO2: 98%  Weight: 132 lb (59.9 kg)  Height: 5\' 5"  (1.651 m)   Body mass index is 21.97 kg/m. Wt Readings from Last 3 Encounters:  08/07/22 132 lb (59.9 kg)  05/28/22 130 lb (59 kg)  05/27/22 130 lb (59 kg)    Physical Exam Constitutional:      General: She is not in acute distress.    Appearance: She is well-developed. She is not diaphoretic.  HENT:      Head: Normocephalic and atraumatic.     Mouth/Throat:     Pharynx: No oropharyngeal exudate.  Eyes:     Conjunctiva/sclera: Conjunctivae normal.     Pupils: Pupils are equal, round, and reactive to light.  Cardiovascular:     Rate and Rhythm: Normal rate and regular rhythm.     Heart sounds: Normal heart sounds.  Pulmonary:     Effort: Pulmonary effort is normal.     Breath sounds: Normal breath sounds.  Abdominal:     General: Bowel sounds are normal.     Palpations: Abdomen is soft.  Musculoskeletal:     Cervical back: Normal range of motion and neck supple.     Right lower leg: No edema.     Left lower leg: No edema.  Skin:    General: Skin is warm and dry.  Neurological:     Mental Status: She is alert and oriented to person, place, and time.  Psychiatric:        Mood and Affect: Mood normal.     Labs reviewed: Basic Metabolic Panel: Recent Labs    11/07/21 1438 11/20/21 1537 05/30/22 1536 07/29/22 1414  NA 144 142  --   --   K 4.3 4.4  --   --   CL 103 101  --   --   CO2 28 27  --   --   GLUCOSE 110* 81  --   --   BUN 9 15  --   --   CREATININE 0.92 0.92  --   --   CALCIUM 9.7 9.7  --   --   TSH  --   --  0.11* 0.22*   Liver Function Tests: Recent Labs    11/07/21 1438  AST 18  ALT 12  ALKPHOS 70  BILITOT 0.3  PROT 6.4  ALBUMIN 4.0   No results for input(s): "LIPASE", "AMYLASE" in the last 8760 hours. No results for input(s): "AMMONIA" in the last 8760 hours. CBC: Recent Labs    11/07/21 1438 11/20/21 1537  WBC 3.8 4.2  HGB 11.1 11.7  HCT 32.5* 35.9  MCV 93 96  PLT 246 203   Lipid Panel: Recent Labs    11/07/21 1438  CHOL 175  HDL 52  LDLCALC 104*  TRIG 103  CHOLHDL 3.4   TSH: Recent Labs    05/30/22 1536 07/29/22 1414  TSH 0.11* 0.22*   A1C: Lab Results  Component Value Date   HGBA1C 6.0 (H) 05/30/2022     Assessment/Plan 1. Benign cyst of right breast -will follow up ultrasound in 6 months.   2. Encounter for  colorectal cancer screening - Cologuard  3. Essential hypertension, benign *** - CBC with Differential/Platelet - Complete Metabolic Panel with eGFR  4. Dupuytren's contracture of both hands Considering surgery due to dysfunction. Plan to follow up with specialist.  Only finger that will stay straight is her middle finger. Unable to put on lotion, pet her cat, wave - Ambulatory referral to Pain Clinic  5. Localized osteoporosis without current pathological fracture -continues on cal and vit d, discussed other options but declined at this time.   6. Osteoarthritis of multiple joints, unspecified osteoarthritis type *** - Ambulatory referral to Pain Clinic  7. Hyperparathyroidism (HCC) ***  No follow-ups on file.: ***   K. Biagio [facility] [phone]

## 2022-08-11 ENCOUNTER — Other Ambulatory Visit: Payer: Self-pay | Admitting: Nurse Practitioner

## 2022-08-11 DIAGNOSIS — M2550 Pain in unspecified joint: Secondary | ICD-10-CM

## 2022-08-11 NOTE — Telephone Encounter (Signed)
Patient is requesting a refill of the following medications: Requested Prescriptions   Pending Prescriptions Disp Refills   traMADol (ULTRAM) 50 MG tablet [Pharmacy Med Name: TRAMADOL HCL 50 MG TABLET] 90 tablet 0    Sig: TAKE 1 TABLET BY MOUTH EVERY 8 HOURS AS NEEDED    Date of last refill: 07/14/2022  Refill amount: 0  Treatment agreement date: 08/07/2022

## 2022-08-21 ENCOUNTER — Telehealth: Payer: 59 | Admitting: Physician Assistant

## 2022-08-21 DIAGNOSIS — U071 COVID-19: Secondary | ICD-10-CM

## 2022-08-22 ENCOUNTER — Encounter (HOSPITAL_COMMUNITY): Payer: Self-pay

## 2022-08-22 ENCOUNTER — Other Ambulatory Visit: Payer: Self-pay

## 2022-08-22 ENCOUNTER — Ambulatory Visit (HOSPITAL_COMMUNITY)
Admission: RE | Admit: 2022-08-22 | Discharge: 2022-08-22 | Disposition: A | Discharge status: Home / Self Care | Payer: 59 | Source: Ambulatory Visit | Attending: Emergency Medicine | Admitting: Emergency Medicine

## 2022-08-22 VITALS — BP 116/83 | HR 80 | Temp 100.6°F | Resp 20

## 2022-08-22 DIAGNOSIS — U071 COVID-19: Secondary | ICD-10-CM | POA: Diagnosis not present

## 2022-08-22 MED ORDER — IBUPROFEN 800 MG PO TABS
ORAL_TABLET | ORAL | Status: AC
  Filled 2022-08-22: qty 1

## 2022-08-22 MED ORDER — IBUPROFEN 800 MG PO TABS: 800.0000 mg | ORAL_TABLET | Freq: Three times a day (TID) | ORAL | 0 refills | Status: AC

## 2022-08-22 MED ORDER — PAXLOVID (150/100) 10 X 150 MG & 10 X 100MG PO TBPK: 2.0000 | ORAL_TABLET | Freq: Two times a day (BID) | ORAL | 0 refills | Status: AC

## 2022-08-22 MED ORDER — IBUPROFEN 800 MG PO TABS
800.0000 mg | ORAL_TABLET | Freq: Once | ORAL | Status: AC
  Administered 2022-08-22: 800 mg via ORAL

## 2022-08-22 MED ORDER — BENZONATATE 100 MG PO CAPS: 100.0000 mg | ORAL_CAPSULE | Freq: Three times a day (TID) | ORAL | 0 refills | Status: DC

## 2022-08-22 NOTE — ED Triage Notes (Signed)
Pt reports she had a positive home test for COVID. Chills,fever ,sore throat and HA.

## 2022-08-22 NOTE — Progress Notes (Signed)
   Thank you for the details you included in the comment boxes. Those details are very helpful in determining the best course of treatment for you and help [place] to provide the best care.Because you are COVID + and have risk factors for more serious illness and need to discuss antiviral therapies, we recommend that you convert this visit to a video visit in order for the provider to better assess what is going on.  The provider will be able to give you a more accurate diagnosis and treatment plan if we can more freely discuss your symptoms and with the addition of a virtual examination.   If you convert to a video visit, we will bill your insurance (similar to an office visit) and you will not be charged for this e-Visit. You will be able to stay at home and speak with the first available [facility] advanced practice provider. The link to do a video visit is in the drop down Menu tab of your Welcome screen in MyChart.

## 2022-08-22 NOTE — ED Provider Notes (Signed)
[facility]    CSN: [id number] Arrival date & time: 08/22/22  1531      History   Chief Complaint Chief Complaint  Patient presents with   Chills    Tested positive  for Covid 9:30 pm , August  15th.Symtoms include fever, sore throat , headaches  and body aches. - Entered by patient   Covid Positive    HPI Laurie Gray is a 67 y.o. female.   Patient presents to clinic for body aches, cough, chills, fatigue, fever and lethargy.  She did test positive for COVID-19 at home last night at 9:30 PM.  She has taken Tylenol about an hour or 2 prior to arrival, and she did take tramadol today for her body aches.  Reports previous COVID-19 infections, she has received some of the vaccine series as well.  Denies chest pain, shortness of breath or wheezing.      The history is provided by the patient and medical records.    Past Medical History:  Diagnosis Date   Anemia    Anxiety    hx of-not on any meds   Arthritis    "shoulders, arms, fingers, neck, back" (01/31/2016)   Chronic cervical pain    Family history of breast cancer    History of bronchitis    History of colon polyps    History of kidney stones    HIV infection (HCC) dx'd 2000   takes odefsey   Hyperlipidemia    takes Pravastatin daily   Hypertension    takes Benicar HCT daily   Hyperthyroidism    Hypothyroidism    Internal hemorrhoids    Joint pain    Migraine    "in the past" (01/31/2016)   Parathyroid adenoma    Pituitary adenoma (HCC)    Refusal of blood transfusions as patient is Jehovah's Witness    "but substitutes are fine". 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/24/2016   High risk medication use 09/24/2016   Chronic right shoulder pain 02/18/2016   Chronic left shoulder pain 02/18/2016   Neck pain 02/18/2016   Status post surgery 05/29/2015   Hammertoe 04/02/2015   S/P bunionectomy 11/08/2014   Arthritis 07/07/2014   Osteopenia 04/19/2014   Routine general medical examination at a health care facility 03/28/2014   Depression 11/02/2013   Hyperlipidemia 11/02/2013   Vaginal dysplasia 04/28/2013   Vaginal atrophy 04/06/2013   Right hip pain 08/23/2012   Pain, dental 04/07/2012   Vaginal bleeding 02/17/2012   Headache 12/01/2011   Cellulitis 07/03/2010   Contracture of finger joint 07/03/2010   Disorder of bone and cartilage 09/27/2009   HAND PAIN, RIGHT 09/24/2009   Essential hypertension, benign 07/27/2008   BREAST MASS 07/03/2008   Human immunodeficiency virus (HIV) disease (HCC) 04/03/2008   HSV 04/03/2008    Past Surgical History:  Procedure Laterality Date   ABDOMINAL HYSTERECTOMY  BREAST EXCISIONAL BIOPSY Left    no scar seen    BUNIONECTOMY Right    COLONOSCOPY     CYSTOSCOPY     HAMMER TOE SURGERY Left    "middle toe was broke; turned into hammertoe then repaired"   HAMMER TOE SURGERY Bilateral    "both small toes"   PARATHYROIDECTOMY Right 01/31/2016   PARATHYROIDECTOMY Right 01/31/2016   Procedure: PARATHYROIDECTOMY ON RIGHT SIDE;  Surgeon: Serena Colonel, MD;  Location: [facility] OR;  Service: ENT;  Laterality: Right;   SALPINGOOPHORECTOMY Bilateral    THYROIDECTOMY N/A 01/31/2016   Procedure: TOTAL THYROIDECTOMY;  Surgeon: Serena Colonel, MD;  Location: [facility] OR;  Service: ENT;  Laterality: N/A;   TOE SURGERY     bunion   TONSILLECTOMY     as a  child   TOTAL THYROIDECTOMY  01/31/2016   TUBAL LIGATION     30 yrs ago    OB History     Gravida  2   Para  2   Term      Preterm      AB      Living  2      SAB      IAB      Ectopic      Multiple      Live Births               Home Medications    Prior to Admission medications   Medication Sig Start Date End Date Taking? Authorizing Provider  acetaminophen (TYLENOL) 325 MG tablet Take 650 mg by mouth as needed.   Yes [provider]  benzonatate (TESSALON) 100 MG capsule Take 1 capsule (100 mg total) by mouth every 8 (eight) hours. 08/22/22  Yes Rinaldo Ratel, [place] N, FNP  calcium carbonate (OS-CAL) 600 MG TABS tablet Take 1 tablet (600 mg total) by mouth 2 (two) times daily. 08/02/21  Yes Sharon Seller, NP  Cholecalciferol (VITAMIN D3) 50 MCG (2000 UT) capsule Take 1 capsule (2,000 Units total) by mouth daily. 08/02/21  Yes Sharon Seller, NP  dolutegravir-lamiVUDine (DOVATO) 50-300 MG tablet Take 1 tablet by mouth daily. 09/25/21  Yes Ginnie Smart, MD  ibuprofen (ADVIL) 800 MG tablet Take 1 tablet (800 mg total) by mouth 3 (three) times daily. 08/22/22  Yes Rinaldo Ratel, [place] N, FNP  levothyroxine (SYNTHROID) 50 MCG tablet Take 1 tablet (50 mcg total) by mouth daily. 07/31/22  Yes Shamleffer, Konrad Dolores, MD  Multiple Vitamins-Minerals (MULTIVITAMIN ADULTS 50+ PO) Take 1 tablet by mouth daily.   Yes [provider]  nirmatrelvir & ritonavir (PAXLOVID, 150/100,) 10 x 150 MG & 10 x 100MG  TBPK Take 2 tablets by mouth 2 (two) times daily for 5 days. 08/22/22 08/27/22 Yes Rinaldo Ratel, [place] N, FNP  olmesartan-hydrochlorothiazide (BENICAR HCT) 20-12.5 MG tablet TAKE 1 TABLET BY MOUTH EVERY DAY 12/23/21  Yes Sharon Seller, NP  pravastatin (PRAVACHOL) 40 MG tablet TAKE 1 TABLET BY MOUTH EVERY DAY 07/14/22  Yes Sharon Seller, NP  traMADol (ULTRAM) 50 MG tablet TAKE 1 TABLET BY MOUTH EVERY 8 HOURS AS NEEDED 08/11/22  Yes Eubanks, Janene Harvey, NP  Ubrogepant (UBRELVY) 100 MG TABS Take 100 mg by mouth as needed. 10/03/21  Yes Ocie Doyne, MD    Family History Family History  Problem Relation Age of Onset   Breast cancer Mother 42   Hypertension Mother    Cancer Mother    Hyperlipidemia Mother    Stroke Mother  Other Father        'vocal dysautonomia'   Breast cancer Sister 17   Thyroid disease Sister    Osteoarthritis Maternal Grandmother    Colon cancer Neg Hx     Social History Social History   Tobacco Use   Smoking status: Never   Smokeless tobacco: Never  Vaping Use   Vaping status: Never Used  Substance Use Topics   Alcohol use: Not Currently    Alcohol/week: 0.0 standard drinks of alcohol    Comment: 01/31/2016 "might go out to eat and have a drink once/month; no more than that"   Drug use: No     Allergies   Crestor [rosuvastatin calcium], Pravastatin, and Sulfonamide derivatives   Review of Systems Review of Systems  Constitutional:  Positive for chills, fatigue and fever.  HENT:  Positive for congestion and sore throat.   Respiratory:  Positive for cough. Negative for shortness of breath and wheezing.      Physical Exam Triage Vital Signs ED Triage Vitals  Encounter Vitals Group     BP 08/22/22 1606 116/83     Systolic BP Percentile --      Diastolic BP Percentile --      Pulse Rate 08/22/22 1606 80     Resp 08/22/22 1606 20     Temp 08/22/22 1606 (!) 100.6 F (38.1 C)     Temp src --      SpO2 08/22/22 1606 93 %     Weight --      Height --      Head Circumference --      Peak Flow --      Pain Score 08/22/22 1558 6     Pain Loc --      Pain Education --      Exclude from Growth Chart --    No data found.  Updated Vital Signs BP 116/83   Pulse 80   Temp (!) 100.6 F (38.1 C)   Resp 20   SpO2 93%   Visual Acuity Right Eye Distance:   Left Eye Distance:   Bilateral Distance:    Right Eye Near:   Left Eye Near:    Bilateral Near:     Physical Exam Vitals  and nursing note reviewed.  Constitutional:      Appearance: Normal appearance.  HENT:     Head: Normocephalic and atraumatic.     Right Ear: External ear normal.     Left Ear: External ear normal.     Nose: Nose normal.     Mouth/Throat:     Mouth: Mucous membranes are moist.     Pharynx: Posterior oropharyngeal erythema present.  Eyes:     Conjunctiva/sclera: Conjunctivae normal.  Cardiovascular:     Rate and Rhythm: Normal rate and regular rhythm.     Heart sounds: Normal heart sounds. No murmur heard. Pulmonary:     Effort: Pulmonary effort is normal.     Breath sounds: Normal breath sounds.  Musculoskeletal:        General: Normal range of motion.     Cervical back: Normal range of motion.  Lymphadenopathy:     Cervical: Cervical adenopathy present.  Skin:    General: Skin is warm and dry.  Neurological:     General: No focal deficit present.     Mental Status: She is alert and oriented to person, place, and time.  Psychiatric:        Mood and Affect: Mood normal.  Behavior: Behavior normal.      UC Treatments / Results  Labs (all labs ordered are listed, but only abnormal results are displayed) Labs Reviewed - No data to display  EKG   Radiology No results found.  Procedures Procedures (including critical care time)  Medications Ordered in UC Medications  ibuprofen (ADVIL) tablet 800 mg (800 mg Oral Given 08/22/22 1640)    Initial Impression / Assessment and Plan / UC Course  I have reviewed the triage vital signs and the nursing notes.  Pertinent labs & imaging results that were available during my care of the patient were reviewed by me and considered in my medical decision making (see chart for details).  Vitals and triage reviewed, patient is hemodynamically stable.  She did test positive for COVID-19 yesterday.  Lungs vesicular posteriorly, heart w/ RRR. Past medical history is significant for HIV, HLD, thyroid cancer and migraines.  Due to  comorbidities and advanced age, she did advise not to take Paxlovid with Ubrelvy or tramadol due to potential interactions.  Recent GFR on file is within normal limits, drawn earlier this month.  Symptomatic management discussed.  Plan of care, follow-up care and return precautions given, no questions at this time.     Final Clinical Impressions(s) / UC Diagnoses   Final diagnoses:  COVID-19 virus infection     Discharge Instructions      You have COVID-19 infection and her are at high risk of complications due to your previous medical history.  I have called in Paxlovid for you to start, please do not use your Ubrelvy or Tramadol while on this medication as there are interactions. For your fever and body aches you can alternate Tylenol and ibuprofen every 4-6 hours. For congestion and sore throat you can take mucinex, do warm saline gargles, tea with honey and sleep with a humidifier.  Ensure you are staying hydrated with at least 64 ounces of water daily.   Return to clinic or seek immediate care if you do not develop any improvement despite finishing the Paxlovid, develop chest pain, shortness of breath, high fever despite medication, or any new concerning symptoms.      ED Prescriptions     Medication Sig Dispense Auth. Provider   ibuprofen (ADVIL) 800 MG tablet Take 1 tablet (800 mg total) by mouth 3 (three) times daily. 21 tablet Rinaldo Ratel, [place] N, [place]   benzonatate (TESSALON) 100 MG capsule Take 1 capsule (100 mg total) by mouth every 8 (eight) hours. 21 capsule Rinaldo Ratel, [place] N, [place]   nirmatrelvir & ritonavir (PAXLOVID, 150/100,) 10 x 150 MG & 10 x 100MG  TBPK Take 2 tablets by mouth 2 (two) times daily for 5 days. 20 tablet Kayston Jodoin, [place] N, [place]      PDMP not reviewed this encounter.   Hansini Clodfelter, [place] N, [place] 08/22/22 [phone]

## 2022-08-22 NOTE — ED Triage Notes (Signed)
Pt took tylenol unsure of dose and tramadol today.

## 2022-08-22 NOTE — Discharge Instructions (Addendum)
You have COVID-19 infection and her are at high risk of complications due to your previous medical history.  I have called in Paxlovid for you to start, please do not use your Ubrelvy or Tramadol while on this medication as there are interactions. For your fever and body aches you can alternate Tylenol and ibuprofen every 4-6 hours. For congestion and sore throat you can take mucinex, do warm saline gargles, tea with honey and sleep with a humidifier.  Ensure you are staying hydrated with at least 64 ounces of water daily.   Return to clinic or seek immediate care if you do not develop any improvement despite finishing the Paxlovid, develop chest pain, shortness of breath, high fever despite medication, or any new concerning symptoms.

## 2022-08-23 ENCOUNTER — Encounter: Payer: Self-pay | Admitting: Nurse Practitioner

## 2022-08-28 DIAGNOSIS — Z1212 Encounter for screening for malignant neoplasm of rectum: Secondary | ICD-10-CM | POA: Diagnosis not present

## 2022-08-28 DIAGNOSIS — Z1211 Encounter for screening for malignant neoplasm of colon: Secondary | ICD-10-CM | POA: Diagnosis not present

## 2022-09-05 ENCOUNTER — Telehealth: Payer: 59

## 2022-09-05 NOTE — Telephone Encounter (Signed)
-----   Message from Sharon Seller sent at 09/05/2022  8:38 AM EDT ----- Cologuard negative

## 2022-09-08 ENCOUNTER — Other Ambulatory Visit: Payer: Self-pay | Admitting: Nurse Practitioner

## 2022-09-08 DIAGNOSIS — M2550 Pain in unspecified joint: Secondary | ICD-10-CM

## 2022-09-09 NOTE — Telephone Encounter (Signed)
Patient is requesting a refill of the following medications: Requested Prescriptions   Pending Prescriptions Disp Refills   traMADol (ULTRAM) 50 MG tablet [Pharmacy Med Name: TRAMADOL HCL 50 MG TABLET] 90 tablet 0    Sig: TAKE 1 TABLET BY MOUTH EVERY 8 HOURS AS NEEDED    Date of last refill: 08/11/22  Refill amount: 0  Treatment agreement date: 08/07/22

## 2022-09-30 ENCOUNTER — Other Ambulatory Visit: Payer: 59

## 2022-10-01 ENCOUNTER — Other Ambulatory Visit: Payer: 59

## 2022-10-02 ENCOUNTER — Other Ambulatory Visit (INDEPENDENT_AMBULATORY_CARE_PROVIDER_SITE_OTHER): Payer: 59

## 2022-10-02 DIAGNOSIS — E890 Postprocedural hypothyroidism: Secondary | ICD-10-CM | POA: Diagnosis not present

## 2022-10-03 LAB — TSH: TSH: 7.45 u[IU]/mL — ABNORMAL HIGH (ref 0.35–5.50)

## 2022-10-08 ENCOUNTER — Other Ambulatory Visit: Payer: Self-pay | Admitting: Nurse Practitioner

## 2022-10-08 ENCOUNTER — Other Ambulatory Visit: Payer: Self-pay | Admitting: Infectious Diseases

## 2022-10-08 DIAGNOSIS — B20 Human immunodeficiency virus [HIV] disease: Secondary | ICD-10-CM

## 2022-10-08 DIAGNOSIS — M2550 Pain in unspecified joint: Secondary | ICD-10-CM

## 2022-10-08 NOTE — Telephone Encounter (Signed)
Patient has request refill on medication Tramadol 50mg . Patient medication last refilled 09/09/2022. Patient has Non Opioid Contract dated 04/09/2021. Patient has upcoming appointment 02/13/2023. Update Contract added to patient appointment note. Medication pend and sent to PCP Janyth Contes Janene Harvey, NP

## 2022-10-29 ENCOUNTER — Other Ambulatory Visit: Payer: Self-pay | Admitting: Internal Medicine

## 2022-10-30 DIAGNOSIS — M25511 Pain in right shoulder: Secondary | ICD-10-CM | POA: Diagnosis not present

## 2022-11-06 ENCOUNTER — Other Ambulatory Visit: Payer: Self-pay | Admitting: Nurse Practitioner

## 2022-11-06 DIAGNOSIS — M2550 Pain in unspecified joint: Secondary | ICD-10-CM

## 2022-11-06 NOTE — Telephone Encounter (Signed)
Patient has request refill on medication Tramadol. Patient medication last refilled 10/08/2022. Patient has Non Opioid Contract on file dated 04/09/2021. Patient medication pend and sent to PCP Janyth Contes Janene Harvey, NP for approval.

## 2022-11-07 MED ORDER — TRAMADOL HCL 50 MG PO TABS
50.0000 mg | ORAL_TABLET | Freq: Three times a day (TID) | ORAL | 0 refills | Status: DC | PRN
Start: 2022-11-07 — End: 2022-12-05

## 2022-11-17 ENCOUNTER — Other Ambulatory Visit: Payer: Self-pay | Admitting: Nurse Practitioner

## 2022-11-17 NOTE — Telephone Encounter (Signed)
High risk or very high risk warning populated when attempting to refill medication (pravastatin). RX request sent to PCP for review and approval if warranted.

## 2022-12-02 ENCOUNTER — Ambulatory Visit: Payer: 59 | Admitting: Internal Medicine

## 2022-12-05 ENCOUNTER — Other Ambulatory Visit: Payer: Self-pay | Admitting: Nurse Practitioner

## 2022-12-05 ENCOUNTER — Encounter: Payer: Self-pay | Admitting: Internal Medicine

## 2022-12-05 DIAGNOSIS — E890 Postprocedural hypothyroidism: Secondary | ICD-10-CM

## 2022-12-05 DIAGNOSIS — M2550 Pain in unspecified joint: Secondary | ICD-10-CM

## 2022-12-08 MED ORDER — PRAVASTATIN SODIUM 40 MG PO TABS: 40.0000 mg | ORAL_TABLET | Freq: Every day | ORAL | 0 refills | Status: DC

## 2022-12-08 MED ORDER — TRAMADOL HCL 50 MG PO TABS: 50.0000 mg | ORAL_TABLET | Freq: Three times a day (TID) | ORAL | 0 refills | Status: DC | PRN

## 2022-12-08 NOTE — Telephone Encounter (Signed)
Patient has request refill on medication Tramadol. Patient medication last refilled November 2024. Medication pend and sent to PCP Janyth Contes Janene Harvey, NP for approval.

## 2022-12-08 NOTE — Telephone Encounter (Signed)
Patient medication has High Risk Warnings. Medication pend and sent to PCP Janyth Contes Janene Harvey, NP

## 2022-12-09 ENCOUNTER — Other Ambulatory Visit: Payer: 59

## 2022-12-09 DIAGNOSIS — E890 Postprocedural hypothyroidism: Secondary | ICD-10-CM | POA: Diagnosis not present

## 2022-12-10 ENCOUNTER — Other Ambulatory Visit: Payer: Self-pay | Admitting: Internal Medicine

## 2022-12-10 DIAGNOSIS — Z85850 Personal history of malignant neoplasm of thyroid: Secondary | ICD-10-CM

## 2022-12-10 LAB — TSH: TSH: 4.91 m[IU]/L — ABNORMAL HIGH (ref 0.40–4.50)

## 2022-12-10 LAB — T4, FREE: Free T4: 0.9 ng/dL (ref 0.8–1.8)

## 2022-12-10 MED ORDER — LEVOTHYROXINE SODIUM 50 MCG PO TABS: 50.0000 ug | ORAL_TABLET | ORAL | 3 refills | Status: DC

## 2023-01-03 ENCOUNTER — Other Ambulatory Visit: Payer: Self-pay | Admitting: Nurse Practitioner

## 2023-01-03 DIAGNOSIS — M2550 Pain in unspecified joint: Secondary | ICD-10-CM

## 2023-01-04 ENCOUNTER — Other Ambulatory Visit: Payer: Self-pay | Admitting: Nurse Practitioner

## 2023-01-04 DIAGNOSIS — M2550 Pain in unspecified joint: Secondary | ICD-10-CM

## 2023-01-05 NOTE — Telephone Encounter (Signed)
Patient is requesting a refill of the following medications: Requested Prescriptions   Pending Prescriptions Disp Refills   traMADol (ULTRAM) 50 MG tablet [Pharmacy Med Name: TRAMADOL HCL 50 MG TABLET] 90 tablet 0    Sig: TAKE 1 TABLET BY MOUTH EVERY 8 HOURS AS NEEDED.    Date of last refill: 12/08/22  Refill amount: 90  Treatment agreement date: August 20245

## 2023-01-05 NOTE — Telephone Encounter (Signed)
Patient has request refill on medication Tramadol. Patient medication last refilled today. This may be duplicate. Patient has contract dated 04/09/2021. Update contract added to patient future appointment note 02/13/2023.

## 2023-01-13 ENCOUNTER — Encounter: Payer: Self-pay | Admitting: Infectious Diseases

## 2023-01-13 ENCOUNTER — Ambulatory Visit: Payer: 59 | Admitting: Infectious Diseases

## 2023-01-13 ENCOUNTER — Other Ambulatory Visit (HOSPITAL_COMMUNITY)
Admission: RE | Admit: 2023-01-13 | Discharge: 2023-01-13 | Disposition: A | Discharge status: Home / Self Care | Payer: 59 | Source: Ambulatory Visit | Attending: Infectious Diseases | Admitting: Infectious Diseases

## 2023-01-13 VITALS — BP 110/88 | HR 88 | Temp 98.3°F | Ht 65.0 in | Wt 135.4 lb

## 2023-01-13 DIAGNOSIS — Z113 Encounter for screening for infections with a predominantly sexual mode of transmission: Secondary | ICD-10-CM | POA: Diagnosis present

## 2023-01-13 DIAGNOSIS — E039 Hypothyroidism, unspecified: Secondary | ICD-10-CM

## 2023-01-13 DIAGNOSIS — I10 Essential (primary) hypertension: Secondary | ICD-10-CM

## 2023-01-13 DIAGNOSIS — Z79899 Other long term (current) drug therapy: Secondary | ICD-10-CM

## 2023-01-13 DIAGNOSIS — E213 Hyperparathyroidism, unspecified: Secondary | ICD-10-CM

## 2023-01-13 DIAGNOSIS — M720 Palmar fascial fibromatosis [Dupuytren]: Secondary | ICD-10-CM | POA: Diagnosis not present

## 2023-01-13 DIAGNOSIS — E890 Postprocedural hypothyroidism: Secondary | ICD-10-CM

## 2023-01-13 DIAGNOSIS — F331 Major depressive disorder, recurrent, moderate: Secondary | ICD-10-CM

## 2023-01-13 DIAGNOSIS — B20 Human immunodeficiency virus [HIV] disease: Secondary | ICD-10-CM

## 2023-01-13 NOTE — Assessment & Plan Note (Addendum)
 She is doing well Will check her labs today She denies getting vax last fall.  We discussed cabaneuva which she does not want due to needle phobia.  There are several qweek medications coming. We agree to wait on those.  Will see her back in 6 months I suggested having her seen by counselor but she deferred.

## 2023-01-13 NOTE — Assessment & Plan Note (Signed)
 We discussed surgery and she remains hesitant.  She will continue to consider this.

## 2023-01-13 NOTE — Progress Notes (Signed)
 Subjective:    Patient ID: Laurie Gray, female  DOB: Feb 20, 1955, 68 y.o.        MRN: [id number]   HPI 68 yo F with hx of dx 2000 HIV+, depression, hyperlipidemia, prev lap hysterecomy-BSO (2009). Previously on D4T monotherapy,  atripla --> odefsy-->dovato .  Had previous abn on colpo (SIL). Had hysterectomy many years ago (~ 25) then ovaries 15 yrs ago.  She had mammo 02-2017 which showed R cysts. Since normal mammos.     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  She also got radiocative Iodine Has had Endo f/u and her levothyroxine  was decreased at last visit to 50. Now trying 50ug for 5 days and 100ug for 2 days.  Has f/u appt in 2 weeks.     Also hx of dupteryn's contractures- she has been seen by hand surgery. Had L 4th digit release (ziaflex) which did not help.  She still has pain (and contractures) in all her digits. She feels like it is getting worse.  Does not currently want (multiple) surgery. She does not want the long recovery or that the results are not guaranteed.    Was in ED with corneal abrasion in September.    Prev dx with osteoporosis via BMD. Due to this at her f/u in Sept 2023 she was changed to Dovato  (to eliminate tdf).  Is feeling down today- her daughter and her sister both moved away.  She wonders if her depression is related to dovato .  Wonders if there is anything better.  She has had hair loss, noticed wt gain. Has started taking supplements to try and help this.  She has quit these as she was afraid they were interfering with her T3/4 levels.   She got a notice that her housing unit has lead water . She has been using bottled water  for years but has been cooking with it. Worried she may have to move.   HIV 1 RNA Quant  Date Value  04/11/2021 NOT DETECTED copies/mL  04/24/2020 Not Detected Copies/mL  07/25/2019 <20 NOT DETECTED copies/mL   HIV-1 RNA Viral Load (copies/mL)  Date Value  11/07/2021 <20    CD4 T Cell Abs (/uL)  Date Value  11/07/2021 693  04/11/2021 681  04/24/2020 644     Health Maintenance  Topic Date Due  . INFLUENZA VACCINE  08/07/2022  . COVID-19 Vaccine (4 - 2024-25 season) 09/07/2022  . Medicare Annual Wellness (AWV)  02/26/2023  . DEXA SCAN  07/24/2023  . MAMMOGRAM  08/05/2024  . Fecal DNA (Cologuard)  08/27/2025  . DTaP/Tdap/Td (2 - Td or Tdap) 06/23/2030  . Pneumonia Vaccine 61+ Years old  Completed  . Hepatitis C Screening  Completed  . Zoster Vaccines- Shingrix   Completed  . HPV VACCINES  Aged Out  . Colonoscopy  Discontinued    Review of Systems  Constitutional:  Positive for malaise/fatigue. Negative for weight loss.  HENT:  Negative for sore throat (raspy throat sometimes, hx of dyphonia. occas hoarse when she awakens.).   Respiratory:  Negative for cough and shortness of breath.   Gastrointestinal:  Negative for constipation and diarrhea.  Genitourinary:  Negative for dysuria.  Psychiatric/Behavioral:  Positive for depression.    Please see HPI. All other systems reviewed and negative.     Objective:  Physical Exam Vitals reviewed.  Constitutional:      Appearance: Normal appearance.  HENT:     Mouth/Throat:     Mouth: Mucous membranes are  moist.     Pharynx: No oropharyngeal exudate.  Eyes:     Extraocular Movements: Extraocular movements intact.     Pupils: Pupils are equal, round, and reactive to light.  Cardiovascular:     Rate and Rhythm: Normal rate and regular rhythm.  Pulmonary:     Effort: Pulmonary effort is normal.     Breath sounds: Normal breath sounds.  Abdominal:     General: Bowel sounds are normal. There is no distension.     Palpations: Abdomen is soft.     Tenderness: There is no abdominal tenderness.  Musculoskeletal:        General: Normal range of motion.     Cervical back: Normal range of motion and neck supple. No rigidity.     Right lower leg: No edema.     Left lower leg: No edema.  Neurological:      General: No focal deficit present.     Mental Status: She is alert.  Psychiatric:        Mood and Affect: Mood normal.          Assessment & Plan:

## 2023-01-13 NOTE — Assessment & Plan Note (Signed)
 Well controlled

## 2023-01-13 NOTE — Assessment & Plan Note (Signed)
 Reactive due to her family members moving away.  Offered counseling which she deferred.  May also be due to thyroid

## 2023-01-13 NOTE — Assessment & Plan Note (Signed)
 She has f/u in the next 2 weeks.  Will defer checking her labs today

## 2023-01-13 NOTE — Assessment & Plan Note (Signed)
 She has f/u in the next 2 weeks.  Will defer checking her labs today Suspect many of her sx are from this.

## 2023-01-14 LAB — URINE CYTOLOGY ANCILLARY ONLY
Chlamydia: NEGATIVE
Comment: NEGATIVE
Comment: NORMAL
Neisseria Gonorrhea: NEGATIVE

## 2023-01-14 LAB — T-HELPER CELLS (CD4) COUNT (NOT AT ARMC)
CD4 % Helper T Cell: 40 % (ref 33–65)
CD4 T Cell Abs: 758 /uL (ref 400–1790)

## 2023-01-15 LAB — COMPREHENSIVE METABOLIC PANEL
ALT: 16 [IU]/L (ref 0–32)
AST: 20 [IU]/L (ref 0–40)
Albumin: 4.5 g/dL (ref 3.9–4.9)
Alkaline Phosphatase: 78 [IU]/L (ref 44–121)
BUN/Creatinine Ratio: 17 (ref 12–28)
BUN: 18 mg/dL (ref 8–27)
Bilirubin Total: 0.3 mg/dL (ref 0.0–1.2)
CO2: 26 mmol/L (ref 20–29)
Calcium: 10.4 mg/dL — ABNORMAL HIGH (ref 8.7–10.3)
Chloride: 104 mmol/L (ref 96–106)
Creatinine, Ser: 1.06 mg/dL — ABNORMAL HIGH (ref 0.57–1.00)
Globulin, Total: 2.6 g/dL (ref 1.5–4.5)
Glucose: 72 mg/dL (ref 70–99)
Potassium: 4.6 mmol/L (ref 3.5–5.2)
Sodium: 145 mmol/L — ABNORMAL HIGH (ref 134–144)
Total Protein: 7.1 g/dL (ref 6.0–8.5)
eGFR: 58 mL/min/{1.73_m2} — ABNORMAL LOW (ref >59)

## 2023-01-15 LAB — CBC
Hematocrit: 40.1 % (ref 34.0–46.6)
Hemoglobin: 13.2 g/dL (ref 11.1–15.9)
MCH: 32 pg (ref 26.6–33.0)
MCHC: 32.9 g/dL (ref 31.5–35.7)
MCV: 97 fL (ref 79–97)
Platelets: 249 10*3/uL (ref 150–450)
RBC: 4.12 x10E6/uL (ref 3.77–5.28)
RDW: 12.9 % (ref 11.7–15.4)
WBC: 4.7 10*3/uL (ref 3.4–10.8)

## 2023-01-15 LAB — LIPID PANEL
Chol/HDL Ratio: 3.1 {ratio} (ref 0.0–4.4)
Cholesterol, Total: 238 mg/dL — ABNORMAL HIGH (ref 100–199)
HDL: 76 mg/dL (ref >39)
LDL Chol Calc (NIH): 147 mg/dL — ABNORMAL HIGH (ref 0–99)
Triglycerides: 86 mg/dL (ref 0–149)
VLDL Cholesterol Cal: 15 mg/dL (ref 5–40)

## 2023-01-15 LAB — RPR: RPR Ser Ql: NONREACTIVE

## 2023-01-15 LAB — HIV-1 RNA QUANT-NO REFLEX-BLD
HIV-1 RNA Viral Load Log: 2.792 {Log_copies}/mL
HIV-1 RNA Viral Load: 620 {copies}/mL

## 2023-01-23 ENCOUNTER — Other Ambulatory Visit: Payer: Self-pay

## 2023-01-23 MED ORDER — LEVOTHYROXINE SODIUM 50 MCG PO TABS: 50.0000 ug | ORAL_TABLET | ORAL | 3 refills | Status: DC

## 2023-02-05 ENCOUNTER — Other Ambulatory Visit: Payer: Self-pay

## 2023-02-06 ENCOUNTER — Other Ambulatory Visit: Payer: Self-pay | Admitting: Nurse Practitioner

## 2023-02-06 DIAGNOSIS — M2550 Pain in unspecified joint: Secondary | ICD-10-CM

## 2023-02-06 MED ORDER — TRAMADOL HCL 50 MG PO TABS: 50.0000 mg | ORAL_TABLET | Freq: Three times a day (TID) | ORAL | 0 refills | Status: DC | PRN

## 2023-02-06 NOTE — Telephone Encounter (Signed)
Patient has request refill on medication Tramadol. Patient medication last refill dated 01/05/2023. Patient has Non Opioid Contract on file dated 04/09/2021. Patient has upcoming appointment 02/21/2023. Update Contract added to appointment notes. Medication pend and sent to PCP Janyth Contes Janene Harvey, NP

## 2023-02-06 NOTE — Telephone Encounter (Signed)
Patient is requesting a refill of the following medications: Requested Prescriptions   Pending Prescriptions Disp Refills   traMADol (ULTRAM) 50 MG tablet [Pharmacy Med Name: TRAMADOL HCL 50 MG TABLET] 90 tablet 0    Sig: TAKE 1 TABLET BY MOUTH EVERY 8 HOURS AS NEEDED    Date of last refill: 01/05/2023  Refill amount: 90 tabs/ 0 refill  Treatment agreement date: 08/07/22

## 2023-02-09 ENCOUNTER — Other Ambulatory Visit: Payer: Self-pay | Admitting: Nurse Practitioner

## 2023-02-09 ENCOUNTER — Ambulatory Visit
Admission: RE | Admit: 2023-02-09 | Discharge: 2023-02-09 | Disposition: A | Discharge status: Home / Self Care | Payer: 59 | Source: Ambulatory Visit | Attending: Nurse Practitioner | Admitting: Nurse Practitioner

## 2023-02-09 ENCOUNTER — Other Ambulatory Visit: Payer: 59

## 2023-02-09 DIAGNOSIS — N644 Mastodynia: Secondary | ICD-10-CM

## 2023-02-09 DIAGNOSIS — N6310 Unspecified lump in the right breast, unspecified quadrant: Secondary | ICD-10-CM

## 2023-02-09 DIAGNOSIS — N63 Unspecified lump in unspecified breast: Secondary | ICD-10-CM

## 2023-02-09 DIAGNOSIS — N6011 Diffuse cystic mastopathy of right breast: Secondary | ICD-10-CM | POA: Diagnosis not present

## 2023-02-09 DIAGNOSIS — N6002 Solitary cyst of left breast: Secondary | ICD-10-CM | POA: Diagnosis not present

## 2023-02-09 DIAGNOSIS — Z85850 Personal history of malignant neoplasm of thyroid: Secondary | ICD-10-CM | POA: Diagnosis not present

## 2023-02-10 ENCOUNTER — Encounter: Payer: Self-pay | Admitting: Internal Medicine

## 2023-02-10 LAB — T4, FREE: Free T4: 1.4 ng/dL (ref 0.8–1.8)

## 2023-02-10 LAB — TSH: TSH: 0.9 m[IU]/L (ref 0.40–4.50)

## 2023-02-11 DIAGNOSIS — H35371 Puckering of macula, right eye: Secondary | ICD-10-CM | POA: Diagnosis not present

## 2023-02-13 ENCOUNTER — Ambulatory Visit: Payer: 59 | Admitting: Nurse Practitioner

## 2023-02-18 ENCOUNTER — Ambulatory Visit (INDEPENDENT_AMBULATORY_CARE_PROVIDER_SITE_OTHER): Payer: 59 | Admitting: Nurse Practitioner

## 2023-02-18 VITALS — BP 120/76 | HR 73 | Temp 97.8°F | Resp 18 | Ht 65.0 in | Wt 133.6 lb

## 2023-02-18 DIAGNOSIS — E213 Hyperparathyroidism, unspecified: Secondary | ICD-10-CM | POA: Diagnosis not present

## 2023-02-18 DIAGNOSIS — M720 Palmar fascial fibromatosis [Dupuytren]: Secondary | ICD-10-CM

## 2023-02-18 DIAGNOSIS — E890 Postprocedural hypothyroidism: Secondary | ICD-10-CM

## 2023-02-18 DIAGNOSIS — Z23 Encounter for immunization: Secondary | ICD-10-CM | POA: Diagnosis not present

## 2023-02-18 DIAGNOSIS — R739 Hyperglycemia, unspecified: Secondary | ICD-10-CM | POA: Diagnosis not present

## 2023-02-18 DIAGNOSIS — M159 Polyosteoarthritis, unspecified: Secondary | ICD-10-CM | POA: Diagnosis not present

## 2023-02-18 DIAGNOSIS — E785 Hyperlipidemia, unspecified: Secondary | ICD-10-CM | POA: Diagnosis not present

## 2023-02-18 DIAGNOSIS — I10 Essential (primary) hypertension: Secondary | ICD-10-CM | POA: Diagnosis not present

## 2023-02-18 DIAGNOSIS — Z79899 Other long term (current) drug therapy: Secondary | ICD-10-CM | POA: Diagnosis not present

## 2023-02-18 DIAGNOSIS — B20 Human immunodeficiency virus [HIV] disease: Secondary | ICD-10-CM

## 2023-02-18 MED ORDER — OLMESARTAN MEDOXOMIL-HCTZ 20-12.5 MG PO TABS: 1.0000 | ORAL_TABLET | Freq: Every day | ORAL | 1 refills | Status: DC

## 2023-02-18 MED ORDER — PRAVASTATIN SODIUM 40 MG PO TABS: 40.0000 mg | ORAL_TABLET | Freq: Every day | ORAL | 1 refills | Status: DC

## 2023-02-18 NOTE — Progress Notes (Signed)
 Careteam: Patient Care Team: Sharon Seller, NP as PCP - General (Geriatric Medicine) Ginnie Smart, MD as PCP - Infectious Diseases (Infectious Diseases) Avel Peace, MD as Consulting Physician (General Surgery)   PLACE OF SERVICE: [facility]  Advanced Directive information Does Patient Have a Medical Advance Directive?: Yes, Type of Advance Directive: Healthcare Power of [place];Living will, Does patient want to make changes to medical advance directive?: No - Patient declined   Allergies  Allergen Reactions   Crestor [Rosuvastatin Calcium]     myalgias   Pravastatin     myalgias   Sulfonamide Derivatives Swelling    SWELLING REACTION UNSPECIFIED      Chief Complaint  Patient presents with   Medical Management of Chronic Issues    6 month follow up and discuss flu and covid vaccines and mammogram and Medicare annual wellness visit.    HPI: Patient is a 68 y.o. female presents for a 51-month follow-up.   Does not have a good diet overall, eats a lot of sweets. Rides her stationary bike and walks on her treadmill some.   Cr/eGFR abnormal at last check with ID, thinks she was dehydrated, was sick around that time. Will see ID again around June.  PMH of thyroid cancer with thyroidectomy, parathyroid removed. Levothyroxine managed by endocrinology. Concerned about her calcium as well as it was 10.4.  OA/Dupteryn's contractures- needs to take tramadol everyday, reports if she doesn't take it she "feels terrible" and is in so much pain.   Depression: still feeling sad but doing okay. Tearful during visit    Review of Systems:  Review of Systems  Constitutional:  Positive for malaise/fatigue. Negative for chills, fever and weight loss.  Respiratory:  Negative for cough and shortness of breath.   Cardiovascular:  Negative for chest pain and leg swelling.  Gastrointestinal:  Negative for constipation, diarrhea, nausea and vomiting.  Genitourinary:  Negative for  dysuria, frequency and urgency.  Musculoskeletal:  Positive for joint pain.  Neurological:  Negative for dizziness, weakness and headaches.  Psychiatric/Behavioral:  Positive for depression. The patient does not have insomnia.     Past Medical History:  Diagnosis Date   Anemia    Anxiety    hx of-not on any meds   Arthritis    "shoulders, arms, fingers, neck, back" (01/31/2016)   Chronic cervical pain    Family history of breast cancer    History of bronchitis    History of colon polyps    History of kidney stones    HIV infection (HCC) dx'd 2000   takes odefsey   Hyperlipidemia    takes Pravastatin daily   Hypertension    takes Benicar HCT daily   Hyperthyroidism    Hypothyroidism    Internal hemorrhoids    Joint pain    Migraine    "in the past" (01/31/2016)   Parathyroid adenoma    Pituitary adenoma (HCC)    Refusal of blood transfusions as patient is Jehovah's Witness    "but substitutes are fine". (01/31/2016)   Thyroid cancer (HCC) 12/2015   Upper back pain     Past Surgical History:  Procedure Laterality Date   ABDOMINAL HYSTERECTOMY     BREAST EXCISIONAL BIOPSY Left    no scar seen    BUNIONECTOMY Right    COLONOSCOPY     CYSTOSCOPY     HAMMER TOE SURGERY Left    "middle toe was broke; turned into hammertoe then repaired"   HAMMER TOE SURGERY Bilateral    "  both small toes"   PARATHYROIDECTOMY Right 01/31/2016   PARATHYROIDECTOMY Right 01/31/2016   Procedure: PARATHYROIDECTOMY ON RIGHT SIDE;  Surgeon: Serena Colonel, MD;  Location: [facility] OR;  Service: ENT;  Laterality: Right;   SALPINGOOPHORECTOMY Bilateral    THYROIDECTOMY N/A 01/31/2016   Procedure: TOTAL THYROIDECTOMY;  Surgeon: Serena Colonel, MD;  Location: [facility] OR;  Service: ENT;  Laterality: N/A;   TOE SURGERY     bunion   TONSILLECTOMY     as a child   TOTAL THYROIDECTOMY  01/31/2016   TUBAL LIGATION     30 yrs ago     Social History:   reports that she has never smoked. She has never used smokeless  tobacco. She reports that she does not currently use alcohol. She reports that she does not use drugs.  Family History  Problem Relation Age of Onset   Breast cancer Mother 35   Hypertension Mother    Cancer Mother    Hyperlipidemia Mother    Stroke Mother    Other Father        'vocal dysautonomia'   Breast cancer Sister 26   Thyroid disease Sister    Osteoarthritis Maternal Grandmother    Colon cancer Neg Hx      Medications:  Patient's Medications  New Prescriptions   No medications on file  Previous Medications   ACETAMINOPHEN (TYLENOL) 325 MG TABLET    Take 650 mg by mouth as needed.   BENZONATATE (TESSALON) 100 MG CAPSULE    Take 1 capsule (100 mg total) by mouth every 8 (eight) hours.   CALCIUM CARBONATE (OS-CAL) 600 MG TABS TABLET    Take 1 tablet (600 mg total) by mouth 2 (two) times daily.   CHOLECALCIFEROL (VITAMIN D3) 50 MCG (2000 UT) CAPSULE    Take 1 capsule (2,000 Units total) by mouth daily.   DOVATO 50-300 MG TABLET    TAKE 1 TABLET BY MOUTH EVERY DAY   IBUPROFEN (ADVIL) 800 MG TABLET    Take 1 tablet (800 mg total) by mouth 3 (three) times daily.   LEVOTHYROXINE (SYNTHROID) 50 MCG TABLET    Take 1 tablet (50 mcg total) by mouth as directed. 2 tabs on Saturday and Sunday and 1 rest of the week   MULTIPLE VITAMINS-MINERALS (MULTIVITAMIN ADULTS 50+ PO)    Take 1 tablet by mouth daily.   TRAMADOL (ULTRAM) 50 MG TABLET    Take 1 tablet (50 mg total) by mouth every 8 (eight) hours as needed.   UBROGEPANT (UBRELVY) 100 MG TABS    Take 100 mg by mouth as needed.  Modified Medications   Modified Medication Previous Medication   OLMESARTAN-HYDROCHLOROTHIAZIDE (BENICAR HCT) 20-12.5 MG TABLET olmesartan-hydrochlorothiazide (BENICAR HCT) 20-12.5 MG tablet      Take 1 tablet by mouth daily.    TAKE 1 TABLET BY MOUTH EVERY DAY   PRAVASTATIN (PRAVACHOL) 40 MG TABLET pravastatin (PRAVACHOL) 40 MG tablet      Take 1 tablet (40 mg total) by mouth daily.    Take 1 tablet (40 mg  total) by mouth daily.  Discontinued Medications   No medications on file     Physical Exam:  Vitals:   02/18/23 1322  BP: 120/76  Pulse: 73  Resp: 18  Temp: 97.8 F (36.6 C)  SpO2: 96%  Weight: 133 lb 9.6 oz (60.6 kg)  Height: 5\' 5"  (1.651 m)   Body mass index is 22.23 kg/m.  Wt Readings from Last 3 Encounters:  02/18/23 133 lb  9.6 oz (60.6 kg)  01/13/23 135 lb 6.4 oz (61.4 kg)  08/07/22 132 lb (59.9 kg)     Physical Exam Constitutional:      Appearance: Normal appearance.  Cardiovascular:     Rate and Rhythm: Normal rate and regular rhythm.     Pulses: Normal pulses.     Heart sounds: Normal heart sounds.  Pulmonary:     Effort: Pulmonary effort is normal.     Breath sounds: Normal breath sounds.  Musculoskeletal:        General: Deformity (Dupteryn's contractures in bilateral hands) present. No swelling.  Skin:    General: Skin is warm and dry.  Neurological:     Mental Status: She is alert and oriented to person, place, and time. Mental status is at baseline.  Psychiatric:        Behavior: Behavior normal.     Labs reviewed: Basic Metabolic Panel:  Recent Labs    08/08/22 1337 10/02/22 1447 12/09/22 1522 01/13/23 1532 02/09/23 1406  NA 142  --   --  145*  --   K 4.0  --   --  4.6  --   CL 104  --   --  104  --   CO2 30  --   --  26  --   GLUCOSE 74  --   --  72  --   BUN 12  --   --  18  --   CREATININE 0.95  --   --  1.06*  --   CALCIUM 10.0  --   --  10.4*  --   TSH  --  7.45* 4.91*  --  0.90   Liver Function Tests:  Recent Labs    08/08/22 1337 01/13/23 1532  AST 18 20  ALT 15 16  ALKPHOS  --  78  BILITOT 0.4 0.3  PROT 6.7 7.1  ALBUMIN  --  4.5   No results for input(s): "LIPASE", "AMYLASE" in the last 8760 hours. No results for input(s): "AMMONIA" in the last 8760 hours. CBC:  Recent Labs    08/08/22 1337 01/13/23 1532  WBC 4.4 4.7  NEUTROABS 1,826  --   HGB 12.7 13.2  HCT 37.7 40.1  MCV 94.5 97  PLT 222 249   Lipid Panel:   Recent Labs    01/13/23 1532  CHOL 238*  HDL 76  LDLCALC 147*  TRIG 86  CHOLHDL 3.1   TSH:  Recent Labs    10/02/22 1447 12/09/22 1522 02/09/23 1406  TSH 7.45* 4.91* 0.90   A1C:  Lab Results  Component Value Date   HGBA1C 6.0 (H) 05/30/2022     Assessment/Plan  1. Essential hypertension, benign (Primary) -Controlled, at goal, <140/90 -Continue Benicar - olmesartan-hydrochlorothiazide (BENICAR HCT) 20-12.5 MG tablet; Take 1 tablet by mouth daily.  Dispense: 90 tablet; Refill: 1 -Encouraged dietary modifications and physical activity as tolerated  2. Hyperlipidemia, unspecified hyperlipidemia type -Encouraged dietary modifications and physical activity as tolerated -Continue pravastatin - pravastatin (PRAVACHOL) 40 MG tablet; Take 1 tablet (40 mg total) by mouth daily.  Dispense: 90 tablet; Refill: 1  3. Postoperative hypothyroidism -Continue levothyroxine, managed by endocrinology and recent changes made; TSH now WNL  4. Hyperparathyroidism (HCC) - Complete Metabolic Panel with eGFR - PTH, Intact and Calcium -Continue care with endocrinology  5. Hyperglycemia -Encouraged dietary modifications and physical activity as tolerated - Hemoglobin A1c  6. Osteoarthritis of multiple joints, unspecified osteoarthritis type -Continue PRN tramadol, encouraged minimal use  7. Human immunodeficiency virus (HIV) disease (HCC) -Continue care with ID, reports for the first time in years she was "not undetectable" -Emphasized importance of medication compliance  8. Dupuytren's contracture of both hands -advanced, she has followed with ortho but she is unsure if she wants to have any other procedures. Continues lifestyle modifications   9. High risk medication use -Counseled on dependence risk, encouraged minimal use  - DRUG MONITORING, PANEL 6 WITH CONFIRMATION, URINE  10. Need for influenza vaccination - Flu Vaccine Trivalent High Dose (Fluad)   Return in about 3 months  (around 05/18/2023) for routine follow up, labs prior to visit.:  Rollen Sox, Haroldine Laws MSN-FNP Student -I personally was present during the history, physical exam and medical decision-making activities of this service and have verified that the service and findings are accurately documented in the student's note Darletta Noblett K. Biagio Borg [facility] [phone]

## 2023-02-19 ENCOUNTER — Encounter: Payer: Self-pay | Admitting: Nurse Practitioner

## 2023-02-19 LAB — COMPLETE METABOLIC PANEL WITH GFR
AG Ratio: 1.6 (calc) (ref 1.0–2.5)
ALT: 18 U/L (ref 6–29)
AST: 23 U/L (ref 10–35)
Albumin: 4.6 g/dL (ref 3.6–5.1)
Alkaline phosphatase (APISO): 67 U/L (ref 37–153)
BUN: 10 mg/dL (ref 7–25)
CO2: 32 mmol/L (ref 20–32)
Calcium: 10.5 mg/dL — ABNORMAL HIGH (ref 8.6–10.4)
Chloride: 101 mmol/L (ref 98–110)
Creat: 1.01 mg/dL (ref 0.50–1.05)
Globulin: 2.8 g/dL (ref 1.9–3.7)
Glucose, Bld: 86 mg/dL (ref 65–99)
Potassium: 3.9 mmol/L (ref 3.5–5.3)
Sodium: 141 mmol/L (ref 135–146)
Total Bilirubin: 0.5 mg/dL (ref 0.2–1.2)
Total Protein: 7.4 g/dL (ref 6.1–8.1)
eGFR: 61 mL/min/{1.73_m2} (ref >=60)

## 2023-02-19 LAB — HEMOGLOBIN A1C
Hgb A1c MFr Bld: 6.1 %{Hb} — ABNORMAL HIGH (ref <5.7)
Mean Plasma Glucose: 128 mg/dL
eAG (mmol/L): 7.1 mmol/L

## 2023-02-19 LAB — DRUG MONITORING, PANEL 6 WITH CONFIRMATION, URINE
6 Acetylmorphine: NEGATIVE ng/mL (ref <10)
Alcohol Metabolites: NEGATIVE ng/mL (ref <500)
Amphetamines: NEGATIVE ng/mL (ref <500)
Barbiturates: NEGATIVE ng/mL (ref <300)
Benzodiazepines: NEGATIVE ng/mL (ref <100)
Cocaine Metabolite: NEGATIVE ng/mL (ref <150)
Creatinine: 204.8 mg/dL (ref >=20.0)
Marijuana Metabolite: NEGATIVE ng/mL (ref <20)
Methadone Metabolite: NEGATIVE ng/mL (ref <100)
Opiates: NEGATIVE ng/mL (ref <100)
Oxidant: NEGATIVE ug/mL (ref <200)
Oxycodone: NEGATIVE ng/mL (ref <100)
Phencyclidine: NEGATIVE ng/mL (ref <25)
pH: 7.7 (ref 4.5–9.0)

## 2023-02-19 LAB — PTH, INTACT AND CALCIUM
Calcium: 10.5 mg/dL — ABNORMAL HIGH (ref 8.6–10.4)
PTH: 33 pg/mL (ref 16–77)

## 2023-02-19 LAB — DM TEMPLATE

## 2023-03-06 ENCOUNTER — Other Ambulatory Visit: Payer: Self-pay | Admitting: Nurse Practitioner

## 2023-03-06 ENCOUNTER — Encounter: Payer: Self-pay | Admitting: Nurse Practitioner

## 2023-03-06 ENCOUNTER — Ambulatory Visit: Payer: 59 | Admitting: Nurse Practitioner

## 2023-03-06 VITALS — BP 112/82 | HR 69 | Temp 97.1°F | Ht 65.0 in | Wt 132.0 lb

## 2023-03-06 DIAGNOSIS — E2839 Other primary ovarian failure: Secondary | ICD-10-CM | POA: Diagnosis not present

## 2023-03-06 DIAGNOSIS — M2550 Pain in unspecified joint: Secondary | ICD-10-CM

## 2023-03-06 DIAGNOSIS — Z0000 Encounter for general adult medical examination without abnormal findings: Secondary | ICD-10-CM | POA: Diagnosis not present

## 2023-03-06 NOTE — Telephone Encounter (Signed)
 Patient is requesting a refill of the following medications: Requested Prescriptions   Pending Prescriptions Disp Refills   traMADol (ULTRAM) 50 MG tablet 90 tablet 0    Sig: Take 1 tablet (50 mg total) by mouth every 8 (eight) hours as needed.    Date of last refill:02/06/2023  Refill amount: 90 tablets

## 2023-03-06 NOTE — Progress Notes (Signed)
 Subjective:   Laurie Gray is a 68 y.o. female who presents for Medicare Annual (Subsequent) preventive examination.  Visit Complete: In person   Cardiac Risk Factors include: advanced age (>2men, >40 women);hypertension;dyslipidemia;sedentary lifestyle     Objective:    Today's Vitals   03/06/23 1524 03/06/23 1532  BP: 112/82   Pulse: 69   Temp: (!) 97.1 F (36.2 C)   TempSrc: Temporal   SpO2: 98%   Weight: 132 lb (59.9 kg)   Height: 5\' 5"  (1.651 m)   PainSc:  3    Body mass index is 21.97 kg/m.     03/06/2023    3:20 PM 02/18/2023    1:19 PM 01/13/2023    3:00 PM 08/08/2022    1:07 PM 03/12/2022    3:43 PM 02/25/2022    8:44 AM 11/20/2021    3:07 PM  Advanced Directives  Does Patient Have a Medical Advance Directive?  Yes Yes No Yes Yes Yes  Type of Special educational needs teacher of [place];Living will Healthcare Power of [place];Living will  Healthcare Power of [place];Living will Healthcare Power of [place];Living will Healthcare Power of [place];Living will  Does patient want to make changes to medical advance directive?  No - Patient declined No - Patient declined   No - Patient declined No - Patient declined  Copy of Healthcare Power of Attorney in Chart? Yes - validated most recent copy scanned in chart (See row information) No - copy requested No - copy requested  No - copy requested No - copy requested No - copy requested  Would patient like information on creating a medical advance directive?    Yes (MAU/Ambulatory/Procedural Areas - Information given)       Current Medications (verified) Outpatient Encounter Medications as of 03/06/2023  Medication Sig   acetaminophen (TYLENOL) 325 MG tablet Take 650 mg by mouth as needed.   calcium carbonate (OS-CAL) 600 MG TABS tablet Take 1 tablet (600 mg total) by mouth 2 (two) times daily.   Cholecalciferol (VITAMIN D3) 50 MCG (2000 UT) capsule Take 1 capsule (2,000 Units total) by mouth daily.    DOVATO 50-300 MG tablet TAKE 1 TABLET BY MOUTH EVERY DAY   ibuprofen (ADVIL) 800 MG tablet Take 1 tablet (800 mg total) by mouth 3 (three) times daily.   levothyroxine (SYNTHROID) 50 MCG tablet Take 1 tablet (50 mcg total) by mouth as directed. 2 tabs on Saturday and Sunday and 1 rest of the week   Multiple Vitamins-Minerals (MULTIVITAMIN ADULTS 50+ PO) Take 1 tablet by mouth daily.   olmesartan-hydrochlorothiazide (BENICAR HCT) 20-12.5 MG tablet Take 1 tablet by mouth daily.   pravastatin (PRAVACHOL) 40 MG tablet Take 1 tablet (40 mg total) by mouth daily.   traMADol (ULTRAM) 50 MG tablet TAKE 1 TABLET BY MOUTH EVERY 8 HOURS AS NEEDED.   Ubrogepant (UBRELVY) 100 MG TABS Take 100 mg by mouth as needed.   [DISCONTINUED] benzonatate (TESSALON) 100 MG capsule Take 1 capsule (100 mg total) by mouth every 8 (eight) hours. (Patient not taking: Reported on 03/06/2023)   No facility-administered encounter medications on file as of 03/06/2023.    Allergies (verified) Crestor [rosuvastatin calcium], Pravastatin, and Sulfonamide derivatives   History: Past Medical History:  Diagnosis Date   Anemia    Anxiety    hx of-not on any meds   Arthritis    "shoulders, arms, fingers, neck, back" (01/31/2016)   Chronic cervical pain    Family history of breast cancer  History of bronchitis    History of colon polyps    History of kidney stones    HIV infection (HCC) dx'd 2000   takes odefsey   Hyperlipidemia    takes Pravastatin daily   Hypertension    takes Benicar HCT daily   Hyperthyroidism    Hypothyroidism    Internal hemorrhoids    Joint pain    Migraine    "in the past" (01/31/2016)   Parathyroid adenoma    Pituitary adenoma (HCC)    Refusal of blood transfusions as patient is Jehovah's Witness    "but substitutes are fine". (01/31/2016)   Thyroid cancer (HCC) 12/2015   Upper back pain    Past Surgical History:  Procedure Laterality Date   ABDOMINAL HYSTERECTOMY     BREAST EXCISIONAL  BIOPSY Left    no scar seen    BUNIONECTOMY Right    COLONOSCOPY     CYSTOSCOPY     HAMMER TOE SURGERY Left    "middle toe was broke; turned into hammertoe then repaired"   HAMMER TOE SURGERY Bilateral    "both small toes"   PARATHYROIDECTOMY Right 01/31/2016   PARATHYROIDECTOMY Right 01/31/2016   Procedure: PARATHYROIDECTOMY ON RIGHT SIDE;  Surgeon: Serena Colonel, MD;  [facility];  Service: ENT;  Laterality: Right;   SALPINGOOPHORECTOMY Bilateral    THYROIDECTOMY N/A 01/31/2016   Procedure: TOTAL THYROIDECTOMY;  Surgeon: Serena Colonel, MD;  Location: [facility] OR;  Service: ENT;  Laterality: N/A;   TOE SURGERY     bunion   TONSILLECTOMY     as a child   TOTAL THYROIDECTOMY  01/31/2016   TUBAL LIGATION     30 yrs ago   Family History  Problem Relation Age of Onset   Breast cancer Mother 89   Hypertension Mother    Cancer Mother    Hyperlipidemia Mother    Stroke Mother    Other Father        'vocal dysautonomia'   Breast cancer Sister 85   Thyroid disease Sister    Osteoarthritis Maternal Grandmother    Colon cancer Neg Hx    Social History   Socioeconomic History   Marital status: Widowed    Spouse name: Not on file   Number of children: Not on file   Years of education: Not on file   Highest education level: Some college, no degree  Occupational History   Not on file  Tobacco Use   Smoking status: Never   Smokeless tobacco: Never  Vaping Use   Vaping status: Never Used  Substance and Sexual Activity   Alcohol use: Not Currently    Alcohol/week: 0.0 standard drinks of alcohol    Comment: 01/31/2016 "might go out to eat and have a drink once/month; no more than that"   Drug use: No   Sexual activity: Not Currently    Partners: Male    Comment: declined condoms  Other Topics Concern   Not on file  Social History Narrative   Normal diet   Drinks Caffeine- coffee/coke/chocolate    Lives in an apartment/ third level/1 person/1 pet   Past profession- Market researcher,  cosmetologist   Exercises 3-4 x weekly (cardio and weights)    Right handed    Social Drivers of Health   Financial Resource Strain: Medium Risk (02/18/2023)   Overall Financial Resource Strain (CARDIA)    Difficulty of Paying Living Expenses: Somewhat hard  Food Insecurity: Food Insecurity Present (02/18/2023)   Hunger Vital Sign  Worried About Programme researcher, broadcasting/film/video in the Last Year: Sometimes true    [employer] of Food in the Last Year: Sometimes true  Transportation Needs: No Transportation Needs (02/18/2023)   PRAPARE - Administrator, Civil Service (Medical): No    Lack of Transportation (Non-Medical): No  Physical Activity: Insufficiently Active (02/18/2023)   Exercise Vital Sign    Days of Exercise per Week: 3 days    Minutes of Exercise per Session: 20 min  Stress: No Stress Concern Present (02/18/2023)   [employer] of Occupational Health - Occupational Stress Questionnaire    Feeling of Stress : Only a little  Social Connections: Moderately Integrated (02/18/2023)   Social Connection and Isolation Panel [NHANES]    Frequency of Communication with Friends and Family: More than three times a week    Frequency of Social Gatherings with Friends and Family: Once a week    Attends Religious Services: More than 4 times per year    Active Member of [employer] or Organizations: Yes    Attends Banker Meetings: More than 4 times per year    Marital Status: Widowed    Tobacco Counseling Counseling given: Not Answered   Clinical Intake:     Pain : 0-10 Pain Score: 3  Pain Type: Chronic pain Pain Location: Hand Pain Onset: More than a month ago Pain Frequency: Constant     BMI - recorded: 21.97 Nutritional Status: BMI of 19-24  Normal Nutritional Risks: None Diabetes: No  How often do you need to have someone help you when you read instructions, pamphlets, or other written materials from your doctor or pharmacy?: 1 - Never  Interpreter Needed?:  No      Activities of Daily Living    03/06/2023    3:29 PM 01/13/2023    2:52 PM  In your present state of health, do you have any difficulty performing the following activities:  Hearing? 0 0  Vision? 1 0  Difficulty concentrating or making decisions? 1 0  Comment sometimes concentrating   Walking or climbing stairs? 0 0  Dressing or bathing? 1 0  Doing errands, shopping? 0 0  Preparing Food and eating ? Y   Using the Toilet? N   In the past six months, have you accidently leaked urine? Y   Do you have problems with loss of bowel control? N   Managing your Medications? N   Managing your Finances? N   Housekeeping or managing your Housekeeping? N     Patient Care Team: Sharon Seller, NP as PCP - General (Geriatric Medicine) Ginnie Smart, MD as PCP - Infectious Diseases (Infectious Diseases) Avel Peace, MD as Consulting Physician (General Surgery)  Indicate any recent Medical Services you may have received from other than [facility] providers in the past year (date may be approximate).     Assessment:   This is a routine wellness examination for Jazmyn.  Hearing/Vision screen Hearing Screening - Comments:: No hearing issues  Vision Screening - Comments:: Last eye exam less than 12 months ago with Dr.Woods, [facility]    Goals Addressed   None    Depression Screen    03/06/2023    3:20 PM 02/18/2023    1:19 PM 01/13/2023    2:50 PM 02/25/2022    8:45 AM 11/20/2021    3:40 PM 09/25/2021    3:15 PM 02/19/2021    2:58 PM  PHQ 2/9 Scores  PHQ - 2 Score 0  2 2  2  0 0  PHQ- 9 Score  9 8  11     Exception Documentation    Other- indicate reason in comment box       Fall Risk    03/06/2023    2:45 PM 02/18/2023    1:19 PM 01/13/2023    2:49 PM 02/25/2022    8:44 AM 02/07/2022    1:10 PM  Fall Risk   Falls in the past year? 0 0 0 1 1  Number falls in past yr: 0 0 0 0 0  Injury with Fall? 0 0 0 0 0  Risk for fall due to : No Fall Risks  No Fall Risks No Fall  Risks No Fall Risks  Follow up Falls evaluation completed   Falls evaluation completed Falls evaluation completed    MEDICARE RISK AT HOME: Medicare Risk at Home Any stairs in or around the home?: Yes (Outside in the hall) If so, are there any without handrails?: Yes Home free of loose throw rugs in walkways, pet beds, electrical cords, etc?: Yes Adequate lighting in your home to reduce risk of falls?: Yes Life alert?: No Use of a cane, walker or w/c?: No Grab bars in the bathroom?: Yes Shower chair or bench in shower?: Yes Elevated toilet seat or a handicapped toilet?: No  TIMED UP AND GO:  Was the test performed?  No    Cognitive Function:    01/28/2018    3:30 PM 01/21/2016    1:03 PM  MMSE - Mini Mental State Exam  Orientation to time 5 5  Orientation to Place 5 5  Registration 3 3  Attention/ Calculation 5 5  Recall 3 3  Language- name 2 objects 2 2  Language- repeat 1 1  Language- follow 3 step command 3 3  Language- read & follow direction 1 1  Write a sentence 1 1  Copy design 1 1  Total score 30 30        03/06/2023    3:22 PM 02/25/2022   10:25 AM 02/19/2021    3:01 PM 02/06/2020    3:28 PM 02/02/2019    3:34 PM  6CIT Screen  What Year? 0 points 0 points 0 points 0 points 0 points  What month? 0 points 0 points 0 points 0 points 0 points  What time? 0 points 0 points 0 points 0 points 0 points  Count back from 20 0 points 0 points 0 points 0 points 0 points  Months in reverse 0 points 0 points 0 points 0 points 0 points  Repeat phrase 0 points 0 points 0 points 0 points 2 points  Total Score 0 points 0 points 0 points 0 points 2 points    Immunizations Immunization History  Administered Date(s) Administered   Fluad Quad(high Dose 65+) 03/14/2020, 09/23/2021   Fluad Trivalent(High Dose 65+) 02/18/2023   Hepatitis B, ADULT 06/09/2013, 07/11/2013, 12/05/2013   Influenza Split 09/08/2010, 11/10/2011   Influenza Whole 12/25/2008, 09/24/2009   Influenza,  High Dose Seasonal PF 09/04/2020   Influenza,inj,Quad PF,6+ Mos 10/11/2012, 11/02/2013, 11/08/2014, 10/22/2015, 09/24/2016, 10/13/2018, 09/25/2021   Influenza-Unspecified 10/15/2017   PFIZER(Purple Top)SARS-COV-2 Vaccination 03/12/2019, 04/15/2019, 10/30/2019   PNEUMOCOCCAL CONJUGATE-20 04/11/2021   Pneumococcal Conjugate-13 03/28/2014   Pneumococcal Polysaccharide-23 01/24/2010   Tdap 06/22/2020   Zoster Recombinant(Shingrix) 03/11/2018, 11/11/2018    TDAP status: Up to date  Flu Vaccine status: Up to date  Pneumococcal vaccine status: Up to date  Covid-19 vaccine status: Information provided  on how to obtain vaccines.   Qualifies for Shingles Vaccine? Yes   Zostavax completed No   Shingrix Completed?: Yes  Screening Tests Health Maintenance  Topic Date Due   COVID-19 Vaccine (4 - 2024-25 season) 09/07/2022   DEXA SCAN  07/24/2023   Medicare Annual Wellness (AWV)  03/05/2024   MAMMOGRAM  08/05/2024   Fecal DNA (Cologuard)  08/27/2025   DTaP/Tdap/Td (2 - Td or Tdap) 06/23/2030   Pneumonia Vaccine 82+ Years old  Completed   INFLUENZA VACCINE  Completed   Hepatitis C Screening  Completed   Zoster Vaccines- Shingrix  Completed   HPV VACCINES  Aged Out   Colonoscopy  Discontinued    Health Maintenance  Health Maintenance Due  Topic Date Due   COVID-19 Vaccine (4 - 2024-25 season) 09/07/2022    Colorectal cancer screening: Type of screening: Cologuard. Completed 2027. Repeat every 3 years  Mammogram status: Completed 2024. Repeat every year  Bone Density status: Ordered today. Pt provided with contact info and advised to call to schedule appt.  Lung Cancer Screening: (Low Dose CT Chest recommended if Age 36-80 years, 20 pack-year currently smoking OR have quit w/in 15years.) does not qualify.   Lung Cancer Screening Referral: na  Additional Screening:  Hepatitis C Screening: does qualify; Completed   Vision Screening: Recommended annual ophthalmology exams for  early detection of glaucoma and other disorders of the eye. Is the patient up to date with their annual eye exam?  Yes  Who is the provider or what is the name of the office in which the patient attends annual eye exams? [facility] If pt is not established with a provider, would they like to be referred to a provider to establish care? No .   Dental Screening: Recommended annual dental exams for proper oral hygiene   Community Resource Referral / Chronic Care Management: CRR required this visit?  No   CCM required this visit?  No     Plan:     I have personally reviewed and noted the following in the patient's chart:   Medical and social history Use of alcohol, tobacco or illicit drugs  Current medications and supplements including opioid prescriptions. Patient is currently taking opioid prescriptions. Information provided to patient regarding non-opioid alternatives. Patient advised to discuss non-opioid treatment plan with their provider. Functional ability and status Nutritional status Physical activity Advanced directives List of other physicians Hospitalizations, surgeries, and ER visits in previous 12 months Vitals Screenings to include cognitive, depression, and falls Referrals and appointments  In addition, I have reviewed and discussed with patient certain preventive protocols, quality metrics, and best practice recommendations. A written personalized care plan for preventive services as well as general preventive health recommendations were provided to patient.     Sharon Seller, NP   03/06/2023

## 2023-03-06 NOTE — Patient Instructions (Signed)
  Laurie Gray , Thank you for taking time to come for your Medicare Wellness Visit. I appreciate your ongoing commitment to your health goals. Please review the following plan we discussed and let me know if I can assist you in the future.   This is a list of the screening recommended for you and due dates:  Health Maintenance  Topic Date Due   COVID-19 Vaccine (4 - 2024-25 season) 09/07/2022   DEXA scan (bone density measurement)  07/24/2023   Medicare Annual Wellness Visit  03/05/2024   Mammogram  08/05/2024   Cologuard (Stool DNA test)  08/27/2025   DTaP/Tdap/Td vaccine (2 - Td or Tdap) 06/23/2030   Pneumonia Vaccine  Completed   Flu Shot  Completed   Hepatitis C Screening  Completed   Zoster (Shingles) Vaccine  Completed   HPV Vaccine  Aged Out   Colon Cancer Screening  Discontinued

## 2023-04-03 ENCOUNTER — Other Ambulatory Visit: Payer: Self-pay | Admitting: Nurse Practitioner

## 2023-04-03 DIAGNOSIS — M2550 Pain in unspecified joint: Secondary | ICD-10-CM

## 2023-04-03 MED ORDER — TRAMADOL HCL 50 MG PO TABS
50.0000 mg | ORAL_TABLET | Freq: Three times a day (TID) | ORAL | 0 refills | Status: DC | PRN
Start: 2023-04-03 — End: 2023-05-04

## 2023-04-03 MED ORDER — TRAMADOL HCL 50 MG PO TABS: 50.0000 mg | ORAL_TABLET | Freq: Three times a day (TID) | ORAL | 0 refills | Status: DC | PRN

## 2023-04-03 NOTE — Telephone Encounter (Signed)
 Please let patient know Rx has been sent in, also sent in another RX that can be picked up in 1 month she will just have to request this from the pharmacy.

## 2023-04-03 NOTE — Telephone Encounter (Signed)
 MyChart message sent to patient as form of communication.

## 2023-04-03 NOTE — Telephone Encounter (Signed)
 Patient has request refill on medication Tramadol. Patient medication refilled 03/06/2023. Patient has Contract on file dated 04/09/2021. Patient has upcoming appointment 05/18/2023. Update contract added to patient appointment notes. Medication pend and sent to PCP Janyth Contes Janene Harvey, NP for approval.

## 2023-05-02 ENCOUNTER — Other Ambulatory Visit: Payer: Self-pay | Admitting: Nurse Practitioner

## 2023-05-02 DIAGNOSIS — M2550 Pain in unspecified joint: Secondary | ICD-10-CM

## 2023-05-04 ENCOUNTER — Other Ambulatory Visit: Payer: Self-pay | Admitting: Nurse Practitioner

## 2023-05-04 DIAGNOSIS — M2550 Pain in unspecified joint: Secondary | ICD-10-CM

## 2023-05-04 NOTE — Telephone Encounter (Signed)
 Prescription was already sent in so she just needs to call pharmacy and request

## 2023-05-04 NOTE — Telephone Encounter (Signed)
 Pharmacy requested refill.  Epic LR: 04/03/2023 Contract Date: 08/07/2022  Pended Rx and sent to [facility] for approval.

## 2023-05-05 MED ORDER — TRAMADOL HCL 50 MG PO TABS: 50.0000 mg | ORAL_TABLET | Freq: Three times a day (TID) | ORAL | 0 refills | Status: DC | PRN

## 2023-05-05 NOTE — Telephone Encounter (Signed)
 I sent in a prescription last month for this month, can you call the pharmacy and see if they have it on file please?

## 2023-05-05 NOTE — Telephone Encounter (Signed)
  Patient comment: I received a 30 day supply of  Tramadol  March 28th (3 tablets daily), it is April 28th and I need a refill.  I get really sick when I go without.  Thank you.    Message routed to PCP Roselie Conger, Champ Coma, NP with patient message

## 2023-05-05 NOTE — Telephone Encounter (Signed)
 Copied from CRM [id number]. Topic: Clinical - Prescription Issue >> May 05, 2023  3:17 PM Blair Bumpers wrote: Reason for CRM: Patient called in wanting to speak with Dr. Roselie Conger. She states that Dr. Roselie Conger sent her a message via MyChart in regards to the traMADol  (ULTRAM ) 50 MG tablet prescription. Patient states she called the pharmacy, [facility], and they told her that they do not have a prescription on file for Tramadol . Patient would like for Dr. Roselie Conger to call in the prescription to the pharmacy directly. She states she needs for it to be filled as soon as possible because she have been out of it since yesterday. This is to [facility] to [facility] on [address]

## 2023-05-05 NOTE — Telephone Encounter (Signed)
Message routed to PCP Eubanks, Jessica K, NP  

## 2023-05-05 NOTE — Telephone Encounter (Signed)
 Patient called and notified.

## 2023-05-05 NOTE — Telephone Encounter (Signed)
 Please call her and let her know I sent in prescription

## 2023-05-14 ENCOUNTER — Ambulatory Visit: Payer: Self-pay

## 2023-05-14 ENCOUNTER — Other Ambulatory Visit: Payer: 59

## 2023-05-14 ENCOUNTER — Encounter: Payer: Self-pay | Admitting: Nurse Practitioner

## 2023-05-14 NOTE — Telephone Encounter (Signed)
Agreeable with plan

## 2023-05-14 NOTE — Telephone Encounter (Signed)
 Chief Complaint: diarrhea Symptoms: diarrhea, abdominal cramping, weakness Frequency: 4 days Pertinent Negatives: Patient denies CP, SOB, bloody stools, vomiting Disposition: [x] ED /[] Urgent Care (no appt availability in office) / [] Appointment(In office/virtual)/ []  [facility]/ [] Home Care/ [] Refused Recommended Disposition /[] [facility]/ []  Follow-up with PCP Additional Notes: Pt reports diarrhea and abdominal cramping for 4 days after she picked her grandson up from school (he was also ill and vomiting at the time). Pt denies vomiting. Denies bloody stools. States her stools are dark brown and very watery. Endorse very little PO intake. States she is trying to drink water  and remain hydrated but states she feels weak and dizzy. Pt has a lab appt today that she needed to cancel due to illness. RN cancelled that appt. RN advised pt she should go to the ED for evaluation of her symptoms. Pt agreeable, states she will have her husband bring her. RN advised pt if she develops CP, SOB, vomiting, worsening pain, bloody stools, worsening weakness, passing out she should call 911. Pt verbalized understanding. Pt also has an appt Monday to review labs, RN kept that office visit in case pt needs to be seen in the office for a HFU or symptoms etc.    Copied from CRM [id number]. Topic: Clinical - Red Word Triage >> May 14, 2023 11:59 AM Danelle Dunning F wrote: [facility] that prompted transfer to Nurse Triage:   Stomach virus; loose stool for four days; feeling weak; dehydrated; stomach cramping Reason for Disposition  Patient sounds very sick or weak to the triager  Answer Assessment - Initial Assessment Questions 1. DIARRHEA SEVERITY: "How bad is the diarrhea?" "How many more stools have you had in the past 24 hours than normal?"    - NO DIARRHEA (SCALE 0)   - MILD (SCALE 1-3): Few loose or mushy BMs; increase of 1-3 stools over normal daily number of stools; mild increase in ostomy  output.   -  MODERATE (SCALE 4-7): Increase of 4-6 stools daily over normal; moderate increase in ostomy output.   -  SEVERE (SCALE 8-10; OR "WORST POSSIBLE"): Increase of 7 or more stools daily over normal; moderate increase in ostomy output; incontinence.     "In the beginning 3 or more, today so far I have had diarrhea twice" 2. ONSET: "When did the diarrhea begin?"      4 days 3. BM CONSISTENCY: "How loose or watery is the diarrhea?"      Watery, dark brown (taking Pepto Bismol) 4. VOMITING: "Are you also vomiting?" If Yes, ask: "How many times in the past 24 hours?"      None  5. ABDOMEN PAIN: "Are you having any abdomen pain?" If Yes, ask: "What does it feel like?" (e.g., crampy, dull, intermittent, constant)      Abdominal cramping with BM - "it's bearable", "I think it's improving a little", 6/10 pain 6. ABDOMEN PAIN SEVERITY: If present, ask: "How bad is the pain?"  (e.g., Scale 1-10; mild, moderate, or severe)   - MILD (1-3): doesn't interfere with normal activities, abdomen soft and not tender to touch    - MODERATE (4-7): interferes with normal activities or awakens from sleep, abdomen tender to touch    - SEVERE (8-10): excruciating pain, doubled over, unable to do any normal activities       6/10 7. ORAL INTAKE: If vomiting, "Have you been able to drink liquids?" "How much liquids have you had in the past 24 hours?"  No vomiting, "I couldn't bear to eat, today I finally ate a little bread and a little rice. I'm drinking water  constantly." 8. HYDRATION: "Any signs of dehydration?" (e.g., dry mouth [not just dry lips], too weak to stand, dizziness, new weight loss) "When did you last urinate?"     "I'm probably a little dehydrated", endorses weakness and dizziness, "a little warm feeling like fever"; "I think I am urinating normally, I can't tell the color because it's mixed with liquid diarrhea" 9. EXPOSURE: "Have you traveled to a foreign country recently?" "Have you been  exposed to anyone with diarrhea?" "Could you have eaten any food that was spoiled?"     "I am pretty sure it isn't food poisoning, I think I caught it from my grandson, his mother called me and asked me if I could pick him up from school because he was vomiting", grandson is feeling better now  10. ANTIBIOTIC USE: "Are you taking antibiotics now or have you taken antibiotics in the past 2 months?"       No  11. OTHER SYMPTOMS: "Do you have any other symptoms?" (e.g., fever, blood in stool)       Weakness, dizziness, abdominal cramping, "warm like fever", denies bloody stool "just a dark brown, almost black, now it's more brownish color"  Protocols used: Diarrhea-A-AH

## 2023-05-14 NOTE — Telephone Encounter (Signed)
 See triage notes from triage Nurse.  Message sent to Verma Gobble, NP

## 2023-05-14 NOTE — Telephone Encounter (Signed)
 Spoke with patient and she stating that she had spoken with a Triage Nurse and that the nurse say that she need to keep the appointment Laurie Gobble, NP, however patient did stated that if she feels worse then she will go the ER and will us  updated.   Message sent to Laurie Gobble, NP

## 2023-05-14 NOTE — Telephone Encounter (Signed)
 Noted thank you

## 2023-05-18 ENCOUNTER — Ambulatory Visit: Payer: 59 | Admitting: Nurse Practitioner

## 2023-05-18 NOTE — Telephone Encounter (Signed)
 She does not need labs prior to appt, will discuss with her at appt

## 2023-05-18 NOTE — Telephone Encounter (Signed)
 Copied from CRM [id number]. Topic: Clinical - Request for Lab/Test Order >> Stephaney Steven 12, 2025 12:40 PM Danelle Dunning F wrote: Reason for CRM:   Patient called in and is rescheduled for her lab visit for tomorrow 05/19/2023 at 2:00pm. Patient did want to request all her routine labs be drawn at the appointment as well as her TSH labs due to feeling extremely fatigued the past month or so.   Callback Number: [phone]

## 2023-05-18 NOTE — Telephone Encounter (Signed)
 Patient notified and agreed.

## 2023-05-19 ENCOUNTER — Other Ambulatory Visit

## 2023-05-27 ENCOUNTER — Encounter: Payer: Self-pay | Admitting: Nurse Practitioner

## 2023-05-27 ENCOUNTER — Ambulatory Visit (INDEPENDENT_AMBULATORY_CARE_PROVIDER_SITE_OTHER): Admitting: Nurse Practitioner

## 2023-05-27 VITALS — BP 94/60 | HR 80 | Temp 97.8°F | Resp 21 | Ht 65.0 in | Wt 133.0 lb

## 2023-05-27 DIAGNOSIS — E890 Postprocedural hypothyroidism: Secondary | ICD-10-CM

## 2023-05-27 DIAGNOSIS — J383 Other diseases of vocal cords: Secondary | ICD-10-CM

## 2023-05-27 DIAGNOSIS — I10 Essential (primary) hypertension: Secondary | ICD-10-CM | POA: Diagnosis not present

## 2023-05-27 DIAGNOSIS — E213 Hyperparathyroidism, unspecified: Secondary | ICD-10-CM

## 2023-05-27 DIAGNOSIS — M159 Polyosteoarthritis, unspecified: Secondary | ICD-10-CM | POA: Diagnosis not present

## 2023-05-27 DIAGNOSIS — E785 Hyperlipidemia, unspecified: Secondary | ICD-10-CM

## 2023-05-27 MED ORDER — OLMESARTAN MEDOXOMIL 20 MG PO TABS
20.0000 mg | ORAL_TABLET | Freq: Every day | ORAL | 1 refills | Status: DC
Start: 2023-05-27 — End: 2023-07-20

## 2023-05-27 NOTE — Progress Notes (Unsigned)
 Careteam: Patient Care Team: Verma Gobble, NP as PCP - General (Geriatric Medicine) Sandie Cross, MD as PCP - Infectious Diseases (Infectious Diseases) Adalberto Hollow, MD as Consulting Physician (General Surgery)  PLACE OF SERVICE:  [facility]  Advanced Directive information    Allergies  Allergen Reactions   Crestor [Rosuvastatin Calcium ]     myalgias   Pravastatin      myalgias   Sulfonamide Derivatives Swelling    SWELLING REACTION UNSPECIFIED     Chief Complaint  Patient presents with   Follow-up    Patient has concerns about weakness    HPI:  Discussed the use of AI scribe software for clinical note transcription with the patient, who gave verbal consent to proceed.  History of Present Illness Laurie Gray is a 68 year old female who presents for a three-month follow-up.  She recently experienced an GI bug, likely contracted from her grandson, resulting in diarrhea for five days and a temporary weight loss of five pounds. Her weight has stabilized at 133 pounds, and she reports normal bowel movements and color.  She describes persistent lethargy, tiredness, and sleepiness, which have worsened over time. She feels the need to lie down after minimal activity and takes naps during the day. She has been on a thyroid  medication regimen of 50 mcg four days a week and 100 mcg on weekends for a couple of months. She also reports dizziness and faintness at times, despite adequate hydration with water  and electrolytes.  She mentions a sensation of 'extra lumps' in her throat and a shaky voice, especially after talking for extended periods. She has previously seen an ENT for this issue. She also mentions hair loss and a feeling of nervousness, although she denies being nervous/anxious.   She has a history of high cholesterol, which she believes is familial, as her mother also had high cholesterol. She is currently taking pravastatin  for cholesterol  management.  She has been taking a combination blood pressure medication that includes a diuretic and questions whether this might be contributing to her symptoms. She has been monitoring her blood pressure at home, which is typically around 120/80. Lower today.   She expresses feelings of depression and is unsure if it is related to her tiredness. She is hesitant to take additional medication for depression but acknowledges taking tramadol , which she dislikes due to headaches but helps control her pain.     Review of Systems:  Review of Systems  Constitutional:  Positive for malaise/fatigue. Negative for chills, fever and weight loss.  HENT:  Negative for sore throat and tinnitus.   Respiratory:  Negative for cough, sputum production and shortness of breath.   Cardiovascular:  Negative for chest pain, palpitations and leg swelling.  Gastrointestinal:  Negative for abdominal pain, constipation, diarrhea and heartburn.  Genitourinary:  Negative for dysuria, frequency and urgency.  Musculoskeletal:  Negative for back pain, falls, joint pain and myalgias.  Skin: Negative.   Neurological:  Positive for dizziness. Negative for headaches.  Psychiatric/Behavioral:  Negative for depression and memory loss. The patient does not have insomnia.     Past Medical History:  Diagnosis Date   Anemia    Anxiety    hx of-not on any meds   Arthritis    "shoulders, arms, fingers, neck, back" (01/31/2016)   Chronic cervical pain    Family history of breast cancer    History of bronchitis    History of colon polyps    History of  kidney stones    HIV infection (HCC) dx'd 2000   takes odefsey    Hyperlipidemia    takes Pravastatin  daily   Hypertension    takes Benicar  HCT daily   Hyperthyroidism    Hypothyroidism    Internal hemorrhoids    Joint pain    Migraine    "in the past" (01/31/2016)   Parathyroid  adenoma    Pituitary adenoma (HCC)    Refusal of blood transfusions as patient is Jehovah's  Witness    "but substitutes are fine". 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   Upper back pain    Past Surgical History:  Procedure Laterality Date   ABDOMINAL HYSTERECTOMY     BREAST EXCISIONAL BIOPSY Left    no scar seen    BUNIONECTOMY Right    COLONOSCOPY     CYSTOSCOPY     HAMMER TOE SURGERY Left    "middle toe was broke; turned into hammertoe then repaired"   HAMMER TOE SURGERY Bilateral    "both small toes"   PARATHYROIDECTOMY Right 01/31/2016   PARATHYROIDECTOMY Right 01/31/2016   Procedure: PARATHYROIDECTOMY ON RIGHT SIDE;  Surgeon: Janita Mellow, MD;  Location: [facility] OR;  Service: ENT;  Laterality: Right;   SALPINGOOPHORECTOMY Bilateral    THYROIDECTOMY N/A 01/31/2016   Procedure: TOTAL THYROIDECTOMY;  Surgeon: Janita Mellow, MD;  Location: [facility] OR;  Service: ENT;  Laterality: N/A;   TOE SURGERY     bunion   TONSILLECTOMY     as a child   TOTAL THYROIDECTOMY  01/31/2016   TUBAL LIGATION     30 yrs ago   Social History:   reports that she has never smoked. She has never used smokeless tobacco. She reports that she does not currently use alcohol. She reports that she does not use drugs.  Family History  Problem Relation Age of Onset   Breast cancer Mother 17   Hypertension Mother    Cancer Mother    Hyperlipidemia Mother    Stroke Mother    Other Father        'vocal dysautonomia'   Breast cancer Sister 72   Thyroid  disease Sister    Osteoarthritis Maternal Grandmother    Colon cancer Neg Hx     Medications: Patient's Medications  New Prescriptions   No medications on file  Previous Medications   ACETAMINOPHEN  (TYLENOL ) 325 MG TABLET    Take 650 mg by mouth as needed.   CALCIUM  CARBONATE (OS-CAL) 600 MG TABS TABLET    Take 1 tablet (600 mg total) by mouth 2 (two) times daily.   CHOLECALCIFEROL (VITAMIN D3) 50 MCG (2000 UT) CAPSULE    Take 1 capsule (2,000 Units total) by mouth daily.   DOVATO  50-300 MG TABLET    TAKE 1 TABLET BY MOUTH EVERY DAY   IBUPROFEN   (ADVIL ) 800 MG TABLET    Take 1 tablet (800 mg total) by mouth 3 (three) times daily.   LEVOTHYROXINE  (SYNTHROID ) 50 MCG TABLET    Take 1 tablet (50 mcg total) by mouth as directed. 2 tabs on Saturday and Sunday and 1 rest of the week   MULTIPLE VITAMINS-MINERALS (MULTIVITAMIN ADULTS 50+ PO)    Take 1 tablet by mouth daily.   OLMESARTAN -HYDROCHLOROTHIAZIDE  (BENICAR  HCT) 20-12.5 MG TABLET    Take 1 tablet by mouth daily.   PRAVASTATIN  (PRAVACHOL ) 40 MG TABLET    Take 1 tablet (40 mg total) by mouth daily.   TRAMADOL  (ULTRAM ) 50 MG TABLET    Take 1  tablet (50 mg total) by mouth every 8 (eight) hours as needed.   UBROGEPANT  (UBRELVY ) 100 MG TABS    Take 100 mg by mouth as needed.  Modified Medications   No medications on file  Discontinued Medications   No medications on file    Physical Exam:  Vitals:   05/27/23 1409  BP: 94/60  Pulse: 80  Resp: (!) 21  Temp: 97.8 F (36.6 C)  SpO2: 97%  Weight: 133 lb (60.3 kg)  Height: 5\' 5"  (1.651 m)   Body mass index is 22.13 kg/m. Wt Readings from Last 3 Encounters:  05/27/23 133 lb (60.3 kg)  03/06/23 132 lb (59.9 kg)  02/18/23 133 lb 9.6 oz (60.6 kg)    Physical Exam Constitutional:      General: She is not in acute distress.    Appearance: She is well-developed. She is not diaphoretic.  HENT:     Head: Normocephalic and atraumatic.     Mouth/Throat:     Pharynx: No oropharyngeal exudate.  Eyes:     Conjunctiva/sclera: Conjunctivae normal.     Pupils: Pupils are equal, round, and reactive to light.  Cardiovascular:     Rate and Rhythm: Normal rate and regular rhythm.     Heart sounds: Normal heart sounds.  Pulmonary:     Effort: Pulmonary effort is normal.     Breath sounds: Normal breath sounds.  Abdominal:     General: Bowel sounds are normal.     Palpations: Abdomen is soft.  Musculoskeletal:     Cervical back: Normal range of motion and neck supple.     Right lower leg: No edema.     Left lower leg: No edema.   Skin:    General: Skin is warm and dry.  Neurological:     Mental Status: She is alert.  Psychiatric:        Mood and Affect: Mood normal.     Labs reviewed: Basic Metabolic Panel: Recent Labs    08/08/22 1337 10/02/22 1447 12/09/22 1522 01/13/23 1532 02/09/23 1406 02/18/23 1425  NA 142  --   --  145*  --  141  K 4.0  --   --  4.6  --  3.9  CL 104  --   --  104  --  101  CO2 30  --   --  26  --  32  GLUCOSE 74  --   --  72  --  86  BUN 12  --   --  18  --  10  CREATININE 0.95  --   --  1.06*  --  1.01  CALCIUM  10.0  --   --  10.4*  --  10.5*  10.5*  TSH  --  7.45* 4.91*  --  0.90  --    Liver Function Tests: Recent Labs    08/08/22 1337 01/13/23 1532 02/18/23 1425  AST 18 20 23   ALT 15 16 18   ALKPHOS  --  78  --   BILITOT 0.4 0.3 0.5  PROT 6.7 7.1 7.4  ALBUMIN  --  4.5  --    No results for input(s): "LIPASE", "AMYLASE" in the last 8760 hours. No results for input(s): "AMMONIA" in the last 8760 hours. CBC: Recent Labs    08/08/22 1337 01/13/23 1532  WBC 4.4 4.7  NEUTROABS 1,826  --   HGB 12.7 13.2  HCT 37.7 40.1  MCV 94.5 97  PLT 222 249   Lipid Panel:  Recent Labs    01/13/23 1532  CHOL 238*  HDL 76  LDLCALC 147*  TRIG 86  CHOLHDL 3.1   TSH: Recent Labs    10/02/22 1447 12/09/22 1522 02/09/23 1406  TSH 7.45* 4.91* 0.90   A1C: Lab Results  Component Value Date   HGBA1C 6.1 (H) 02/18/2023     Assessment/Plan Assessment and Plan  1. Essential hypertension, benign (Primary) Low blood pressure, possibly due to antihypertensive regimen including a diuretic. Recent dehydration may have contributed. - Discontinue hydrochlorothiazide  component of blood pressure medication. - Prescribe plain olmesartan  (Benicar ). - Monitor blood pressure at home. - Follow up in 6 weeks or sooner if blood pressure is elevated. - CBC with Differential/Platelet - Complete Metabolic Panel with eGFR  2. Hyperlipidemia, unspecified hyperlipidemia  type Hyperlipidemia managed with pravastatin . Cholesterol levels have increased recently. - Recheck cholesterol levels today. - Lipid panel  3. Postoperative hypothyroidism Hypothyroidism managed with levothyroxine . Symptoms of fatigue and lethargy may be related to thyroid  function. - Check thyroid  function tests including free T4. - Discuss thyroid  management with endocrinologist at upcoming appointment - TSH - T4, Free  4. Spastic dysphonia Voice disturbance with shakiness and sensation of lumps in the throat. Previous ENT evaluation was without findings however now with new symptoms. - Refer to ENT for further evaluation of voice disturbance. - Ambulatory referral to ENT  5. Osteoarthritis of multiple joints, unspecified osteoarthritis type Ongoing continues on tramadol   6. Hyperparathyroidism (HCC) -followed by endocrinolgy  Return in about 6 weeks (around 07/08/2023) for blood pressure.  Amalee Olsen K. [facility] [phone]

## 2023-05-27 NOTE — Patient Instructions (Addendum)
 Stop your combination medication (benicar  with hydrochlorothiazide )   Pick up NEW medication PLAIN benicar   To check blood pressure 1 hour AFTER you have had your medication Make sure you have been sitting at least 5 mins  Record and let us  know.  Goal <140/90

## 2023-05-28 LAB — CBC WITH DIFFERENTIAL/PLATELET
Absolute Lymphocytes: 1818 {cells}/uL (ref 850–3900)
Absolute Monocytes: 369 {cells}/uL (ref 200–950)
Basophils Absolute: 41 {cells}/uL (ref 0–200)
Basophils Relative: 0.9 %
Eosinophils Absolute: 59 {cells}/uL (ref 15–500)
Eosinophils Relative: 1.3 %
HCT: 35.3 % (ref 35.0–45.0)
Hemoglobin: 11.8 g/dL (ref 11.7–15.5)
MCH: 31.9 pg (ref 27.0–33.0)
MCHC: 33.4 g/dL (ref 32.0–36.0)
MCV: 95.4 fL (ref 80.0–100.0)
MPV: 11.4 fL (ref 7.5–12.5)
Monocytes Relative: 8.2 %
Neutro Abs: 2214 {cells}/uL (ref 1500–7800)
Neutrophils Relative %: 49.2 %
Platelets: 251 10*3/uL (ref 140–400)
RBC: 3.7 10*6/uL — ABNORMAL LOW (ref 3.80–5.10)
RDW: 12.7 % (ref 11.0–15.0)
Total Lymphocyte: 40.4 %
WBC: 4.5 10*3/uL (ref 3.8–10.8)

## 2023-05-28 LAB — COMPLETE METABOLIC PANEL WITHOUT GFR
AG Ratio: 1.7 (calc) (ref 1.0–2.5)
ALT: 23 U/L (ref 6–29)
AST: 20 U/L (ref 10–35)
Albumin: 4.1 g/dL (ref 3.6–5.1)
Alkaline phosphatase (APISO): 66 U/L (ref 37–153)
BUN/Creatinine Ratio: 7 (calc) (ref 6–22)
BUN: 9 mg/dL (ref 7–25)
CO2: 30 mmol/L (ref 20–32)
Calcium: 9.7 mg/dL (ref 8.6–10.4)
Chloride: 103 mmol/L (ref 98–110)
Creat: 1.22 mg/dL — ABNORMAL HIGH (ref 0.50–1.05)
Globulin: 2.4 g/dL (ref 1.9–3.7)
Glucose, Bld: 106 mg/dL (ref 65–139)
Potassium: 4 mmol/L (ref 3.5–5.3)
Sodium: 141 mmol/L (ref 135–146)
Total Bilirubin: 0.4 mg/dL (ref 0.2–1.2)
Total Protein: 6.5 g/dL (ref 6.1–8.1)

## 2023-05-28 LAB — LIPID PANEL
Cholesterol: 214 mg/dL — ABNORMAL HIGH (ref <200)
HDL: 54 mg/dL (ref >=50)
LDL Cholesterol (Calc): 137 mg/dL — ABNORMAL HIGH
Non-HDL Cholesterol (Calc): 160 mg/dL — ABNORMAL HIGH (ref <130)
Total CHOL/HDL Ratio: 4 (calc) (ref <5.0)
Triglycerides: 114 mg/dL (ref <150)

## 2023-05-28 LAB — T4, FREE: Free T4: 1.1 ng/dL (ref 0.8–1.8)

## 2023-05-28 LAB — TSH: TSH: 3.48 m[IU]/L (ref 0.40–4.50)

## 2023-05-29 ENCOUNTER — Ambulatory Visit (INDEPENDENT_AMBULATORY_CARE_PROVIDER_SITE_OTHER): Payer: 59 | Admitting: Internal Medicine

## 2023-05-29 ENCOUNTER — Encounter: Payer: Self-pay | Admitting: Internal Medicine

## 2023-05-29 VITALS — BP 112/70 | HR 75 | Ht 65.0 in | Wt 133.0 lb

## 2023-05-29 DIAGNOSIS — Z85850 Personal history of malignant neoplasm of thyroid: Secondary | ICD-10-CM | POA: Diagnosis not present

## 2023-05-29 DIAGNOSIS — E890 Postprocedural hypothyroidism: Secondary | ICD-10-CM | POA: Diagnosis not present

## 2023-05-29 DIAGNOSIS — R7303 Prediabetes: Secondary | ICD-10-CM | POA: Diagnosis not present

## 2023-05-29 NOTE — Progress Notes (Unsigned)
 Name: Laurie Gray  MRN/ DOB: [id number], 11-Apr-1955    Age/ Sex: 68 y.o., female     PCP: Verma Gobble, NP   Reason for Endocrinology Evaluation: Postoperative hypothyroid/PTC     Initial Endocrinology Clinic Visit: 02/04/2017    PATIENT IDENTIFIER: Ms. Laurie Gray is a 68 y.o., female with a past medical history of hypothyroid, dyslipidemia, HTN. She has followed with [facility] since 02/04/2017 for consultative assistance with management of her postoperative hypothyroid/PTC.   HISTORICAL SUMMARY:    Pt returns for f/u stage 1 PTC, with this chronology: 1/18: thyroidectomy: PTC, 0.9 CM. - FOCAL MICROSCOPIC EXTRATHYROIDAL EXTENSION.  INVOLVES MARGIN.   3/18: RAI rx with thyrogen , 109 mCi 3/18: post-therapy scan: uptake in the thyroid  bed only.   1/19: TG=0.1 (Ab neg) 2/20: TG=0.1 (Ab neg) 10/20: TG=0.1 (Ab neg) 6/21  TG=0.1 (Ab neg) 8/22  TG=0.1 (Ab neg) 04/2021 TG=0.1 (Ab neg)   Pt also has pituitary cyst (dx'ed 2005, when MRI showed 3.5 x 6.5 mm cyst; f/u in 2020 was unchanged; pit function was normal in 2020).     Postop hypothyroidism: she takes synthroid  as rx'ed.  pt states she feels well in general.    She is S/P parathyroidectomy 2018 due to primary hyperparathyroidism with normalization of serum calcium   Sister with thyroid  goiter    Patient was followed by Dr. Washington Hacker from 2019 until 04/2021  SUBJECTIVE:    Today (05/29/2023):  Laurie Gray is here for postoperative hypothyroidism and hx  of PTC.  Pt has chronic dysphonia, but has noted pain with talking, she is scheduled to see ENT soon. She also feels she has a lump in her neck  She is recovering from gastritis that was maily noted with diarrhea at the time  Denies tremors   Levothyroxine  50 mcg , 2 Saturday and Sunday and 1 tablet rest of the week  MVI      HISTORY:  Past Medical History:  Past Medical History:  Diagnosis Date   Anemia    Anxiety     hx of-not on any meds   Arthritis    "shoulders, arms, fingers, neck, back" (01/31/2016)   Chronic cervical pain    Family history of breast cancer    History of bronchitis    History of colon polyps    History of kidney stones    HIV infection (HCC) dx'd 2000   takes odefsey    Hyperlipidemia    takes Pravastatin  daily   Hypertension    takes Benicar  HCT daily   Hyperthyroidism    Hypothyroidism    Internal hemorrhoids    Joint pain    Migraine    "in the past" (01/31/2016)   Parathyroid  adenoma    Pituitary adenoma (HCC)    Refusal of blood transfusions as patient is Jehovah's Witness    "but substitutes are fine". 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   Upper back pain    Past Surgical History:  Past Surgical History:  Procedure Laterality Date   ABDOMINAL HYSTERECTOMY     BREAST EXCISIONAL BIOPSY Left    no scar seen    BUNIONECTOMY Right    COLONOSCOPY     CYSTOSCOPY     HAMMER TOE SURGERY Left    "middle toe was broke; turned into hammertoe then repaired"   HAMMER TOE SURGERY Bilateral    "both small toes"   PARATHYROIDECTOMY Right 01/31/2016   PARATHYROIDECTOMY Right 01/31/2016   Procedure: PARATHYROIDECTOMY ON RIGHT  SIDE;  Surgeon: Janita Mellow, MD;  Location: [facility] OR;  Service: ENT;  Laterality: Right;   SALPINGOOPHORECTOMY Bilateral    THYROIDECTOMY N/A 01/31/2016   Procedure: TOTAL THYROIDECTOMY;  Surgeon: Janita Mellow, MD;  Location: [facility] OR;  Service: ENT;  Laterality: N/A;   TOE SURGERY     bunion   TONSILLECTOMY     as a child   TOTAL THYROIDECTOMY  01/31/2016   TUBAL LIGATION     30 yrs ago   Social History:  reports that she has never smoked. She has never used smokeless tobacco. She reports that she does not currently use alcohol. She reports that she does not use drugs. Family History:  Family History  Problem Relation Age of Onset   Breast cancer Mother 46   Hypertension Mother    Cancer Mother    Hyperlipidemia Mother    Stroke Mother     Other Father        'vocal dysautonomia'   Breast cancer Sister 31   Thyroid  disease Sister    Osteoarthritis Maternal Grandmother    Colon cancer Neg Hx      HOME MEDICATIONS: Allergies as of 05/29/2023       Reactions   Crestor [rosuvastatin Calcium ]    myalgias   Pravastatin     myalgias   Sulfonamide Derivatives Swelling   SWELLING REACTION UNSPECIFIED         Medication List        Accurate as of May 29, 2023  1:17 PM. If you have any questions, ask your nurse or doctor.          acetaminophen  325 MG tablet Commonly known as: TYLENOL  Take 650 mg by mouth as needed.   calcium  carbonate 600 MG Tabs tablet Commonly known as: OS-CAL Take 1 tablet (600 mg total) by mouth 2 (two) times daily.   Dovato  50-300 MG tablet Generic drug: dolutegravir -lamiVUDine  TAKE 1 TABLET BY MOUTH EVERY DAY   ibuprofen  800 MG tablet Commonly known as: ADVIL  Take 1 tablet (800 mg total) by mouth 3 (three) times daily.   levothyroxine  50 MCG tablet Commonly known as: SYNTHROID  Take 1 tablet (50 mcg total) by mouth as directed. 2 tabs on Saturday and Sunday and 1 rest of the week   MULTIVITAMIN ADULTS 50+ PO Take 1 tablet by mouth daily.   olmesartan  20 MG tablet Commonly known as: BENICAR  Take 1 tablet (20 mg total) by mouth daily.   pravastatin  40 MG tablet Commonly known as: PRAVACHOL  Take 1 tablet (40 mg total) by mouth daily.   traMADol  50 MG tablet Commonly known as: ULTRAM  Take 1 tablet (50 mg total) by mouth every 8 (eight) hours as needed.   Ubrelvy  100 MG Tabs Generic drug: Ubrogepant  Take 100 mg by mouth as needed.   Vitamin D3 50 MCG (2000 UT) capsule Take 1 capsule (2,000 Units total) by mouth daily.          OBJECTIVE:   PHYSICAL EXAM: VS: BP 112/70 (BP Location: Left Arm, Patient Position: Sitting, Cuff Size: Small)   Pulse 75   Ht 5\' 5"  (1.651 m)   Wt 133 lb (60.3 kg)   SpO2 95%   BMI 22.13 kg/m    EXAM: General: Pt appears well and  is in NAD  Neck: General: Supple without adenopathy. Thyroid : No goiter or nodules appreciated.  Lungs: Clear with good BS bilat   Heart: Auscultation: RRR.  Abdomen: soft, nontender  Extremities:  BL LE: No pretibial edema  Mental Status: Judgment, insight: Intact Orientation: Oriented to time, place, and person Mood and affect: No depression, anxiety, or agitation     DATA REVIEWED:     Latest Reference Range & Units 05/27/23 14:35  Sodium 135 - 146 mmol/L 141  Potassium 3.5 - 5.3 mmol/L 4.0  Chloride 98 - 110 mmol/L 103  CO2 20 - 32 mmol/L 30  Glucose 65 - 139 mg/dL 161  BUN 7 - 25 mg/dL 9  Creatinine 0.96 - 0.45 mg/dL 4.09 (H)  Calcium  8.6 - 10.4 mg/dL 9.7  BUN/Creatinine Ratio 6 - 22 (calc) 7  AG Ratio 1.0 - 2.5 (calc) 1.7  AST 10 - 35 U/L 20  ALT 6 - 29 U/L 23  Total Protein 6.1 - 8.1 g/dL 6.5  Total Bilirubin 0.2 - 1.2 mg/dL 0.4    Latest Reference Range & Units 02/18/23 14:25  Hemoglobin A1C <5.7 % of total Hgb 6.1 (H)     Latest Reference Range & Units 05/27/23 14:35  TSH 0.40 - 4.50 mIU/L 3.48  T4,Free(Direct) 0.8 - 1.8 ng/dL 1.1      Pathology report 01/31/2016 Specimen: Total thyroid . Procedure (including lymph node sampling if applicable): Total thyroidectomy. Specimen Integrity (intact/fragmented): Intact. Tumor focality: Unifocal. Dominant tumor: Maximum tumor size (cm): 0.9 cm Tumor laterality: Right lobe. Histologic type (including subtype and/or unique features as applicable): Papillary thyroid  carcinoma, classic/usual type. Tumor capsule: None. Extrathyroidal extension: Microscopic extension into peri-thyroidal soft tissue. Capsular invasion with degree of invasion if present: N/A. Margins: Focally positive. Lymphatic or vascular invasion: Not identified. Lymph nodes: N/A. TNM code: pT1a, pNX Non-neoplastic thyroid : Nodular hyperplasia. Comments: There is focal microscopic extrathyroidal extension in the area of the positive  margin   Old records , labs and images have been reviewed.   ASSESSMENT / PLAN / RECOMMENDATIONS:   Postoperative Hypothyroidism  -Patient with fatigue, otherwise euthyroid - Pt educated extensively on the correct way to take levothyroxine  (first thing in the morning with water , 30 minutes before eating or taking other medications). - Pt encouraged to double dose the following day if she were to miss a dose given long half-life of levothyroxine . -Patient will return for repeat TSH as we do not have a phlebotomist today  Medications  Continue levothyroxine  75 mcg daily    2. Pre-diabetes   -Most recent A1c 6.1% -Discussed importance of low-carb diet and avoiding sugar sweetened beverages -She is not on any medications   3. Hx PTC  -She is s/p total thyroidectomy in 2018 -She is s/p RAI in 2018 -Patient is currently at low risk for recurrence with TSH goal of 0.5-2.0 u IU/mL    Follow-up in 1 year    Signed electronically by: Natale Bail, MD  [facility] Endocrinology  [facility] [address] [place] [place], [place] [address] Phone: [phone] FAX: [phone]      CC: Verma Gobble, NP [address] [place] [address] Phone: [phone]  Fax: [phone]   Return to Endocrinology clinic as below: Future Appointments  Date Time Provider Department Center  07/13/2023  3:20 PM Verma Gobble, NP [facility] None  08/10/2023  3:20 PM [facility] DIAG TOMO 1 [facility]  08/10/2023  3:30 PM [facility] US  1 [facility]  03/11/2024  3:00 PM Verma Gobble, NP [facility] None

## 2023-06-01 ENCOUNTER — Ambulatory Visit: Payer: Self-pay | Admitting: Nurse Practitioner

## 2023-06-02 ENCOUNTER — Ambulatory Visit
Admission: RE | Admit: 2023-06-02 | Discharge: 2023-06-02 | Disposition: A | Discharge status: Home / Self Care | Source: Ambulatory Visit | Attending: Internal Medicine | Admitting: Internal Medicine

## 2023-06-02 DIAGNOSIS — Z85850 Personal history of malignant neoplasm of thyroid: Secondary | ICD-10-CM

## 2023-06-02 NOTE — Telephone Encounter (Signed)
 Message routed to PCP Janyth Contes, Janene Harvey, NP as Lorain Childes.

## 2023-06-05 ENCOUNTER — Ambulatory Visit: Payer: Self-pay | Admitting: Internal Medicine

## 2023-06-18 ENCOUNTER — Other Ambulatory Visit: Payer: Self-pay | Admitting: Nurse Practitioner

## 2023-06-18 DIAGNOSIS — I10 Essential (primary) hypertension: Secondary | ICD-10-CM

## 2023-06-18 NOTE — Telephone Encounter (Signed)
 Given 2 months supply because she needs to follow up to ensure dose and medication adequately controlling medication- will send in 90 day supply at follow up appt if bp is at goal.

## 2023-06-18 NOTE — Telephone Encounter (Signed)
 Noted

## 2023-06-18 NOTE — Telephone Encounter (Signed)
 Patient wants more of medication Olmesartan . Patient only give 2 month supply. Patient medication pend and sent to PCP Roselie Conger Jessica K, NP for approval.

## 2023-07-01 ENCOUNTER — Other Ambulatory Visit: Payer: Self-pay | Admitting: Nurse Practitioner

## 2023-07-01 ENCOUNTER — Encounter: Payer: Self-pay | Admitting: Nurse Practitioner

## 2023-07-01 DIAGNOSIS — M2550 Pain in unspecified joint: Secondary | ICD-10-CM

## 2023-07-01 NOTE — Telephone Encounter (Signed)
 Pharmacy requested refill.  Epic LR 05/05/2023 Contract Date: 08/07/2022  Pended Rx and sent to [facility] for approval.

## 2023-07-01 NOTE — Telephone Encounter (Signed)
 Message routed to PCP Caro, Harlene POUR, NP to reply to patient questions.

## 2023-07-13 ENCOUNTER — Ambulatory Visit (INDEPENDENT_AMBULATORY_CARE_PROVIDER_SITE_OTHER): Admitting: Nurse Practitioner

## 2023-07-13 VITALS — BP 118/64 | HR 71 | Temp 98.3°F | Ht 65.0 in | Wt 132.4 lb

## 2023-07-13 DIAGNOSIS — G4720 Circadian rhythm sleep disorder, unspecified type: Secondary | ICD-10-CM

## 2023-07-13 DIAGNOSIS — I10 Essential (primary) hypertension: Secondary | ICD-10-CM

## 2023-07-13 LAB — BASIC METABOLIC PANEL WITH GFR
BUN: 12 mg/dL (ref 7–25)
CO2: 27 mmol/L (ref 20–32)
Calcium: 9.5 mg/dL (ref 8.6–10.4)
Chloride: 104 mmol/L (ref 98–110)
Creat: 0.98 mg/dL (ref 0.50–1.05)
Glucose, Bld: 90 mg/dL (ref 65–99)
Potassium: 4 mmol/L (ref 3.5–5.3)
Sodium: 140 mmol/L (ref 135–146)
eGFR: 63 mL/min/1.73m2 (ref >=60)

## 2023-07-13 NOTE — Patient Instructions (Signed)
 Try melatonin 1 mg by mouth at bedtime to help your sleep wake cycle  Limit naps Set a later bedtime Do not hangout in the bed- only be in bed for sleep.  No lights

## 2023-07-13 NOTE — Progress Notes (Signed)
 Careteam: Patient Care Team: Laurie Harlene POUR, NP as PCP - General (Geriatric Medicine) Eben Reyes BROCKS, MD as PCP - Infectious Diseases (Infectious Diseases) Lily Boas, MD as Consulting Physician (General Surgery)  PLACE OF SERVICE:  [facility]  Advanced Directive information    Allergies  Allergen Reactions   Crestor [Rosuvastatin Calcium ]     myalgias   Pravastatin      myalgias   Sulfonamide Derivatives Swelling    SWELLING REACTION UNSPECIFIED     Chief Complaint  Patient presents with   Medical Management of Chronic Issues     6 weeks for blood pressure.    HPI:  Discussed the use of AI scribe software for clinical note transcription with the patient, who gave verbal consent to proceed.  History of Present Illness Laurie Gray is a 68 year old female with hypertension who presents for a blood pressure follow-up.  Her blood pressure has been stable since discontinuing the diuretic and switching to Benicar . She no longer experiences dizziness or lightheadedness, which were present when her blood pressure was low. She notes increased frequency of urination with less volume but denies any dysuria or symptoms of a urinary tract infection. She has not been monitoring her blood pressure at home but feels well overall.  She has difficulty falling asleep until 3 or 4 in the morning despite a quiet and dark environment, leading to daytime sleepiness. She attempts to wake up earlier but continues to struggle with sleep onset and maintenance. She denies stress or pain as contributing factors. She consumes chocolate, which may affect her sleep due to caffeine content, and has tried melatonin in the past but unsure of dose  She is concerned about her heart health, noting a low heart rate at home, around the 50s, but no recent chest pain or palpitations. Her pulse was normal during today's visit.   Review of Systems:  Review of Systems  Constitutional:   Negative for chills, fever and weight loss.  HENT:  Negative for tinnitus.   Respiratory:  Negative for cough, sputum production and shortness of breath.   Cardiovascular:  Negative for chest pain, palpitations and leg swelling.  Gastrointestinal:  Negative for abdominal pain, constipation, diarrhea and heartburn.  Genitourinary:  Negative for dysuria, frequency and urgency.  Musculoskeletal:  Negative for back pain, falls, joint pain and myalgias.  Skin: Negative.   Neurological:  Negative for dizziness and headaches.  Psychiatric/Behavioral:  Negative for depression and memory loss. The patient does not have insomnia.     Past Medical History:  Diagnosis Date   Anemia    Anxiety    hx of-not on any meds   Arthritis    shoulders, arms, fingers, neck, back (01/31/2016)   Chronic cervical pain    Family history of breast cancer    History of bronchitis    History of colon polyps    History of kidney stones    HIV infection (HCC) dx'd 2000   takes odefsey    Hyperlipidemia    takes Pravastatin  daily   Hypertension    takes Benicar  HCT daily   Hyperthyroidism    Hypothyroidism    Internal hemorrhoids    Joint pain    Migraine    in the past (01/31/2016)   Parathyroid  adenoma    Pituitary adenoma (HCC)    Refusal of blood transfusions as patient is Jehovah's Witness    but substitutes are fine. 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   Upper  back pain    Past Surgical History:  Procedure Laterality Date   ABDOMINAL HYSTERECTOMY     BREAST EXCISIONAL BIOPSY Left    no scar seen    BUNIONECTOMY Right    COLONOSCOPY     CYSTOSCOPY     HAMMER TOE SURGERY Left    middle toe was broke; turned into hammertoe then repaired   HAMMER TOE SURGERY Bilateral    both small toes   PARATHYROIDECTOMY Right 01/31/2016   PARATHYROIDECTOMY Right 01/31/2016   Procedure: PARATHYROIDECTOMY ON RIGHT SIDE;  Surgeon: Ida Loader, MD;  Location: [facility] OR;  Service: ENT;  Laterality:  Right;   SALPINGOOPHORECTOMY Bilateral    THYROIDECTOMY N/A 01/31/2016   Procedure: TOTAL THYROIDECTOMY;  Surgeon: Ida Loader, MD;  Location: [facility] OR;  Service: ENT;  Laterality: N/A;   TOE SURGERY     bunion   TONSILLECTOMY     as a child   TOTAL THYROIDECTOMY  01/31/2016   TUBAL LIGATION     30 yrs ago   Social History:   reports that she has never smoked. She has never used smokeless tobacco. She reports that she does not currently use alcohol. She reports that she does not use drugs.  Family History  Problem Relation Age of Onset   Breast cancer Mother 36   Hypertension Mother    Cancer Mother    Hyperlipidemia Mother    Stroke Mother    Other Father        'vocal dysautonomia'   Breast cancer Sister 83   Thyroid  disease Sister    Osteoarthritis Maternal Grandmother    Colon cancer Neg Hx     Medications: Patient's Medications  New Prescriptions   No medications on file  Previous Medications   ACETAMINOPHEN  (TYLENOL ) 325 MG TABLET    Take 650 mg by mouth as needed.   CALCIUM  CARBONATE (OS-CAL) 600 MG TABS TABLET    Take 1 tablet (600 mg total) by mouth 2 (two) times daily.   CHOLECALCIFEROL (VITAMIN D3) 50 MCG (2000 UT) CAPSULE    Take 1 capsule (2,000 Units total) by mouth daily.   DOVATO  50-300 MG TABLET    TAKE 1 TABLET BY MOUTH EVERY DAY   IBUPROFEN  (ADVIL ) 800 MG TABLET    Take 1 tablet (800 mg total) by mouth 3 (three) times daily.   LEVOTHYROXINE  (SYNTHROID ) 50 MCG TABLET    Take 1 tablet (50 mcg total) by mouth as directed. 2 tabs on Saturday and Sunday and 1 rest of the week   MULTIPLE VITAMINS-MINERALS (MULTIVITAMIN ADULTS 50+ PO)    Take 1 tablet by mouth daily.   OLMESARTAN  (BENICAR ) 20 MG TABLET    Take 1 tablet (20 mg total) by mouth daily.   PRAVASTATIN  (PRAVACHOL ) 40 MG TABLET    Take 1 tablet (40 mg total) by mouth daily.   TRAMADOL  (ULTRAM ) 50 MG TABLET    TAKE 1 TABLET BY MOUTH EVERY 8 HOURS AS NEEDED.   UBROGEPANT  (UBRELVY ) 100 MG TABS    Take 100 mg  by mouth as needed.  Modified Medications   No medications on file  Discontinued Medications   No medications on file    Physical Exam:  Vitals:   07/13/23 1508  BP: 118/64  Pulse: 71  Temp: 98.3 F (36.8 C)  SpO2: 99%  Weight: 132 lb 6.4 oz (60.1 kg)  Height: 5' 5 (1.651 m)   Body mass index is 22.03 kg/m. Wt Readings from Last 3 Encounters:  07/13/23 132 lb 6.4 oz (60.1 kg)  05/29/23 133 lb (60.3 kg)  05/27/23 133 lb (60.3 kg)    Physical Exam Constitutional:      General: She is not in acute distress.    Appearance: She is well-developed. She is not diaphoretic.  HENT:     Head: Normocephalic and atraumatic.     Mouth/Throat:     Pharynx: No oropharyngeal exudate.  Eyes:     Conjunctiva/sclera: Conjunctivae normal.     Pupils: Pupils are equal, round, and reactive to light.  Cardiovascular:     Rate and Rhythm: Normal rate and regular rhythm.     Heart sounds: Normal heart sounds.  Pulmonary:     Effort: Pulmonary effort is normal.     Breath sounds: Normal breath sounds.  Abdominal:     General: Bowel sounds are normal.     Palpations: Abdomen is soft.  Musculoskeletal:     Cervical back: Normal range of motion and neck supple.     Right lower leg: No edema.     Left lower leg: No edema.  Skin:    General: Skin is warm and dry.  Neurological:     Mental Status: She is alert.  Psychiatric:        Mood and Affect: Mood normal.     Labs reviewed: Basic Metabolic Panel: Recent Labs    12/09/22 1522 01/13/23 1532 02/09/23 1406 02/18/23 1425 05/27/23 1435  NA  --  145*  --  141 141  K  --  4.6  --  3.9 4.0  CL  --  104  --  101 103  CO2  --  26  --  32 30  GLUCOSE  --  72  --  86 106  BUN  --  18  --  10 9  CREATININE  --  1.06*  --  1.01 1.22*  CALCIUM   --  10.4*  --  10.5*  10.5* 9.7  TSH 4.91*  --  0.90  --  3.48   Liver Function Tests: Recent Labs    01/13/23 1532 02/18/23 1425 05/27/23 1435  AST 20 23 20   ALT 16 18 23    ALKPHOS 78  --   --   BILITOT 0.3 0.5 0.4  PROT 7.1 7.4 6.5  ALBUMIN 4.5  --   --    No results for input(s): LIPASE, AMYLASE in the last 8760 hours. No results for input(s): AMMONIA in the last 8760 hours. CBC: Recent Labs    08/08/22 1337 01/13/23 1532 05/27/23 1435  WBC 4.4 4.7 4.5  NEUTROABS 1,826  --  2,214  HGB 12.7 13.2 11.8  HCT 37.7 40.1 35.3  MCV 94.5 97 95.4  PLT 222 249 251   Lipid Panel: Recent Labs    01/13/23 1532 05/27/23 1435  CHOL 238* 214*  HDL 76 54  LDLCALC 147* 137*  TRIG 86 114  CHOLHDL 3.1 4.0   TSH: Recent Labs    12/09/22 1522 02/09/23 1406 05/27/23 1435  TSH 4.91* 0.90 3.48   A1C: Lab Results  Component Value Date   HGBA1C 6.1 (H) 02/18/2023     Assessment/Plan 1. Essential hypertension, benign (Primary) -improved on current regimen, will continue olmesartan  20 mg daily - Basic Metabolic Panel to follow up Cr - EKG 12-Lead- sinus bradycardia  2. Sleep-wake cycle disorder -discussed sleep hygiene, can take melatonin 1 mg at bedtime to help with sleep.   Return in about 3 months (around 10/13/2023) for  routine follow up.  Amalie Koran K. Laurie BODILY [facility] [phone]

## 2023-07-14 ENCOUNTER — Ambulatory Visit: Payer: Self-pay | Admitting: Nurse Practitioner

## 2023-07-19 ENCOUNTER — Other Ambulatory Visit: Payer: Self-pay | Admitting: Nurse Practitioner

## 2023-07-19 DIAGNOSIS — I10 Essential (primary) hypertension: Secondary | ICD-10-CM

## 2023-07-20 NOTE — Telephone Encounter (Signed)
 Patient has request refill on medication Olmesartan . Patient last refill was May 2025. Patient was given 30 tablets and 1 refill. Patient medication pend and sent to Greig Cluster, NP for approval of 90 day supply. Patient most recent AVS stated to follow up in 3 months.

## 2023-07-28 ENCOUNTER — Other Ambulatory Visit: Payer: Self-pay | Admitting: Nurse Practitioner

## 2023-07-28 DIAGNOSIS — M2550 Pain in unspecified joint: Secondary | ICD-10-CM

## 2023-07-28 MED ORDER — TRAMADOL HCL 50 MG PO TABS
50.0000 mg | ORAL_TABLET | Freq: Three times a day (TID) | ORAL | 0 refills | Status: DC | PRN
Start: 2023-07-28 — End: 2023-08-24

## 2023-07-28 NOTE — Telephone Encounter (Signed)
 Patient is requesting a refill of the following medications: Requested Prescriptions   Pending Prescriptions Disp Refills   traMADol  (ULTRAM ) 50 MG tablet 90 tablet 0    Sig: Take 1 tablet (50 mg total) by mouth every 8 (eight) hours as needed.    Date of last refill: 07/01/23  Refill amount: 90  Treatment agreement date: August 2024, notation made on pending appointment in October to update

## 2023-08-04 ENCOUNTER — Other Ambulatory Visit

## 2023-08-04 ENCOUNTER — Institutional Professional Consult (permissible substitution) (INDEPENDENT_AMBULATORY_CARE_PROVIDER_SITE_OTHER): Admitting: Otolaryngology

## 2023-08-10 ENCOUNTER — Other Ambulatory Visit: Payer: 59

## 2023-08-10 ENCOUNTER — Other Ambulatory Visit

## 2023-08-13 ENCOUNTER — Other Ambulatory Visit

## 2023-08-13 ENCOUNTER — Inpatient Hospital Stay
Admission: RE | Admit: 2023-08-13 | Discharge: 2023-08-13 | Discharge status: Home / Self Care | Source: Ambulatory Visit | Attending: Nurse Practitioner | Admitting: Nurse Practitioner

## 2023-08-13 ENCOUNTER — Ambulatory Visit
Admission: RE | Admit: 2023-08-13 | Discharge: 2023-08-13 | Disposition: A | Discharge status: Home / Self Care | Source: Ambulatory Visit | Attending: Nurse Practitioner | Admitting: Nurse Practitioner

## 2023-08-13 ENCOUNTER — Other Ambulatory Visit (HOSPITAL_COMMUNITY)
Admission: RE | Admit: 2023-08-13 | Discharge: 2023-08-13 | Disposition: A | Discharge status: Home / Self Care | Source: Ambulatory Visit | Attending: Infectious Diseases | Admitting: Infectious Diseases

## 2023-08-13 DIAGNOSIS — R928 Other abnormal and inconclusive findings on diagnostic imaging of breast: Secondary | ICD-10-CM | POA: Diagnosis not present

## 2023-08-13 DIAGNOSIS — N6311 Unspecified lump in the right breast, upper outer quadrant: Secondary | ICD-10-CM | POA: Diagnosis not present

## 2023-08-13 DIAGNOSIS — Z113 Encounter for screening for infections with a predominantly sexual mode of transmission: Secondary | ICD-10-CM

## 2023-08-13 DIAGNOSIS — Z79899 Other long term (current) drug therapy: Secondary | ICD-10-CM

## 2023-08-13 DIAGNOSIS — N6310 Unspecified lump in the right breast, unspecified quadrant: Secondary | ICD-10-CM

## 2023-08-13 DIAGNOSIS — B20 Human immunodeficiency virus [HIV] disease: Secondary | ICD-10-CM

## 2023-08-14 LAB — T-HELPER CELL (CD4) - (RCID CLINIC ONLY)
CD4 % Helper T Cell: 41 % (ref 33–65)
CD4 T Cell Abs: 578 /uL (ref 400–1790)

## 2023-08-14 LAB — URINE CYTOLOGY ANCILLARY ONLY
Chlamydia: NEGATIVE
Comment: NEGATIVE
Comment: NORMAL
Neisseria Gonorrhea: NEGATIVE

## 2023-08-18 LAB — CBC
Hematocrit: 38.1 % (ref 34.0–46.6)
Hemoglobin: 12.6 g/dL (ref 11.1–15.9)
MCH: 32.6 pg (ref 26.6–33.0)
MCHC: 33.1 g/dL (ref 31.5–35.7)
MCV: 98 fL — ABNORMAL HIGH (ref 79–97)
Platelets: 193 x10E3/uL (ref 150–450)
RBC: 3.87 x10E6/uL (ref 3.77–5.28)
RDW: 13.5 % (ref 11.7–15.4)
WBC: 3.8 x10E3/uL (ref 3.4–10.8)

## 2023-08-18 LAB — COMPREHENSIVE METABOLIC PANEL WITH GFR
ALT: 17 IU/L (ref 0–32)
AST: 24 IU/L (ref 0–40)
Albumin: 4.5 g/dL (ref 3.9–4.9)
Alkaline Phosphatase: 70 IU/L (ref 44–121)
BUN/Creatinine Ratio: 13 (ref 12–28)
BUN: 13 mg/dL (ref 8–27)
Bilirubin Total: 0.3 mg/dL (ref 0.0–1.2)
CO2: 24 mmol/L (ref 20–29)
Calcium: 9.8 mg/dL (ref 8.7–10.3)
Chloride: 107 mmol/L — ABNORMAL HIGH (ref 96–106)
Creatinine, Ser: 1.01 mg/dL — ABNORMAL HIGH (ref 0.57–1.00)
Globulin, Total: 2.5 g/dL (ref 1.5–4.5)
Glucose: 100 mg/dL — ABNORMAL HIGH (ref 70–99)
Potassium: 4.3 mmol/L (ref 3.5–5.2)
Sodium: 145 mmol/L — ABNORMAL HIGH (ref 134–144)
Total Protein: 7 g/dL (ref 6.0–8.5)
eGFR: 61 mL/min/1.73 (ref >59)

## 2023-08-18 LAB — LIPID PANEL
Chol/HDL Ratio: 2.9 ratio (ref 0.0–4.4)
Cholesterol, Total: 221 mg/dL — ABNORMAL HIGH (ref 100–199)
HDL: 75 mg/dL (ref >39)
LDL Chol Calc (NIH): 133 mg/dL — ABNORMAL HIGH (ref 0–99)
Triglycerides: 77 mg/dL (ref 0–149)
VLDL Cholesterol Cal: 13 mg/dL (ref 5–40)

## 2023-08-18 LAB — HIV-1 RNA QUANT-NO REFLEX-BLD: HIV-1 RNA Viral Load: <20 {copies}/mL

## 2023-08-18 LAB — RPR: RPR Ser Ql: NONREACTIVE

## 2023-08-24 ENCOUNTER — Other Ambulatory Visit: Payer: Self-pay | Admitting: Nurse Practitioner

## 2023-08-24 DIAGNOSIS — M2550 Pain in unspecified joint: Secondary | ICD-10-CM

## 2023-08-24 NOTE — Telephone Encounter (Signed)
 Patient is requesting a refill of the following medications: Requested Prescriptions   Pending Prescriptions Disp Refills   traMADol  (ULTRAM ) 50 MG tablet [Pharmacy Med Name: TRAMADOL  HCL 50 MG TABLET] 90 tablet 0    Sig: TAKE 1 TABLET BY MOUTH EVERY 8 HOURS AS NEEDED.    Date of last refill: 07/28/23  Refill amount: 90  Treatment agreement date: Notation made on pending appointment 10/30/23

## 2023-08-25 ENCOUNTER — Ambulatory Visit: Admitting: Infectious Diseases

## 2023-08-25 VITALS — BP 134/87 | HR 69 | Temp 98.4°F | Ht 65.0 in | Wt 129.4 lb

## 2023-08-25 DIAGNOSIS — M79641 Pain in right hand: Secondary | ICD-10-CM | POA: Diagnosis not present

## 2023-08-25 DIAGNOSIS — I10 Essential (primary) hypertension: Secondary | ICD-10-CM | POA: Diagnosis not present

## 2023-08-25 DIAGNOSIS — E213 Hyperparathyroidism, unspecified: Secondary | ICD-10-CM | POA: Diagnosis not present

## 2023-08-25 DIAGNOSIS — E039 Hypothyroidism, unspecified: Secondary | ICD-10-CM

## 2023-08-25 DIAGNOSIS — B20 Human immunodeficiency virus [HIV] disease: Secondary | ICD-10-CM

## 2023-08-25 DIAGNOSIS — F32A Depression, unspecified: Secondary | ICD-10-CM

## 2023-08-25 DIAGNOSIS — E890 Postprocedural hypothyroidism: Secondary | ICD-10-CM

## 2023-08-25 DIAGNOSIS — Z113 Encounter for screening for infections with a predominantly sexual mode of transmission: Secondary | ICD-10-CM

## 2023-08-25 DIAGNOSIS — M79642 Pain in left hand: Secondary | ICD-10-CM

## 2023-08-25 DIAGNOSIS — E785 Hyperlipidemia, unspecified: Secondary | ICD-10-CM | POA: Diagnosis not present

## 2023-08-25 DIAGNOSIS — K089 Disorder of teeth and supporting structures, unspecified: Secondary | ICD-10-CM

## 2023-08-25 DIAGNOSIS — F331 Major depressive disorder, recurrent, moderate: Secondary | ICD-10-CM

## 2023-08-25 NOTE — Assessment & Plan Note (Addendum)
 She is doing well Her labs are great Continue on dovato .  Rtc in 9 months with labs prior.  Repeat mammo next year She defers dental and counseling referrals. Will use her previous.  Encouraged her to get flu and covid vaccine when available.

## 2023-08-25 NOTE — Assessment & Plan Note (Signed)
 Offered to have her seen by our counselor and she defers.

## 2023-08-25 NOTE — Assessment & Plan Note (Signed)
 Asked her to reconsider seeing hand surgery for her contractures.

## 2023-08-25 NOTE — Assessment & Plan Note (Signed)
 Mildly elevated today Appreciate her PCP's f/u.

## 2023-08-25 NOTE — Assessment & Plan Note (Signed)
 Appreciate her f/u with endo

## 2023-08-25 NOTE — Assessment & Plan Note (Addendum)
 Appreciate endo f/u Last TSH normal.

## 2023-08-25 NOTE — Assessment & Plan Note (Signed)
 She continues on statin, consider changing to higher potency (atorvastatin or pitavistatin).

## 2023-08-25 NOTE — Progress Notes (Signed)
 Subjective:    Patient ID: Laurie Gray, female  DOB: 10/18/1955, 68 y.o.        MRN: [id number]   HPI 68 yo F with hx of dx 2000 HIV+, depression, hyperlipidemia, prev lap hysterecomy-BSO (2009). Previously on D4T monotherapy,  atripla --> odefsy-->dovato .  Had previous abn on colpo (SIL). Had hysterectomy many years ago (~ 25) then ovaries 15 yrs ago.  She had mammo 02-2017 which showed R cysts. Since normal mammos.     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  She also got radiocative Iodine Has had Endo f/u and her levothyroxine  was decreased at last visit to 50. Now trying 50ug for 5 days and 100ug for 2 days.     Also hx of dupteryn's contractures- she has been seen by hand surgery. Had L 4th digit release (ziaflex) which did not help.  She still has pain (and contractures) in all her digits. She feels like it is getting worse.  Does not currently want (multiple) surgery. She does not want the long recovery or that the results are not guaranteed.  Feels like her dupteryn's is getting worse and she is dropping things.  Can't put on lotion, interact with grandson, limiting her physical interactions. Is embarrassing and frustrating.  Prev played the flute, piano and saxaphone. Went to [employer] in [place].    Prev dx with osteoporosis via BMD. Due to this at her f/u in Sept 2023 she was changed to Dovato  (to eliminate tdf).  Has been eating bad food, sleeping a lot. Exercising less.  HTN was rx was changed to benicar  at last PCP visit.  She also has concerns about dental pain. Had dental films done.  She had mamo 08-2023: 1. Probably benign 0.6 cm RIGHT breast mass (11 o'clock 9 CMFN) demonstrates 1 year stability. It is favored to be a cluster of cysts. 2. No evidence of LEFT breast malignancy  Depressed.   She got a notice that her housing unit has lead water . She has been using bottled water  for years.  Has seen ophtho  for her blurred vision. Had cataracts.   HIV 1 RNA Quant  Date Value  04/11/2021 NOT DETECTED copies/mL  04/24/2020 Not Detected Copies/mL  07/25/2019 <20 NOT DETECTED copies/mL   HIV-1 RNA Viral Load (copies/mL)  Date Value  08/13/2023 <20  01/13/2023 620  11/07/2021 <20   CD4 T Cell Abs (/uL)  Date Value  08/13/2023 578  01/13/2023 758  11/07/2021 693     Health Maintenance  Topic Date Due   COVID-19 Vaccine (4 - 2024-25 season) 09/07/2022   DEXA SCAN  07/24/2023   INFLUENZA VACCINE  08/07/2023   Medicare Annual Wellness (AWV)  03/05/2024   MAMMOGRAM  08/12/2025   Fecal DNA (Cologuard)  08/27/2025   DTaP/Tdap/Td (2 - Td or Tdap) 06/23/2030   Pneumococcal Vaccine: 50+ Years  Completed   Hepatitis C Screening  Completed   Zoster Vaccines- Shingrix   Completed   HPV VACCINES  Aged Out   Meningococcal B Vaccine  Aged Out   Pneumococcal Vaccine  Discontinued   Hepatitis B Vaccines 19-59 Average Risk  Discontinued   Colonoscopy  Discontinued      Review of Systems  Constitutional:  Positive for malaise/fatigue. Negative for chills, fever and weight loss.  Eyes:  Positive for blurred vision.  Respiratory:  Negative for cough and shortness of breath.   Cardiovascular:  Negative for chest pain.  Gastrointestinal:  Negative  for constipation and diarrhea.  Genitourinary:  Negative for dysuria.  Musculoskeletal:  Positive for joint pain.  Neurological:  Negative for headaches.  Psychiatric/Behavioral:  The patient has insomnia.     Please see HPI. All other systems reviewed and negative.     Objective:  Physical Exam Vitals reviewed.  Constitutional:      General: She is not in acute distress.    Appearance: Normal appearance. She is not toxic-appearing.  HENT:     Mouth/Throat:     Mouth: Mucous membranes are moist.     Pharynx: No oropharyngeal exudate.  Eyes:     Extraocular Movements: Extraocular movements intact.     Pupils: Pupils are equal, round, and  reactive to light.  Cardiovascular:     Rate and Rhythm: Normal rate and regular rhythm.  Pulmonary:     Effort: Pulmonary effort is normal.     Breath sounds: Normal breath sounds.  Abdominal:     General: Bowel sounds are normal. There is no distension.     Palpations: Abdomen is soft.     Tenderness: There is no abdominal tenderness.  Musculoskeletal:     Right lower leg: No edema.     Left lower leg: No edema.     Comments: Multiple contractures in her fingers.   Skin:    General: Skin is warm and dry.  Neurological:     General: No focal deficit present.     Mental Status: She is alert.  Psychiatric:        Mood and Affect: Mood normal.            Assessment & Plan:

## 2023-08-25 NOTE — Assessment & Plan Note (Signed)
 She is has multiple concerns about her teeth. Offered to refer her to access dental, she defers.

## 2023-09-20 ENCOUNTER — Other Ambulatory Visit: Payer: Self-pay | Admitting: Nurse Practitioner

## 2023-09-20 DIAGNOSIS — M2550 Pain in unspecified joint: Secondary | ICD-10-CM

## 2023-09-21 NOTE — Telephone Encounter (Signed)
 Patient has request for refill on Tramadol . Patient last refill was 08/24/2023. Patient has contract on file dated 04/09/2021. Patient has upcoming appointment 10/30/2023. Updated contract/Sign contract was added to appointment notes. Medication pend and sent to PCP Arch, Jessica K, NP) for approval.

## 2023-10-22 ENCOUNTER — Other Ambulatory Visit: Payer: Self-pay | Admitting: Orthopedic Surgery

## 2023-10-22 ENCOUNTER — Other Ambulatory Visit: Payer: Self-pay | Admitting: Nurse Practitioner

## 2023-10-22 DIAGNOSIS — M2550 Pain in unspecified joint: Secondary | ICD-10-CM

## 2023-10-22 DIAGNOSIS — I10 Essential (primary) hypertension: Secondary | ICD-10-CM

## 2023-10-23 NOTE — Telephone Encounter (Signed)
 Patient needs new treatment agreement on file.

## 2023-10-24 ENCOUNTER — Other Ambulatory Visit: Payer: Self-pay | Admitting: Student

## 2023-10-24 ENCOUNTER — Encounter: Payer: Self-pay | Admitting: Infectious Diseases

## 2023-10-24 DIAGNOSIS — B20 Human immunodeficiency virus [HIV] disease: Secondary | ICD-10-CM

## 2023-10-24 MED ORDER — DOVATO 50-300 MG PO TABS: 1.0000 | ORAL_TABLET | Freq: Every day | ORAL | 0 refills | Status: DC

## 2023-10-24 NOTE — Progress Notes (Signed)
 Received call from on call pager about pt running out of her Dovato . She follows with Dr. Eben for infectious disease, however does not follow with the internal medicine center for primary care. I will give her a month supply of this medication, and loop in Dr. Eben as well for further refills on Monday.   Laurie Manuelito, MD

## 2023-10-25 ENCOUNTER — Other Ambulatory Visit: Payer: Self-pay | Admitting: Student

## 2023-10-25 DIAGNOSIS — B20 Human immunodeficiency virus [HIV] disease: Secondary | ICD-10-CM

## 2023-10-25 MED ORDER — DOVATO 50-300 MG PO TABS: 1.0000 | ORAL_TABLET | Freq: Every day | ORAL | 0 refills | Status: DC

## 2023-10-29 ENCOUNTER — Other Ambulatory Visit: Payer: Self-pay | Admitting: Infectious Diseases

## 2023-10-29 DIAGNOSIS — B20 Human immunodeficiency virus [HIV] disease: Secondary | ICD-10-CM

## 2023-10-29 MED ORDER — DOVATO 50-300 MG PO TABS: 1.0000 | ORAL_TABLET | Freq: Every day | ORAL | 3 refills | Status: AC

## 2023-10-30 ENCOUNTER — Ambulatory Visit: Admitting: Nurse Practitioner

## 2023-10-30 VITALS — BP 134/76 | HR 71 | Temp 97.7°F | Resp 20 | Ht 65.0 in | Wt 131.0 lb

## 2023-10-30 DIAGNOSIS — B20 Human immunodeficiency virus [HIV] disease: Secondary | ICD-10-CM

## 2023-10-30 DIAGNOSIS — Z23 Encounter for immunization: Secondary | ICD-10-CM | POA: Diagnosis not present

## 2023-10-30 DIAGNOSIS — E890 Postprocedural hypothyroidism: Secondary | ICD-10-CM | POA: Diagnosis not present

## 2023-10-30 DIAGNOSIS — E2839 Other primary ovarian failure: Secondary | ICD-10-CM

## 2023-10-30 DIAGNOSIS — M720 Palmar fascial fibromatosis [Dupuytren]: Secondary | ICD-10-CM | POA: Diagnosis not present

## 2023-10-30 DIAGNOSIS — I10 Essential (primary) hypertension: Secondary | ICD-10-CM | POA: Diagnosis not present

## 2023-10-30 DIAGNOSIS — F331 Major depressive disorder, recurrent, moderate: Secondary | ICD-10-CM

## 2023-10-30 MED ORDER — BUPROPION HCL ER (XL) 150 MG PO TB24: 150.0000 mg | ORAL_TABLET | Freq: Every day | ORAL | 1 refills | Status: DC

## 2023-10-30 NOTE — Patient Instructions (Addendum)
 1.Report to local pharmacy to receive Covid vaccine.  Vit b12 1000 mcg daily supplement

## 2023-10-30 NOTE — Progress Notes (Signed)
 Careteam: Patient Care Team: Caro Harlene POUR, NP as PCP - General (Geriatric Medicine) Eben Reyes BROCKS, MD as PCP - Infectious Diseases (Infectious Diseases) Lily Boas, MD as Consulting Physician (General Surgery)  PLACE OF SERVICE:  [facility]  Advanced Directive information Does Patient Have a Medical Advance Directive?: Yes, Type of Advance Directive: Healthcare Power of [place];Living will, Does patient want to make changes to medical advance directive?: No - Patient declined  Allergies  Allergen Reactions   Crestor [Rosuvastatin Calcium ]     myalgias   Pravastatin      myalgias   Sulfonamide Derivatives Swelling    SWELLING REACTION UNSPECIFIED     Chief Complaint  Patient presents with   Medical Management of Chronic Issues    3 month follow-up and sign treatment agreement for Tramadol  (pended). Discussed need for flu vaccine, covid boosters, and bone density (patient was contacted several times to schedule and will need a new order placed). [facility] verified     HPI:  Discussed the use of AI scribe software for clinical note transcription with the patient, who gave verbal consent to proceed.  History of Present Illness Laurie Gray is a 68 year old female who for routine follow up. Main concern is with fatigue and depression.  She experiences fatigue and lack of energy, attributing these symptoms to depression. She reports feeling depressed and sad, particularly due to recent events involving her children, including her son-in-law's car accident. No chest pain, pressure, or heart issues are reported.  She is currently taking tramadol  for pain management, which she notes also helps with her depression. She feels more depressed if she misses a dose. She recalls being prescribed Cymbalta  in the past but is hesitant to add another medication due to concerns about side effects, particularly those affecting sleep and causing weight changes.  She has a  cat named Bear, who she describes as a source of comfort and helps with her depression. She is considering getting another cat.  She denies thoughts of SI or HI  She has recently followed up with ID due to HIV. No changes in medication management.   She is due for her follow up bone density     Review of Systems:  Review of Systems  Constitutional:  Negative for chills, fever and weight loss.  HENT:  Negative for tinnitus.   Respiratory:  Negative for cough, sputum production and shortness of breath.   Cardiovascular:  Negative for chest pain, palpitations and leg swelling.  Gastrointestinal:  Negative for abdominal pain, constipation, diarrhea and heartburn.  Genitourinary:  Negative for dysuria, frequency and urgency.  Musculoskeletal:  Negative for back pain, falls, joint pain and myalgias.  Skin: Negative.   Neurological:  Negative for dizziness and headaches.  Psychiatric/Behavioral:  Positive for depression. Negative for memory loss. The patient does not have insomnia.     Past Medical History:  Diagnosis Date   Anemia    Anxiety    hx of-not on any meds   Arthritis    shoulders, arms, fingers, neck, back (01/31/2016)   Chronic cervical pain    Family history of breast cancer    History of bronchitis    History of colon polyps    History of kidney stones    HIV infection (HCC) dx'd 2000   takes odefsey    Hyperlipidemia    takes Pravastatin  daily   Hypertension    takes Benicar  HCT daily   Hyperthyroidism    Hypothyroidism  Internal hemorrhoids    Joint pain    Migraine    in the past (01/31/2016)   Parathyroid  adenoma    Pituitary adenoma (HCC)    Refusal of blood transfusions as patient is Jehovah's Witness    but substitutes are fine. 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   Upper back pain    Past Surgical History:  Procedure Laterality Date   ABDOMINAL HYSTERECTOMY     BREAST EXCISIONAL BIOPSY Left    no scar seen    BUNIONECTOMY Right     COLONOSCOPY     CYSTOSCOPY     HAMMER TOE SURGERY Left    middle toe was broke; turned into hammertoe then repaired   HAMMER TOE SURGERY Bilateral    both small toes   PARATHYROIDECTOMY Right 01/31/2016   PARATHYROIDECTOMY Right 01/31/2016   Procedure: PARATHYROIDECTOMY ON RIGHT SIDE;  Surgeon: Ida Loader, MD;  Location: [facility] OR;  Service: ENT;  Laterality: Right;   SALPINGOOPHORECTOMY Bilateral    THYROIDECTOMY N/A 01/31/2016   Procedure: TOTAL THYROIDECTOMY;  Surgeon: Ida Loader, MD;  Location: [facility] OR;  Service: ENT;  Laterality: N/A;   TOE SURGERY     bunion   TONSILLECTOMY     as a child   TOTAL THYROIDECTOMY  01/31/2016   TUBAL LIGATION     30 yrs ago   Social History:   reports that she has never smoked. She has never used smokeless tobacco. She reports that she does not currently use alcohol. She reports that she does not use drugs.  Family History  Problem Relation Age of Onset   Breast cancer Mother 31   Hypertension Mother    Cancer Mother    Hyperlipidemia Mother    Stroke Mother    Other Father        'vocal dysautonomia'   Breast cancer Sister 79   Thyroid  disease Sister    Osteoarthritis Maternal Grandmother    Colon cancer Neg Hx     Medications: Patient's Medications  New Prescriptions   No medications on file  Previous Medications   ACETAMINOPHEN  (TYLENOL ) 325 MG TABLET    Take 650 mg by mouth as needed.   CALCIUM  CARBONATE (OS-CAL) 600 MG TABS TABLET    Take 1 tablet (600 mg total) by mouth 2 (two) times daily.   CHOLECALCIFEROL (VITAMIN D3) 50 MCG (2000 UT) CAPSULE    Take 1 capsule (2,000 Units total) by mouth daily.   DOLUTEGRAVIR -LAMIVUDINE  (DOVATO ) 50-300 MG TABLET    Take 1 tablet by mouth daily.   IBUPROFEN  (ADVIL ) 800 MG TABLET    Take 1 tablet (800 mg total) by mouth 3 (three) times daily.   LEVOTHYROXINE  (SYNTHROID ) 50 MCG TABLET    Take 1 tablet (50 mcg total) by mouth as directed. 2 tabs on Saturday and Sunday and 1 rest of the week    MULTIPLE VITAMINS-MINERALS (MULTIVITAMIN ADULTS 50+ PO)    Take 1 tablet by mouth daily.   OLMESARTAN  (BENICAR ) 20 MG TABLET    TAKE 1 TABLET BY MOUTH EVERY DAY   PRAVASTATIN  (PRAVACHOL ) 40 MG TABLET    Take 1 tablet (40 mg total) by mouth daily.   TRAMADOL  (ULTRAM ) 50 MG TABLET    Take 1 tablet (50 mg total) by mouth every 8 (eight) hours as needed.   UBROGEPANT  (UBRELVY ) 100 MG TABS    Take 100 mg by mouth as needed.  Modified Medications   No medications on file  Discontinued Medications  No medications on file    Physical Exam:  Vitals:   10/30/23 1308  BP: 134/76  Pulse: 71  Resp: 20  Temp: 97.7 F (36.5 C)  SpO2: 98%  Weight: 131 lb (59.4 kg)  Height: 5' 5 (1.651 m)   Body mass index is 21.8 kg/m. Wt Readings from Last 3 Encounters:  10/30/23 131 lb (59.4 kg)  08/25/23 129 lb 6.4 oz (58.7 kg)  07/13/23 132 lb 6.4 oz (60.1 kg)    Physical Exam Constitutional:      General: She is not in acute distress.    Appearance: She is well-developed. She is not diaphoretic.  HENT:     Head: Normocephalic and atraumatic.     Mouth/Throat:     Pharynx: No oropharyngeal exudate.  Eyes:     Conjunctiva/sclera: Conjunctivae normal.     Pupils: Pupils are equal, round, and reactive to light.  Cardiovascular:     Rate and Rhythm: Normal rate and regular rhythm.     Heart sounds: Normal heart sounds.  Pulmonary:     Effort: Pulmonary effort is normal.     Breath sounds: Normal breath sounds.  Abdominal:     General: Bowel sounds are normal.     Palpations: Abdomen is soft.  Musculoskeletal:        General: No tenderness.     Cervical back: Normal range of motion and neck supple.     Comments: Contractures to bilateral hands with limited ROM to fingers bilaterally  Skin:    General: Skin is warm and dry.  Neurological:     Mental Status: She is alert and oriented to person, place, and time.     Labs reviewed: Basic Metabolic Panel: Recent Labs    12/09/22 1522  01/13/23 1532 02/09/23 1406 02/18/23 1425 05/27/23 1435 07/13/23 1608 08/13/23 1448  NA  --    < >  --    < > 141 140 145*  K  --    < >  --    < > 4.0 4.0 4.3  CL  --    < >  --    < > 103 104 107*  CO2  --    < >  --    < > 30 27 24   GLUCOSE  --    < >  --    < > 106 90 100*  BUN  --    < >  --    < > 9 12 13   CREATININE  --    < >  --    < > 1.22* 0.98 1.01*  CALCIUM   --    < >  --    < > 9.7 9.5 9.8  TSH 4.91*  --  0.90  --  3.48  --   --    < > = values in this interval not displayed.   Liver Function Tests: Recent Labs    01/13/23 1532 02/18/23 1425 05/27/23 1435 08/13/23 1448  AST 20 23 20 24   ALT 16 18 23 17   ALKPHOS 78  --   --  70  BILITOT 0.3 0.5 0.4 0.3  PROT 7.1 7.4 6.5 7.0  ALBUMIN 4.5  --   --  4.5   No results for input(s): LIPASE, AMYLASE in the last 8760 hours. No results for input(s): AMMONIA in the last 8760 hours. CBC: Recent Labs    01/13/23 1532 05/27/23 1435 08/13/23 1448  WBC 4.7 4.5 3.8  NEUTROABS  --  2,214  --   HGB 13.2 11.8 12.6  HCT 40.1 35.3 38.1  MCV 97 95.4 98*  PLT 249 251 193   Lipid Panel: Recent Labs    01/13/23 1532 05/27/23 1435 08/13/23 1448  CHOL 238* 214* 221*  HDL 76 54 75  LDLCALC 147* 137* 133*  TRIG 86 114 77  CHOLHDL 3.1 4.0 2.9   TSH: Recent Labs    12/09/22 1522 02/09/23 1406 05/27/23 1435  TSH 4.91* 0.90 3.48   A1C: Lab Results  Component Value Date   HGBA1C 6.1 (H) 02/18/2023     Assessment/Plan  Dupuytren's contracture of both hands Assessment & Plan: Progressive disease Limited mobility and use hands has effected her mood.  She has followed with orthopedics but unsure if she wants any additional intervention as this has not been successful in the past.    Immunization due -     Flu vaccine HIGH DOSE PF(Fluzone Trivalent)  Estrogen deficiency -     DG Bone Density; Future  Moderate episode of recurrent major depressive disorder [facility]) Assessment & Plan: -ongoing,  discussed therapy options but she does not wish to start this now Agreeable to start Wellbutrin daily to help with mood  Orders: -     buPROPion HCl ER (XL); Take 1 tablet (150 mg total) by mouth daily.  Dispense: 30 tablet; Refill: 1  Essential hypertension, benign Assessment & Plan: Well controlled today Continue current regimen Follow bmp   Postoperative hypothyroidism Assessment & Plan: TSH at goal in may. Continue current regimen    Human immunodeficiency virus (HIV) disease (HCC) Assessment & Plan: Continue ID follow up      Return in about 4 weeks (around 11/27/2023) for mood.  Kirklin Mcduffee K. Caro BODILY [facility] [phone]

## 2023-10-31 NOTE — Assessment & Plan Note (Signed)
 Well controlled today Continue current regimen Follow bmp

## 2023-10-31 NOTE — Assessment & Plan Note (Signed)
Continue ID follow up

## 2023-10-31 NOTE — Assessment & Plan Note (Signed)
 Progressive disease Limited mobility and use hands has effected her mood.  She has followed with orthopedics but unsure if she wants any additional intervention as this has not been successful in the past.

## 2023-10-31 NOTE — Assessment & Plan Note (Signed)
-  ongoing, discussed therapy options but she does not wish to start this now Agreeable to start Wellbutrin daily to help with mood

## 2023-10-31 NOTE — Assessment & Plan Note (Signed)
 TSH at goal in may. Continue current regimen

## 2023-11-02 ENCOUNTER — Other Ambulatory Visit: Payer: Self-pay | Admitting: Internal Medicine

## 2023-11-06 ENCOUNTER — Encounter: Payer: Self-pay | Admitting: Nurse Practitioner

## 2023-11-21 ENCOUNTER — Other Ambulatory Visit: Payer: Self-pay | Admitting: Nurse Practitioner

## 2023-11-21 DIAGNOSIS — F331 Major depressive disorder, recurrent, moderate: Secondary | ICD-10-CM

## 2023-11-23 ENCOUNTER — Other Ambulatory Visit: Payer: Self-pay | Admitting: Orthopedic Surgery

## 2023-11-23 ENCOUNTER — Ambulatory Visit: Admitting: Internal Medicine

## 2023-11-23 DIAGNOSIS — M2550 Pain in unspecified joint: Secondary | ICD-10-CM

## 2023-11-23 MED ORDER — TRAMADOL HCL 50 MG PO TABS: 50.0000 mg | ORAL_TABLET | Freq: Three times a day (TID) | ORAL | 0 refills | Status: AC | PRN

## 2023-11-23 NOTE — Progress Notes (Deleted)
 Name: Aili Casillas  MRN/ DOB: [id number], 07-18-1955    Age/ Sex: 68 y.o., female     PCP: Caro Harlene POUR, NP   Reason for Endocrinology Evaluation: Postoperative hypothyroid/PTC     Initial Endocrinology Clinic Visit: 02/04/2017    PATIENT IDENTIFIER: Ms. Marrisa Kimber is a 68 y.o., female with a past medical history of hypothyroid, dyslipidemia, HTN. She has followed with [facility] since 02/04/2017 for consultative assistance with management of her postoperative hypothyroid/PTC.   HISTORICAL SUMMARY:    Pt returns for f/u stage 1 PTC, with this chronology: 1/18: thyroidectomy: PTC, 0.9 CM. - FOCAL MICROSCOPIC EXTRATHYROIDAL EXTENSION.  INVOLVES MARGIN.   3/18: RAI rx with thyrogen , 109 mCi 3/18: post-therapy scan: uptake in the thyroid  bed only.   1/19: TG=0.1 (Ab neg) 2/20: TG=0.1 (Ab neg) 10/20: TG=0.1 (Ab neg) 6/21  TG=0.1 (Ab neg) 8/22  TG=0.1 (Ab neg) 04/2021 TG=0.1 (Ab neg)   Pt also has pituitary cyst (dx'ed 2005, when MRI showed 3.5 x 6.5 mm cyst; f/u in 2020 was unchanged; pit function was normal in 2020).     Postop hypothyroidism: she takes synthroid  as rx'ed.  pt states she feels well in general.    She is S/P parathyroidectomy 2018 due to primary hyperparathyroidism with normalization of serum calcium   Sister with thyroid  goiter    Patient was followed by Dr. Kassie from 2019 until 04/2021  SUBJECTIVE:    Today (11/23/2023):  Ms. Boudreau is here for postoperative hypothyroidism and hx  of PTC.  Pt has chronic dysphonia, but has noted pain with talking, she is scheduled to see ENT soon. She also feels she has a lump in her neck  She is recovering from gastritis that was maily noted with diarrhea at the time  Denies tremors   Levothyroxine  50 mcg , 2 Saturday and Sunday and 1 tablet rest of the week  MVI      HISTORY:  Past Medical History:  Past Medical History:  Diagnosis Date   Anemia    Anxiety     hx of-not on any meds   Arthritis    shoulders, arms, fingers, neck, back (01/31/2016)   Chronic cervical pain    Family history of breast cancer    History of bronchitis    History of colon polyps    History of kidney stones    HIV infection (HCC) dx'd 2000   takes odefsey    Hyperlipidemia    takes Pravastatin  daily   Hypertension    takes Benicar  HCT daily   Hyperthyroidism    Hypothyroidism    Internal hemorrhoids    Joint pain    Migraine    in the past (01/31/2016)   Parathyroid  adenoma    Pituitary adenoma (HCC)    Refusal of blood transfusions as patient is Jehovah's Witness    but substitutes are fine. 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   Upper back pain    Past Surgical History:  Past Surgical History:  Procedure Laterality Date   ABDOMINAL HYSTERECTOMY     BREAST EXCISIONAL BIOPSY Left    no scar seen    BUNIONECTOMY Right    COLONOSCOPY     CYSTOSCOPY     HAMMER TOE SURGERY Left    middle toe was broke; turned into hammertoe then repaired   HAMMER TOE SURGERY Bilateral    both small toes   PARATHYROIDECTOMY Right 01/31/2016   PARATHYROIDECTOMY Right 01/31/2016   Procedure: PARATHYROIDECTOMY ON RIGHT  SIDE;  Surgeon: Ida Loader, MD;  [facility] OR;  Service: ENT;  Laterality: Right;   SALPINGOOPHORECTOMY Bilateral    THYROIDECTOMY N/A 01/31/2016   Procedure: TOTAL THYROIDECTOMY;  Surgeon: Ida Loader, MD;  Location: [facility] OR;  Service: ENT;  Laterality: N/A;   TOE SURGERY     bunion   TONSILLECTOMY     as a child   TOTAL THYROIDECTOMY  01/31/2016   TUBAL LIGATION     30 yrs ago   Social History:  reports that she has never smoked. She has never used smokeless tobacco. She reports that she does not currently use alcohol. She reports that she does not use drugs. Family History:  Family History  Problem Relation Age of Onset   Breast cancer Mother 52   Hypertension Mother    Cancer Mother    Hyperlipidemia Mother    Stroke Mother     Other Father        'vocal dysautonomia'   Breast cancer Sister 33   Thyroid  disease Sister    Osteoarthritis Maternal Grandmother    Colon cancer Neg Hx      HOME MEDICATIONS: Allergies as of 11/23/2023       Reactions   Crestor [rosuvastatin Calcium ]    myalgias   Pravastatin     myalgias   Sulfonamide Derivatives Swelling   SWELLING REACTION UNSPECIFIED         Medication List        Accurate as of November 23, 2023 11:05 AM. If you have any questions, ask your nurse or doctor.          acetaminophen  325 MG tablet Commonly known as: TYLENOL  Take 650 mg by mouth as needed.   buPROPion 150 MG 24 hr tablet Commonly known as: WELLBUTRIN XL TAKE 1 TABLET BY MOUTH EVERY DAY   calcium  carbonate 600 MG Tabs tablet Commonly known as: OS-CAL Take 1 tablet (600 mg total) by mouth 2 (two) times daily.   Dovato  50-300 MG tablet Generic drug: dolutegravir -lamiVUDine  Take 1 tablet by mouth daily.   ibuprofen  800 MG tablet Commonly known as: ADVIL  Take 1 tablet (800 mg total) by mouth 3 (three) times daily. What changed:  when to take this reasons to take this   levothyroxine  50 MCG tablet Commonly known as: SYNTHROID  TAKE 1 TABLET BY MOUTH AS DIRECTED. 2 TABS ON SATURDAY AND SUNDAY AND 1 REST OF THE WEEK   MULTIVITAMIN ADULTS 50+ PO Take 1 tablet by mouth daily. What changed:  when to take this reasons to take this   olmesartan  20 MG tablet Commonly known as: BENICAR  TAKE 1 TABLET BY MOUTH EVERY DAY   pravastatin  40 MG tablet Commonly known as: PRAVACHOL  Take 1 tablet (40 mg total) by mouth daily.   traMADol  50 MG tablet Commonly known as: ULTRAM  Take 1 tablet (50 mg total) by mouth every 8 (eight) hours as needed.   Ubrelvy  100 MG Tabs Generic drug: Ubrogepant  Take 100 mg by mouth as needed.   Vitamin D3 50 MCG (2000 UT) capsule Take 1 capsule (2,000 Units total) by mouth daily.          OBJECTIVE:   PHYSICAL EXAM: VS: There were no  vitals taken for this visit.   EXAM: General: Pt appears well and is in NAD  Neck: General: Supple without adenopathy. Thyroid : No goiter or nodules appreciated.  Lungs: Clear with good BS bilat   Heart: Auscultation: RRR.  Abdomen: soft, nontender  Extremities:  BL LE:  No pretibial edema  Mental Status: Judgment, insight: Intact Orientation: Oriented to time, place, and person Mood and affect: No depression, anxiety, or agitation     DATA REVIEWED:     Latest Reference Range & Units 05/27/23 14:35  Sodium 135 - 146 mmol/L 141  Potassium 3.5 - 5.3 mmol/L 4.0  Chloride 98 - 110 mmol/L 103  CO2 20 - 32 mmol/L 30  Glucose 65 - 139 mg/dL 893  BUN 7 - 25 mg/dL 9  Creatinine 9.49 - 8.94 mg/dL 8.77 (H)  Calcium  8.6 - 10.4 mg/dL 9.7  BUN/Creatinine Ratio 6 - 22 (calc) 7  AG Ratio 1.0 - 2.5 (calc) 1.7  AST 10 - 35 U/L 20  ALT 6 - 29 U/L 23  Total Protein 6.1 - 8.1 g/dL 6.5  Total Bilirubin 0.2 - 1.2 mg/dL 0.4    Latest Reference Range & Units 02/18/23 14:25  Hemoglobin A1C <5.7 % of total Hgb 6.1 (H)     Latest Reference Range & Units 05/27/23 14:35  TSH 0.40 - 4.50 mIU/L 3.48  T4,Free(Direct) 0.8 - 1.8 ng/dL 1.1      Pathology report 01/31/2016 Specimen: Total thyroid . Procedure (including lymph node sampling if applicable): Total thyroidectomy. Specimen Integrity (intact/fragmented): Intact. Tumor focality: Unifocal. Dominant tumor: Maximum tumor size (cm): 0.9 cm Tumor laterality: Right lobe. Histologic type (including subtype and/or unique features as applicable): Papillary thyroid  carcinoma, classic/usual type. Tumor capsule: None. Extrathyroidal extension: Microscopic extension into peri-thyroidal soft tissue. Capsular invasion with degree of invasion if present: N/A. Margins: Focally positive. Lymphatic or vascular invasion: Not identified. Lymph nodes: N/A. TNM code: pT1a, pNX Non-neoplastic thyroid : Nodular hyperplasia. Comments: There is focal  microscopic extrathyroidal extension in the area of the positive margin   Old records , labs and images have been reviewed.   ASSESSMENT / PLAN / RECOMMENDATIONS:   Postoperative Hypothyroidism   - Patient is clinically euthyroid -TSH has trended up but remains within normal range, no changes at this time, but we will continue to monitor, if her latest TSH levels remain the same or higher we will increase levothyroxine , no changes at this time   Medications  Continue levothyroxine  75 mcg, 2 tabs on Sundays and 1 tab rest of the week      2. Pre-diabetes   -Most recent A1c 6.1% -Discussed importance of low-carb diet and avoiding sugar sweetened beverages -She is not on any medications   3. Hx PTC  -She is s/p total thyroidectomy in 2018 -She is s/p RAI in 2018 -Patient is currently at low risk for recurrence with TSH goal of 0.5-2.0 u IU/mL    Follow-up in 6 months     Signed electronically by: Stefano Redgie Butts, MD  [facility]  [facility] [address] [place], [place] [address] Phone: [phone] FAX: [phone]      CC: Caro Harlene POUR, NP [address] [place] [address] Phone: [phone]  Fax: [phone]   Return to Endocrinology clinic as below: Future Appointments  Date Time Provider Department Center  11/23/2023  2:40 PM Conrad Zajkowski, Donell Redgie, MD [facility] None  11/26/2023 10:40 AM Caro Harlene POUR, NP [facility] [address]  03/11/2024  2:00 PM Caro Harlene POUR, NP [facility] [address]

## 2023-11-26 ENCOUNTER — Encounter: Payer: Self-pay | Admitting: Nurse Practitioner

## 2023-11-26 ENCOUNTER — Telehealth (INDEPENDENT_AMBULATORY_CARE_PROVIDER_SITE_OTHER): Payer: Self-pay | Admitting: Nurse Practitioner

## 2023-11-26 DIAGNOSIS — F331 Major depressive disorder, recurrent, moderate: Secondary | ICD-10-CM

## 2023-11-26 DIAGNOSIS — M2550 Pain in unspecified joint: Secondary | ICD-10-CM

## 2023-11-26 NOTE — Progress Notes (Signed)
 This service is provided via telemedicine  No vital signs collected/recorded due to the encounter was a telemedicine visit.   Location of patient (ex: home, work):  Home  Patient consents to a telephone visit:  Yes  Location of the provider (ex: office, home):  Office [place]   Name of any referring provider:  na  Names of all persons participating in the telemedicine service and their role in the encounter:  Laurie Gray, Patient, Donzell Beal, CMA, Harlene An, NP  Time spent on call:  7:11

## 2023-11-26 NOTE — Progress Notes (Signed)
 Careteam: Patient Care Team: Caro Harlene POUR, NP as PCP - General (Geriatric Medicine) Eben Reyes BROCKS, MD as PCP - Infectious Diseases (Infectious Diseases) Lily Boas, MD as Consulting Physician (General Surgery)  Advanced Directive information    Allergies  Allergen Reactions   Crestor [Rosuvastatin Calcium ]     myalgias   Pravastatin      myalgias   Sulfonamide Derivatives Swelling    SWELLING REACTION UNSPECIFIED     Chief Complaint  Patient presents with   Medical Management of Chronic Issues    Medical Management of Chronic Issues. 4 week follow up Mood     HPI: Patient is a 68 y.o. female virtual follow up.  Discussed the use of AI scribe software for clinical note transcription with the patient, who gave verbal consent to proceed.  History of Present Illness Laurie Gray is a 68 year old female who presents for a follow-up regarding mood management after a medication change.  She was started on Wellbutrin but discontinued it shortly after due to intolerable side effects, including excessive sweating and worsening insomnia. These side effects were unmanageable, leading her to stop the medication.  Since discontinuing Wellbutrin, she has been focusing on lifestyle modifications to manage her mood, such as increasing sunlight exposure and exercising more. Despite these efforts, her mood has not significantly changed, although she is actively engaging in activities to improve her well-being. Her social support includes a strong church community with whom she maintains regular contact. She also practices gratitude journaling, which she finds beneficial for her mental health.  While on Wellbutrin, her pain management with tramadol  was less effective. After stopping Wellbutrin, tramadol  has resumed its effectiveness in managing her pain and also provides a mood-enhancing effect. She reports that tramadol  helps with her pain and also improves her  mood.     Review of Systems:  Review of Systems  Constitutional:  Negative for chills, fever and weight loss.  Musculoskeletal:  Positive for joint pain and myalgias.  Psychiatric/Behavioral:  Positive for depression. The patient is nervous/anxious.     Past Medical History:  Diagnosis Date   Anemia    Anxiety    hx of-not on any meds   Arthritis    shoulders, arms, fingers, neck, back (01/31/2016)   Chronic cervical pain    Family history of breast cancer    History of bronchitis    History of colon polyps    History of kidney stones    HIV infection (HCC) dx'd 2000   takes odefsey    Hyperlipidemia    takes Pravastatin  daily   Hypertension    takes Benicar  HCT daily   Hyperthyroidism    Hypothyroidism    Internal hemorrhoids    Joint pain    Migraine    in the past (01/31/2016)   Parathyroid  adenoma    Pituitary adenoma (HCC)    Refusal of blood transfusions as patient is Jehov[employer]'s Witness    but substitutes are fine. 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   Upper back pain    Past Surgical History:  Procedure Laterality Date   ABDOMINAL HYSTERECTOMY     BREAST EXCISIONAL BIOPSY Left    no scar seen    BUNIONECTOMY Right    COLONOSCOPY     CYSTOSCOPY     HAMMER TOE SURGERY Left    middle toe was broke; turned into hammertoe then repaired   HAMMER TOE SURGERY Bilateral    both small toes   PARATHYROIDECTOMY  Right 01/31/2016   PARATHYROIDECTOMY Right 01/31/2016   Procedure: PARATHYROIDECTOMY ON RIGHT SIDE;  Surgeon: Ida Loader, MD;  Location: [facility] OR;  Service: ENT;  Laterality: Right;   SALPINGOOPHORECTOMY Bilateral    THYROIDECTOMY N/A 01/31/2016   Procedure: TOTAL THYROIDECTOMY;  Surgeon: Ida Loader, MD;  Location: [facility] OR;  Service: ENT;  Laterality: N/A;   TOE SURGERY     bunion   TONSILLECTOMY     as a child   TOTAL THYROIDECTOMY  01/31/2016   TUBAL LIGATION     30 yrs ago   Social History:   reports that she has never smoked. She has  never used smokeless tobacco. She reports that she does not currently use alcohol. She reports that she does not use drugs.  Family History  Problem Relation Age of Onset   Breast cancer Mother 54   Hypertension Mother    Cancer Mother    Hyperlipidemia Mother    Stroke Mother    Other Father        'vocal dysautonomia'   Breast cancer Sister 86   Thyroid  disease Sister    Osteoarthritis Maternal Grandmother    Colon cancer Neg Hx     Medications: Patient's Medications  New Prescriptions   No medications on file  Previous Medications   ACETAMINOPHEN  (TYLENOL ) 325 MG TABLET    Take 650 mg by mouth as needed.   CALCIUM  CARBONATE (OS-CAL) 600 MG TABS TABLET    Take 1 tablet (600 mg total) by mouth 2 (two) times daily.   CHOLECALCIFEROL (VITAMIN D3) 50 MCG (2000 UT) CAPSULE    Take 1 capsule (2,000 Units total) by mouth daily.   DOLUTEGRAVIR -LAMIVUDINE  (DOVATO ) 50-300 MG TABLET    Take 1 tablet by mouth daily.   IBUPROFEN  (ADVIL ) 800 MG TABLET    Take 1 tablet (800 mg total) by mouth 3 (three) times daily.   LEVOTHYROXINE  (SYNTHROID ) 50 MCG TABLET    TAKE 1 TABLET BY MOUTH AS DIRECTED. 2 TABS ON SATURDAY AND SUNDAY AND 1 REST OF THE WEEK   MULTIPLE VITAMINS-MINERALS (MULTIVITAMIN ADULTS 50+ PO)    Take 1 tablet by mouth daily.   OLMESARTAN  (BENICAR ) 20 MG TABLET    TAKE 1 TABLET BY MOUTH EVERY DAY   PRAVASTATIN  (PRAVACHOL ) 40 MG TABLET    Take 1 tablet (40 mg total) by mouth daily.   TRAMADOL  (ULTRAM ) 50 MG TABLET    Take 1 tablet (50 mg total) by mouth every 8 (eight) hours as needed.   UBROGEPANT  (UBRELVY ) 100 MG TABS    Take 100 mg by mouth as needed.  Modified Medications   No medications on file  Discontinued Medications   No medications on file    Physical Exam:  There were no vitals filed for this visit. There is no height or weight on file to calculate BMI. Wt Readings from Last 3 Encounters:  10/30/23 131 lb (59.4 kg)  08/25/23 129 lb 6.4 oz (58.7 kg)  07/13/23 132  lb 6.4 oz (60.1 kg)    Physical Exam Constitutional:      Appearance: Normal appearance.  Pulmonary:     Effort: Pulmonary effort is normal.  Neurological:     Mental Status: She is alert. Mental status is at baseline.  Psychiatric:        Mood and Affect: Mood normal.     Labs reviewed: Basic Metabolic Panel: Recent Labs    12/09/22 1522 01/13/23 1532 02/09/23 1406 02/18/23 1425 05/27/23 1435 07/13/23 1608 08/13/23  1448  NA  --    < >  --    < > 141 140 145*  K  --    < >  --    < > 4.0 4.0 4.3  CL  --    < >  --    < > 103 104 107*  CO2  --    < >  --    < > 30 27 24   GLUCOSE  --    < >  --    < > 106 90 100*  BUN  --    < >  --    < > 9 12 13   CREATININE  --    < >  --    < > 1.22* 0.98 1.01*  CALCIUM   --    < >  --    < > 9.7 9.5 9.8  TSH 4.91*  --  0.90  --  3.48  --   --    < > = values in this interval not displayed.   Liver Function Tests: Recent Labs    01/13/23 1532 02/18/23 1425 05/27/23 1435 08/13/23 1448  AST 20 23 20 24   ALT 16 18 23 17   ALKPHOS 78  --   --  70  BILITOT 0.3 0.5 0.4 0.3  PROT 7.1 7.4 6.5 7.0  ALBUMIN 4.5  --   --  4.5   No results for input(s): LIPASE, AMYLASE in the last 8760 hours. No results for input(s): AMMONIA in the last 8760 hours. CBC: Recent Labs    01/13/23 1532 05/27/23 1435 08/13/23 1448  WBC 4.7 4.5 3.8  NEUTROABS  --  2,214  --   HGB 13.2 11.8 12.6  HCT 40.1 35.3 38.1  MCV 97 95.4 98*  PLT 249 251 193   Lipid Panel: Recent Labs    01/13/23 1532 05/27/23 1435 08/13/23 1448  CHOL 238* 214* 221*  HDL 76 54 75  LDLCALC 147* 137* 133*  TRIG 86 114 77  CHOLHDL 3.1 4.0 2.9   TSH: Recent Labs    12/09/22 1522 02/09/23 1406 05/27/23 1435  TSH 4.91* 0.90 3.48   A1C: Lab Results  Component Value Date   HGBA1C 6.1 (H) 02/18/2023     Assessment/Plan Assessment and Plan Assessment & Plan Depression Ongoing management with lifestyle modifications. Discontinued Wellbutrin  due to side  effects. Mood stable with non-pharmacological strategies. - Continue lifestyle modifications including increased sunlight exposure, exercise, and gratitude journaling. - Removed Wellbutrin  from medication list.  Chronic pain/arthralgias  Tramadol  effectively alleviates pain and improves mood. - Continue tramadol  for pain management.    Next appt: 4 month follow up.  Santez Woodcox K. Caro BODILY  [facility] [phone]    Virtual Visit via video  I connected with patient on 11/26/23 at 10:40 AM EST by mychart and verified that I am speaking with the correct person using two identifiers.  Location: Patient: home Provider: [facility]   I discussed the limitations, risks, security and privacy concerns of performing an evaluation and management service by telephone and the availability of in person appointments. I also discussed with the patient that there may be a patient responsible charge related to this service. The patient expressed understanding and agreed to proceed.   I discussed the assessment and treatment plan with the patient. The patient was provided an opportunity to ask questions and all were answered. The patient agreed with the plan and demonstrated an  understanding of the instructions.   The patient was advised to call back or seek an in-person evaluation if the symptoms worsen or if the condition fails to improve as anticipated.  I provided 15 minutes of non-face-to-face time during this encounter.  Bostyn Kunkler K. Caro BODILY Avs printed and mailed

## 2023-12-08 ENCOUNTER — Other Ambulatory Visit: Payer: Self-pay | Admitting: Nurse Practitioner

## 2023-12-08 DIAGNOSIS — E785 Hyperlipidemia, unspecified: Secondary | ICD-10-CM

## 2023-12-08 NOTE — Telephone Encounter (Signed)
 High Risk Warning Populated when attempting to refill, I will send to Provider for further review

## 2023-12-24 ENCOUNTER — Other Ambulatory Visit: Payer: Self-pay | Admitting: Orthopedic Surgery

## 2023-12-24 ENCOUNTER — Encounter: Payer: Self-pay | Admitting: Nurse Practitioner

## 2023-12-24 DIAGNOSIS — M2550 Pain in unspecified joint: Secondary | ICD-10-CM

## 2023-12-25 ENCOUNTER — Other Ambulatory Visit: Payer: Self-pay

## 2023-12-25 DIAGNOSIS — M2550 Pain in unspecified joint: Secondary | ICD-10-CM

## 2023-12-25 MED ORDER — TRAMADOL HCL 50 MG PO TABS: 50.0000 mg | ORAL_TABLET | Freq: Three times a day (TID) | ORAL | 0 refills | Status: DC | PRN

## 2023-12-25 NOTE — Telephone Encounter (Signed)
 Patient has request for refill on 12/25/23. Patient last refill was 11/23/23 Patient has contract on file dated 10/30/23. Patient has upcoming appointment 03/11/24-.  Medication pend and sent to PCP Arch, Jessica K, NP) for approval.

## 2023-12-25 NOTE — Addendum Note (Signed)
 Addended by: CARO HARLENE POUR on: 12/25/2023 10:51 AM   Modules accepted: Orders

## 2024-01-23 ENCOUNTER — Other Ambulatory Visit: Payer: Self-pay | Admitting: Nurse Practitioner

## 2024-01-23 DIAGNOSIS — M2550 Pain in unspecified joint: Secondary | ICD-10-CM

## 2024-01-25 NOTE — Telephone Encounter (Signed)
 Pharmacy requested refill Epic LR: 12/25/2023 Contract Date; 10/28/2023  Pended Rx and sent to [facility] for approval.

## 2024-03-11 ENCOUNTER — Ambulatory Visit: Payer: Self-pay | Admitting: Nurse Practitioner

## 2024-03-11 ENCOUNTER — Encounter: Payer: 59 | Admitting: Nurse Practitioner

## 2024-03-18 ENCOUNTER — Ambulatory Visit: Admitting: Nurse Practitioner
